# Patient Record
Sex: Male | Born: 1965 | Race: White | Hispanic: No | Marital: Single | Smoking: Former smoker
Health system: Southern US, Community
[De-identification: ages and names within clinical notes are randomized; demographics above are authoritative.]

## PROBLEM LIST (undated history)

## (undated) DIAGNOSIS — R569 Unspecified convulsions: Secondary | ICD-10-CM

## (undated) DIAGNOSIS — K259 Gastric ulcer, unspecified as acute or chronic, without hemorrhage or perforation: Secondary | ICD-10-CM

## (undated) DIAGNOSIS — F319 Bipolar disorder, unspecified: Secondary | ICD-10-CM

## (undated) DIAGNOSIS — F32A Depression, unspecified: Secondary | ICD-10-CM

## (undated) DIAGNOSIS — S0990XA Unspecified injury of head, initial encounter: Secondary | ICD-10-CM

## (undated) DIAGNOSIS — E46 Unspecified protein-calorie malnutrition: Secondary | ICD-10-CM

## (undated) DIAGNOSIS — N400 Enlarged prostate without lower urinary tract symptoms: Secondary | ICD-10-CM

## (undated) DIAGNOSIS — K279 Peptic ulcer, site unspecified, unspecified as acute or chronic, without hemorrhage or perforation: Secondary | ICD-10-CM

## (undated) DIAGNOSIS — F17200 Nicotine dependence, unspecified, uncomplicated: Secondary | ICD-10-CM

## (undated) DIAGNOSIS — F419 Anxiety disorder, unspecified: Secondary | ICD-10-CM

## (undated) DIAGNOSIS — F329 Major depressive disorder, single episode, unspecified: Secondary | ICD-10-CM

## (undated) DIAGNOSIS — F70 Mild intellectual disabilities: Secondary | ICD-10-CM

## (undated) DIAGNOSIS — F0789 Other personality and behavioral disorders due to known physiological condition: Secondary | ICD-10-CM

## (undated) DIAGNOSIS — K2210 Ulcer of esophagus without bleeding: Secondary | ICD-10-CM

## (undated) HISTORY — PX: GASTRECTOMY: SHX58

---
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
## 1997-10-03 ENCOUNTER — Emergency Department (HOSPITAL_COMMUNITY): Admission: EM | Admit: 1997-10-03 | Discharge: 1997-10-03 | Payer: Self-pay | Admitting: Emergency Medicine

## 1997-10-09 ENCOUNTER — Emergency Department (HOSPITAL_COMMUNITY): Admission: EM | Admit: 1997-10-09 | Discharge: 1997-10-09 | Payer: Self-pay | Admitting: *Deleted

## 1997-12-19 ENCOUNTER — Encounter: Admission: RE | Admit: 1997-12-19 | Discharge: 1998-03-19 | Payer: Self-pay | Admitting: Family Medicine

## 1999-12-05 ENCOUNTER — Encounter: Admission: RE | Admit: 1999-12-05 | Discharge: 1999-12-05 | Payer: Self-pay | Admitting: Neurology

## 1999-12-05 ENCOUNTER — Encounter: Payer: Self-pay | Admitting: Neurology

## 2001-03-01 ENCOUNTER — Emergency Department (HOSPITAL_COMMUNITY): Admission: EM | Admit: 2001-03-01 | Discharge: 2001-03-01 | Payer: Self-pay | Admitting: Emergency Medicine

## 2001-03-08 ENCOUNTER — Emergency Department (HOSPITAL_COMMUNITY): Admission: EM | Admit: 2001-03-08 | Discharge: 2001-03-08 | Payer: Self-pay | Admitting: *Deleted

## 2001-12-06 ENCOUNTER — Encounter: Payer: Self-pay | Admitting: Emergency Medicine

## 2001-12-06 ENCOUNTER — Emergency Department (HOSPITAL_COMMUNITY): Admission: EM | Admit: 2001-12-06 | Discharge: 2001-12-06 | Payer: Self-pay | Admitting: Emergency Medicine

## 2001-12-10 ENCOUNTER — Ambulatory Visit (HOSPITAL_COMMUNITY): Admission: RE | Admit: 2001-12-10 | Discharge: 2001-12-10 | Payer: Self-pay | Admitting: Plastic Surgery

## 2002-05-28 ENCOUNTER — Emergency Department (HOSPITAL_COMMUNITY): Admission: EM | Admit: 2002-05-28 | Discharge: 2002-05-28 | Payer: Self-pay | Admitting: Emergency Medicine

## 2002-05-28 ENCOUNTER — Encounter: Payer: Self-pay | Admitting: Emergency Medicine

## 2002-06-02 ENCOUNTER — Emergency Department (HOSPITAL_COMMUNITY): Admission: EM | Admit: 2002-06-02 | Discharge: 2002-06-02 | Payer: Self-pay | Admitting: Emergency Medicine

## 2004-05-13 ENCOUNTER — Ambulatory Visit: Payer: Self-pay | Admitting: Internal Medicine

## 2004-06-10 ENCOUNTER — Ambulatory Visit: Payer: Self-pay | Admitting: Internal Medicine

## 2004-08-13 ENCOUNTER — Ambulatory Visit: Payer: Self-pay | Admitting: Internal Medicine

## 2004-12-31 ENCOUNTER — Ambulatory Visit: Payer: Self-pay | Admitting: Internal Medicine

## 2005-02-14 ENCOUNTER — Ambulatory Visit: Payer: Self-pay | Admitting: Internal Medicine

## 2005-06-19 ENCOUNTER — Ambulatory Visit: Payer: Self-pay | Admitting: Internal Medicine

## 2005-06-19 DIAGNOSIS — Z8639 Personal history of other endocrine, nutritional and metabolic disease: Secondary | ICD-10-CM | POA: Insufficient documentation

## 2005-06-19 DIAGNOSIS — Z862 Personal history of diseases of the blood and blood-forming organs and certain disorders involving the immune mechanism: Secondary | ICD-10-CM

## 2005-07-06 ENCOUNTER — Emergency Department (HOSPITAL_COMMUNITY): Admission: EM | Admit: 2005-07-06 | Discharge: 2005-07-06 | Payer: Self-pay | Admitting: Emergency Medicine

## 2005-07-07 ENCOUNTER — Ambulatory Visit: Payer: Self-pay | Admitting: Internal Medicine

## 2005-07-07 ENCOUNTER — Inpatient Hospital Stay (HOSPITAL_COMMUNITY): Admission: EM | Admit: 2005-07-07 | Discharge: 2005-07-11 | Payer: Self-pay | Admitting: Emergency Medicine

## 2005-07-18 ENCOUNTER — Ambulatory Visit: Payer: Self-pay | Admitting: Internal Medicine

## 2005-07-30 ENCOUNTER — Ambulatory Visit: Payer: Self-pay | Admitting: Internal Medicine

## 2005-08-12 ENCOUNTER — Encounter: Admission: RE | Admit: 2005-08-12 | Discharge: 2005-11-10 | Payer: Self-pay | Admitting: Internal Medicine

## 2005-10-02 ENCOUNTER — Ambulatory Visit: Payer: Self-pay | Admitting: Internal Medicine

## 2005-10-16 ENCOUNTER — Ambulatory Visit (HOSPITAL_COMMUNITY): Admission: RE | Admit: 2005-10-16 | Discharge: 2005-10-16 | Payer: Self-pay | Admitting: Internal Medicine

## 2005-12-22 ENCOUNTER — Encounter: Admission: RE | Admit: 2005-12-22 | Discharge: 2006-01-20 | Payer: Self-pay | Admitting: Internal Medicine

## 2005-12-24 DIAGNOSIS — Z8719 Personal history of other diseases of the digestive system: Secondary | ICD-10-CM

## 2006-01-06 ENCOUNTER — Ambulatory Visit: Payer: Self-pay | Admitting: Internal Medicine

## 2006-01-09 ENCOUNTER — Ambulatory Visit: Payer: Self-pay | Admitting: Internal Medicine

## 2006-02-19 ENCOUNTER — Ambulatory Visit (HOSPITAL_COMMUNITY): Admission: RE | Admit: 2006-02-19 | Discharge: 2006-02-19 | Payer: Self-pay | Admitting: Internal Medicine

## 2006-03-23 ENCOUNTER — Emergency Department (HOSPITAL_COMMUNITY): Admission: EM | Admit: 2006-03-23 | Discharge: 2006-03-24 | Payer: Self-pay | Admitting: Emergency Medicine

## 2006-03-26 ENCOUNTER — Ambulatory Visit: Payer: Self-pay | Admitting: Internal Medicine

## 2006-06-29 ENCOUNTER — Ambulatory Visit (HOSPITAL_COMMUNITY): Admission: RE | Admit: 2006-06-29 | Discharge: 2006-06-29 | Payer: Self-pay | Admitting: Gastroenterology

## 2006-07-30 ENCOUNTER — Ambulatory Visit: Payer: Self-pay | Admitting: Internal Medicine

## 2006-08-05 ENCOUNTER — Ambulatory Visit (HOSPITAL_COMMUNITY): Admission: RE | Admit: 2006-08-05 | Discharge: 2006-08-05 | Payer: Self-pay | Admitting: Gastroenterology

## 2006-09-17 ENCOUNTER — Emergency Department (HOSPITAL_COMMUNITY): Admission: EM | Admit: 2006-09-17 | Discharge: 2006-09-17 | Payer: Self-pay | Admitting: Emergency Medicine

## 2006-09-21 DIAGNOSIS — S022XXA Fracture of nasal bones, initial encounter for closed fracture: Secondary | ICD-10-CM

## 2007-02-18 ENCOUNTER — Encounter (INDEPENDENT_AMBULATORY_CARE_PROVIDER_SITE_OTHER): Payer: Self-pay | Admitting: Family Medicine

## 2007-02-24 DIAGNOSIS — K210 Gastro-esophageal reflux disease with esophagitis: Secondary | ICD-10-CM

## 2007-03-26 ENCOUNTER — Ambulatory Visit: Payer: Self-pay | Admitting: Internal Medicine

## 2007-03-28 DIAGNOSIS — F329 Major depressive disorder, single episode, unspecified: Secondary | ICD-10-CM

## 2007-03-28 DIAGNOSIS — Z8711 Personal history of peptic ulcer disease: Secondary | ICD-10-CM

## 2007-03-28 DIAGNOSIS — H5010 Unspecified exotropia: Secondary | ICD-10-CM | POA: Insufficient documentation

## 2007-03-30 LAB — CONVERTED CEMR LAB
Albumin: 4 g/dL (ref 3.5–5.2)
Alkaline Phosphatase: 81 units/L (ref 39–117)
BUN: 21 mg/dL (ref 6–23)
CO2: 29 meq/L (ref 19–32)
Carbamazepine Lvl: 7.2 ug/mL (ref 4.0–12.0)
Eosinophils Absolute: 0.4 10*3/uL (ref 0.0–0.7)
Eosinophils Relative: 7 % — ABNORMAL HIGH (ref 0–5)
Glucose, Bld: 95 mg/dL (ref 70–99)
HCT: 45.4 % (ref 39.0–52.0)
Hemoglobin: 14.8 g/dL (ref 13.0–17.0)
Lymphocytes Relative: 39 % (ref 12–46)
Lymphs Abs: 2.5 10*3/uL (ref 0.7–3.3)
MCV: 97.8 fL (ref 78.0–100.0)
Monocytes Relative: 13 % — ABNORMAL HIGH (ref 3–11)
Potassium: 4.2 meq/L (ref 3.5–5.3)
RBC: 4.64 M/uL (ref 4.22–5.81)
Total Bilirubin: 0.2 mg/dL — ABNORMAL LOW (ref 0.3–1.2)
Valproic Acid Lvl: 48.4 ug/mL — ABNORMAL LOW (ref 50.0–100.0)
WBC: 6.3 10*3/uL (ref 4.0–10.5)

## 2007-04-02 ENCOUNTER — Telehealth (INDEPENDENT_AMBULATORY_CARE_PROVIDER_SITE_OTHER): Payer: Self-pay | Admitting: Internal Medicine

## 2007-04-02 DIAGNOSIS — R269 Unspecified abnormalities of gait and mobility: Secondary | ICD-10-CM | POA: Insufficient documentation

## 2007-04-08 ENCOUNTER — Encounter (INDEPENDENT_AMBULATORY_CARE_PROVIDER_SITE_OTHER): Payer: Self-pay | Admitting: Internal Medicine

## 2007-04-13 ENCOUNTER — Encounter: Payer: Self-pay | Admitting: Internal Medicine

## 2007-05-06 ENCOUNTER — Encounter (INDEPENDENT_AMBULATORY_CARE_PROVIDER_SITE_OTHER): Payer: Self-pay | Admitting: Internal Medicine

## 2007-06-25 ENCOUNTER — Encounter (INDEPENDENT_AMBULATORY_CARE_PROVIDER_SITE_OTHER): Payer: Self-pay | Admitting: Internal Medicine

## 2007-09-06 ENCOUNTER — Encounter (INDEPENDENT_AMBULATORY_CARE_PROVIDER_SITE_OTHER): Payer: Self-pay | Admitting: Internal Medicine

## 2007-09-07 ENCOUNTER — Emergency Department (HOSPITAL_COMMUNITY): Admission: EM | Admit: 2007-09-07 | Discharge: 2007-09-07 | Payer: Self-pay | Admitting: Family Medicine

## 2007-10-25 ENCOUNTER — Encounter (INDEPENDENT_AMBULATORY_CARE_PROVIDER_SITE_OTHER): Payer: Self-pay | Admitting: Internal Medicine

## 2007-10-26 ENCOUNTER — Emergency Department (HOSPITAL_COMMUNITY): Admission: EM | Admit: 2007-10-26 | Discharge: 2007-10-26 | Payer: Self-pay | Admitting: Emergency Medicine

## 2007-12-27 ENCOUNTER — Encounter (INDEPENDENT_AMBULATORY_CARE_PROVIDER_SITE_OTHER): Payer: Self-pay | Admitting: Internal Medicine

## 2008-01-07 ENCOUNTER — Encounter (INDEPENDENT_AMBULATORY_CARE_PROVIDER_SITE_OTHER): Payer: Self-pay | Admitting: Internal Medicine

## 2008-01-24 ENCOUNTER — Encounter (INDEPENDENT_AMBULATORY_CARE_PROVIDER_SITE_OTHER): Payer: Self-pay | Admitting: Internal Medicine

## 2008-02-25 ENCOUNTER — Encounter (INDEPENDENT_AMBULATORY_CARE_PROVIDER_SITE_OTHER): Payer: Self-pay | Admitting: Internal Medicine

## 2008-03-07 ENCOUNTER — Ambulatory Visit: Payer: Self-pay | Admitting: Internal Medicine

## 2008-03-07 DIAGNOSIS — M4000 Postural kyphosis, site unspecified: Secondary | ICD-10-CM | POA: Insufficient documentation

## 2008-03-07 DIAGNOSIS — B369 Superficial mycosis, unspecified: Secondary | ICD-10-CM | POA: Insufficient documentation

## 2008-03-07 DIAGNOSIS — N39498 Other specified urinary incontinence: Secondary | ICD-10-CM

## 2008-03-07 DIAGNOSIS — N401 Enlarged prostate with lower urinary tract symptoms: Secondary | ICD-10-CM

## 2008-03-07 LAB — CONVERTED CEMR LAB
Albumin: 3.9 g/dL (ref 3.5–5.2)
BUN: 13 mg/dL (ref 6–23)
Blood in Urine, dipstick: NEGATIVE
CO2: 28 meq/L (ref 19–32)
Calcium: 9.2 mg/dL (ref 8.4–10.5)
Chloride: 104 meq/L (ref 96–112)
Eosinophils Absolute: 0.3 10*3/uL (ref 0.0–0.7)
Glucose, Bld: 82 mg/dL (ref 70–99)
Glucose, Urine, Semiquant: NEGATIVE
Lymphocytes Relative: 35 % (ref 12–46)
Lymphs Abs: 2.3 10*3/uL (ref 0.7–4.0)
MCV: 100.9 fL — ABNORMAL HIGH (ref 78.0–100.0)
Monocytes Relative: 12 % (ref 3–12)
Neutro Abs: 3.2 10*3/uL (ref 1.7–7.7)
Neutrophils Relative %: 48 % (ref 43–77)
Nitrite: NEGATIVE
Platelets: 277 10*3/uL (ref 150–400)
Potassium: 4.9 meq/L (ref 3.5–5.3)
RBC: 4.29 M/uL (ref 4.22–5.81)
Sodium: 143 meq/L (ref 135–145)
Total Protein: 6.6 g/dL (ref 6.0–8.3)
WBC: 6.6 10*3/uL (ref 4.0–10.5)
pH: 6

## 2008-03-08 ENCOUNTER — Encounter (INDEPENDENT_AMBULATORY_CARE_PROVIDER_SITE_OTHER): Payer: Self-pay | Admitting: Internal Medicine

## 2008-03-12 ENCOUNTER — Encounter (INDEPENDENT_AMBULATORY_CARE_PROVIDER_SITE_OTHER): Payer: Self-pay | Admitting: Internal Medicine

## 2008-03-15 ENCOUNTER — Encounter (INDEPENDENT_AMBULATORY_CARE_PROVIDER_SITE_OTHER): Payer: Self-pay | Admitting: Internal Medicine

## 2008-03-15 ENCOUNTER — Ambulatory Visit (HOSPITAL_COMMUNITY): Admission: RE | Admit: 2008-03-15 | Discharge: 2008-03-15 | Payer: Self-pay | Admitting: Internal Medicine

## 2008-03-15 DIAGNOSIS — M810 Age-related osteoporosis without current pathological fracture: Secondary | ICD-10-CM | POA: Insufficient documentation

## 2008-04-06 ENCOUNTER — Encounter (INDEPENDENT_AMBULATORY_CARE_PROVIDER_SITE_OTHER): Payer: Self-pay | Admitting: Internal Medicine

## 2008-05-01 ENCOUNTER — Inpatient Hospital Stay (HOSPITAL_COMMUNITY): Admission: EM | Admit: 2008-05-01 | Discharge: 2008-05-08 | Payer: Self-pay | Admitting: Emergency Medicine

## 2008-05-01 ENCOUNTER — Encounter (INDEPENDENT_AMBULATORY_CARE_PROVIDER_SITE_OTHER): Payer: Self-pay | Admitting: Internal Medicine

## 2008-05-09 ENCOUNTER — Telehealth (INDEPENDENT_AMBULATORY_CARE_PROVIDER_SITE_OTHER): Payer: Self-pay | Admitting: Internal Medicine

## 2008-05-30 ENCOUNTER — Emergency Department (HOSPITAL_COMMUNITY): Admission: EM | Admit: 2008-05-30 | Discharge: 2008-05-30 | Payer: Self-pay | Admitting: Family Medicine

## 2008-06-01 ENCOUNTER — Emergency Department (HOSPITAL_COMMUNITY): Admission: EM | Admit: 2008-06-01 | Discharge: 2008-06-01 | Payer: Self-pay | Admitting: Emergency Medicine

## 2008-06-19 ENCOUNTER — Encounter (INDEPENDENT_AMBULATORY_CARE_PROVIDER_SITE_OTHER): Payer: Self-pay | Admitting: Internal Medicine

## 2008-07-10 ENCOUNTER — Encounter (INDEPENDENT_AMBULATORY_CARE_PROVIDER_SITE_OTHER): Payer: Self-pay | Admitting: Internal Medicine

## 2008-08-10 ENCOUNTER — Encounter (INDEPENDENT_AMBULATORY_CARE_PROVIDER_SITE_OTHER): Payer: Self-pay | Admitting: Internal Medicine

## 2008-10-14 ENCOUNTER — Emergency Department (HOSPITAL_COMMUNITY): Admission: EM | Admit: 2008-10-14 | Discharge: 2008-10-14 | Payer: Self-pay | Admitting: Emergency Medicine

## 2008-10-15 ENCOUNTER — Emergency Department (HOSPITAL_COMMUNITY): Admission: EM | Admit: 2008-10-15 | Discharge: 2008-10-15 | Payer: Self-pay | Admitting: Emergency Medicine

## 2008-10-26 ENCOUNTER — Telehealth (INDEPENDENT_AMBULATORY_CARE_PROVIDER_SITE_OTHER): Payer: Self-pay | Admitting: Internal Medicine

## 2008-11-14 ENCOUNTER — Ambulatory Visit: Payer: Self-pay | Admitting: Internal Medicine

## 2008-11-14 LAB — CONVERTED CEMR LAB
BUN: 9 mg/dL (ref 6–23)
Chloride: 107 meq/L (ref 96–112)
Magnesium: 2 mg/dL (ref 1.5–2.5)
Phosphorus: 3.5 mg/dL (ref 2.3–4.6)
Potassium: 4.1 meq/L (ref 3.5–5.3)
Sodium: 142 meq/L (ref 135–145)
Vit D, 25-Hydroxy: 30 ng/mL (ref 30–89)

## 2008-11-16 ENCOUNTER — Telehealth (INDEPENDENT_AMBULATORY_CARE_PROVIDER_SITE_OTHER): Payer: Self-pay | Admitting: Internal Medicine

## 2008-12-11 ENCOUNTER — Encounter (INDEPENDENT_AMBULATORY_CARE_PROVIDER_SITE_OTHER): Payer: Self-pay | Admitting: Internal Medicine

## 2008-12-18 ENCOUNTER — Encounter (INDEPENDENT_AMBULATORY_CARE_PROVIDER_SITE_OTHER): Payer: Self-pay | Admitting: Internal Medicine

## 2009-01-30 ENCOUNTER — Encounter (INDEPENDENT_AMBULATORY_CARE_PROVIDER_SITE_OTHER): Payer: Self-pay | Admitting: Internal Medicine

## 2009-02-09 ENCOUNTER — Encounter (INDEPENDENT_AMBULATORY_CARE_PROVIDER_SITE_OTHER): Payer: Self-pay | Admitting: Internal Medicine

## 2009-02-14 ENCOUNTER — Encounter (INDEPENDENT_AMBULATORY_CARE_PROVIDER_SITE_OTHER): Payer: Self-pay | Admitting: Internal Medicine

## 2009-04-10 ENCOUNTER — Encounter (INDEPENDENT_AMBULATORY_CARE_PROVIDER_SITE_OTHER): Payer: Self-pay | Admitting: Internal Medicine

## 2009-04-12 ENCOUNTER — Encounter (INDEPENDENT_AMBULATORY_CARE_PROVIDER_SITE_OTHER): Payer: Self-pay | Admitting: Internal Medicine

## 2009-04-24 ENCOUNTER — Telehealth (INDEPENDENT_AMBULATORY_CARE_PROVIDER_SITE_OTHER): Payer: Self-pay | Admitting: Internal Medicine

## 2009-04-26 ENCOUNTER — Encounter (INDEPENDENT_AMBULATORY_CARE_PROVIDER_SITE_OTHER): Payer: Self-pay | Admitting: Internal Medicine

## 2009-04-30 ENCOUNTER — Encounter (INDEPENDENT_AMBULATORY_CARE_PROVIDER_SITE_OTHER): Payer: Self-pay | Admitting: Internal Medicine

## 2009-06-07 ENCOUNTER — Ambulatory Visit: Payer: Self-pay | Admitting: Internal Medicine

## 2009-06-07 DIAGNOSIS — D179 Benign lipomatous neoplasm, unspecified: Secondary | ICD-10-CM | POA: Insufficient documentation

## 2009-06-08 ENCOUNTER — Encounter (INDEPENDENT_AMBULATORY_CARE_PROVIDER_SITE_OTHER): Payer: Self-pay | Admitting: Internal Medicine

## 2009-06-11 ENCOUNTER — Telehealth (INDEPENDENT_AMBULATORY_CARE_PROVIDER_SITE_OTHER): Payer: Self-pay | Admitting: Internal Medicine

## 2009-06-13 ENCOUNTER — Encounter (INDEPENDENT_AMBULATORY_CARE_PROVIDER_SITE_OTHER): Payer: Self-pay | Admitting: Internal Medicine

## 2009-06-13 LAB — CONVERTED CEMR LAB
ALT: 17 units/L (ref 0–53)
Albumin: 4.4 g/dL (ref 3.5–5.2)
Alkaline Phosphatase: 165 units/L — ABNORMAL HIGH (ref 39–117)
Basophils Absolute: 0 10*3/uL (ref 0.0–0.1)
Basophils Relative: 1 % (ref 0–1)
Carbamazepine Lvl: 8.8 ug/mL (ref 4.0–12.0)
Hemoglobin: 12.6 g/dL — ABNORMAL LOW (ref 13.0–17.0)
LDL Cholesterol: 106 mg/dL — ABNORMAL HIGH (ref 0–99)
Lymphocytes Relative: 26 % (ref 12–46)
MCHC: 30.1 g/dL (ref 30.0–36.0)
Monocytes Absolute: 0.6 10*3/uL (ref 0.1–1.0)
Monocytes Relative: 11 % (ref 3–12)
Neutro Abs: 3.3 10*3/uL (ref 1.7–7.7)
Neutrophils Relative %: 60 % (ref 43–77)
Potassium: 4.4 meq/L (ref 3.5–5.3)
RBC: 4.84 M/uL (ref 4.22–5.81)
RDW: 16.2 % — ABNORMAL HIGH (ref 11.5–15.5)
Sodium: 143 meq/L (ref 135–145)
TSH: 1.089 microintl units/mL (ref 0.350–4.500)
Total Bilirubin: 0.2 mg/dL — ABNORMAL LOW (ref 0.3–1.2)
Total Protein: 7.2 g/dL (ref 6.0–8.3)
VLDL: 16 mg/dL (ref 0–40)

## 2009-06-14 ENCOUNTER — Ambulatory Visit (HOSPITAL_COMMUNITY): Admission: RE | Admit: 2009-06-14 | Discharge: 2009-06-14 | Payer: Self-pay | Admitting: Internal Medicine

## 2009-06-14 ENCOUNTER — Encounter (INDEPENDENT_AMBULATORY_CARE_PROVIDER_SITE_OTHER): Payer: Self-pay | Admitting: Internal Medicine

## 2009-06-18 ENCOUNTER — Encounter (INDEPENDENT_AMBULATORY_CARE_PROVIDER_SITE_OTHER): Payer: Self-pay | Admitting: Internal Medicine

## 2009-06-20 ENCOUNTER — Encounter (INDEPENDENT_AMBULATORY_CARE_PROVIDER_SITE_OTHER): Payer: Self-pay | Admitting: Internal Medicine

## 2009-06-22 ENCOUNTER — Telehealth (INDEPENDENT_AMBULATORY_CARE_PROVIDER_SITE_OTHER): Payer: Self-pay | Admitting: Internal Medicine

## 2009-06-26 ENCOUNTER — Encounter (INDEPENDENT_AMBULATORY_CARE_PROVIDER_SITE_OTHER): Payer: Self-pay | Admitting: Internal Medicine

## 2009-06-29 ENCOUNTER — Encounter: Admission: RE | Admit: 2009-06-29 | Discharge: 2009-06-29 | Payer: Self-pay | Admitting: Neurology

## 2009-07-03 LAB — CONVERTED CEMR LAB: Valproic Acid Lvl: <1 ug/mL — ABNORMAL LOW (ref 50.0–100.0)

## 2009-07-06 ENCOUNTER — Encounter (INDEPENDENT_AMBULATORY_CARE_PROVIDER_SITE_OTHER): Payer: Self-pay | Admitting: Internal Medicine

## 2009-07-10 ENCOUNTER — Encounter: Payer: Self-pay | Admitting: Physician Assistant

## 2009-07-25 ENCOUNTER — Encounter (INDEPENDENT_AMBULATORY_CARE_PROVIDER_SITE_OTHER): Payer: Self-pay | Admitting: Internal Medicine

## 2009-07-26 ENCOUNTER — Encounter: Admission: RE | Admit: 2009-07-26 | Discharge: 2009-07-27 | Payer: Self-pay | Admitting: Internal Medicine

## 2009-07-31 ENCOUNTER — Ambulatory Visit: Payer: Self-pay | Admitting: Internal Medicine

## 2009-07-31 DIAGNOSIS — K5289 Other specified noninfective gastroenteritis and colitis: Secondary | ICD-10-CM

## 2009-07-31 LAB — CONVERTED CEMR LAB
Glucose, Urine, Semiquant: NEGATIVE
Nitrite: NEGATIVE
Specific Gravity, Urine: 1.015
WBC Urine, dipstick: NEGATIVE
pH: 7

## 2009-08-16 ENCOUNTER — Encounter (INDEPENDENT_AMBULATORY_CARE_PROVIDER_SITE_OTHER): Payer: Self-pay | Admitting: Internal Medicine

## 2009-08-31 ENCOUNTER — Encounter (INDEPENDENT_AMBULATORY_CARE_PROVIDER_SITE_OTHER): Payer: Self-pay | Admitting: Internal Medicine

## 2009-09-12 ENCOUNTER — Telehealth (INDEPENDENT_AMBULATORY_CARE_PROVIDER_SITE_OTHER): Payer: Self-pay | Admitting: Internal Medicine

## 2009-09-21 ENCOUNTER — Telehealth (INDEPENDENT_AMBULATORY_CARE_PROVIDER_SITE_OTHER): Payer: Self-pay | Admitting: Internal Medicine

## 2009-09-23 ENCOUNTER — Encounter (INDEPENDENT_AMBULATORY_CARE_PROVIDER_SITE_OTHER): Payer: Self-pay | Admitting: Internal Medicine

## 2009-09-25 ENCOUNTER — Telehealth (INDEPENDENT_AMBULATORY_CARE_PROVIDER_SITE_OTHER): Payer: Self-pay | Admitting: *Deleted

## 2009-09-26 ENCOUNTER — Encounter (INDEPENDENT_AMBULATORY_CARE_PROVIDER_SITE_OTHER): Payer: Self-pay | Admitting: Internal Medicine

## 2009-10-11 ENCOUNTER — Encounter (INDEPENDENT_AMBULATORY_CARE_PROVIDER_SITE_OTHER): Payer: Self-pay | Admitting: Internal Medicine

## 2009-11-07 ENCOUNTER — Encounter (INDEPENDENT_AMBULATORY_CARE_PROVIDER_SITE_OTHER): Payer: Self-pay | Admitting: Internal Medicine

## 2009-11-14 ENCOUNTER — Encounter (INDEPENDENT_AMBULATORY_CARE_PROVIDER_SITE_OTHER): Payer: Self-pay | Admitting: Internal Medicine

## 2009-11-27 ENCOUNTER — Emergency Department (HOSPITAL_BASED_OUTPATIENT_CLINIC_OR_DEPARTMENT_OTHER): Admission: EM | Admit: 2009-11-27 | Discharge: 2009-11-27 | Payer: Self-pay | Admitting: Emergency Medicine

## 2009-11-27 ENCOUNTER — Ambulatory Visit: Payer: Self-pay | Admitting: Radiology

## 2010-01-16 ENCOUNTER — Emergency Department (HOSPITAL_BASED_OUTPATIENT_CLINIC_OR_DEPARTMENT_OTHER): Admission: EM | Admit: 2010-01-16 | Discharge: 2010-01-16 | Payer: Self-pay | Admitting: Emergency Medicine

## 2010-01-16 ENCOUNTER — Ambulatory Visit: Payer: Self-pay | Admitting: Diagnostic Radiology

## 2010-01-25 ENCOUNTER — Encounter (INDEPENDENT_AMBULATORY_CARE_PROVIDER_SITE_OTHER): Payer: Self-pay | Admitting: Internal Medicine

## 2010-01-29 ENCOUNTER — Encounter (INDEPENDENT_AMBULATORY_CARE_PROVIDER_SITE_OTHER): Payer: Self-pay | Admitting: Internal Medicine

## 2010-03-09 ENCOUNTER — Emergency Department (HOSPITAL_COMMUNITY)
Admission: EM | Admit: 2010-03-09 | Discharge: 2010-03-09 | Payer: Self-pay | Source: Home / Self Care | Admitting: Emergency Medicine

## 2010-03-20 ENCOUNTER — Encounter (INDEPENDENT_AMBULATORY_CARE_PROVIDER_SITE_OTHER): Payer: Self-pay | Admitting: Internal Medicine

## 2010-05-05 ENCOUNTER — Emergency Department (HOSPITAL_COMMUNITY)
Admission: EM | Admit: 2010-05-05 | Discharge: 2010-05-05 | Payer: Self-pay | Source: Home / Self Care | Admitting: Emergency Medicine

## 2010-05-27 ENCOUNTER — Telehealth (INDEPENDENT_AMBULATORY_CARE_PROVIDER_SITE_OTHER): Payer: Self-pay | Admitting: Internal Medicine

## 2010-05-30 ENCOUNTER — Emergency Department (HOSPITAL_BASED_OUTPATIENT_CLINIC_OR_DEPARTMENT_OTHER)
Admission: EM | Admit: 2010-05-30 | Discharge: 2010-05-30 | Payer: Self-pay | Source: Home / Self Care | Admitting: Emergency Medicine

## 2010-05-30 ENCOUNTER — Encounter (INDEPENDENT_AMBULATORY_CARE_PROVIDER_SITE_OTHER): Payer: Self-pay | Admitting: Internal Medicine

## 2010-05-30 LAB — DIFFERENTIAL
Basophils Absolute: 0 10*3/uL (ref 0.0–0.1)
Basophils Relative: 1 % (ref 0–1)
Eosinophils Absolute: 0.1 10*3/uL (ref 0.0–0.7)
Eosinophils Relative: 2 % (ref 0–5)
Lymphocytes Relative: 26 % (ref 12–46)
Lymphs Abs: 1.5 10*3/uL (ref 0.7–4.0)
Monocytes Absolute: 0.9 10*3/uL (ref 0.1–1.0)
Monocytes Relative: 16 % — ABNORMAL HIGH (ref 3–12)
Neutro Abs: 3.1 10*3/uL (ref 1.7–7.7)
Neutrophils Relative %: 56 % (ref 43–77)

## 2010-05-30 LAB — CBC
HCT: 33.8 % — ABNORMAL LOW (ref 39.0–52.0)
Hemoglobin: 10.9 g/dL — ABNORMAL LOW (ref 13.0–17.0)
MCH: 27.6 pg (ref 26.0–34.0)
MCHC: 32.2 g/dL (ref 30.0–36.0)
MCV: 85.6 fL (ref 78.0–100.0)
Platelets: 351 10*3/uL (ref 150–400)
RBC: 3.95 MIL/uL — ABNORMAL LOW (ref 4.22–5.81)
RDW: 14.4 % (ref 11.5–15.5)
WBC: 5.6 10*3/uL (ref 4.0–10.5)

## 2010-05-30 LAB — BASIC METABOLIC PANEL
BUN: 10 mg/dL (ref 6–23)
CO2: 29 mEq/L (ref 19–32)
Calcium: 9 mg/dL (ref 8.4–10.5)
Chloride: 103 mEq/L (ref 96–112)
Creatinine, Ser: 0.7 mg/dL (ref 0.4–1.5)
GFR calc Af Amer: >60 mL/min (ref >60)
GFR calc non Af Amer: >60 mL/min (ref >60)
Glucose, Bld: 97 mg/dL (ref 70–99)
Potassium: 4.7 mEq/L (ref 3.5–5.1)
Sodium: 143 mEq/L (ref 135–145)

## 2010-05-30 LAB — URINALYSIS, ROUTINE W REFLEX MICROSCOPIC
Bilirubin Urine: NEGATIVE
Hemoglobin, Urine: NEGATIVE
Ketones, ur: NEGATIVE mg/dL
Nitrite: NEGATIVE
Protein, ur: NEGATIVE mg/dL
Specific Gravity, Urine: 1.01 (ref 1.005–1.030)
Urine Glucose, Fasting: NEGATIVE mg/dL
Urobilinogen, UA: 0.2 mg/dL (ref 0.0–1.0)
pH: 6.5 (ref 5.0–8.0)

## 2010-05-30 LAB — CARBAMAZEPINE LEVEL, TOTAL: Carbamazepine Lvl: 10.6 ug/mL (ref 4.0–12.0)

## 2010-06-07 ENCOUNTER — Encounter (INDEPENDENT_AMBULATORY_CARE_PROVIDER_SITE_OTHER): Payer: Self-pay | Admitting: Internal Medicine

## 2010-06-16 ENCOUNTER — Encounter: Payer: Self-pay | Admitting: Internal Medicine

## 2010-06-16 ENCOUNTER — Encounter: Payer: Self-pay | Admitting: Gastroenterology

## 2010-06-25 NOTE — Progress Notes (Signed)
Summary: RX NEEDS TO BE REWRITTEN   Phone Note Other Incoming Call back at [phone]   Summary of Call: Trinette Vera PT Caller: Dorathy Daft  Summary of Call: Binyamin Nelis PT. MS LAUDER JUST CALLED AND SAYS THAT [facility] FOR HIS CALCIUM NEEDS TO BE WRITTEN CALCIUM CITRATE +D 630 MG TAKE 1 TABLET TWICE DAILY. FAX NUMBER IS [phone] Initial call taken by: Leodis Rains,  June 22, 2009 3:22 PM  Follow-up for Phone Call        [facility] will hand write rx. Follow-up by: Vesta Mixer CMA,  June 22, 2009 5:04 PM  Additional Follow-up for Phone Call Additional follow up Details #1::        Done as a written Rx--unable to bring up the Rx they have available Additional Follow-up by: [name] MD,  June 22, 2009 5:19 PM

## 2010-06-25 NOTE — Letter (Signed)
Summary: [facility]//WHEEL CHAIR EVAL.  [facility]//WHEEL CHAIR EVAL.   Imported By: Arta Bruce 07/06/2009 10:07:14  _____________________________________________________________________  External Attachment:    Type:   Image     Comment:   External Document

## 2010-06-25 NOTE — Progress Notes (Signed)
Summary: medication clarification  Phone Note Call from Patient Call back at [phone]   Summary of Call: [facility] called stating that she has been to several pharmacies and can not find the calcium 600mg  that was written on physicians order to caregiver. It does not come in the strength so she needs clarification or needs a different order since they have to give patient medication on order. Dorathy Daft...[phone])  Ms Romilda Joy also states that she needs a hard copy of rx for the patients chart per the state.... Initial call taken by: Mikey College CMA,  June 11, 2009 10:50 AM  Follow-up for Phone Call        What strength is she able to find? Follow-up by: [name] MD,  June 13, 2009 12:32 PM  Additional Follow-up for Phone Call Additional follow up Details #1::        left msg on voice mail for Alona Bene to call back. Additional Follow-up by: Vesta Mixer CMA,  June 13, 2009 5:00 PM    Additional Follow-up for Phone Call Additional follow up Details #2::    Left message on answering machine for Alona Bene to return call. Follow-up by: Vesta Mixer CMA,  June 18, 2009 3:18 PM  Additional Follow-up for Phone Call Additional follow up Details #3:: Details for Additional Follow-up Action Taken: Caregiver called and she said the closest she could find was 630mg  They will need new rx faxed for their records also.  Would also like copy of bone density.  Fax number is [phone].......... Tiffany McCoy CMA  June 19, 2009 10:04 AM   Prescriptions: CALCIUM CITRATE + D 315-200 MG-UNIT TABS (CALCIUM CITRATE-VITAMIN D) 2 tabs by mouth two times a day  #120 x 11   Entered and Authorized by:   [name] MD   Signed by:   [name] MD on 06/21/2009   Method used:   Print then Mail to Patient   RxID:   [id number]

## 2010-06-25 NOTE — Letter (Signed)
Summary: [facility]   Imported By: Arta Bruce 11/15/2009 10:49:53  _____________________________________________________________________  External Attachment:    Type:   Image     Comment:   External Document

## 2010-06-25 NOTE — Letter (Signed)
Summary: [facility]//MAILED  [facility]//MAILED   Imported By: Arta Bruce 09/26/2009 10:14:17  _____________________________________________________________________  External Attachment:    Type:   Image     Comment:   External Document

## 2010-06-25 NOTE — Miscellaneous (Signed)
  Clinical Lists Changes  Observations: Added new observation of BONE DENSITY: T score, AP spine:  -4.5, T score total left femur:  -2/6, T score total right femur:  -2.7 (06/14/2009 13:03)

## 2010-06-25 NOTE — Progress Notes (Signed)
Summary: [facility] FORMS TO BE FILLED OUT  Phone Note Other Incoming Call back at [phone]   Caller: SHARONO FROM [facility] Summary of Call: SHARON, WITH [facility] CHECKING ON [facility]. THEY NEED BACK AS SOON AS POSSIBLE. THE FORM IS IN A RED OUTGUIDE ON YOUR REFILL SHELF. Initial call taken by: Leodis Rains,  September 12, 2009 10:27 AM  Follow-up for Phone Call        Called [facility]--need to know how much of the Thick It they use each day or week or month (the latter would be the best)  to fill out form Follow-up by: [name] MD,  September 21, 2009 2:15 PM  Additional Follow-up for Phone Call Additional follow up Details #1::        Was able to find the info I was calling about in a previous form--not sure why filling out redundant forms.

## 2010-06-25 NOTE — Letter (Signed)
Summary: STANDING PHYSICIAN ORDERS  STANDING PHYSICIAN ORDERS   Imported By: Arta Bruce 09/28/2009 15:42:07  _____________________________________________________________________  External Attachment:    Type:   Image     Comment:   External Document

## 2010-06-25 NOTE — Letter (Signed)
Summary: [facility]//04/02/09 TO 05/31/09  [facility]//04/02/09 TO 05/31/09   Imported By: Silvio Pate Stanis[facility]cyk 06/11/2009 11:35:01  _____________________________________________________________________  External Attachment:    Type:   Image     Comment:   External Document

## 2010-06-25 NOTE — Letter (Signed)
Summary: [facility]/WHEELCHAIR ASSESSMENT  [facility]/WHEELCHAIR ASSESSMENT   Imported By: Arta Bruce 06/15/2009 11:36:53  _____________________________________________________________________  External Attachment:    Type:   Image     Comment:   External Document

## 2010-06-25 NOTE — Progress Notes (Signed)
Summary: med refill  Phone Note Call from Patient   Summary of Call: [name] from [facility] called requesting refill for [name] for Flomax be sent to [facility] [phone]  Fax [phone].  Pt will be transferring to [facility] in May, so just needs one months worth. Initial call taken by: Vesta Mixer CMA,  September 21, 2009 11:13 AM    Prescriptions: FLOMAX 0.4 MG XR24H-CAP (TAMSULOSIN HCL) 1 tab by mouth q hs  #0 x 0   Entered and Authorized by:   [name] MD   Signed by:   [name] MD on 09/21/2009   Method used:   Faxed to ...       [facility] (retail)       [address]       [place], [place]  [address]       Ph: [phone]       Fax: [phone]   RxID:   [id number]

## 2010-06-25 NOTE — Letter (Signed)
Summary: Generic Letter  [facility]  [address] [place], [place] [address]   Phone: [phone]  Fax: [phone]    11/14/2009  Re:  [name]      [address]      [place], [place]  [address]  To Whom It May Concern:  [name] is my patient at [facility].  He has chronic problems with silent aspiration, GERD, recurrent vomiting and requires Ensure for nutritional supplementation as well as Thick it to avoid aspiration.  He weighs generally 133-135 and is 68 inches.  Because of his GI problems, he does better nutritionally with adding Ensure to his diet.  I would be happy to send his entire chart for your evaluation if this information is not satisfactory to support his need for Ensure.        Sincerely,   [name] MD

## 2010-06-25 NOTE — Letter (Signed)
Summary: [facility]   Imported By: Arta Bruce 09/24/2009 11:55:20  _____________________________________________________________________  External Attachment:    Type:   Image     Comment:   External Document

## 2010-06-25 NOTE — Letter (Signed)
Summary: [facility]//FAXED  [facility]//FAXED   Imported By: Arta Bruce 01/29/2010 11:44:11  _____________________________________________________________________  External Attachment:    Type:   Image     Comment:   External Document

## 2010-06-25 NOTE — Letter (Signed)
Summary: DURABLE MEDICAL EQUIPMENT  DURABLE MEDICAL EQUIPMENT   Imported By: Arta Bruce 10/11/2009 11:58:33  _____________________________________________________________________  External Attachment:    Type:   Image     Comment:   External Document

## 2010-06-25 NOTE — Assessment & Plan Note (Signed)
Summary: 2 MO F/U & PT HAVING N & V////RJP   Vital Signs:  Patient profile:   45 year old male Temp:     97.3 degrees F Pulse rate:   103 / minute Pulse rhythm:   regular Resp:     20 per minute BP sitting:   110 / 71  (left arm) Cuff size:   regular  Vitals Entered By: Vesta Mixer CMA (July 31, 2009 3:31 PM) CC: Since last Thursday has been throwing up about 2 hours after dinner.  Then Sunday after breafast.  Last night was not as bad and no vomitting today. Is Patient Diabetic? No Pain Assessment Patient in pain? no       Does patient need assistance? Ambulation Impaired:Risk for fall   CC:  Since last Thursday has been throwing up about 2 hours after dinner.  Then Sunday after breafast.  Last night was not as bad and no vomitting today.Marland Kitchen  History of Present Illness: 1.  Vomiting again:  [name] states he generally vomits once weekly.  Last week, on Thursday, vomiting about 2 hours after in bed.  3 days later, vomited after meals.  Vomited last time--last night during sleeping hours.  Complained of epigastric area 3 days ago when also started vomiting after meals.  No vomting today.  No burning with urination.  Continent of urine now.  No constipation or diarrhea.  Last BM was 4 days ago--soft.  No melena or hematochezia.  Was complaining of being cold end of week and over the weekend.  No fevers.  Vomit is of partially digested food.  Drinks watered down Diet Coke.  Allergies (verified): 1)  * Pineapple  Physical Exam  General:  Looks great--playful and happy Lungs:  Normal respiratory effort, chest expands symmetrically. Lungs are clear to auscultation, no crackles or wheezes. Heart:  Normal rate and regular rhythm. S1 and S2 normal without gallop, murmur, click, rub or other extra sounds. Abdomen:  Bowel sounds positive,abdomen soft and non-tender without masses, organomegaly or hernias noted.   Impression & Recommendations:  Problem # 1:  GASTROENTERITIS  (ICD-558.9)  Probable stomach virus--doing well now, but to call if symptoms recur  Orders: UA Dipstick w/o Micro (manual) (52841)  Complete Medication List: 1)  Carbatrol 300 Mg Cp12 (Carbamazepine) .Marland Kitchen.. 1 cap by mouth two times a day 2)  Oxybutynin Chloride 15 Mg Tb24 (Oxybutynin chloride) .Marland Kitchen.. 1 tab by mouth two times a day 3)  Omeprazole 20 Mg Tbec (Omeprazole) .Marland Kitchen.. 1 tab by mouth two times a day 4)  Fluoxetine Hcl 20 Mg Caps (Fluoxetine hcl) .Marland Kitchen.. 1 cap by mouth daily 5)  Clonazepam Odt 0.5 Mg Tbdp (Clonazepam) .Marland Kitchen.. 1 tab by mouth two times a day prn 6)  Thick-it Powd (Starch-maltodextrin) .... Honey consistency with liquids po 7)  Ensure Liqd (Nutritional supplements) .Marland Kitchen.. 1 can by mouth two times a day 8)  Flomax 0.4 Mg Xr24h-cap (Tamsulosin hcl) .Marland Kitchen.. 1 tab by mouth q hs 9)  Mediplast 40 % Misc (Salicylic acid) .... Cut to fit wart on left wrist and apply new plaster daily, pumice away any softening wart with each plaster change. may continue for 8-12 weeks. 10)  Eq Miconazole 7 2 % Crea (Miconazole nitrate) .... Apply two times a day to affected area until resolved 11)  Calcium Citrate + D 315-200 Mg-unit Tabs (Calcium citrate-vitamin d) .... 2 tabs by mouth two times a day 12)  Lamictal Xr 100 Mg Xr24h-tab (Lamotrigine) .Marland Kitchen.. 1 tab  by mouth every morning. 13)  Benztropine Mesylate 1 Mg Tabs (Benztropine mesylate) .Marland Kitchen.. 1 tab by mouth two times a day 14)  Haloperidol 0.5 Mg Tabs (Haloperidol) .... 1/2 tab by mouth two times a day 15)  Seroquel Xr 400 Mg Xr24h-tab (Quetiapine fumarate) .Marland Kitchen.. 1 tab by mouth at bedtime  Patient Instructions: 1)  Repeat UA in 1 month--please just bring in . 2)  up with Dr. Delrae Alfred in 6 months   Laboratory Results   Urine Tests    Routine Urinalysis   Color: yellow Glucose: negative   (Normal Range: Negative) Bilirubin: negative   (Normal Range: Negative) Ketone: negative   (Normal Range: Negative) Spec. Gravity: 1.015   (Normal Range:  1.003-1.035) Blood: negative   (Normal Range: Negative) pH: 7.0   (Normal Range: 5.0-8.0) Protein: trace   (Normal Range: Negative) Urobilinogen: negative   (Normal Range: 0-1) Nitrite: negative   (Normal Range: Negative) Leukocyte Esterace: negative   (Normal Range: Negative)

## 2010-06-25 NOTE — Letter (Signed)
Summary: MEDICAL EQUIPMENT ORDER FORM  MEDICAL EQUIPMENT ORDER FORM   Imported By: Arta Bruce 08/31/2009 11:30:02  _____________________________________________________________________  External Attachment:    Type:   Image     Comment:   External Document

## 2010-06-25 NOTE — Letter (Signed)
Summary: DURABLE MEDICAL EQUIPMENT  DURABLE MEDICAL EQUIPMENT   Imported By: Arta Bruce 01/25/2010 09:40:59  _____________________________________________________________________  External Attachment:    Type:   Image     Comment:   External Document

## 2010-06-25 NOTE — Letter (Signed)
Summary: [facility]//PHYSICIAN'S ORDER FOR MEDICATION  [facility]//PHYSICIAN'S ORDER FOR MEDICATION   Imported By: Arta Bruce 07/13/2009 09:43:52  _____________________________________________________________________  External Attachment:    Type:   Image     Comment:   External Document

## 2010-06-25 NOTE — Letter (Signed)
Summary: [facility]//SUPPLIES   [facility]SUPPLIES   Imported By: Arta Bruce 08/14/2009 15:00:51  _____________________________________________________________________  External Attachment:    Type:   Image     Comment:   External Document

## 2010-06-25 NOTE — Medication Information (Signed)
Summary: RX Folder//CALCIUM CITRATE  RX Folder//CALCIUM CITRATE   Imported By: Arta Bruce 07/10/2009 12:21:35  _____________________________________________________________________  External Attachment:    Type:   Image     Comment:   External Document

## 2010-06-25 NOTE — Letter (Signed)
Summary: MEDICAL UPDATE & PT INFO  MEDICAL UPDATE & PT INFO   Imported By: Arta Bruce 10/15/2009 11:56:18  _____________________________________________________________________  External Attachment:    Type:   Image     Comment:   External Document

## 2010-06-25 NOTE — Progress Notes (Signed)
  Phone Note Other Incoming   Summary of Call: [name] December from [facility] called and they really need the forms signed so [name] can get his briefs and drinks. I found the forms and put it in your refill slot. Initial call taken by: Arta Bruce,  Sep 25, 2009 3:58 PM  Follow-up for Phone Call        Please see open phone note-I have called and left  messages at his residence that I need more information and am not getting a return call.  If they call again, please let them know I need to speak with someone. Follow-up by: [name] MD,  Sep 25, 2009 6:24 PM  Additional Follow-up for Phone Call Additional follow up Details #1::        I HAVE THE CONTACT PERSON @ [facility]//HER NAME IS SHARON AND HER NUMBER IS [phone] Additional Follow-up by: Arta Bruce,  Sep 26, 2009 9:11 AM

## 2010-06-25 NOTE — Letter (Signed)
Summary: PLAN OF CARE 07/31/09 TO 09/28/09  PLAN OF CARE 07/31/09 TO 09/28/09   Imported By: Silvio Pate Stanislawscyk 10/15/2009 12:17:08  _____________________________________________________________________  External Attachment:    Type:   Image     Comment:   External Document

## 2010-06-25 NOTE — Letter (Signed)
Summary: [facility]//PHYSICIANS ORDER TO CAREGIVER  [facility]//PHYSICIANS ORDER TO CAREGIVER   Imported By: Arta Bruce 07/30/2009 12:22:58  _____________________________________________________________________  External Attachment:    Type:   Image     Comment:   External Document

## 2010-06-25 NOTE — Letter (Signed)
Summary: Internal Correspondence//ACCIDENT REPORT  Internal Correspondence//ACCIDENT REPORT   Imported By: Arta Bruce 06/13/2009 10:09:29  _____________________________________________________________________  External Attachment:    Type:   Image     Comment:   External Document

## 2010-06-25 NOTE — Assessment & Plan Note (Signed)
Summary: CPE/////KT   Vital Signs:  Patient profile:   45 year old male Height:      70 inches Weight:      133 pounds BMI:     19.15 Temp:     97.3 degrees F Pulse rate:   81 / minute Pulse rhythm:   regular Resp:     20 per minute BP sitting:   104 / 73  (left arm) Cuff size:   regular  Vitals Entered By: Vesta Mixer CMA (June 07, 2009 10:19 AM) CC: CPE  Does patient need assistance? Ambulation Wheelchair   CC:  CPE.  History of Present Illness: 45 yo male here for CPE  Summer Tanner accompanies pt.--day staff.  Pt. had a slow fall off exam table earlier--Summer helped him gradually to floor.  No injury.  Pt. without concerns.  Concerns:  None  1.  Seizure Disorder--post head injury:  In past 2 weeks had one seizure--given his meds subsequently and no problems since.  2.  Osteoporosis:  need to check and see if pt. actually went in for treatment with IV bisphosphonate.  Allergies (verified): 1)  * Pineapple  Past History:  Past Medical History: Reviewed history from 02/24/2007 and no changes required. 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 1.  Hx of tympanostomy tubes bilaterally 2.  ORIF right femur fracture after fall.  Family History: Reviewed history from 03/26/2007 and no  changes required. Mother, 54s, decreased hearing Father, 71s, Blind since young adulthood,  Brother, 75s, healthy Sister, 61s, healthy  Social History: Reviewed history from 03/07/2008 and no changes required. Lives at [facility].  Parents still involved regularly. He goes to see them.  Father in nursing home Watches TV and listens to radio Doesn't get outside much Can pull to stand with a walker, but does not attempt much.    Review of Systems General:  Energy is okay.  Pt states he feels fine on current meds.  Not clear when Haloperidol, Lamictal, Benztropine started, but through [facility].  His caregiver states she has noted slouching, slurred speech over past 9 months or so.. Eyes:  Has glasses.. ENT:  Denies decreased hearing. Resp:  Denies shortness of breath. GI:  Denies abdominal pain; No definite diarrhea or constipation--goes once daily--normal.   Does have continence of urine currently. GU:  Denies discharge and urinary frequency. MS:  Denies joint pain, joint redness, and joint swelling. Derm:  Denies lesion(s) and rash. Neuro:  See previous--general decline.  Per living facility, Neurology stated they did not need to see back--pt with general progressive decline from head injury as he ages.Marland Kitchen Psych:  Not as happy at the home--up and down.  Does not really seem sleepy.  Often does not talk--soft mumble, won't lift himself up.  Previously, was down all the time--up and down now without obvious reason.Marland Kitchen  Physical Exam  General:  Male with atrophied legs>>arms, thin, pale, NAD.  Interacts normally, though speech may be a bit more  garbled at  times--pt. can improve enunciation if asked to do so Head:  Normocephalic and atraumatic without obvious abnormalities. No apparent alopecia or balding. Eyes:  No corneal or conjunctival inflammation noted. EOMI. Perrla. Funduscopic exam benign, without hemorrhages, exudates or papilledema. Vision grossly  normal. Ears:  External ear exam shows no significant lesions or deformities.  Otoscopic examination reveals clear canals, tympanic membranes are intact bilaterally without bulging, retraction, inflammation or discharge. Hearing is grossly normal bilaterally. Nose:  External nasal examination shows no deformity or inflammation. Nasal mucosa are pink and moist without lesions or exudates. Mouth:  Oral mucosa and oropharynx without lesions or exudates.  fair dentition.   Neck:  No deformities, masses, or tenderness noted. Lungs:  Normal respiratory effort, chest expands symmetrically. Lungs are clear to auscultation, no crackles or wheezes. Heart:  Normal rate and regular rhythm. S1 and S2 normal without gallop, murmur, click, rub or other extra sounds. Abdomen:  Bowel sounds positive,abdomen soft and non-tender without masses, organomegaly or hernias noted. Rectal:  No external abnormalities noted. Normal sphincter tone. No rectal masses or tenderness.  Heme negative light brown stool Genitalia:  Testes bilaterally descended without nodularity, tenderness or masses. No scrotal masses or lesions. No penis lesions or urethral discharge. Prostate:  Prostate gland firm and smooth, no enlargement, nodularity, tenderness, mass, asymmetry or induration. Msk:  Leans to right much of time in wheelchair.  Mild kyphosis Pulses:  R and L carotid,radial,femoral,dorsalis pedis and posterior tibial pulses are full and equal bilaterally Extremities:  atrophied, especially legs Neurologic:  alert & oriented X3.  Speech somewhat dysarthric.  Strength decreased throughout, though is able to resist opposing force.  DTRs 2-3+/4--not particularly hyperreflexic.  Can help with transfers, but minimally Skin:  No breakdown of skin. Soft tissue mass over left shoulder--nontender. Cervical Nodes:  No lymphadenopathy noted Axillary Nodes:  No palpable lymphadenopathy Inguinal Nodes:  No significant adenopathy Psych:  Alert  and cooperative today--smiles and jokes.   Impression & Recommendations:  Problem # 1:  HEALTH MAINTENANCE EXAM (ICD-V70.0) Called [facility] and spoke with staff there as well--to fax over records when meds started and when started having more symptoms. Could just be expected deterioration as per Neurology from previous severe head injury Flu vaccine Orders: T-Lipid Profile ([id number])  Problem # 2:  OSTEOPOROSIS (ICD-733.00)  His updated medication list for this problem includes:    Calcium Citrate + D 315-200 Mg-unit Tabs (Calcium citrate-vitamin d) .Marland Kitchen... 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 1 tab by mouth two times a day    Carbatrol 300 Mg Cp12 (Carbamazepine) .Marland Kitchen... 1 cap by mouth two times a day    Clonazepam Odt 0.5 Mg Tbdp (Clonazepam) .Marland Kitchen... 1 tab by mouth two times a day prn    Lamictal Xr 100 Mg Xr24h-tab (Lamotrigine) .Marland Kitchen... 1 tab by mouth every morning.  Complete Medication List: 1)  Depakote Er 500 Mg Tb24 (Divalproex sodium) .Marland Kitchen.. 1 tab by mouth two times a day 2)  Carbatrol 300 Mg Cp12 (Carbamazepine) .Marland Kitchen.. 1 cap by mouth two times a day 3)  Oxybutynin Chloride 15 Mg Tb24 (Oxybutynin chloride) .Marland Kitchen.. 1 tab by  mouth two times a day 4)  Omeprazole 20 Mg Tbec (Omeprazole) .Marland Kitchen.. 1 tab by mouth two times a day 5)  Fluoxetine Hcl 20 Mg Caps (Fluoxetine hcl) .Marland Kitchen.. 1 cap by mouth daily 6)  Clonazepam Odt 0.5 Mg Tbdp (Clonazepam) .Marland Kitchen.. 1 tab by mouth two times a day prn 7)  Thick-it Powd (Starch-maltodextrin) .... Honey consistency with liquids po 8)  Ensure Liqd (Nutritional supplements) .Marland Kitchen.. 1 can by mouth two times a day 9)  Flomax  0.4 Mg Xr24h-cap (Tamsulosin hcl) .Marland Kitchen.. 1 tab by mouth q hs 10)  Mediplast 40 % Misc (Salicylic acid) .... Cut to fit wart on left wrist and apply new plaster daily, pumice away any softening wart with each plaster change. may continue for 8-12 weeks. 11)  Eq Miconazole 7 2 % Crea (Miconazole nitrate) .... Apply two times a day to affected area until resolved 12)  Calcium Citrate + D 315-200 Mg-unit Tabs (Calcium citrate-vitamin d) .... 2 tabs by mouth two times a day 13)  Lamictal Xr 100 Mg Xr24h-tab (Lamotrigine) .Marland Kitchen.. 1 tab by mouth every morning. 14)  Benztropine Mesylate 1 Mg Tabs (Benztropine mesylate) .Marland Kitchen.. 1 tab by mouth two times a day 15)  Haloperidol 0.5 Mg Tabs (Haloperidol) .... 1/2 tab by mouth two times a day 16)  Seroquel Xr 300 Mg Xr24h-tab (Quetiapine fumarate) .... 2 tabs po at bedtime  Other Orders: T-Comprehensive Metabolic Panel [phone]) T-CBC w/Diff [phone]) T-Tegretol (Carbamazepine) [phone]) T-TSH [phone]) Flu Vaccine 13yrs + [phone]) Admin 1st Vaccine (24401) Admin 1st Vaccine [facility]) [phone])  Patient Instructions: 1)  Follow up with Dr. Delrae Alfred in 2 months --behavior versus medication side effect monitoring   Influenza Vaccine    Vaccine Type: Fluvax 3+    Site: left deltoid    Mfr: Sanofi Pasteur    Dose: 0.5 ml    Route: IM    Given by: Vesta Mixer CMA    Exp. Date: 11/22/2009    Lot #: [id number]    VIS given: 12/17/06 version given June 07, 2009.   Appended Document: CPE/////KT  Laboratory Results    Stool - Occult Blood Hemmoccult #1: negative Date: 07/26/2009  pt only turned in one card... [place] Shannon  July 26, 2009 4:26 PM

## 2010-06-25 NOTE — Letter (Signed)
[facility]   Imported By: Arta Bruce 08/10/2009 11:34:49  _____________________________________________________________________  External Attachment:    Type:   Image     Comment:   External Document

## 2010-06-25 NOTE — Letter (Signed)
[facility]: [facility]   Imported By: Arta Bruce 03/20/2010 10:46:47  _____________________________________________________________________  External Attachment:    Type:   Image     Comment:   External Document

## 2010-06-27 NOTE — Letter (Signed)
[facility]: [facility]   Imported By: Arta Bruce 06/17/2010 14:46:08  _____________________________________________________________________  External Attachment:    Type:   Image     Comment:   External Document

## 2010-06-27 NOTE — Progress Notes (Signed)
  Phone Note Outgoing Call   Summary of Call: Was supposed to follow up with me in September or so and have not seen.  Please get him scheduled next available. Also--please ask if any of his antipsychotic medication (Haldol and Cogentin in particular were stopped after his Spring visit with Dr. Anne Hahn. Initial call taken by: [name] MD,  May 27, 2010 2:06 PM  Follow-up for Phone Call        [phone] no answer. Gaylyn Cheers RN  May 28, 2010 2:00 PM    tried calling pt but no answer .Marland KitchenMarland KitchenArmenia Shannon  May 29, 2010 11:12 AM   Additional Follow-up for Phone Call Additional follow up Details #1::        tried calling pt but no answer ....will mail letter.Marland KitchenMarland KitchenMarland KitchenArmenia Shannon  May 30, 2010 9:32 AM     New/Updated Medications: LAMICTAL XR 100 MG XR24H-TAB (LAMOTRIGINE) 1 tab by mouth every morning.  initiated 06/12/08 by [facility] BENZTROPINE MESYLATE 1 MG TABS (BENZTROPINE MESYLATE) 1 tab by mouth two times a day.  Initiated 10/19/08 by Brock Bad, Np  [facility] HALOPERIDOL 0.5 MG TABS (HALOPERIDOL) 1/2 tab by mouth two times a day  started 09/10/08 by Dr Tiburcio Pea, [facility] SEROQUEL XR 400 MG XR24H-TAB (QUETIAPINE FUMARATE) 1 tab by mouth at bedtime.  Initiated 05/16/08 by Brock Bad [facility]

## 2010-06-27 NOTE — Letter (Signed)
Summary: *HSN Results Follow up  [facility]  [address] [place], [place] [address]   Phone: [phone]  Fax: [phone]      05/30/2010   Simuel Daphine Deutscher [address] [place], [place]  [address]   Dear  Mr. Abrian Brawley,                            ____S.Drinkard,FNP   ____D. Gore,FNP       ____B. McPherson,MD   ____V. Rankins,MD    ____E. Nelson Julson,MD    ____N. Daphine Deutscher, FNP  ____D. Reche Dixon, MD    ____K. Philipp Deputy, MD    ____Other     This letter is to inform you that your recent test(s):  _______Pap Smear    _______Lab Test     _______X-ray    _______ is within acceptable limits  _______ requires a medication change  _______ requires a follow-up lab visit  ___X____ requires a follow-up visit with your provider   Comments:       _________________________________________________________ If you have any questions, please contact our office                     Sincerely,  Arm[place] Shannon [facility]

## 2010-06-27 NOTE — Letter (Signed)
Summary: [facility]   Imported By: Arta Bruce 06/17/2010 14:42:38  _____________________________________________________________________  External Attachment:    Type:   Image     Comment:   External Document

## 2010-06-28 ENCOUNTER — Encounter (INDEPENDENT_AMBULATORY_CARE_PROVIDER_SITE_OTHER): Payer: Self-pay | Admitting: Internal Medicine

## 2010-07-03 NOTE — Letter (Signed)
Summary: [facility]   Imported By: Arta Bruce 06/28/2010 10:55:40  _____________________________________________________________________  External Attachment:    Type:   Image     Comment:   External Document

## 2010-07-03 NOTE — Letter (Signed)
Summary: [facility]//FAXED  [facility]//FAXED   Imported By: Arta Bruce 06/28/2010 10:51:25  _____________________________________________________________________  External Attachment:    Type:   Image     Comment:   External Document

## 2010-07-05 ENCOUNTER — Encounter (INDEPENDENT_AMBULATORY_CARE_PROVIDER_SITE_OTHER): Payer: Self-pay | Admitting: Internal Medicine

## 2010-07-11 NOTE — Letter (Signed)
Summary: [facility]//FAXED  [facility]//FAXED   Imported By: Arta Bruce 07/05/2010 09:13:00  _____________________________________________________________________  External Attachment:    Type:   Image     Comment:   External Document

## 2010-08-05 LAB — BASIC METABOLIC PANEL
BUN: 9 mg/dL (ref 6–23)
Creatinine, Ser: 0.71 mg/dL (ref 0.4–1.5)
GFR calc non Af Amer: >60 mL/min (ref >60)
Glucose, Bld: 105 mg/dL — ABNORMAL HIGH (ref 70–99)
Potassium: 3.9 mEq/L (ref 3.5–5.1)

## 2010-08-05 LAB — CBC
HCT: 37.2 % — ABNORMAL LOW (ref 39.0–52.0)
MCHC: 32 g/dL (ref 30.0–36.0)
RDW: 15.1 % (ref 11.5–15.5)

## 2010-08-05 LAB — URINALYSIS, ROUTINE W REFLEX MICROSCOPIC
Bilirubin Urine: NEGATIVE
Ketones, ur: NEGATIVE mg/dL
Nitrite: NEGATIVE
Protein, ur: NEGATIVE mg/dL
Urobilinogen, UA: 0.2 mg/dL (ref 0.0–1.0)
pH: 7.5 (ref 5.0–8.0)

## 2010-08-05 LAB — DIFFERENTIAL
Basophils Absolute: 0 10*3/uL (ref 0.0–0.1)
Basophils Relative: 0 % (ref 0–1)
Eosinophils Relative: 2 % (ref 0–5)
Monocytes Absolute: 0.9 10*3/uL (ref 0.1–1.0)
Neutro Abs: 4.9 10*3/uL (ref 1.7–7.7)

## 2010-08-07 LAB — CBC
HCT: 36.8 % — ABNORMAL LOW (ref 39.0–52.0)
MCH: 27.1 pg (ref 26.0–34.0)
MCHC: 32.3 g/dL (ref 30.0–36.0)
RDW: 19.8 % — ABNORMAL HIGH (ref 11.5–15.5)

## 2010-08-07 LAB — DIFFERENTIAL
Lymphocytes Relative: 19 % (ref 12–46)
Lymphs Abs: 1.1 10*3/uL (ref 0.7–4.0)
Monocytes Relative: 10 % (ref 3–12)

## 2010-08-07 LAB — CARBAMAZEPINE LEVEL, TOTAL: Carbamazepine Lvl: 5.2 ug/mL (ref 4.0–12.0)

## 2010-08-07 LAB — POCT I-STAT, CHEM 8
BUN: 8 mg/dL (ref 6–23)
Chloride: 102 mEq/L (ref 96–112)
Potassium: 3.8 mEq/L (ref 3.5–5.1)
Sodium: 141 mEq/L (ref 135–145)

## 2010-08-07 LAB — GLUCOSE, CAPILLARY: Glucose-Capillary: 161 mg/dL — ABNORMAL HIGH (ref 70–99)

## 2010-08-09 LAB — CBC
HCT: 34.8 % — ABNORMAL LOW (ref 39.0–52.0)
Hemoglobin: 11.3 g/dL — ABNORMAL LOW (ref 13.0–17.0)
RBC: 4.3 MIL/uL (ref 4.22–5.81)
RDW: 19.4 % — ABNORMAL HIGH (ref 11.5–15.5)
WBC: 5.3 10*3/uL (ref 4.0–10.5)

## 2010-08-09 LAB — URINALYSIS, ROUTINE W REFLEX MICROSCOPIC
Bilirubin Urine: NEGATIVE
Glucose, UA: NEGATIVE mg/dL
Ketones, ur: NEGATIVE mg/dL
Nitrite: NEGATIVE
pH: 7.5 (ref 5.0–8.0)

## 2010-08-09 LAB — DIFFERENTIAL
Basophils Relative: 3 % — ABNORMAL HIGH (ref 0–1)
Eosinophils Absolute: 0.2 10*3/uL (ref 0.0–0.7)
Monocytes Relative: 11 % (ref 3–12)
Neutrophils Relative %: 57 % (ref 43–77)

## 2010-08-09 LAB — COMPREHENSIVE METABOLIC PANEL
ALT: 20 U/L (ref 0–53)
Alkaline Phosphatase: 106 U/L (ref 39–117)
CO2: 28 mEq/L (ref 19–32)
Chloride: 103 mEq/L (ref 96–112)
Glucose, Bld: 115 mg/dL — ABNORMAL HIGH (ref 70–99)
Potassium: 3.7 mEq/L (ref 3.5–5.1)
Sodium: 141 mEq/L (ref 135–145)
Total Bilirubin: 0.4 mg/dL (ref 0.3–1.2)
Total Protein: 7 g/dL (ref 6.0–8.3)

## 2010-08-09 LAB — VALPROIC ACID LEVEL: Valproic Acid Lvl: <10 ug/mL — ABNORMAL LOW (ref 50.0–100.0)

## 2010-08-15 ENCOUNTER — Encounter (INDEPENDENT_AMBULATORY_CARE_PROVIDER_SITE_OTHER): Payer: Self-pay | Admitting: Internal Medicine

## 2010-08-22 NOTE — Letter (Signed)
Summary: MEDICAL EQUIPMENT ORDER FORM  MEDICAL EQUIPMENT ORDER FORM   Imported By: Arta Bruce 08/15/2010 10:57:44  _____________________________________________________________________  External Attachment:    Type:   Image     Comment:   External Document

## 2010-09-03 LAB — BASIC METABOLIC PANEL
BUN: 8 mg/dL (ref 6–23)
Chloride: 103 mEq/L (ref 96–112)
Glucose, Bld: 92 mg/dL (ref 70–99)
Potassium: 4 mEq/L (ref 3.5–5.1)

## 2010-09-09 LAB — URINE CULTURE
Colony Count: NO GROWTH
Culture: NO GROWTH

## 2010-09-09 LAB — ETHANOL: Alcohol, Ethyl (B): <5 mg/dL (ref 0–10)

## 2010-09-09 LAB — POCT I-STAT, CHEM 8
Calcium, Ion: 1.18 mmol/L (ref 1.12–1.32)
Chloride: 107 mEq/L (ref 96–112)
Glucose, Bld: 101 mg/dL — ABNORMAL HIGH (ref 70–99)
HCT: 46 % (ref 39.0–52.0)
Hemoglobin: 15.6 g/dL (ref 13.0–17.0)

## 2010-09-09 LAB — URINALYSIS, ROUTINE W REFLEX MICROSCOPIC
Glucose, UA: NEGATIVE mg/dL
Hgb urine dipstick: NEGATIVE
Specific Gravity, Urine: 1.012 (ref 1.005–1.030)

## 2010-09-19 ENCOUNTER — Ambulatory Visit: Payer: Medicaid Other

## 2010-09-30 ENCOUNTER — Ambulatory Visit: Payer: Medicare Other | Attending: Neurology

## 2010-09-30 DIAGNOSIS — IMO0001 Reserved for inherently not codable concepts without codable children: Secondary | ICD-10-CM | POA: Insufficient documentation

## 2010-09-30 DIAGNOSIS — R471 Dysarthria and anarthria: Secondary | ICD-10-CM | POA: Insufficient documentation

## 2010-10-07 ENCOUNTER — Ambulatory Visit: Payer: Medicare Other

## 2010-10-08 NOTE — Discharge Summary (Signed)
NAMEMIKE, HAMRE NO.:  [id number]   MEDICAL RECORD NO.:  [id number]          PATIENT TYPE:  INP   LOCATION:  5503                         FACILITY:  [facility]   PHYSICIAN:  Eduard Clos, MDDATE OF BIRTH:  Mar 08, 1966   DATE OF ADMISSION:  05/01/2008  DATE OF DISCHARGE:                               DISCHARGE SUMMARY   COURSE IN THE HOSPITAL:  A 45 year old male with history of  encephalopathy and paraparesis status post traumatic brain injury.  History of seizure disorder, depression, personality disorder.  Was  brought into the ER, the patient was found to have hypothermia which was  measured around 89.7.  The patient also initially was found to be mildly  hypotensive.  Was started on empiric antibiotics and placed on heating  blankets, Bair Hugger.  CT of the head done on admission did not show  any acute finding except for CVA, atrophy and changes of  encephalomalacia.  The patient also had mild confusion on admission.  CCM consult was obtained and also neurology consult.  Per neurologist,  the patient had EEG that do not reveal any seizure-like activity.  MRI  of the brain done showed no evidence of any acute ischemia, bifrontal  and bitemporal atrophies, cerebellar vermis atrophy.  Per neurologist,  patient's Risperdal was discontinued and Tegretol levels were increased.  The patient's blood cultures showed no growth.  Urine culture only  showed more than 100,000 colonies of diphtheroids.  No sensitivities for  this will be done.  As the patient's hypothermia has improved the  patient was transferred to medical floor, IV fluids at this time has  been discontinued.  Patient has become more awake, alert and following  commands.  At this time the plan would be to discharge patient to group  home.  I have discussed with Dr. Anne Hahn who is going to follow the  patient as outpatient to recheck his Tegretol level and ammonia level  within 3 days and Dr. Anne Hahn  will be following the patient in 1 week in  [facility].  Will also discharge patient on 1 week of  antibiotics for possible UTI.   PROCEDURES DONE DURING THIS STAY:  1. CT of the head on May 01, 2008 shows no acute abnormality, no      change since September 17, 2006.  CVA atrophy and chronic changes of      encephalomalacia.  2. Chest x-ray on May 01, 2008 showed no active lung disease.  3. Chest x-ray on May 03, 2008 showed elevation of right      hemidiaphragm with minimal right basalar atelectasis.  4. Chest x-ray on May 04, 2008 shows persistent right basilar      atelectasis.  5. MRI of the brain done on May 04, 2008 shows no evidence of      acute ischemia, bifrontal and bitemporal atrophies, cerebellar      vermis atrophy, nonspecific subcortical white matter changes      supratentorially especially in the bifrontal regions and the      anterior temporal region.  These  are nonsustained in terms of      etiology and may represent days of ischemic gliosis related to      small vessel disease.  Possible sequelae of previous trauma, no      significant changes appreciated compared to the previous MRI scan.   FINAL DIAGNOSIS:  1. 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  2. Seizure disorder.  3. Altered mental status secondary to hypothermia and probable urinary      tract infection.  4. Urinary tract infection.  5. History of traumatic brain injury.  6. Chronic urinary retention.   MEDICATIONS AT DISCHARGE:  1. Ditropan 15 mg p.o. b.i.d.  2. Carafate 10 mL p.o. twice daily.  3. Flomax 0.4 mg p.o. at bedtime.  4. Klonopin 0.5 mg p.o. twice a day.  5. Prilosec 20 mg p.o. daily.  6. Prozac 20 mg p.o. nightly.  7. Tegretol 400 mg p.o. b.i.d.  8. Depakote 250 mg p.o. b.i.d. until 1 week and stop.  9. Doxycycline 100 mg p.o. b.i.d. for 1 week and stop.   PLAN:  The patient is to follow with [facility] Neurologist, Dr. Anne Hahn,  in one week's time.  Please call  [facility] at [phone] to  make an appointment in one week's time.  I have already discussed this  with Dr. Anne Hahn who is also going to arrange it to follow Tegretol level  and ammonia level within 3 days of transfer to group home and those have  to be faxed to Dr. Anne Hahn, Neurologist [phone] and this was to be  conveyed to Dr. Anne Hahn, Neurologist.  The patient is to be on a regular  diet, aspiration precaution, seizure precautions.  Activity as  tolerated.  To follow with his PCP within a week's time.      Eduard Clos, MD  Electronically Signed     ANK/MEDQ  D:  05/08/2008  T:  05/08/2008  Job:  [id number]

## 2010-10-08 NOTE — Consult Note (Signed)
[name], [name] NO.:  [id number]   MEDICAL RECORD NO.:  [id number]          PATIENT TYPE:  EMS   LOCATION:  ED                           FACILITY:  [facility]   PHYSICIAN:  Michael L. Reynolds, M.D.DATE OF BIRTH:  01/18/1966   DATE OF CONSULTATION:  06/01/2008  DATE OF DISCHARGE:                                 CONSULTATION   REQUESTING PHYSICIAN:  [facility].   REASON FOR EVALUATION:  Seizure.   HISTORY OF PRESENT ILLNESS:  This is an inpatient consultation  evaluation of this existing [name] patient, a 45-  year-old man with a known history of epilepsy due to a remote head  injury, who is followed chronically in our office by Dr. Lesia Stephens  and Bruce Angel NP.  The patient was admitted to the hospital in  April 26, 2008 with hypothermia of uncertain etiology and was treated  for an urinary tract infection.  He also had elevated CK levels.  At  that time, his Risperdal was discontinued and he was placed on Seroquel.  During this hospitalization, he was tapered off of Depakote apparently  due to hyperammonemia.  He had ammonia levels of 48, 73 and 50.  He has  been on Tegretol and Depakote for some time apparently for seizure  control.  The patient was discharged from the hospital on May 08, 2008 and was seen in our office on May 22, 2008 by Bruce Angel,  NP.  At that time, he had a carbamazepine level of 7.4 and his ammonia  level was 49.  Today, he was brought in because of concerns of altered  mental status and possible seizure activity.  He was noted at his group  home to have some twitching of his extremities which began last night.  He seemed a little bit sleepier than usual.  This persisted into the day  and so he was brought to the emergency department where he was noted to  have frequent twitching movements which were thought to represent the  possibility of myoclonic seizures.  He  then had one generalized tonic-  clonic seizure and neurologic consultation was subsequently requested.  He has had some routine labs which were unremarkable to this point.  He  also had CT of the head which is unchanged.   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  He had the recent  hospitalization as above.  Of note, he had a very similar  hospitalization in early 2007 in which he was seen by Dr. Sharene Stephens.  At  that time, he was also hypothermic.  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   MEDICATIONS:  At the time of his evaluation in the office on May 22, 2008, his medications were:  1. Prozac 20 mg daily.  2. Ditropan 50 mg b.i.d.  3. Carbatrol 300 mg b.i.d.  4. Flomax.  5. Klonopin 0.5 mg t.i.d.  6. Prilosec.  7. Seroquel 200 mg b.i.d.  8. He was off of Depakote.   PHYSICAL EXAMINATION:  VITAL SIGNS:  Temperature 98.2, blood pressure  112/73, pulse 81, respirations 16, O2 sat 96% on room air.  GENERAL:  This is a chronically ill-appearing man supine in the hospital  bed in no distress.  HEENT:  Head is normocephalic, atraumatic.  Oropharynx benign.  NECK:  Supple without carotid or supraclavicular bruits.  HEART:  Regular rate and rhythm without murmurs.  MENTAL STATUS:  He is awake and alert.  His speech is slow, but he is  aware that he is at hospital and is able to state the city, but not the  year.  We is able to follow 1-2 step commands.  CRANIAL NERVES:  Pupils are equal and briskly reactive.  Extraocular  movements are full, but he has a right exotropia.  Face and palate move  normally and symmetrically.  MOTOR:  Normal bulk with increased tone in the lower extremities.  He  moves all extremities well.  Sensation intact to light touch in all  extremities.  Gait is deferred.   LABORATORY DATA:  Urinalysis is negative.  Glucose is 103.   Carbamazepine level is 8.2.  Routine chemistries were unremarkable.   DIAGNOSTICS:  CT of the head is as above and is unchanged.   IMPRESSION:  1. Epilepsy with breakthrough seizure, probably secondary to      discontinuation of Depakote in December 2009.  Apparently the      Depakote was discontinued secondary to  hyperammonemia, but      curiously this was checked in our office on May 17, 2008 at      which time it was 30 and therefore this did not resolve off the      medication.  2. 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   RECOMMENDATIONS:  He has received a gram of intravenous Depacon in the  ED today which resulted in  clinical improvement.  I would recommend  resuming Depakote at 500 mg b.i.d. and continuing the Tegretol.  He can  follow up in our office in 1-2 weeks for some follow up labs including  ammonia level.  However, I do not think that ammonia level should be the  only factor to determine whether or not he stays on Depakote as he  apparently needs the drug.  If the drug is discontinued at some point in  the future, consider replacing with Keppra.  I think he is stable to be  released from the emergency department this evening.   Thank you for the consultation.      Michael L. Thad Ranger, M.D.  Electronically Signed     MLR/MEDQ  D:  06/01/2008  T:  06/01/2008  Job:  [id number]
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
## 2010-10-08 NOTE — Procedures (Signed)
EEG ID NUMBER:  [id number].   REFERRING PHYSICIAN:  Marlan Palau, MD   CLINICAL HISTORY:  A 45 year old patient with hypothermia being  evaluated for possible seizures.   MEDICATION LISTED:  Tegretol, Klonopin, Depakote, Lovenox, Prozac,  Ditropan, and Zosyn.   This is a portable EEG recorded with the patient awake using 17-channel  machine in standard 10/20 electrode placement.   Background awake rhythm consists of 9-10 Hz alpha, which is a moderate  amplitude, synchronous, reactive to eye opening and closure.  No  paroxysmal epileptiform activity, spikes, or sharp waves are noted.  Changes of drowsiness are noted, but definite sleep stages are not seen.  Length of the recording is 23.9 minutes.  Technical component is  suboptimal with excessive muscle movement artifacts.  EKG tracing  reveals regular sinus rhythm.  Hyperventilation is not performed.  Photic stimulation is unremarkable.   IMPRESSION:  This EEG performed during awake state is within normal  limits.  No definite epileptiform features were noted.           ______________________________  Sunny Schlein. Pearlean Brownie, MD     [id number]  D:  05/05/2008 18:27:11  T:  05/06/2008 74:25:95  Job #:  [id number]   cc:   Marlan Palau, M.D.  Fax: [phone]
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
## 2010-10-11 NOTE — Op Note (Signed)
NAMECURRIE, DENNIN NO.:  [id number]   MEDICAL RECORD NO.:  [id number]          PATIENT TYPE:  AMB   LOCATION:  ENDO                         FACILITY:  [facility]   PHYSICIAN:  Shirley Friar, MDDATE OF BIRTH:  Aug 07, 1965   DATE OF PROCEDURE:  08/05/2006  DATE OF DISCHARGE:  08/05/2006                               OPERATIVE REPORT   INDICATION:  Persistent vomiting   MEDICATIONS:  Fentanyl 35 mcg IV, Versed 3 mg IV.   FINDINGS:  Endoscope was inserted through the oropharynx and esophagus  was intubated.  Circumferential linear ulcerations and erythema with  oozing of blood was noted from the mid esophagus down to the GE junction  consistent with severe (grade D) erosive esophagitis.  No mass lesions  were noted.  Endoscope was advanced into the stomach which revealed a  small hiatal hernia but otherwise normal stomach mucosa.  Endoscope was  passed down to the duodenal bulb and second portion of duodenum which  were both normal.  No ulcers or mass lesions were seen and no  obstruction was noted.  Endoscope was withdrawn and the esophagus was  reevaluated, again note the severe erosive esophagitis.   ASSESSMENT:  1. Severe grade D erosive esophagitis.  2. Small hiatal hernia, otherwise normal EGD.   PLAN:  1. Nexium 40 mg p.o. b.i.d.  2. Start Carafate 1 gram p.o. q.a.c. and q.h.s.      Shirley Friar, MD  Electronically Signed     VCS/MEDQ  D:  08/05/2006  T:  08/07/2006  Job:  [id number]
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
## 2010-10-14 ENCOUNTER — Ambulatory Visit: Payer: Medicare Other | Admitting: Speech Pathology

## 2010-10-25 ENCOUNTER — Ambulatory Visit: Payer: Medicaid Other | Attending: Neurology

## 2010-10-25 DIAGNOSIS — R471 Dysarthria and anarthria: Secondary | ICD-10-CM | POA: Insufficient documentation

## 2010-10-25 DIAGNOSIS — IMO0001 Reserved for inherently not codable concepts without codable children: Secondary | ICD-10-CM | POA: Insufficient documentation

## 2010-12-10 ENCOUNTER — Emergency Department (INDEPENDENT_AMBULATORY_CARE_PROVIDER_SITE_OTHER): Payer: Medicare Other

## 2010-12-10 ENCOUNTER — Emergency Department (HOSPITAL_BASED_OUTPATIENT_CLINIC_OR_DEPARTMENT_OTHER)
Admission: EM | Admit: 2010-12-10 | Discharge: 2010-12-10 | Disposition: A | Discharge status: Nursing Home (Includes ICF) | Payer: Medicare Other | Source: Home / Self Care | Attending: Emergency Medicine | Admitting: Emergency Medicine

## 2010-12-10 ENCOUNTER — Encounter (HOSPITAL_BASED_OUTPATIENT_CLINIC_OR_DEPARTMENT_OTHER): Payer: Self-pay | Admitting: Emergency Medicine

## 2010-12-10 ENCOUNTER — Inpatient Hospital Stay (HOSPITAL_COMMUNITY)
Admission: AD | Admit: 2010-12-10 | Discharge: 2010-12-13 | DRG: 811 | Disposition: A | Discharge status: Nursing Home (Includes ICF) | Payer: Medicare Other | Source: Other Acute Inpatient Hospital | Attending: Family Medicine | Admitting: Family Medicine

## 2010-12-10 DIAGNOSIS — I517 Cardiomegaly: Secondary | ICD-10-CM

## 2010-12-10 DIAGNOSIS — S32309A Unspecified fracture of unspecified ilium, initial encounter for closed fracture: Secondary | ICD-10-CM

## 2010-12-10 DIAGNOSIS — E43 Unspecified severe protein-calorie malnutrition: Secondary | ICD-10-CM | POA: Diagnosis present

## 2010-12-10 DIAGNOSIS — Y9210 Unspecified residential institution as the place of occurrence of the external cause: Secondary | ICD-10-CM | POA: Diagnosis present

## 2010-12-10 DIAGNOSIS — Z87820 Personal history of traumatic brain injury: Secondary | ICD-10-CM

## 2010-12-10 DIAGNOSIS — K2210 Ulcer of esophagus without bleeding: Secondary | ICD-10-CM | POA: Diagnosis present

## 2010-12-10 DIAGNOSIS — G40909 Epilepsy, unspecified, not intractable, without status epilepticus: Secondary | ICD-10-CM | POA: Diagnosis present

## 2010-12-10 DIAGNOSIS — K7689 Other specified diseases of liver: Secondary | ICD-10-CM

## 2010-12-10 DIAGNOSIS — W19XXXA Unspecified fall, initial encounter: Secondary | ICD-10-CM | POA: Diagnosis present

## 2010-12-10 DIAGNOSIS — X58XXXA Exposure to other specified factors, initial encounter: Secondary | ICD-10-CM

## 2010-12-10 DIAGNOSIS — F0789 Other personality and behavioral disorders due to known physiological condition: Secondary | ICD-10-CM | POA: Insufficient documentation

## 2010-12-10 DIAGNOSIS — T68XXXA Hypothermia, initial encounter: Secondary | ICD-10-CM

## 2010-12-10 DIAGNOSIS — R4182 Altered mental status, unspecified: Secondary | ICD-10-CM

## 2010-12-10 DIAGNOSIS — F17200 Nicotine dependence, unspecified, uncomplicated: Secondary | ICD-10-CM | POA: Diagnosis present

## 2010-12-10 DIAGNOSIS — D509 Iron deficiency anemia, unspecified: Principal | ICD-10-CM | POA: Diagnosis present

## 2010-12-10 DIAGNOSIS — D649 Anemia, unspecified: Secondary | ICD-10-CM

## 2010-12-10 DIAGNOSIS — J9819 Other pulmonary collapse: Secondary | ICD-10-CM

## 2010-12-10 DIAGNOSIS — R109 Unspecified abdominal pain: Secondary | ICD-10-CM | POA: Diagnosis present

## 2010-12-10 DIAGNOSIS — I498 Other specified cardiac arrhythmias: Secondary | ICD-10-CM | POA: Diagnosis present

## 2010-12-10 DIAGNOSIS — I959 Hypotension, unspecified: Secondary | ICD-10-CM | POA: Diagnosis present

## 2010-12-10 DIAGNOSIS — R680 Hypothermia, not associated with low environmental temperature: Secondary | ICD-10-CM | POA: Diagnosis present

## 2010-12-10 HISTORY — DX: Peptic ulcer, site unspecified, unspecified as acute or chronic, without hemorrhage or perforation: K27.9

## 2010-12-10 HISTORY — DX: Unspecified injury of head, initial encounter: S09.90XA

## 2010-12-10 HISTORY — DX: Unspecified convulsions: R56.9

## 2010-12-10 HISTORY — DX: Depression, unspecified: F32.A

## 2010-12-10 HISTORY — DX: Major depressive disorder, single episode, unspecified: F32.9

## 2010-12-10 HISTORY — DX: Other personality and behavioral disorders due to known physiological condition: F07.89

## 2010-12-10 HISTORY — DX: Mild intellectual disabilities: F70

## 2010-12-10 LAB — URINALYSIS, ROUTINE W REFLEX MICROSCOPIC
Bilirubin Urine: NEGATIVE
Leukocytes, UA: NEGATIVE
Nitrite: NEGATIVE
Specific Gravity, Urine: 1.023 (ref 1.005–1.030)
Urobilinogen, UA: 0.2 mg/dL (ref 0.0–1.0)
pH: 5.5 (ref 5.0–8.0)

## 2010-12-10 LAB — COMPREHENSIVE METABOLIC PANEL
ALT: 32 U/L (ref 0–53)
ALT: 32 U/L (ref 0–53)
Alkaline Phosphatase: 107 U/L (ref 39–117)
BUN: 19 mg/dL (ref 6–23)
BUN: 14 mg/dL (ref 6–23)
CO2: 31 mEq/L (ref 19–32)
CO2: 30 mEq/L (ref 19–32)
Calcium: 9 mg/dL (ref 8.4–10.5)
Chloride: 104 mEq/L (ref 96–112)
Creatinine, Ser: 0.8 mg/dL (ref 0.50–1.35)
GFR calc Af Amer: >60 mL/min (ref >60)
GFR calc Af Amer: >60 mL/min (ref >60)
GFR calc non Af Amer: >60 mL/min (ref >60)
Glucose, Bld: 84 mg/dL (ref 70–99)
Glucose, Bld: 79 mg/dL (ref 70–99)
Potassium: 3.8 mEq/L (ref 3.5–5.1)
Sodium: 139 mEq/L (ref 135–145)
Sodium: 139 mEq/L (ref 135–145)
Total Bilirubin: 0.3 mg/dL (ref 0.3–1.2)
Total Protein: 6.4 g/dL (ref 6.0–8.3)
Total Protein: 6.1 g/dL (ref 6.0–8.3)

## 2010-12-10 LAB — CBC
HCT: 22.1 % — ABNORMAL LOW (ref 39.0–52.0)
HCT: 21.7 % — ABNORMAL LOW (ref 39.0–52.0)
Hemoglobin: 6.7 g/dL — CL (ref 13.0–17.0)
Hemoglobin: 6.6 g/dL — CL (ref 13.0–17.0)
MCHC: 30.3 g/dL (ref 30.0–36.0)
RBC: 3.04 MIL/uL — ABNORMAL LOW (ref 4.22–5.81)
RBC: 2.96 MIL/uL — ABNORMAL LOW (ref 4.22–5.81)
WBC: 6.9 10*3/uL (ref 4.0–10.5)
WBC: 5.8 10*3/uL (ref 4.0–10.5)

## 2010-12-10 LAB — DIFFERENTIAL
Basophils Relative: 1 % (ref 0–1)
Eosinophils Absolute: 0.1 10*3/uL (ref 0.0–0.7)
Eosinophils Relative: 2 % (ref 0–5)
Lymphocytes Relative: 15 % (ref 12–46)
Monocytes Relative: 13 % — ABNORMAL HIGH (ref 3–12)
Neutro Abs: 4.8 10*3/uL (ref 1.7–7.7)
Neutrophils Relative %: 69 % (ref 43–77)

## 2010-12-10 LAB — PROTIME-INR
INR: 1.08 (ref 0.00–1.49)
Prothrombin Time: 14.8 seconds (ref 11.6–15.2)

## 2010-12-10 LAB — LACTATE DEHYDROGENASE: LDH: 251 U/L — ABNORMAL HIGH (ref 94–250)

## 2010-12-10 LAB — ABO/RH: ABO/RH(D): O POS

## 2010-12-10 LAB — LIPASE, BLOOD: Lipase: 12 U/L (ref 11–59)

## 2010-12-10 LAB — OCCULT BLOOD (STOOL CUP TO LAB): Fecal Occult Bld: NEGATIVE

## 2010-12-10 MED ORDER — VANCOMYCIN HCL IN DEXTROSE 1-5 GM/200ML-% IV SOLN
1000.0000 mg | Freq: Once | INTRAVENOUS | Status: AC
  Administered 2010-12-10: 1000 mg via INTRAVENOUS
  Filled 2010-12-10: qty 200

## 2010-12-10 MED ORDER — SODIUM CHLORIDE 0.9 % IV BOLUS (SEPSIS): 500.0000 mL | Freq: Once | INTRAVENOUS | Status: DC

## 2010-12-10 MED ORDER — SODIUM CHLORIDE 0.9 % IV BOLUS (SEPSIS): 1000.0000 mL | Freq: Once | INTRAVENOUS | Status: DC

## 2010-12-10 MED ORDER — IOHEXOL 300 MG/ML  SOLN
100.0000 mL | Freq: Once | INTRAMUSCULAR | Status: AC | PRN
  Administered 2010-12-10: 100 mL via INTRAVENOUS

## 2010-12-10 MED ORDER — PIPERACILLIN-TAZOBACTAM 3.375 G IVPB
3.3750 g | Freq: Once | INTRAVENOUS | Status: DC
  Administered 2010-12-10: 3.375 g via INTRAVENOUS
  Filled 2010-12-10: qty 50
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
## 2010-12-10 NOTE — ED Notes (Signed)
Caregiver for patient states he was giving the patient a bath yesterday and noticed bruising on the RLQ of the abdomen.  Caregiver states today [name] was complaining of increased pain the that area.  Moderate bluish-red-yellow bruise noted on RLQ of abdomen and hip area.
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
## 2010-12-11 ENCOUNTER — Other Ambulatory Visit: Payer: Self-pay | Admitting: Gastroenterology

## 2010-12-11 LAB — COMPREHENSIVE METABOLIC PANEL
ALT: 29 U/L (ref 0–53)
Alkaline Phosphatase: 106 U/L (ref 39–117)
BUN: 11 mg/dL (ref 6–23)
CO2: 28 mEq/L (ref 19–32)
GFR calc Af Amer: >60 mL/min (ref >60)
GFR calc non Af Amer: >60 mL/min (ref >60)
Glucose, Bld: 90 mg/dL (ref 70–99)
Potassium: 3.8 mEq/L (ref 3.5–5.1)
Sodium: 139 mEq/L (ref 135–145)

## 2010-12-11 LAB — CROSSMATCH: ABO/RH(D): O POS

## 2010-12-11 LAB — HEMOGLOBIN AND HEMATOCRIT, BLOOD
Hemoglobin: 9.3 g/dL — ABNORMAL LOW (ref 13.0–17.0)
Hemoglobin: 9.4 g/dL — ABNORMAL LOW (ref 13.0–17.0)

## 2010-12-11 LAB — CBC
Hemoglobin: 8.7 g/dL — ABNORMAL LOW (ref 13.0–17.0)
MCH: 23.5 pg — ABNORMAL LOW (ref 26.0–34.0)
MCHC: 31.3 g/dL (ref 30.0–36.0)
MCV: 75.1 fL — ABNORMAL LOW (ref 78.0–100.0)
Platelets: 330 10*3/uL (ref 150–400)
RBC: 3.7 MIL/uL — ABNORMAL LOW (ref 4.22–5.81)

## 2010-12-11 LAB — VITAMIN B12: Vitamin B-12: 1015 pg/mL — ABNORMAL HIGH (ref 211–911)

## 2010-12-11 LAB — RAPID URINE DRUG SCREEN, HOSP PERFORMED
Amphetamines: NOT DETECTED
Barbiturates: NOT DETECTED
Benzodiazepines: NOT DETECTED
Tetrahydrocannabinol: NOT DETECTED

## 2010-12-11 LAB — URINALYSIS, ROUTINE W REFLEX MICROSCOPIC
Bilirubin Urine: NEGATIVE
Ketones, ur: 15 mg/dL — AB
Nitrite: NEGATIVE
Protein, ur: NEGATIVE mg/dL
Urobilinogen, UA: 0.2 mg/dL (ref 0.0–1.0)

## 2010-12-11 LAB — IRON AND TIBC
Saturation Ratios: 6 % — ABNORMAL LOW (ref 20–55)
TIBC: 314 ug/dL (ref 215–435)
UIBC: 295 ug/dL

## 2010-12-12 LAB — URINE CULTURE
Culture: NO GROWTH
Special Requests: NEGATIVE

## 2010-12-12 LAB — CBC
Hemoglobin: 8.9 g/dL — ABNORMAL LOW (ref 13.0–17.0)
MCH: 22.9 pg — ABNORMAL LOW (ref 26.0–34.0)
MCV: 75.1 fL — ABNORMAL LOW (ref 78.0–100.0)
RBC: 3.89 MIL/uL — ABNORMAL LOW (ref 4.22–5.81)

## 2010-12-12 LAB — BASIC METABOLIC PANEL
Chloride: 103 mEq/L (ref 96–112)
Creatinine, Ser: 0.86 mg/dL (ref 0.50–1.35)
GFR calc Af Amer: >60 mL/min (ref >60)
GFR calc non Af Amer: >60 mL/min (ref >60)
Potassium: 3.9 mEq/L (ref 3.5–5.1)

## 2010-12-13 LAB — BASIC METABOLIC PANEL
BUN: 10 mg/dL (ref 6–23)
Chloride: 104 mEq/L (ref 96–112)
Creatinine, Ser: 0.78 mg/dL (ref 0.50–1.35)
GFR calc Af Amer: >60 mL/min (ref >60)
Glucose, Bld: 97 mg/dL (ref 70–99)
Potassium: 4.1 mEq/L (ref 3.5–5.1)

## 2010-12-13 LAB — CBC
HCT: 32.4 % — ABNORMAL LOW (ref 39.0–52.0)
MCH: 23.1 pg — ABNORMAL LOW (ref 26.0–34.0)
MCHC: 30.2 g/dL (ref 30.0–36.0)
MCV: 76.2 fL — ABNORMAL LOW (ref 78.0–100.0)
Platelets: 359 10*3/uL (ref 150–400)
RDW: 18 % — ABNORMAL HIGH (ref 11.5–15.5)
WBC: 8.3 10*3/uL (ref 4.0–10.5)

## 2010-12-13 LAB — DIFFERENTIAL
Eosinophils Absolute: 0.2 10*3/uL (ref 0.0–0.7)
Eosinophils Relative: 3 % (ref 0–5)
Lymphocytes Relative: 22 % (ref 12–46)
Lymphs Abs: 1.8 10*3/uL (ref 0.7–4.0)
Monocytes Absolute: 1.3 10*3/uL — ABNORMAL HIGH (ref 0.1–1.0)
Monocytes Relative: 16 % — ABNORMAL HIGH (ref 3–12)
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
## 2010-12-17 LAB — CULTURE, BLOOD (ROUTINE X 2)
Culture  Setup Time: 201207180137
Culture  Setup Time: 201207180137
Culture: NO GROWTH
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
## 2011-01-14 NOTE — Op Note (Signed)
  NAMEDRESHAWN, HENDERSHOTT NO.:  [id number]  MEDICAL RECORD NO.:  [id number]  LOCATION:  1226                         FACILITY:  [facility]  PHYSICIAN:  Shirley Friar, MDDATE OF BIRTH:  1965-12-14  DATE OF PROCEDURE: DATE OF DISCHARGE:                              OPERATIVE REPORT   PROCEDURE:  Upper endoscopy.  INDICATIONS:  Anemia.  MEDICATIONS:  Fentanyl 50 mcg IV, Versed 3 mg IV, Cetacaine spray x1.  FINDINGS:  Endoscope was inserted through oropharynx and esophagus was intubated which revealed a clean based linear ulcer at the GE junction which 36 cm from the incisors.  This ulcer had white exudate or overlying it was some edema and erythema and there was a linear ulceration on the opposite wall.  Endoscope was advanced down the stomach which revealed normal-appearing gastric mucosa.  Retroflexion was done which revealed normal proximal stomach.  Endoscope was straightened and advanced into the duodenal bulb and second portion of duodenum which were unremarkable.  Endoscope was withdrawn back into the stomach and back to the esophagus and a cytologic brushing was done at this esophageal ulcer which was noted to be friable and there was some bleeding that did occur from the brushing that spontaneously resolved.  ASSESSMENT: 1. Distal esophageal ulcer status post brushing, check for yeast,     suspect acid reflux related. 2. Doubt this ulcer is the source of his significant anemia but it     definitely is playing a role.  PLAN: 1. Follow up on brushing. 2. IV Protonix q.12 h 3. Clear liquid diet and advance as tolerated.     Shirley Friar, MD     VCS/MEDQ  D:  12/11/2010  T:  12/11/2010  Job:  [id number]  Electronically Signed by Charlott Rakes MD on Feb 04, 202012 04:39:02 PM

## 2011-01-14 NOTE — Consult Note (Signed)
  NAMEAHYAN, KREEGER NO.:  [id number]  MEDICAL RECORD NO.:  [id number]  LOCATION:  1226                         FACILITY:  [facility]  PHYSICIAN:  Shirley Friar, MDDATE OF BIRTH:  12-15-1965  DATE OF CONSULTATION: DATE OF DISCHARGE:                                CONSULTATION   REFERRING PHYSICIAN:  Erick Blinks, MD  INDICATIONS:  Anemia.  HISTORY OF PRESENT ILLNESS:  Mr. 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  He reportedly failed this week and on evaluation for that at [facility] ER, a CT scan of his abdomen and pelvis showed a right iliac wing fracture.  It also showed esophageal dysmotility versus gastroesophageal reflux disease, constipation, hepatic steatosis.  He is unable to give any history and there is no report of any overt bleeding.  His hemoglobin on admission was 6.7 and was 10.9 in January of this year.  He was hypotensive and hypothermic.  PAST MEDICAL HISTORY: 1. Traumatic brain injury 2. Seizure disorder. 3. History of erosive esophagitis. 4. History of gastroesophageal reflux disease.  ALLERGIES:  No known drug allergies.  CURRENT MEDICATIONS:  Cogentin, Tegretol, Colace, Prozac, Haldol, Lamictal, Levaquin, NicoDerm patch, Protonix, MiraLax, Seroquel, Flomax; doses listed in hospital record.  FAMILY HISTORY:  Per admission H and P by Dr. Venetia Constable on December 10, 2010; I reviewed and I agree.  SOCIAL HISTORY:  Per admission H and P by Dr. Venetia Constable on December 10, 2010; I reviewed and I agree.  REVIEW OF SYSTEMS:  Per admission H and P by Dr. Venetia Constable on December 10, 2010; I reviewed and I agree.  PHYSICAL EXAMINATION:  VITAL SIGNS:  Temperature 97.6, pulse 108, blood pressure 113/65. GENERAL:  Sitting in chair, nonverbal, awake. ABDOMEN:  Soft, nontender,  nondistended.  LABORATORY DATA:  White blood count 5.7, hemoglobin 9.3 up from 6.7, platelet count 330, INR 1.12, lipase 12.  Other labs listed in hospital record and were reviewed.  IMPRESSION:  The patient is a 45 year old white male who is admitted with hypotension, hypothermia, severe anemia and evidence of right iliac fracture.  He is heme-negative and there is no overt bleeding.  Less likely this is a GI source of his anemia but due to history of erosive esophagitis, we will do an upper endoscopy today to look for source in his upper tract.  If his upper endoscopy is unremarkable, I will not recommend a colonoscopy in this setting unless he develops further evidence of a GI source of his anemia.     Shirley Friar, MD     VCS/MEDQ  D:  12/11/2010  T:  12/11/2010  Job:  [id number]  Electronically Signed by Charlott Rakes MD on 20-Jan-202012 04:38:15 PM

## 2011-02-20 LAB — URINALYSIS, ROUTINE W REFLEX MICROSCOPIC
Glucose, UA: NEGATIVE
Hgb urine dipstick: NEGATIVE
Protein, ur: NEGATIVE
Specific Gravity, Urine: 1.018
pH: 7

## 2011-02-20 LAB — DIFFERENTIAL
Basophils Absolute: 0
Basophils Relative: 1
Lymphocytes Relative: 32
Monocytes Absolute: 1
Neutro Abs: 3.3
Neutrophils Relative %: 47

## 2011-02-20 LAB — CBC
MCHC: 34.2
Platelets: 221
RDW: 13.2

## 2011-02-20 LAB — POCT CARDIAC MARKERS
CKMB, poc: <1 — ABNORMAL LOW
Myoglobin, poc: 31.5
Troponin i, poc: <0.05

## 2011-02-20 LAB — POCT I-STAT, CHEM 8
BUN: 14
Creatinine, Ser: 0.9
Sodium: 141
TCO2: 28

## 2011-02-27 LAB — GLUCOSE, CAPILLARY: Glucose-Capillary: 77 mg/dL (ref 70–99)

## 2011-02-27 LAB — HEPATIC FUNCTION PANEL
ALT: 68 U/L — ABNORMAL HIGH (ref 0–53)
ALT: 68 U/L — ABNORMAL HIGH (ref 0–53)
AST: 40 U/L — ABNORMAL HIGH (ref 0–37)
AST: 52 U/L — ABNORMAL HIGH (ref 0–37)
Albumin: 2.4 g/dL — ABNORMAL LOW (ref 3.5–5.2)
Alkaline Phosphatase: 72 U/L (ref 39–117)
Bilirubin, Direct: 0.1 mg/dL (ref 0.0–0.3)
Bilirubin, Direct: 0.2 mg/dL (ref 0.0–0.3)
Indirect Bilirubin: 0.2 mg/dL — ABNORMAL LOW (ref 0.3–0.9)
Indirect Bilirubin: 0.3 mg/dL (ref 0.3–0.9)
Total Bilirubin: 0.1 mg/dL — ABNORMAL LOW (ref 0.3–1.2)
Total Bilirubin: 0.4 mg/dL (ref 0.3–1.2)
Total Protein: 5 g/dL — ABNORMAL LOW (ref 6.0–8.3)
Total Protein: 5.2 g/dL — ABNORMAL LOW (ref 6.0–8.3)

## 2011-02-27 LAB — DIFFERENTIAL
Eosinophils Relative: 2 % (ref 0–5)
Lymphocytes Relative: 26 % (ref 12–46)
Lymphs Abs: 1.4 10*3/uL (ref 0.7–4.0)
Monocytes Absolute: 0.6 10*3/uL (ref 0.1–1.0)
Neutro Abs: 3.3 10*3/uL (ref 1.7–7.7)

## 2011-02-27 LAB — CULTURE, BLOOD (ROUTINE X 2)
Culture: NO GROWTH
Culture: NO GROWTH

## 2011-02-27 LAB — URINE MICROSCOPIC-ADD ON

## 2011-02-27 LAB — COMPREHENSIVE METABOLIC PANEL
ALT: 71 U/L — ABNORMAL HIGH (ref 0–53)
AST: 40 U/L — ABNORMAL HIGH (ref 0–37)
AST: 45 U/L — ABNORMAL HIGH (ref 0–37)
Albumin: 2.4 g/dL — ABNORMAL LOW (ref 3.5–5.2)
Albumin: 3.1 g/dL — ABNORMAL LOW (ref 3.5–5.2)
Alkaline Phosphatase: 78 U/L (ref 39–117)
BUN: 11 mg/dL (ref 6–23)
CO2: 29 mEq/L (ref 19–32)
CO2: 29 mEq/L (ref 19–32)
Calcium: 8.8 mg/dL (ref 8.4–10.5)
Chloride: 107 mEq/L (ref 96–112)
Chloride: 108 mEq/L (ref 96–112)
Creatinine, Ser: 0.64 mg/dL (ref 0.4–1.5)
Creatinine, Ser: 0.87 mg/dL (ref 0.4–1.5)
Creatinine, Ser: 0.92 mg/dL (ref 0.4–1.5)
GFR calc Af Amer: >60 mL/min (ref >60)
GFR calc Af Amer: >60 mL/min (ref >60)
GFR calc non Af Amer: >60 mL/min (ref >60)
GFR calc non Af Amer: >60 mL/min (ref >60)
Glucose, Bld: 81 mg/dL (ref 70–99)
Glucose, Bld: 84 mg/dL (ref 70–99)
Potassium: 5 mEq/L (ref 3.5–5.1)
Total Bilirubin: 0.6 mg/dL (ref 0.3–1.2)
Total Bilirubin: 0.7 mg/dL (ref 0.3–1.2)
Total Protein: 6 g/dL (ref 6.0–8.3)

## 2011-02-27 LAB — LEGIONELLA ANTIGEN, URINE: Legionella Antigen, Urine: NEGATIVE

## 2011-02-27 LAB — BASIC METABOLIC PANEL
BUN: 9 mg/dL (ref 6–23)
BUN: 9 mg/dL (ref 6–23)
CO2: 29 mEq/L (ref 19–32)
CO2: 30 mEq/L (ref 19–32)
CO2: 31 mEq/L (ref 19–32)
Calcium: 8.5 mg/dL (ref 8.4–10.5)
Calcium: 8.6 mg/dL (ref 8.4–10.5)
Calcium: 8.9 mg/dL (ref 8.4–10.5)
Chloride: 108 mEq/L (ref 96–112)
Creatinine, Ser: 0.7 mg/dL (ref 0.4–1.5)
Creatinine, Ser: 0.84 mg/dL (ref 0.4–1.5)
Creatinine, Ser: 0.93 mg/dL (ref 0.4–1.5)
GFR calc Af Amer: >60 mL/min (ref >60)
GFR calc Af Amer: >60 mL/min (ref >60)
GFR calc non Af Amer: >60 mL/min (ref >60)
Glucose, Bld: 71 mg/dL (ref 70–99)
Glucose, Bld: 74 mg/dL (ref 70–99)
Glucose, Bld: 79 mg/dL (ref 70–99)
Potassium: 4.6 mEq/L (ref 3.5–5.1)
Potassium: 4.8 mEq/L (ref 3.5–5.1)
Sodium: 146 mEq/L — ABNORMAL HIGH (ref 135–145)

## 2011-02-27 LAB — BLOOD GAS, ARTERIAL
Acid-Base Excess: 6.3 mmol/L — ABNORMAL HIGH (ref 0.0–2.0)
Bicarbonate: 30.8 mEq/L — ABNORMAL HIGH (ref 20.0–24.0)
O2 Saturation: 98.3 %
Patient temperature: 95.9
TCO2: 32.4 mmol/L (ref 0–100)
pO2, Arterial: 104 mmHg — ABNORMAL HIGH (ref 80.0–100.0)

## 2011-02-27 LAB — URINALYSIS, ROUTINE W REFLEX MICROSCOPIC
Bilirubin Urine: NEGATIVE
Glucose, UA: NEGATIVE mg/dL
Ketones, ur: NEGATIVE mg/dL
Nitrite: NEGATIVE
Protein, ur: NEGATIVE mg/dL

## 2011-02-27 LAB — CBC
HCT: 36.1 % — ABNORMAL LOW (ref 39.0–52.0)
MCHC: 33.3 g/dL (ref 30.0–36.0)
MCHC: 33.4 g/dL (ref 30.0–36.0)
MCHC: 33.5 g/dL (ref 30.0–36.0)
MCHC: 33.6 g/dL (ref 30.0–36.0)
MCV: 97.8 fL (ref 78.0–100.0)
MCV: 98.1 fL (ref 78.0–100.0)
Platelets: 156 10*3/uL (ref 150–400)
Platelets: 157 10*3/uL (ref 150–400)
Platelets: 158 10*3/uL (ref 150–400)
Platelets: 167 10*3/uL (ref 150–400)
RBC: 3.63 MIL/uL — ABNORMAL LOW (ref 4.22–5.81)
RDW: 13.1 % (ref 11.5–15.5)
RDW: 13.4 % (ref 11.5–15.5)
RDW: 13.6 % (ref 11.5–15.5)
WBC: 5.4 10*3/uL (ref 4.0–10.5)

## 2011-02-27 LAB — LACTIC ACID, PLASMA: Lactic Acid, Venous: 1.5 mmol/L (ref 0.5–2.2)

## 2011-02-27 LAB — VALPROIC ACID LEVEL: Valproic Acid Lvl: 23.1 ug/mL — ABNORMAL LOW (ref 50.0–100.0)

## 2011-02-27 LAB — POCT CARDIAC MARKERS
CKMB, poc: 1 ng/mL (ref 1.0–8.0)
Troponin i, poc: <0.05 ng/mL (ref 0.00–0.09)

## 2011-02-27 LAB — CK: Total CK: 1233 U/L — ABNORMAL HIGH (ref 7–232)

## 2011-02-27 LAB — PROLACTIN: Prolactin: 37.9 ng/mL — ABNORMAL HIGH (ref 2.1–17.1)

## 2011-02-27 LAB — PROTIME-INR: INR: 1.1 (ref 0.00–1.49)

## 2011-02-27 LAB — CK TOTAL AND CKMB (NOT AT ARMC): Total CK: 541 U/L — ABNORMAL HIGH (ref 7–232)

## 2011-02-27 LAB — URINE CULTURE

## 2011-02-27 LAB — POCT I-STAT 3, ART BLOOD GAS (G3+)
Acid-Base Excess: 3 mmol/L — ABNORMAL HIGH (ref 0.0–2.0)
Bicarbonate: 29.6 mEq/L — ABNORMAL HIGH (ref 20.0–24.0)
O2 Saturation: 98 %

## 2011-02-27 LAB — CARBAMAZEPINE LEVEL, TOTAL: Carbamazepine Lvl: 7.6 ug/mL (ref 4.0–12.0)

## 2011-02-27 LAB — CORTISOL: Cortisol, Plasma: 13.7 ug/dL

## 2011-02-27 LAB — MAGNESIUM: Magnesium: 2 mg/dL (ref 1.5–2.5)

## 2011-02-27 LAB — AMMONIA: Ammonia: 73 umol/L — ABNORMAL HIGH (ref 11–35)

## 2011-02-27 LAB — TSH: TSH: 2.698 u[IU]/mL (ref 0.350–4.500)

## 2011-02-27 LAB — RAPID URINE DRUG SCREEN, HOSP PERFORMED: Tetrahydrocannabinol: NOT DETECTED

## 2011-03-03 ENCOUNTER — Ambulatory Visit: Payer: Medicare Other | Admitting: Physical Therapy

## 2011-04-01 ENCOUNTER — Ambulatory Visit: Payer: Medicare Other | Attending: Family Medicine | Admitting: Physical Therapy

## 2011-04-01 DIAGNOSIS — IMO0001 Reserved for inherently not codable concepts without codable children: Secondary | ICD-10-CM | POA: Insufficient documentation

## 2011-04-01 DIAGNOSIS — M6281 Muscle weakness (generalized): Secondary | ICD-10-CM | POA: Insufficient documentation

## 2011-04-01 DIAGNOSIS — F70 Mild intellectual disabilities: Secondary | ICD-10-CM | POA: Insufficient documentation

## 2011-06-09 ENCOUNTER — Encounter (HOSPITAL_COMMUNITY): Payer: Self-pay | Admitting: *Deleted

## 2011-06-09 ENCOUNTER — Inpatient Hospital Stay (HOSPITAL_COMMUNITY)
Admission: EM | Admit: 2011-06-09 | Discharge: 2011-06-17 | DRG: 812 | Disposition: A | Discharge status: Home / Self Care | Payer: Medicare Other | Attending: Internal Medicine | Admitting: Internal Medicine

## 2011-06-09 DIAGNOSIS — I69991 Dysphagia following unspecified cerebrovascular disease: Secondary | ICD-10-CM

## 2011-06-09 DIAGNOSIS — Z862 Personal history of diseases of the blood and blood-forming organs and certain disorders involving the immune mechanism: Secondary | ICD-10-CM | POA: Diagnosis present

## 2011-06-09 DIAGNOSIS — F079 Unspecified personality and behavioral disorder due to known physiological condition: Secondary | ICD-10-CM | POA: Diagnosis present

## 2011-06-09 DIAGNOSIS — R32 Unspecified urinary incontinence: Secondary | ICD-10-CM | POA: Diagnosis present

## 2011-06-09 DIAGNOSIS — Z66 Do not resuscitate: Secondary | ICD-10-CM | POA: Diagnosis not present

## 2011-06-09 DIAGNOSIS — F329 Major depressive disorder, single episode, unspecified: Secondary | ICD-10-CM | POA: Diagnosis present

## 2011-06-09 DIAGNOSIS — G40909 Epilepsy, unspecified, not intractable, without status epilepticus: Secondary | ICD-10-CM | POA: Diagnosis present

## 2011-06-09 DIAGNOSIS — R404 Transient alteration of awareness: Secondary | ICD-10-CM | POA: Diagnosis not present

## 2011-06-09 DIAGNOSIS — R9401 Abnormal electroencephalogram [EEG]: Secondary | ICD-10-CM

## 2011-06-09 DIAGNOSIS — N401 Enlarged prostate with lower urinary tract symptoms: Secondary | ICD-10-CM | POA: Diagnosis present

## 2011-06-09 DIAGNOSIS — D649 Anemia, unspecified: Secondary | ICD-10-CM

## 2011-06-09 DIAGNOSIS — H5010 Unspecified exotropia: Secondary | ICD-10-CM | POA: Diagnosis present

## 2011-06-09 DIAGNOSIS — N39498 Other specified urinary incontinence: Secondary | ICD-10-CM | POA: Diagnosis present

## 2011-06-09 DIAGNOSIS — R1310 Dysphagia, unspecified: Secondary | ICD-10-CM | POA: Diagnosis present

## 2011-06-09 DIAGNOSIS — D500 Iron deficiency anemia secondary to blood loss (chronic): Principal | ICD-10-CM | POA: Diagnosis present

## 2011-06-09 DIAGNOSIS — E876 Hypokalemia: Secondary | ICD-10-CM | POA: Diagnosis not present

## 2011-06-09 DIAGNOSIS — Z8711 Personal history of peptic ulcer disease: Secondary | ICD-10-CM

## 2011-06-09 DIAGNOSIS — E440 Moderate protein-calorie malnutrition: Secondary | ICD-10-CM | POA: Diagnosis present

## 2011-06-09 DIAGNOSIS — N138 Other obstructive and reflux uropathy: Secondary | ICD-10-CM | POA: Diagnosis present

## 2011-06-09 DIAGNOSIS — Z23 Encounter for immunization: Secondary | ICD-10-CM

## 2011-06-09 DIAGNOSIS — G40309 Generalized idiopathic epilepsy and epileptic syndromes, not intractable, without status epilepticus: Secondary | ICD-10-CM | POA: Diagnosis present

## 2011-06-09 DIAGNOSIS — Z800 Family history of malignant neoplasm of digestive organs: Secondary | ICD-10-CM

## 2011-06-09 DIAGNOSIS — S069XAS Unspecified intracranial injury with loss of consciousness status unknown, sequela: Secondary | ICD-10-CM

## 2011-06-09 DIAGNOSIS — K2100 Gastro-esophageal reflux disease with esophagitis, without bleeding: Secondary | ICD-10-CM | POA: Diagnosis present

## 2011-06-09 DIAGNOSIS — K319 Disease of stomach and duodenum, unspecified: Secondary | ICD-10-CM | POA: Diagnosis present

## 2011-06-09 DIAGNOSIS — X58XXXS Exposure to other specified factors, sequela: Secondary | ICD-10-CM

## 2011-06-09 DIAGNOSIS — K219 Gastro-esophageal reflux disease without esophagitis: Secondary | ICD-10-CM | POA: Diagnosis present

## 2011-06-09 DIAGNOSIS — K208 Other esophagitis without bleeding: Secondary | ICD-10-CM | POA: Diagnosis present

## 2011-06-09 DIAGNOSIS — S069X9S Unspecified intracranial injury with loss of consciousness of unspecified duration, sequela: Secondary | ICD-10-CM

## 2011-06-09 DIAGNOSIS — M4000 Postural kyphosis, site unspecified: Secondary | ICD-10-CM | POA: Diagnosis present

## 2011-06-09 DIAGNOSIS — K449 Diaphragmatic hernia without obstruction or gangrene: Secondary | ICD-10-CM | POA: Diagnosis present

## 2011-06-09 DIAGNOSIS — F70 Mild intellectual disabilities: Secondary | ICD-10-CM | POA: Diagnosis present

## 2011-06-09 DIAGNOSIS — F3289 Other specified depressive episodes: Secondary | ICD-10-CM | POA: Diagnosis present

## 2011-06-09 DIAGNOSIS — R269 Unspecified abnormalities of gait and mobility: Secondary | ICD-10-CM | POA: Diagnosis present

## 2011-06-09 DIAGNOSIS — Z8639 Personal history of other endocrine, nutritional and metabolic disease: Secondary | ICD-10-CM | POA: Diagnosis present

## 2011-06-09 DIAGNOSIS — G939 Disorder of brain, unspecified: Secondary | ICD-10-CM

## 2011-06-09 DIAGNOSIS — F0789 Other personality and behavioral disorders due to known physiological condition: Secondary | ICD-10-CM | POA: Diagnosis present

## 2011-06-09 HISTORY — DX: Ulcer of esophagus without bleeding: K22.10

## 2011-06-09 HISTORY — DX: Unspecified protein-calorie malnutrition: E46

## 2011-06-09 HISTORY — DX: Nicotine dependence, unspecified, uncomplicated: F17.200

## 2011-06-09 HISTORY — DX: Benign prostatic hyperplasia without lower urinary tract symptoms: N40.0

## 2011-06-09 LAB — TYPE AND SCREEN
ABO/RH(D): O POS
Antibody Screen: NEGATIVE
Unit division: 0
Unit division: 0

## 2011-06-09 LAB — BASIC METABOLIC PANEL
BUN: 17 mg/dL (ref 6–23)
CO2: 29 mEq/L (ref 19–32)
Calcium: 8.9 mg/dL (ref 8.4–10.5)
Chloride: 104 mEq/L (ref 96–112)
Creatinine, Ser: 0.89 mg/dL (ref 0.50–1.35)
GFR calc Af Amer: >90 mL/min (ref >90)
GFR calc non Af Amer: >90 mL/min (ref >90)
Glucose, Bld: 86 mg/dL (ref 70–99)
Potassium: 3.8 mEq/L (ref 3.5–5.1)
Sodium: 139 mEq/L (ref 135–145)

## 2011-06-09 LAB — CBC
HCT: 25.2 % — ABNORMAL LOW (ref 39.0–52.0)
Hemoglobin: 7.1 g/dL — ABNORMAL LOW (ref 13.0–17.0)
MCH: 20.4 pg — ABNORMAL LOW (ref 26.0–34.0)
MCHC: 28.2 g/dL — ABNORMAL LOW (ref 30.0–36.0)
MCV: 72.4 fL — ABNORMAL LOW (ref 78.0–100.0)
Platelets: 389 10*3/uL (ref 150–400)
RBC: 3.48 MIL/uL — ABNORMAL LOW (ref 4.22–5.81)
RDW: 17.5 % — ABNORMAL HIGH (ref 11.5–15.5)
WBC: 3.9 10*3/uL — ABNORMAL LOW (ref 4.0–10.5)

## 2011-06-09 LAB — PREPARE RBC (CROSSMATCH)

## 2011-06-09 LAB — OCCULT BLOOD, POC DEVICE: Fecal Occult Bld: NEGATIVE

## 2011-06-09 MED ORDER — PNEUMOCOCCAL VAC POLYVALENT 25 MCG/0.5ML IJ INJ
0.5000 mL | INJECTION | INTRAMUSCULAR | Status: AC
  Administered 2011-06-10: 0.5 mL via INTRAMUSCULAR
  Filled 2011-06-09: qty 0.5

## 2011-06-09 MED ORDER — INFLUENZA VIRUS VACC SPLIT PF IM SUSP
0.5000 mL | INTRAMUSCULAR | Status: AC
  Administered 2011-06-10: 0.5 mL via INTRAMUSCULAR
  Filled 2011-06-09: qty 0.5

## 2011-06-09 NOTE — H&P (Signed)
History and Physical Examination  Date: 06/09/2011  Patient name: [name] Medical record number: [id number] Date of birth: 1966-04-27 Age: 46 y.o. Gender: male PCP: [name], MD  Attending physician: Clint Lipps  Historians: ER physician, ER staff, old records  Chief Complaint  Patient presents with  . Anemia     History of Present Illness: [name] is an 46 y.o. male with a history of traumatic brain injury and subsequent mental retardation and history of gastrectomy was having a routine medical check up evaluation today when he was discovered to have a recurrent anemia with a hemoglobin of 7 in the office and sent to the ED for evaluation.  The patient has a history of a distal esophageal ulcer that was diagnosed in July 2012.  He was found to be anemic at that time and treated.  He was transfused and started on protonix and supplemental iron.  He had been doing well and no problems were reported.  His caretakers report that he has not been having regular melena but may have had an episode in the past.  The patient has been complaining of being cold and has been pale and has had some fatigue.  None of his symptoms have been severe or alarming to cause concern for the caretakers.  He was found to be heme negative in the ER.  He was also hemoccult negative when he was evaluated in July 2012.  Hospital admission was requested for further evaluation and treatment.    Past Medical History Past Medical History  Diagnosis Date  . 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 Depression   . 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 Closed head injury   . Seizures   . Peptic ulcer disease   . Esophageal ulcer   . Malnutrition   . Constipation   . BPH (benign prostatic hyperplasia)   . Smoker   history of severe esophagitis  Past Surgical History Past Surgical History  Procedure Date  . Gastrectomy     Home Meds: Prior to  Admission medications   Medication Sig Start Date End Date Taking? Authorizing Provider  benztropine (COGENTIN) 1 MG tablet Take 1 mg by mouth 2 (two) times daily.     Yes Historical Provider, MD  calcium citrate-vitamin D (CITRACAL+D) 315-200 MG-UNIT per tablet Take 1 tablet by mouth 2 (two) times daily.     Yes Historical Provider, MD  carbamazepine (CARBATROL) 300 MG 12 hr capsule Take 300 mg by mouth 2 (two) times daily.     Yes Historical Provider, MD  clonazePAM (KLONOPIN) 0.5 MG tablet Take 0.5 mg by mouth 2 (two) times daily as needed.     Yes Historical Provider, MD  clonazePAM (KLONOPIN) 0.5 MG tablet Take 0.5 mg by mouth 2 (two) times daily as needed.     Yes Historical Provider, MD  FLUoxetine (PROZAC) 40 MG capsule Take 40 mg by mouth daily.     Yes Historical Provider, MD  haloperidol (HALDOL) 5 MG tablet Take 5 mg by mouth 2 (two) times daily.     Yes Historical Provider, MD  lamoTRIgine (LAMICTAL) 100 MG tablet Take 100 mg by mouth daily.     Yes Historical Provider, MD  Olopatadine HCl (PATADAY) 0.2 % SOLN Apply 1 drop to eye daily as needed. Red eyes   Yes Historical Provider, MD  QUEtiapine (SEROQUEL) 400 MG tablet Take 400 mg by mouth at bedtime.  Yes Historical Provider, MD  Tamsulosin HCl (FLOMAX) 0.4 MG CAPS Take 0.4 mg by mouth daily.    Yes Historical Provider, MD  LORazepam (ATIVAN) 2 MG tablet Take 2 mg by mouth every 6 (six) hours as needed.      Historical Provider, MD    Allergies: Pineapple  Social History:  History   Social History  . Marital Status: Single    Spouse Name: N/A    Number of Children: N/A  . Years of Education: N/A   Occupational History  . Not on file.   Social History Main Topics  . Smoking status: Current Everyday Smoker -- 0.5 packs/day  . Smokeless tobacco: Not on file  . Alcohol Use: No  . Drug Use: No  . Sexually Active:    Other Topics Concern  . Not on file   Social History Narrative  . No narrative on file    Family History:  The patient does have family in [place] area who check in on him and he also has a caretaker that cares for him daily.  He has a different caretaker now than when he was admitted in July 2012.   Review of Systems: Review of systems not obtained due to patient factors. Please see HPI   Physical Exam: Blood pressure 97/57, pulse 70, temperature 97.8 F (36.6 C), temperature source Oral, resp. rate 20, weight 61.236 kg (135 lb), SpO2 99.00%. Gen: Pt curled up in bed in fetal position, No distress, complains of being cold HEENT: NCAT, neck supple, thyroid soft, no nodules palpated Lungs: BBS shallow - poor insp effort CV:  Normal s1, s2 sounds Abd: soft, nondistended, nontender, no masses palpated, normal BS Ext: no pretibial edema Skin: pale diffuse, no lesions seen Neuro: pt mentally retarded from brain injury, pt not able to ambulate at this time   Lab  And Imaging results:  Results for orders placed during the hospital encounter of 06/09/11 (from the past 24 hour(s))  CBC     Status: Abnormal   Collection Time   06/09/11  4:40 PM      Component Value Range   WBC 3.9 (*) 4.0 - 10.5 (K/uL)   RBC 3.48 (*) 4.22 - 5.81 (MIL/uL)   Hemoglobin 7.1 (*) 13.0 - 17.0 (g/dL)   HCT 98.1 (*) 19.1 - 52.0 (%)   MCV 72.4 (*) 78.0 - 100.0 (fL)   MCH 20.4 (*) 26.0 - 34.0 (pg)   MCHC 28.2 (*) 30.0 - 36.0 (g/dL)   RDW 47.8 (*) 29.5 - 15.5 (%)   Platelets 389  150 - 400 (K/uL)  TYPE AND SCREEN     Status: Normal   Collection Time   06/09/11  4:40 PM      Component Value Range   ABO/RH(D) O POS     Antibody Screen NEG     Sample Expiration 06/12/2011    BASIC METABOLIC PANEL     Status: Normal   Collection Time   06/09/11  4:40 PM      Component Value Range   Sodium 139  135 - 145 (mEq/L)   Potassium 3.8  3.5 - 5.1 (mEq/L)   Chloride 104  96 - 112 (mEq/L)   CO2 29  19 - 32 (mEq/L)   Glucose, Bld 86  70 - 99 (mg/dL)   BUN 17  6 - 23 (mg/dL)   Creatinine, Ser 6.21  0.50  - 1.35 (mg/dL)   Calcium 8.9  8.4 - 30.8 (mg/dL)   GFR  calc non Af Amer >90  >90 (mL/min)   GFR calc Af Amer >90  >90 (mL/min)  OCCULT BLOOD, POC DEVICE     Status: Normal   Collection Time   06/09/11  6:27 PM      Component Value Range   Fecal Occult Bld NEGATIVE     EKG Results:  Orders placed during the hospital encounter of 12/10/10  . 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  The patient is going to be admitted into the hospital for a new anemia workup.  Will consult GI Dr. Marge Duncans group [facility]) because he saw him in July and provided the EGD.  He may also need a colonoscopy.  Will continue to hemoccult stools.  Will transfuse 2 units of PRBCs.  Will also have him to continue protonix and take twice daily until his upper endoscopy study has been performed.  Will monitor his hemodynamics because he has had a history of hypotension secondary to anemia in the past.   Please see orders.  Resume normal home medications.     Standley Dakins MD [facility] [place], [place] [phone] 06/09/2011, 7:09 PM

## 2011-06-09 NOTE — ED Provider Notes (Signed)
History    45yM referred by PCP for further evaluation of hemoglobin of 7.0. Most of history obtained via old records. Pt with hx of iron deficiency anemia. Admit 7/12 for anemia and also found to have esophageal ulceration which likely contributing at least somewhat. Transfused during that admission and on prilosec and iron supplementation. Pt with hx of traumatic brain injury and doesn't provide much useful history. Does say havign some abdominal pain. Per caretaker, Gus Manson Passey, no evidence of bleeding that he has noticed. No melena. Always looks pale. Dr appointment today was apparently routine.  CSN: [id number]  Arrival date & time 06/09/11  1546   First MD Initiated Contact with Patient 06/09/11 1607      Chief Complaint  Patient presents with  . Anemia    (Consider location/radiation/quality/duration/timing/severity/associated sxs/prior treatment) HPI  Past Medical History  Diagnosis Date  . 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 Depression   . 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 Closed head injury   . Seizures   . Peptic ulcer disease     Past Surgical History  Procedure Date  . Gastrectomy     No family history on file.  History  Substance Use Topics  . Smoking status: Current Everyday Smoker -- 0.5 packs/day  . Smokeless tobacco: Not on file  . Alcohol Use: No      Review of Systems  Level 5 caveat applies because pt with hx of traumatic brain injury and history limited.  Allergies  Pineapple  Home Medications   Current Outpatient Rx  Name Route Sig Dispense Refill  . CLONAZEPAM 0.5 MG PO TABS Oral Take 0.5 mg by mouth 2 (two) times daily as needed.      Marland Kitchen CLONAZEPAM 0.5 MG PO TABS Oral Take 0.5 mg by mouth 2 (two) times daily as needed.      Marland Kitchen BENZTROPINE MESYLATE 1 MG PO TABS Oral Take 1 mg by mouth 2 (two) times daily.      Marland Kitchen CALCIUM CITRATE-VITAMIN D 315-200 MG-UNIT PO TABS Oral Take 1 tablet by  mouth 2 (two) times daily.      Marland Kitchen CARBAMAZEPINE ER 300 MG PO CP12 Oral Take 300 mg by mouth 2 (two) times daily.      Marland Kitchen FLUOXETINE HCL 40 MG PO CAPS Oral Take 40 mg by mouth daily.      Marland Kitchen HALOPERIDOL 5 MG PO TABS Oral Take 5 mg by mouth 2 (two) times daily.      Marland Kitchen LAMOTRIGINE 100 MG PO TABS Oral Take 100 mg by mouth daily.      Marland Kitchen LAMOTRIGINE 200 MG PO TABS Oral Take 200 mg by mouth daily.      Marland Kitchen LORAZEPAM 2 MG PO TABS Oral Take 2 mg by mouth every 6 (six) hours as needed.      . OLOPATADINE HCL 0.2 % OP SOLN Ophthalmic Apply to eye.      Marland Kitchen OMEPRAZOLE 20 MG PO CPDR Oral Take 20 mg by mouth daily.      . QUETIAPINE FUMARATE 400 MG PO TABS Oral Take 400 mg by mouth at bedtime.      . TAMSULOSIN HCL 0.4 MG PO CAPS Oral Take by mouth.        BP 123/45  Pulse 77  Temp(Src) 97.8 F (36.6 C) (Oral)  Resp 17  Wt 135 lb (61.236 kg)  SpO2 100%  Physical Exam  Nursing note and vitals reviewed. Constitutional: No  distress.       Sitting in wheelchair. NAD. Frail appearing. Pale.  HENT:  Head: Normocephalic and atraumatic.  Eyes: Pupils are equal, round, and reactive to light. Right eye exhibits no discharge. Left eye exhibits no discharge.       Pale conjunctiva  Neck: Neck supple.  Cardiovascular: Normal rate, regular rhythm and normal heart sounds.  Exam reveals no gallop and no friction rub.   No murmur heard. Pulmonary/Chest: Effort normal and breath sounds normal. No respiratory distress.  Abdominal: Soft. He exhibits no distension. There is no tenderness.  Musculoskeletal: He exhibits no edema and no tenderness.  Neurological: He is alert. He exhibits abnormal muscle tone.       Speech slow but understandable. Extremities spastic.   Skin: Skin is warm and dry.  Psychiatric:       Answers most yes/no questions approrpriately.    ED Course  Procedures (including critical care time)  Labs Reviewed  CBC - Abnormal; Notable for the following:    WBC 3.9 (*)    RBC 3.48 (*)     Hemoglobin 7.1 (*)    HCT 25.2 (*)    MCV 72.4 (*)    MCH 20.4 (*)    MCHC 28.2 (*)    RDW 17.5 (*)    All other components within normal limits  CBC - Abnormal; Notable for the following:    WBC 3.4 (*)    Hemoglobin 10.1 (*)    HCT 33.2 (*)    MCV 75.1 (*)    MCH 22.9 (*)    RDW 17.9 (*)    All other components within normal limits  CBC - Abnormal; Notable for the following:    Hemoglobin 10.5 (*)    HCT 34.9 (*)    MCV 74.9 (*)    MCH 22.5 (*)    RDW 18.3 (*)    All other components within normal limits  BASIC METABOLIC PANEL - Abnormal; Notable for the following:    Potassium 3.4 (*)    CO2 15 (*)    Glucose, Bld 115 (*)    All other components within normal limits  GLUCOSE, CAPILLARY - Abnormal; Notable for the following:    Glucose-Capillary 126 (*)    All other components within normal limits  BLOOD GAS, ARTERIAL - Abnormal; Notable for the following:    pO2, Arterial 147.0 (*)    All other components within normal limits  CBC - Abnormal; Notable for the following:    Hemoglobin 9.6 (*)    HCT 31.9 (*)    MCV 75.1 (*)    MCH 22.6 (*)    RDW 18.6 (*)    All other components within normal limits  BASIC METABOLIC PANEL - Abnormal; Notable for the following:    BUN 4 (*)    All other components within normal limits  CBC - Abnormal; Notable for the following:    Hemoglobin 9.8 (*)    HCT 33.2 (*)    MCV 75.5 (*)    MCH 22.3 (*)    MCHC 29.5 (*)    RDW 19.2 (*)    All other components within normal limits  VALPROIC ACID LEVEL - Abnormal; Notable for the following:    Valproic Acid Lvl 38.9 (*)    All other components within normal limits  BASIC METABOLIC PANEL - Abnormal; Notable for the following:    BUN 5 (*)    All other components within normal limits  CBC - Abnormal; Notable  for the following:    Hemoglobin 9.6 (*)    HCT 33.3 (*)    MCV 77.4 (*)    MCH 22.3 (*)    MCHC 28.8 (*)    RDW 21.5 (*)    All other components within normal limits  TYPE  AND SCREEN  BASIC METABOLIC PANEL  OCCULT BLOOD, POC DEVICE  PREPARE RBC (CROSSMATCH)  BASIC METABOLIC PANEL  URINALYSIS, ROUTINE W REFLEX MICROSCOPIC  TSH  MRSA PCR SCREENING  CARBAMAZEPINE LEVEL, TOTAL  BASIC METABOLIC PANEL  PREALBUMIN  MAGNESIUM  OCCULT BLOOD X 1 CARD TO LAB, STOOL  BASIC METABOLIC PANEL  MAGNESIUM  OCCULT BLOOD X 1 CARD TO LAB, STOOL  POCT OCCULT BLOOD STOOL, DEVICE  CREATININE, SERUM   No results found.   1. Anemia   2. SEIZURE, GRAND MAL, POST BRAIN INJURY   3. HX SEVERE CLOSED HEAD INJURY       MDM  46 year old male with anemia. Hemoglobin is 7.0. Patient has a history of iron deficiency anemia and also GI bleed thought secondary to a esophageal ulcer. No clinical evidence of ongoing bleeding at this time. Given such low hemoglobin though we'll admit for further evaluation.       Raeford Razor, MD 06/17/11 [phone]

## 2011-06-09 NOTE — ED Notes (Signed)
Pt sent here from MD's office d/t 7.0 Hgb.

## 2011-06-09 NOTE — ED Notes (Signed)
Family calls, Gus, [phone], request contact when pt is assigned to a room

## 2011-06-09 NOTE — ED Notes (Signed)
Contacted lab r/t blood products, no reason given on delay, products are ready, will begin transfusion when available

## 2011-06-09 NOTE — ED Notes (Signed)
Consent obtained to transfuse blood products, Lab contacted, states they will contact this RN when blood is available

## 2011-06-10 LAB — URINALYSIS, ROUTINE W REFLEX MICROSCOPIC
Glucose, UA: NEGATIVE mg/dL
Ketones, ur: NEGATIVE mg/dL
Leukocytes, UA: NEGATIVE
Nitrite: NEGATIVE
Protein, ur: NEGATIVE mg/dL
Urobilinogen, UA: 0.2 mg/dL (ref 0.0–1.0)

## 2011-06-10 LAB — BASIC METABOLIC PANEL
BUN: 12 mg/dL (ref 6–23)
Calcium: 9.1 mg/dL (ref 8.4–10.5)
GFR calc Af Amer: >90 mL/min (ref >90)
GFR calc non Af Amer: >90 mL/min (ref >90)
Potassium: 3.7 mEq/L (ref 3.5–5.1)
Sodium: 141 mEq/L (ref 135–145)

## 2011-06-10 LAB — CBC
Hemoglobin: 10.1 g/dL — ABNORMAL LOW (ref 13.0–17.0)
MCHC: 30.4 g/dL (ref 30.0–36.0)
Platelets: 345 10*3/uL (ref 150–400)

## 2011-06-10 LAB — GLUCOSE, CAPILLARY: Glucose-Capillary: 126 mg/dL — ABNORMAL HIGH (ref 70–99)

## 2011-06-10 LAB — TSH: TSH: 0.608 u[IU]/mL (ref 0.350–4.500)

## 2011-06-10 MED ORDER — CARBAMAZEPINE ER 300 MG PO CP12
300.0000 mg | ORAL_CAPSULE | Freq: Two times a day (BID) | ORAL | Status: DC
Start: 2011-06-10 — End: 2011-06-10
  Filled 2011-06-10 (×2): qty 1

## 2011-06-10 MED ORDER — BOOST / RESOURCE BREEZE PO LIQD
1.0000 | Freq: Three times a day (TID) | ORAL | Status: DC
  Administered 2011-06-10 – 2011-06-15 (×8): 1 via ORAL

## 2011-06-10 MED ORDER — TAMSULOSIN HCL 0.4 MG PO CAPS
0.4000 mg | ORAL_CAPSULE | Freq: Every day | ORAL | Status: DC
  Administered 2011-06-10 – 2011-06-17 (×8): 0.4 mg via ORAL
  Filled 2011-06-10 (×9): qty 1

## 2011-06-10 MED ORDER — STARCH (THICKENING) PO POWD
ORAL | Status: DC | PRN
  Filled 2011-06-10 (×3): qty 227

## 2011-06-10 MED ORDER — ENOXAPARIN SODIUM 40 MG/0.4ML ~~LOC~~ SOLN
40.0000 mg | SUBCUTANEOUS | Status: DC
  Administered 2011-06-10: 40 mg via SUBCUTANEOUS
  Filled 2011-06-10 (×2): qty 0.4

## 2011-06-10 MED ORDER — PANTOPRAZOLE SODIUM 40 MG PO TBEC
40.0000 mg | DELAYED_RELEASE_TABLET | Freq: Two times a day (BID) | ORAL | Status: DC
  Administered 2011-06-10 (×2): 40 mg via ORAL
  Filled 2011-06-10 (×5): qty 1

## 2011-06-10 MED ORDER — SODIUM CHLORIDE 0.9 % IJ SOLN
3.0000 mL | INTRAMUSCULAR | Status: DC | PRN
  Administered 2011-06-10: 3 mL via INTRAVENOUS

## 2011-06-10 MED ORDER — LAMOTRIGINE 100 MG PO TABS
100.0000 mg | ORAL_TABLET | Freq: Every day | ORAL | Status: DC
  Administered 2011-06-10: 100 mg via ORAL
  Filled 2011-06-10 (×2): qty 1

## 2011-06-10 MED ORDER — SODIUM CHLORIDE 0.9 % IV SOLN
250.0000 mL | INTRAVENOUS | Status: DC | PRN
  Administered 2011-06-15: 250 mL via INTRAVENOUS

## 2011-06-10 MED ORDER — SENNOSIDES-DOCUSATE SODIUM 8.6-50 MG PO TABS
1.0000 | ORAL_TABLET | Freq: Every evening | ORAL | Status: DC | PRN
  Filled 2011-06-10: qty 1

## 2011-06-10 MED ORDER — SODIUM CHLORIDE 0.9 % IJ SOLN
3.0000 mL | Freq: Two times a day (BID) | INTRAMUSCULAR | Status: DC
  Administered 2011-06-10 – 2011-06-16 (×10): 3 mL via INTRAVENOUS

## 2011-06-10 MED ORDER — BENZTROPINE MESYLATE 1 MG PO TABS
1.0000 mg | ORAL_TABLET | Freq: Two times a day (BID) | ORAL | Status: DC
  Administered 2011-06-10 (×2): 1 mg via ORAL
  Filled 2011-06-10 (×4): qty 1

## 2011-06-10 MED ORDER — FLUOXETINE HCL 40 MG PO CAPS
40.0000 mg | ORAL_CAPSULE | Freq: Every day | ORAL | Status: DC
  Administered 2011-06-10 – 2011-06-17 (×8): 40 mg via ORAL
  Filled 2011-06-10 (×8): qty 1

## 2011-06-10 MED ORDER — CARBAMAZEPINE ER 200 MG PO TB12
300.0000 mg | ORAL_TABLET | Freq: Two times a day (BID) | ORAL | Status: DC
  Administered 2011-06-10 (×2): 300 mg via ORAL
  Filled 2011-06-10 (×4): qty 1

## 2011-06-10 MED ORDER — ACETAMINOPHEN 325 MG PO TABS: 650.0000 mg | ORAL_TABLET | Freq: Four times a day (QID) | ORAL | Status: DC | PRN

## 2011-06-10 MED ORDER — HALOPERIDOL 5 MG PO TABS
5.0000 mg | ORAL_TABLET | Freq: Two times a day (BID) | ORAL | Status: DC
  Administered 2011-06-10 – 2011-06-16 (×13): 5 mg via ORAL
  Filled 2011-06-10 (×15): qty 1

## 2011-06-10 MED ORDER — ACETAMINOPHEN 650 MG RE SUPP
650.0000 mg | Freq: Four times a day (QID) | RECTAL | Status: DC | PRN
  Administered 2011-06-11: 650 mg via RECTAL
  Filled 2011-06-10: qty 1

## 2011-06-10 MED ORDER — QUETIAPINE FUMARATE 400 MG PO TABS
400.0000 mg | ORAL_TABLET | Freq: Every day | ORAL | Status: DC
  Administered 2011-06-10 – 2011-06-14 (×5): 400 mg via ORAL
  Filled 2011-06-10 (×6): qty 1

## 2011-06-10 MED ORDER — LORAZEPAM 1 MG PO TABS: 2.0000 mg | ORAL_TABLET | Freq: Four times a day (QID) | ORAL | Status: DC | PRN

## 2011-06-10 NOTE — ED Notes (Signed)
Verified through bed placement that pt still waiting for inpt bed @ [facility], cont to monitor

## 2011-06-10 NOTE — ED Notes (Signed)
Pt receiving blood transfusion. Will get labs when finished.

## 2011-06-10 NOTE — Progress Notes (Signed)
INITIAL ADULT NUTRITION ASSESSMENT Date: 06/10/2011   Time: 3:28 PM Reason for Assessment: consult  ASSESSMENT: Male 46 y.o.  Dx: Iron deficiency anemia due to chronic blood loss  Hx:  Past Medical History  Diagnosis Date  . 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 Depression   . 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 Closed head injury   . Seizures   . Peptic ulcer disease   . Esophageal ulcer   . Malnutrition   . Constipation   . BPH (benign prostatic hyperplasia)   . Smoker    Past Surgical History  Procedure Date  . Gastrectomy     Related Meds:  Scheduled Meds:   . benztropine  1 mg Oral BID  . carbamazepine  300 mg Oral BID  . enoxaparin  40 mg Subcutaneous Q24H  . FLUoxetine  40 mg Oral Daily  . haloperidol  5 mg Oral BID  . influenza  inactive virus vaccine  0.5 mL Intramuscular Tomorrow-1000  . lamoTRIgine  100 mg Oral Daily  . pantoprazole  40 mg Oral BID AC  . pneumococcal 23 valent vaccine  0.5 mL Intramuscular Tomorrow-1000  . QUEtiapine  400 mg Oral QHS  . sodium chloride  3 mL Intravenous Q12H  . Tamsulosin HCl  0.4 mg Oral Daily  . DISCONTD: carbamazepine  300 mg Oral BID   Continuous Infusions:  PRN Meds:.sodium chloride, acetaminophen, acetaminophen, LORazepam, senna-docusate, sodium chloride   Ht: 6\' 1"  (185.4 cm)  Wt: 118 lb 9.7 oz (53.8 kg)  Ideal Wt: 75.4 kg % Ideal Wt: 71%  Usual Wt: Last known wt in July 2012, 57.3 kg % Usual Wt: 93.8 kg  Body mass index is 15.65 kg/(m^2).  Food/Nutrition Related Hx:  Pt admitted with blood loss anemia.  Pt with h/o TBI, gastrectomy (date unknown), PUD.  Pt from group home in July, chart notes different caretaker reported this admission.  RD familiar with pt from July at which time pt caregiver reported pt to be eating Regular diet with thickened liquids.  Pt with decreased muscle tone, thin in appearance likely due to TBI.  Labs:  CMP     Component  Value Date/Time   NA 141 06/10/2011 0640   K 3.7 06/10/2011 0640   CL 104 06/10/2011 0640   CO2 28 06/10/2011 0640   GLUCOSE 79 06/10/2011 0640   BUN 12 06/10/2011 0640   CREATININE 0.88 06/10/2011 0640   CALCIUM 9.1 06/10/2011 0640   PROT 6.2 12/11/2010 0331   ALBUMIN 2.7* 12/11/2010 0331   AST 28 12/11/2010 0331   ALT 29 12/11/2010 0331   ALKPHOS 106 12/11/2010 0331   BILITOT 0.3 12/11/2010 0331   GFRNONAA >90 06/10/2011 0640   GFRAA >90 06/10/2011 0640    Intake:  Output:  Intake/Output Summary (Last 24 hours) at 06/10/11 1533 Last data filed at 06/10/11 0345  Gross per 24 hour  Intake   1700 ml  Output      0 ml  Net   1700 ml      Diet Order: Clear Liquid  Supplements/Tube Feeding: none at this time  IVF:    Estimated Nutritional Needs:   Kcal: 1650-1760 kcal Protein: 66-77g Fluid: >1.7 L/day  NUTRITION DIAGNOSIS: -Inadequate oral intake (NI-2.1).  Status: Ongoing  RELATED TO: omission of energy dense foods  AS EVIDENCE BY: pt on clear liquids, unsupplemented diet  MONITORING/EVALUATION(Goals): 1.  Food/Beverage; improvement in intake once diet advanced  EDUCATION NEEDS: -  No education needs identified at this time  INTERVENTION: 1.  Supplements; pt on clear liquids, not thickened at this time.  Resource Breeze BID while pt performing bowel prep for colonoscopy. 2.  Head/Neck; if signs of ill tolerance, consider consult to SLP.  Dietitian [phone]  DOCUMENTATION CODES Per approved criteria  -Underweight    [name] 06/10/2011, 3:28 PM

## 2011-06-10 NOTE — ED Notes (Signed)
Hospital bed requested for pt comfort, cont to monitor

## 2011-06-10 NOTE — Progress Notes (Signed)
[name]  ZOX:[id number]  DOB: [birth year]  DOA: 06/09/2011  PCP: [name], MD  Subjective: No specific complaints per patient, status post blood transfusion, no active GI bleed.  Objective: Weight change:   Intake/Output Summary (Last 24 hours) at 06/10/11 1513 Last data filed at 06/10/11 0345  Gross per 24 hour  Intake   1700 ml  Output      0 ml  Net   1700 ml   Blood pressure 106/68, pulse 68, temperature 98 F (36.7 C), temperature source Oral, resp. rate 18, height 6\' 1"  (1.854 m), weight 53.8 kg (118 lb 9.7 oz), SpO2 98.00%.  Physical Exam: General: Alert and awake, not in any acute distress. 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  Medications: Scheduled Meds:   . benztropine  1 mg Oral BID  . carbamazepine  300 mg Oral BID  . enoxaparin  40 mg Subcutaneous Q24H  . FLUoxetine  40 mg Oral Daily  . haloperidol  5 mg Oral BID  . influenza  inactive virus vaccine  0.5 mL Intramuscular Tomorrow-1000  .  lamoTRIgine  100 mg Oral Daily  . pantoprazole  40 mg Oral BID AC  . pneumococcal 23 valent vaccine  0.5 mL Intramuscular Tomorrow-1000  . QUEtiapine  400 mg Oral QHS  . sodium chloride  3 mL Intravenous Q12H  . Tamsulosin HCl  0.4 mg Oral Daily  . DISCONTD: carbamazepine  300 mg Oral BID   Continuous Infusions:    Assessment/Plan: Principal Problem:  *Iron deficiency anemia due to chronic blood loss/ history of distal esophageal ulcer/ family history of colon cancer: - Status post blood transfusion, H&H stable, no active GI bleeding, continue PPI - Discussed in detail with Dr. Rochele Raring [facility] gastroenterology), plans for endoscopy (upper and lower) due to history of esophageal ulcer and family history of colon cancer. Change diet to clear liquid diet per GI recommendations.  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 [facility] 06/10/2011, 3:13 PM

## 2011-06-10 NOTE — Consult Note (Signed)
Subjective:   HPI  The patient is a 46 year old male who we're . asked to see in consultation in regards to anemia. The patient lives in a group home and was found to be anemic with a hemoglobin of around 7. He was sent to the emergency room he was transfused packed red cells and now his hemoglobin is up to around 10. There is no report of any active gastrointestinal bleeding and his stool has been Hemoccult negative. The patient was anemic last year and had an EGD done which showed a distal esophageal ulcer. The patient is mentally challenged and is unable to give any significant information. I did speak to his mother on the telephone today. His mother is Depaul Stephens and her phone number is [phone]. She was actually unaware that he was admitted to the hospital. She told me that she was his guardian. She mentioned that the patient's grandmother had colon cancer. The patient has never had a colonoscopy. There is mention on the chart that the patient has had a prior partial gastrectomy however the patient mother is unaware of this. Review of Systems Unable to obtain  Past Medical History  Diagnosis Date  . 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 Depression   . 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 Closed head injury   . Seizures   . Peptic ulcer disease   . Esophageal ulcer   . Malnutrition   . Constipation   . BPH (benign prostatic hyperplasia)   . Smoker    Past Surgical History  Procedure Date  . Gastrectomy    History   Social History  . Marital Status: Single    Spouse Name: N/A    Number of Children: N/A  . Years of Education: N/A   Occupational History  . Not on file.   Social History Main Topics  . Smoking status: Current Everyday Smoker -- 0.5 packs/day  . Smokeless tobacco: Former Neurosurgeon    Quit date: 06/08/2001  . Alcohol Use: No  . Drug Use: No  . Sexually Active:    Other Topics Concern  . Not on file   Social  History Narrative  . No narrative on file   family history is not on file. Current facility-administered medications:0.9 %  sodium chloride infusion, 250 mL, Intravenous, PRN, Clanford Cyndie Mull, MD;  acetaminophen (TYLENOL) suppository 650 mg, 650 mg, Rectal, Q6H PRN, Clanford Cyndie Mull, MD;  acetaminophen (TYLENOL) tablet 650 mg, 650 mg, Oral, Q6H PRN, Clanford Cyndie Mull, MD;  benztropine (COGENTIN) tablet 1 mg, 1 mg, Oral, BID, Clanford Cyndie Mull, MD, 1 mg at 06/10/11 1424 carbamazepine (TEGRETOL XR) 12 hr tablet 300 mg, 300 mg, Oral, BID, Clanford L Johnson, MD, 300 mg at 06/10/11 1013;  enoxaparin (LOVENOX) injection 40 mg, 40 mg, Subcutaneous, Q24H, Clanford L Johnson, MD, 40 mg at 06/10/11 1017;  FLUoxetine (PROZAC) capsule 40 mg, 40 mg, Oral, Daily, Clanford L Johnson, MD, 40 mg at 06/10/11 1010;  haloperidol (HALDOL) tablet 5 mg, 5 mg, Oral, BID, Clanford L Johnson, MD, 5 mg at 06/10/11 1424 influenza  inactive virus vaccine (FLUZONE/FLUARIX) injection 0.5 mL, 0.5 mL, Intramuscular, Tomorrow-1000, Hurman Horn, MD, 0.5 mL at 06/10/11 1035;  lamoTRIgine (LAMICTAL) tablet 100 mg, 100 mg, Oral, Daily, Clanford L Johnson, MD, 100 mg at 06/10/11 1015;  LORazepam (ATIVAN) tablet 2 mg, 2 mg, Oral, Q6H PRN, Clanford L Johnson, MD;  pantoprazole (PROTONIX) EC tablet 40 mg, 40 mg, Oral,  BID AC, Clanford Cyndie Mull, MD, 40 mg at 06/10/11 1424 pneumococcal 23 valent vaccine (PNU-IMMUNE) injection 0.5 mL, 0.5 mL, Intramuscular, Tomorrow-1000, Hurman Horn, MD, 0.5 mL at 06/10/11 1032;  QUEtiapine (SEROQUEL) tablet 400 mg, 400 mg, Oral, QHS, Clanford Cyndie Mull, MD;  senna-docusate (Senokot-S) tablet 1 tablet, 1 tablet, Oral, QHS PRN, Clanford Cyndie Mull, MD;  sodium chloride 0.9 % injection 3 mL, 3 mL, Intravenous, Q12H, Clanford L Johnson, MD, 3 mL at 06/10/11 1045 sodium chloride 0.9 % injection 3 mL, 3 mL, Intravenous, PRN, Clanford Cyndie Mull, MD, 3 mL at 06/10/11 1028;  Tamsulosin HCl (FLOMAX) capsule 0.4 mg,  0.4 mg, Oral, Daily, Clanford Cyndie Mull, MD;  DISCONTD: carbamazepine (CARBATROL) 12 hr capsule 300 mg, 300 mg, Oral, BID, Clanford Cyndie Mull, MD Allergies  Allergen Reactions  . Pineapple Anaphylaxis     Objective:     BP 106/68  Pulse 68  Temp(Src) 98 F (36.7 C) (Oral)  Resp 18  Ht 6\' 1"  (1.854 m)  Wt 53.8 kg (118 lb 9.7 oz)  BMI 15.65 kg/m2  SpO2 98%  General: He is in no acute distress  Heart regular rhythm no murmurs  Lungs clear  Abdomen bowel sounds normal soft nontender no splenomegaly  Laboratory No components found with this basename: d1      Assessment:     Anemia with microcytic indices this is most likely related to iron deficiency  History of esophageal ulcer  Family history of colon cancer in the patient's grandmother      Plan:     We will proceed with EGD and colonoscopy in a couple of days after he has been cleaned out adequately to ensure good examination     Component Value Date/Time   WBC 3.4* 06/10/2011 0640   HGB 10.1* 06/10/2011 0640   HCT 33.2* 06/10/2011 0640   PLT 345 06/10/2011 0640   ALT 29 12/11/2010 0331   AST 28 12/11/2010 0331   NA 141 06/10/2011 0640   K 3.7 06/10/2011 0640   CL 104 06/10/2011 0640   CREATININE 0.88 06/10/2011 0640   BUN 12 06/10/2011 0640   CO2 28 06/10/2011 0640   CALCIUM 9.1 06/10/2011 0640   PHOS 2.9 12/10/2010 2002   ALKPHOS 106 12/11/2010 0331

## 2011-06-10 NOTE — ED Notes (Signed)
Pt admission bed request noted to [facility] placed [facility], Secretary notified, awaiting bed placement

## 2011-06-11 ENCOUNTER — Encounter (HOSPITAL_COMMUNITY): Payer: Self-pay | Admitting: *Deleted

## 2011-06-11 ENCOUNTER — Inpatient Hospital Stay (HOSPITAL_COMMUNITY): Payer: Medicare Other

## 2011-06-11 ENCOUNTER — Inpatient Hospital Stay (HOSPITAL_COMMUNITY)
Admit: 2011-06-11 | Discharge: 2011-06-11 | Disposition: A | Discharge status: Home / Self Care | Payer: Medicare Other | Attending: Family Medicine | Admitting: Family Medicine

## 2011-06-11 LAB — BLOOD GAS, ARTERIAL
Acid-base deficit: 2 mmol/L (ref 0.0–2.0)
Drawn by: 232811
O2 Content: 2 L/min
O2 Saturation: 99.1 %
TCO2: 21 mmol/L (ref 0–100)
pCO2 arterial: 41.8 mmHg (ref 35.0–45.0)

## 2011-06-11 LAB — CBC
MCH: 22.5 pg — ABNORMAL LOW (ref 26.0–34.0)
MCHC: 30.1 g/dL (ref 30.0–36.0)
MCV: 74.9 fL — ABNORMAL LOW (ref 78.0–100.0)
Platelets: 341 10*3/uL (ref 150–400)
RDW: 18.3 % — ABNORMAL HIGH (ref 11.5–15.5)

## 2011-06-11 LAB — CARBAMAZEPINE LEVEL, TOTAL: Carbamazepine Lvl: 5.6 ug/mL (ref 4.0–12.0)

## 2011-06-11 LAB — BASIC METABOLIC PANEL
Calcium: 9.4 mg/dL (ref 8.4–10.5)
Creatinine, Ser: 0.96 mg/dL (ref 0.50–1.35)
GFR calc non Af Amer: >90 mL/min (ref >90)
Sodium: 141 mEq/L (ref 135–145)

## 2011-06-11 MED ORDER — SODIUM CHLORIDE 0.9 % IV SOLN
500.0000 mg | Freq: Two times a day (BID) | INTRAVENOUS | Status: DC
  Administered 2011-06-11 – 2011-06-13 (×5): 500 mg via INTRAVENOUS
  Filled 2011-06-11 (×9): qty 5

## 2011-06-11 MED ORDER — LORAZEPAM 2 MG/ML IJ SOLN: 2.0000 mg | Freq: Four times a day (QID) | INTRAMUSCULAR | Status: DC | PRN

## 2011-06-11 MED ORDER — CHLORHEXIDINE GLUCONATE 0.12 % MT SOLN
15.0000 mL | Freq: Two times a day (BID) | OROMUCOSAL | Status: DC
  Administered 2011-06-11 – 2011-06-16 (×11): 15 mL via OROMUCOSAL
  Filled 2011-06-11 (×13): qty 15

## 2011-06-11 MED ORDER — BENZTROPINE MESYLATE 1 MG/ML IJ SOLN
1.0000 mg | Freq: Two times a day (BID) | INTRAMUSCULAR | Status: DC
  Administered 2011-06-11 – 2011-06-13 (×4): 1 mg via INTRAMUSCULAR
  Administered 2011-06-13: 10:00:00 via INTRAMUSCULAR
  Filled 2011-06-11 (×8): qty 1

## 2011-06-11 MED ORDER — PANTOPRAZOLE SODIUM 40 MG IV SOLR
40.0000 mg | Freq: Two times a day (BID) | INTRAVENOUS | Status: DC
  Administered 2011-06-11 – 2011-06-16 (×12): 40 mg via INTRAVENOUS
  Filled 2011-06-11 (×14): qty 40

## 2011-06-11 MED ORDER — SODIUM CHLORIDE 0.9 % IV SOLN
INTRAVENOUS | Status: DC
  Administered 2011-06-11: 06:00:00 via INTRAVENOUS

## 2011-06-11 MED ORDER — SODIUM CHLORIDE 0.9 % IV SOLN
INTRAVENOUS | Status: DC
  Administered 2011-06-11: 09:00:00 via INTRAVENOUS

## 2011-06-11 MED ORDER — VALPROATE SODIUM 500 MG/5ML IV SOLN
20.0000 mg/kg | Freq: Once | INTRAVENOUS | Status: AC
  Administered 2011-06-11: 1046 mg via INTRAVENOUS
  Filled 2011-06-11: qty 10.46

## 2011-06-11 MED ORDER — LORAZEPAM 2 MG/ML IJ SOLN
INTRAMUSCULAR | Status: AC
  Administered 2011-06-11: 2 mg
  Filled 2011-06-11: qty 1

## 2011-06-11 MED ORDER — PEG 3350-KCL-NA BICARB-NACL 420 G PO SOLR
4000.0000 mL | Freq: Once | ORAL | Status: AC
  Administered 2011-06-11: 4000 mL via ORAL
  Filled 2011-06-11: qty 4000

## 2011-06-11 MED ORDER — VALPROATE SODIUM 500 MG/5ML IV SOLN
15.0000 mg/kg/d | Freq: Two times a day (BID) | INTRAVENOUS | Status: DC
  Administered 2011-06-11 – 2011-06-13 (×4): 392 mg via INTRAVENOUS
  Filled 2011-06-11 (×8): qty 3.92

## 2011-06-11 MED ORDER — BIOTENE DRY MOUTH MT LIQD
15.0000 mL | Freq: Two times a day (BID) | OROMUCOSAL | Status: DC
  Administered 2011-06-11 – 2011-06-16 (×10): 15 mL via OROMUCOSAL

## 2011-06-11 MED ORDER — POTASSIUM CHLORIDE 10 MEQ/100ML IV SOLN
10.0000 meq | INTRAVENOUS | Status: AC
  Administered 2011-06-11 (×2): 10 meq via INTRAVENOUS
  Filled 2011-06-11: qty 200

## 2011-06-11 NOTE — Progress Notes (Signed)
[facility] Progress Note  Subjective: Patient was tranferred to ICU yesterday because of a seizure. Doing fine today  Objective: Vital signs in last 24 hours: Temp:  [97.9 F (36.6 C)-100 F (37.8 C)] 97.9 F (36.6 C) (01/16 0800) Pulse Rate:  [67-151] 110  (01/16 0600) Resp:  [12-20] 12  (01/16 0600) BP: (104-121)/(64-75) 121/75 mmHg (01/16 0600) SpO2:  [92 %-98 %] 98 % (01/16 0600) Weight:  [52.3 kg (115 lb 4.8 oz)-53.8 kg (118 lb 9.7 oz)] 52.3 kg (115 lb 4.8 oz) (01/16 0559) Weight change: -7.436 kg (-16 lb 6.3 oz)   PE:No distress Heart regular Lungs clear Abdomen non tender   Lab Results: Results for orders placed during the hospital encounter of 06/09/11 (from the past 24 hour(s))  MRSA PCR SCREENING     Status: Normal   Collection Time   06/10/11 12:50 PM      Component Value Range   MRSA by PCR NEGATIVE  NEGATIVE   GLUCOSE, CAPILLARY     Status: Abnormal   Collection Time   06/10/11  9:30 PM      Component Value Range   Glucose-Capillary 126 (*) 70 - 99 (mg/dL)  CBC     Status: Abnormal   Collection Time   06/11/11  4:20 AM      Component Value Range   WBC 7.2  4.0 - 10.5 (K/uL)   RBC 4.66  4.22 - 5.81 (MIL/uL)   Hemoglobin 10.5 (*) 13.0 - 17.0 (g/dL)   HCT 78.4 (*) 69.6 - 52.0 (%)   MCV 74.9 (*) 78.0 - 100.0 (fL)   MCH 22.5 (*) 26.0 - 34.0 (pg)   MCHC 30.1  30.0 - 36.0 (g/dL)   RDW 29.5 (*) 28.4 - 15.5 (%)   Platelets 341  150 - 400 (K/uL)  BASIC METABOLIC PANEL     Status: Abnormal   Collection Time   06/11/11  4:20 AM      Component Value Range   Sodium 141  135 - 145 (mEq/L)   Potassium 3.4 (*) 3.5 - 5.1 (mEq/L)   Chloride 101  96 - 112 (mEq/L)   CO2 15 (*) 19 - 32 (mEq/L)   Glucose, Bld 115 (*) 70 - 99 (mg/dL)   BUN 10  6 - 23 (mg/dL)   Creatinine, Ser 1.32  0.50 - 1.35 (mg/dL)   Calcium 9.4  8.4 - 44.0 (mg/dL)   GFR calc non Af Amer >90  >90 (mL/min)   GFR calc Af Amer >90  >90 (mL/min)  BLOOD GAS, ARTERIAL     Status: Abnormal   Collection Time   06/11/11  4:45 AM      Component Value Range   O2 Content 2.0     Delivery systems NASAL CANNULA     pH, Arterial 7.356  7.350 - 7.450    pCO2 arterial 41.8  35.0 - 45.0 (mmHg)   pO2, Arterial 147.0 (*) 80.0 - 100.0 (mmHg)   Bicarbonate 22.5  20.0 - 24.0 (mEq/L)   TCO2 21.0  0 - 100 (mmol/L)   Acid-base deficit 2.0  0.0 - 2.0 (mmol/L)   O2 Saturation 99.1     Patient temperature 100.4     Collection site RIGHT RADIAL     Drawn by [id number]     Sample type ARTERIAL     Allens test (pass/fail) PASS  PASS   CARBAMAZEPINE LEVEL, TOTAL     Status: Normal   Collection Time   06/11/11  6:05 AM  Component Value Range   Carbamazepine Lvl 5.6  4.0 - 12.0 (ug/mL)    Studies/Results: @RISRSLT24 @    Assessment: Anemia  Plan: EGD and colonoscopy tomorrow.    Mary-Ann Pennella F 06/11/2011, 10:53 AM

## 2011-06-11 NOTE — Clinical Documentation Improvement (Signed)
MALNUTRITION DOCUMENTATION CLARIFICATION  THIS DOCUMENT IS NOT A PERMANENT PART OF THE MEDICAL RECORD  TO RESPOND TO THE THIS QUERY, FOLLOW THE INSTRUCTIONS BELOW:  1. If needed, update documentation for the patient's encounter via the notes activity.  2. Access this query again and click edit on the In [employer].  3. After updating, or not, click F2 to complete all highlighted (required) fields concerning your review. Select "additional documentation in the medical record" OR "no additional documentation provided".  4. Click Sign note button.  5. The deficiency will fall out of your In Basket *Please let us know if you are not able to complete this workflow by phone or e-mail (listed below).  Please update your documentation within the medical record to reflect your response to this query.                                                                                        06/11/11   Dear Dr.P Thedore Mins and /or Associates,  In a better effort to capture your patient's severity of illness, reflect appropriate length of stay and utilization of resources, a review of the patient medical record has revealed the following indicators.    Based on your clinical judgment, please clarify and document in a progress note and/or discharge summary the clinical condition associated with the following supporting information:  In responding to this query please exercise your independent judgment.  The fact that a query is asked, does not imply that any particular answer is desired or expected.  06/10/11 Nutrition assesment noted with recommendations, BMI. For accurate Dx specificity & severity please help clarify and /or validate nutritional assessment as related to probable, likely, suspected, or possible condition. Thank you   Possible Clinical Conditions?  _______Mild Malnutrition  _______Moderate Malnutrition _______Severe Malnutrition   _______Protein Calorie Malnutrition _______Severe  Protein Calorie Malnutrition _______Emaciation  _______Cachexia   _______Other Condition________________ _______Cannot clinically determine     Supporting Information: Risk Factors: see Nutrition Assessment 06/10/11  -Signs & Symptoms: Ht: 6\' 1"  (185.4 cm) Wt: 118 lb 9.7 oz (53.8 kg) Ideal Wt: 75.4 kg % Ideal Wt: 71% Usual Wt: Last known wt in July 2012, 57.3 kg % Usual Wt: 93.8 kg  Body mass index is 15.65 kg/(m^2).  -Diagnostics: see Nutrition Assessment 06/10/11  -Treatments: see Nutrition Assessment 06/10/11  -Nutrition Consult: see Nutrition Assessment 06/10/11  You may use possible, probable, or suspect with inpatient documentation. possible, probable, suspected diagnoses MUST be documented at the time of discharge  Reviewed: additional documentation in the medical record  Thank You,  Toribio Harbour, RN, BSN, CCDS Certified Clinical Documentation Specialist Pager: [phone]  [facility] [place]

## 2011-06-11 NOTE — Progress Notes (Signed)
UR completed 

## 2011-06-11 NOTE — Progress Notes (Signed)
Monitor clerk alerted RN that pt's HR up to 170's on heart monitor.  RN went in room to check on pt. Pt laying on his left side, would not open eyes immediately.  Not responding verbally, not actively moving arms or hands as was previously. Vital signs taken 104/64, 100.0 Axillary, 151 HR, 98% 2L. Pt with jerky and twitching movement, although jerky movement was previously seen prior to event and was reported to me upon assuming care of pt.  Rapid response nurse paged and up to see pt.  MD- Dr. Joneen Roach also paged and up to see pt.  Orders given for CT of head without contrast, ABG on 2L, NS @ 135ml/hr.  Tylenol 650mg  suppository given. Pt taken for CT of head then transferred to stepdown unit. [place]

## 2011-06-11 NOTE — Progress Notes (Signed)
[name] CSN:[id number],MRN:[id number] is a 46 y.o. male,  Outpatient Primary MD for the patient is [facility], MD  Chief Complaint  Patient presents with  . Anemia        Subjective:   [name] today has, does not answer questions   Objective:   Filed Vitals:   06/11/11 0410 06/11/11 0559 06/11/11 0600 06/11/11 0800  BP: 104/64 121/75 121/75   Pulse: 151 106 110   Temp: 100 F (37.8 C) 99.2 F (37.3 C)  97.9 F (36.6 C)  TempSrc: Axillary Axillary  Axillary  Resp: 20 12 12    Height:  6\' 1"  (1.854 m)    Weight:  52.3 kg (115 lb 4.8 oz)    SpO2:  97% 98%     Wt Readings from Last 3 Encounters:  06/11/11 52.3 kg (115 lb 4.8 oz)  06/07/09 60.328 kg (133 lb)  03/26/07 61.236 kg (135 lb)     Intake/Output Summary (Last 24 hours) at 06/11/11 0903 Last data filed at 06/11/11 0600  Gross per 24 hour  Intake    100 ml  Output    400 ml  Net   -300 ml    Exam Awake will not follow commands [place].AT,PERRAL Supple Neck,No JVD, No cervical lymphadenopathy appriciated.  Symmetrical Chest wall movement, Good air movement bilaterally, CTAB RRR,No Gallops,Rubs or new Murmurs, No Parasternal Heave +ve B.Sounds, Abd Soft, Non tender, No organomegaly appriciated, No rebound -guarding or rigidity. No Cyanosis, Clubbing or edema, No new Rash or bruise   Data Review  CBC  Lab 06/11/11 0420 06/10/11 0640 06/09/11 1640  WBC 7.2 3.4* 3.9*  HGB 10.5* 10.1* 7.1*  HCT 34.9* 33.2* 25.2*  PLT 341 345 389  MCV 74.9* 75.1* 72.4*  MCH 22.5* 22.9* 20.4*  MCHC 30.1 30.4 28.2*  RDW 18.3* 17.9* 17.5*  LYMPHSABS -- -- --  MONOABS -- -- --  EOSABS -- -- --  BASOSABS -- -- --  BANDABS -- -- --    Chemistries   Lab 06/11/11 0420 06/10/11 0640 06/09/11 1640  NA 141 141 139  K 3.4* 3.7 3.8  CL 101 104 104  CO2 15* 28 29  GLUCOSE 115* 79 86  BUN 10 12 17   CREATININE 0.96 0.88 0.89  CALCIUM 9.4 9.1 8.9  MG -- -- --  AST -- -- --  ALT -- -- --  ALKPHOS -- -- --  BILITOT  -- -- --   ------------------------------------------------------------------------------------------------------------------ estimated creatinine clearance is 73.4 ml/min (by C-G formula based on Cr of 0.96). ------------------------------------------------------------------------------------------------------------------ No results found for this basename: HGBA1C:2 in the last 72 hours ------------------------------------------------------------------------------------------------------------------ No results found for this basename: CHOL:2,HDL:2,LDLCALC:2,TRIG:2,CHOLHDL:2,LDLDIRECT:2 in the last 72 hours ------------------------------------------------------------------------------------------------------------------  [facility] 06/10/11 0935  TSH 0.608  T4TOTAL --  T3FREE --  THYROIDAB --   ------------------------------------------------------------------------------------------------------------------ No results found for this basename: VITAMINB12:2,FOLATE:2,FERRITIN:2,TIBC:2,IRON:2,RETICCTPCT:2 in the last 72 hours  Coagulation profile No results found for this basename: INR:5,PROTIME:5 in the last 168 hours  No results found for this basename: DDIMER:2 in the last 72 hours  Cardiac Enzymes No results found for this basename: CK:3,CKMB:3,TROPONINI:3,MYOGLOBIN:3 in the last 168 hours ------------------------------------------------------------------------------------------------------------------ No components found with this basename: POCBNP:3  Micro Results Recent Results (from the past 240 hour(s))  MRSA PCR SCREENING     Status: Normal   Collection Time   06/10/11 12:50 PM      Component Value Range Status Comment   MRSA by PCR NEGATIVE  NEGATIVE  Final     Radiology Reports Ct Head  Wo Contrast  06/11/2011  *RADIOLOGY REPORT*  Clinical Data: Altered mental status; no longer verbally responsive.  Uncontrolled spasms.  CT HEAD WITHOUT CONTRAST  Technique:  Contiguous  axial images were obtained from the base of the skull through the vertex without contrast.  Comparison: CT of the head performed 12/10/2010  Findings: There is no evidence of acute infarction, mass lesion, or intra- or extra-axial hemorrhage on CT.  Linear foci of increased attenuation along the high left frontal lobe are thought to reflect beam hardening artifact.  Additional beam hardening artifact is noted along the inferior frontal lobes bilaterally.  Marked cerebellar atrophy is noted.  Chronic encephalomalacia is noted at the right temporal lobe, and to a lesser extent at the left temporal lobe, reflecting remote infarct.  Chronic encephalomalacia is also noted bilaterally near the vertex, more prominent on the left.  Scattered subcortical white matter change likely reflects small vessel ischemic microangiopathy.  Prominence of the ventricles suggests mild cortical volume loss. The basal ganglia are unremarkable in appearance.  No mass effect or midline shift is seen.  There is no evidence of fracture; visualized osseous structures are unremarkable in appearance.  The orbits are within normal limits. The paranasal sinuses and mastoid air cells are well-aerated.  No significant soft tissue abnormalities are seen.  IMPRESSION:  1.  No definite acute intracranial pathology seen on CT. 2.  Marked cerebellar atrophy noted. 3.  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  Scattered small vessel ischemic microangiopathy.  Original Report Authenticated By: Tonia Ghent, M.D.    Scheduled Meds:   . benztropine  1 mg Oral BID  . carbamazepine  300 mg Oral BID  . enoxaparin  40 mg Subcutaneous Q24H  . feeding supplement  1 Container Oral TID WC  . FLUoxetine  40 mg Oral Daily  . haloperidol  5 mg Oral BID  . influenza  inactive virus vaccine  0.5 mL Intramuscular Tomorrow-1000  . lamoTRIgine  100 mg Oral Daily  . LORazepam      . pantoprazole  40 mg Oral BID AC    . pneumococcal 23 valent vaccine  0.5 mL Intramuscular Tomorrow-1000  . potassium chloride  10 mEq Intravenous Q1 Hr x 2  . QUEtiapine  400 mg Oral QHS  . sodium chloride  3 mL Intravenous Q12H  . Tamsulosin HCl  0.4 mg Oral Daily  . DISCONTD: carbamazepine  300 mg Oral BID   Continuous Infusions:   . sodium chloride    . DISCONTD: sodium chloride 100 mL/hr at 06/11/11 0555   PRN Meds:.sodium chloride, acetaminophen, acetaminophen, food thickener, LORazepam, LORazepam, senna-docusate, sodium chloride  Assessment & Plan   1. Iron deficiency anemia due to chronic blood loss/ history of distal esophageal ulcer/ family history of colon cancer:  - Status post blood transfusion, H&H stable, no active GI bleeding, continue PPI , Dr. Rochele Raring [facility] gastroenterology) following, plans for endoscopy (upper and lower) due to history of esophageal ulcer and family history of colon cancer. NPO for now till cleared by  Speech.   2. H/O SEIZURE, GRAND MAL, POST BRAIN INJURY, history of severe closed head injury: had seizure last night, drowsy so unable to take PO Cogentin, Tegretol, Lamictal - D/W Dr Lyman Speller, Iv Keppra and Depakote for now, PRN Ativan.   3. DYSPHAGIA DUE TO BRAIN INJURY, EPISODIC SILENT ASPIRATION: speech to see now as post ictial, NPO till then.   4. GERD: Continue PPI switch to IV   5.  BENIGN PROSTATIC HYPERTROPHY, WITH URINARY OBSTRUCTION: Continue Flomax once taking PO.   6. Low K - replaced IV.  D/W Mother bedside, Med Rx OK for [place], DNR otherwise.    DVT Prophylaxis: Bilateral SCDs stop Lovenox.   See all Orders from today for further details     Leroy Sea M.D on 06/11/2011 at 9:03 AM  [facility] Office  [phone]

## 2011-06-11 NOTE — Progress Notes (Signed)
*  PRELIMINARY RESULTS* EEG has been performed.  Markus Jarvis 06/11/2011, 12:29 PM

## 2011-06-11 NOTE — Progress Notes (Signed)
Rapid Response Event Note  Overview:  Called to room 1445 at 0407 per RN [facility]. Per RN pt has hx of brain injury , seizures and mental retardation. At 0340 Rn found pt heart rate 170s SR per tele box and pt has had a change in LOC.   Initial Focused Assessment: Upon my arrival at 0407  pt found resting in bed. Occasional twitches, not following commands, no eye contact. Pupils size 2 equal reactive sluggish bilat. Pt responsive to pain in lower extreme ties only. Per ICU monitor pt heart rate 130s ST. BP stable. Breath sounds clear, but loud airy breathing, resp rate 15-20. Per [facility] pt neuro baseline, pt makes eye contact follows simple commands and speaks some words, with occasional twitching. Pt on some anti seizure meds, received last at 2200.  Interventions: Pt placed on monitor , and continuous pulse oximeter yielding 96-98 on 2 LNC. Neuro assessment completed. Pt axillary temp 101 F rectal temp 100.4 pt feels warm, tylenol 650 suppository given.  NP Westley Hummer paged prior to my arrival at 0400, no call back received. 2nd page sent at 0425, Page sent to MD D. Crosley call back received 0445. MD updated on pt status, orders received to CT, IV fluids, And transfer to SD. Dr Joneen Roach and Westley Hummer NP  at bedside 0500, examined pt.Pt twitching increased during my stay in pt room, this finding shared with MD. Pt taken to CT at 0515, and taken to room 1234. Report called to Lauren RN per [facility] RN 4 W  Event Summary:   at  0415 temp 101 AX, 132 ST bpm, 97% 2l [place] 16 RR, bp 103/67       0430                           133 ST. 98% 2 LNC, 17 RR, bp 114/72       0445                              127     96                18              105/71       0500                               125     95             16   at          [address], [employer]

## 2011-06-11 NOTE — Progress Notes (Signed)
MEDICATION RELATED CONSULT NOTE - INITIAL   Pharmacy Consult for IV Keppra/Depakote Indication: Seizure  Allergies  Allergen Reactions  . Pineapple Anaphylaxis    Patient Measurements: Height: 6\' 1"  (185.4 cm) Weight: 115 lb 4.8 oz (52.3 kg) IBW/kg (Calculated) : 79.9   Labs:  Basename 06/11/11 0420 06/10/11 0640 06/09/11 1640  WBC 7.2 3.4* 3.9*  HGB 10.5* 10.1* 7.1*  HCT 34.9* 33.2* 25.2*  PLT 341 345 389  APTT -- -- --  CREATININE 0.96 0.88 0.89  LABCREA -- -- --  CREATININE 0.96 0.88 0.89  CREAT24HRUR -- -- --  MG -- -- --  PHOS -- -- --  ALBUMIN -- -- --  PROT -- -- --  ALBUMIN -- -- --  AST -- -- --  ALT -- -- --  ALKPHOS -- -- --  BILITOT -- -- --  BILIDIR -- -- --  IBILI -- -- --   Estimated Creatinine Clearance: 73.4 ml/min (by C-G formula based on Cr of 0.96).   Microbiology: Recent Results (from the past 720 hour(s))  MRSA PCR SCREENING     Status: Normal   Collection Time   06/10/11 12:50 PM      Component Value Range Status Comment   MRSA by PCR NEGATIVE  NEGATIVE  Final     Medical History: Past Medical History  Diagnosis Date  . 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 Depression   . 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 Closed head injury   . Seizures   . Peptic ulcer disease   . Esophageal ulcer   . Malnutrition   . Constipation   . BPH (benign prostatic hyperplasia)   . Smoker     Medications:  Scheduled:    . benztropine  1 mg Oral BID  . carbamazepine  300 mg Oral BID  . feeding supplement  1 Container Oral TID WC  . FLUoxetine  40 mg Oral Daily  . haloperidol  5 mg Oral BID  . influenza  inactive virus vaccine  0.5 mL Intramuscular Tomorrow-1000  . lamoTRIgine  100 mg Oral Daily  . LORazepam      . pantoprazole (PROTONIX) IV  40 mg Intravenous Q12H  . pneumococcal 23 valent vaccine  0.5 mL Intramuscular Tomorrow-1000  . potassium chloride  10 mEq Intravenous Q1 Hr x 2  .  QUEtiapine  400 mg Oral QHS  . sodium chloride  3 mL Intravenous Q12H  . Tamsulosin HCl  0.4 mg Oral Daily  . DISCONTD: enoxaparin  40 mg Subcutaneous Q24H  . DISCONTD: pantoprazole  40 mg Oral BID AC   Infusions:    . sodium chloride    . 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  Patient suffered a seizure last night and neuro is recommending IV Keppra and Depakote for now with PRN Ativan.  Metabolism of Valproic acid may be increased with Tegretol still in the patient's system so will dose at 15mg /kg/day.  Goal of Therapy:  Seizure control  Plan:  Valproate 1046mg  (20mg /kg) IV loading dose x1 then 392mg  IV q12h (for 15mg /kg/day). Keppra 500mg  IV q12h.  Clance Boll 06/11/2011,9:21 AM

## 2011-06-11 NOTE — Progress Notes (Signed)
Speech Language/Pathology Clinical/Bedside Swallow Evaluation Patient Details  Name: [name] MRN: [id number] DOB: 13-Feb-1966 Today's Date: 06/11/2011 [id number]  Past Medical History:  Past Medical History  Diagnosis Date  . 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 Depression   . 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 Closed head injury   . Seizures   . Peptic ulcer disease   . Esophageal ulcer   . Malnutrition   . Constipation   . BPH (benign prostatic hyperplasia)   . Smoker    Past Surgical History:  Past Surgical History  Procedure Date  . Gastrectomy     Assessment/Recommendations/Treatment Plan Suspected Esophageal Findings Suspected Esophageal Findings: Other (comment) (known h/o reflux to thoracic region 9/25)  SLP Assessment Clinical Impression Statement: Pt with symptoms of moderate oropharyngeal dysphagia that likely is chronic since closed head injury.  Per SLP discussion with dietician, pt was on a puree/nectar diet at group home last summer.  SLP attempted to call group home number [phone] with mother's permission, no answer.  Aspiration risk will be chronic secondary to pt's chronic dysphagia, however CXR is negative for acute process and suspect pt has tolerated po well  Spoke to caregiver [name] who reports pt consumes regular foods and nectar thick liquids at the group home.  He is supervised when eating as the pt is impulsive and takes large amounts.  SLP to follow up after colonoscopy for tolerance and dysphagia management.  Thanks for the order.    Risk for Aspiration: Moderate  Swallow Recommendations Liquid Consistency: Nectar Liquid Administration via: Cup Medication Administration: Whole meds with puree Supervision: Full supervision/cueing for compensatory strategies;Patient able to self feed Compensations: Slow rate;Small sips/bites;Multiple dry swallows after each bite/sip (reflexive dry  swallows) Postural Changes and/or Swallow Maneuvers: Out of bed for meals;Seated upright 90 degrees Oral Care Recommendations: Oral care BID Other Recommendations: Order thickener from pharmacy Follow up Recommendations: Other (comment) (TBD)  Treatment Plan Speech Therapy Frequency: min 1 x/week Treatment Duration: 1 week Interventions: Aspiration precaution training;Compensatory techniques;Patient/family education  Prognosis Prognosis for Safe Diet Advancement: Fair Barriers to Reach Goals: Cognitive deficits;Severity of dysphagia;Time post onset  Individuals Consulted Consulted and Agree with Results and Recommendations: Patient;Family member/caregiver Family Member Consulted: Mother, [name] caregiver  Swallowing Goals  SLP Swallowing Goals Patient will utilize recommended strategies during swallow to increase swallowing safety with: Total assistance  Swallow Study Prior Functional Status   Pt group home resident, full assistance.   General  Type of Study: Bedside swallow evaluation Diet Prior to this Study: NPO Temperature Spikes Noted: No Respiratory Status: Room air Behavior/Cognition: Alert;Pleasant mood Oral Cavity - Dentition: Adequate natural dentition Vision: Functional for self-feeding Patient Positioning: Postural control adequate for testing Baseline Vocal Quality: Low vocal intensity Volitional Cough: Weak Volitional Swallow: Unable to elicit Ice chips: Not tested  Oral Motor/Sensory Function  Overall Oral Motor/Sensory Function: Impaired at baseline (weakness, mom reports voice weak x2-3 yrs (not worse today))  Consistency Results  Ice Chips Ice chips: Not tested  Thin Liquid Thin Liquid: Impaired Presentation: Spoon Oral Phase Impairments: Impaired anterior to posterior transit;Reduced lingual movement/coordination Pharyngeal  Phase Impairments: Cough - Immediate;Delayed Swallow Other Comments: weak nonprotective cough, overt coughing  Nectar Thick  Liquid Nectar Thick Liquid: Impaired Presentation: Cup;Self Fed Oral Phase Impairments: Impaired anterior to posterior transit;Reduced lingual movement/coordination Pharyngeal Phase Impairments: Multiple swallows;Delayed Swallow  Honey Thick Liquid Honey Thick Liquid: Not tested  Puree Puree: Impaired Presentation: Spoon Oral Phase  Impairments: Impaired anterior to posterior transit;Reduced lingual movement/coordination Pharyngeal Phase Impairments: Delayed Swallow;Other (comments) (multiple swallows) Other Comments: jello given only due to pt having a colonoscopy next date  Solid Solid: Not tested Other Comments: secondary to pt having colonscopy next date  Donavan Burnet, [facility] SLP [phone]

## 2011-06-12 ENCOUNTER — Encounter (HOSPITAL_COMMUNITY): Payer: Self-pay | Admitting: Anesthesiology

## 2011-06-12 LAB — CBC
MCV: 75.1 fL — ABNORMAL LOW (ref 78.0–100.0)
Platelets: 323 10*3/uL (ref 150–400)
RBC: 4.25 MIL/uL (ref 4.22–5.81)
WBC: 8 10*3/uL (ref 4.0–10.5)

## 2011-06-12 LAB — BASIC METABOLIC PANEL
CO2: 25 mEq/L (ref 19–32)
Chloride: 109 mEq/L (ref 96–112)
Creatinine, Ser: 0.64 mg/dL (ref 0.50–1.35)
Potassium: 4.3 mEq/L (ref 3.5–5.1)

## 2011-06-12 MED ORDER — PEG 3350-KCL-NA BICARB-NACL 420 G PO SOLR: 4000.0000 mL | Freq: Once | ORAL | Status: DC

## 2011-06-12 MED ORDER — OLOPATADINE HCL 0.2 % OP SOLN: 1.0000 [drp] | Freq: Two times a day (BID) | OPHTHALMIC | Status: DC

## 2011-06-12 MED ORDER — LAMOTRIGINE 100 MG PO TABS
100.0000 mg | ORAL_TABLET | Freq: Every day | ORAL | Status: DC
  Administered 2011-06-12 – 2011-06-13 (×2): 100 mg via ORAL
  Filled 2011-06-12 (×3): qty 1

## 2011-06-12 MED ORDER — ONDANSETRON HCL 4 MG/2ML IJ SOLN
4.0000 mg | Freq: Four times a day (QID) | INTRAMUSCULAR | Status: DC | PRN
  Administered 2011-06-12: 4 mg via INTRAVENOUS

## 2011-06-12 MED ORDER — ONDANSETRON HCL 4 MG/2ML IJ SOLN
INTRAMUSCULAR | Status: AC
  Administered 2011-06-12: 4 mg via INTRAVENOUS
  Filled 2011-06-12: qty 2

## 2011-06-12 MED ORDER — MAGNESIUM CITRATE PO SOLN
1.0000 | Freq: Once | ORAL | Status: AC
  Administered 2011-06-12: 1 via ORAL
  Filled 2011-06-12: qty 296

## 2011-06-12 MED ORDER — BISACODYL 10 MG RE SUPP
10.0000 mg | Freq: Once | RECTAL | Status: DC
  Filled 2011-06-12: qty 1

## 2011-06-12 MED ORDER — SODIUM CHLORIDE 0.9 % IV SOLN
INTRAVENOUS | Status: AC
  Administered 2011-06-12: 11:00:00 via INTRAVENOUS

## 2011-06-12 MED ORDER — OLOPATADINE HCL 0.2 % OP SOLN
1.0000 [drp] | Freq: Every day | OPHTHALMIC | Status: DC
  Administered 2011-06-15: 1 [drp] via OPHTHALMIC

## 2011-06-12 MED ORDER — CLONAZEPAM 0.5 MG PO TABS
0.5000 mg | ORAL_TABLET | Freq: Two times a day (BID) | ORAL | Status: DC
  Administered 2011-06-12 – 2011-06-15 (×7): 0.5 mg via ORAL
  Filled 2011-06-12 (×7): qty 1

## 2011-06-12 NOTE — Progress Notes (Signed)
The patient was seen today and it was reported that he drank 1 gallon of the prep for colonoscopy but had no bowel movements. PE: Abdomen is soft and non tender Imp: Anemia Plan: Reschedule EGD and Colonoscopy for tomorrow after additional prep

## 2011-06-12 NOTE — Procedures (Signed)
EEG ID:  [id number].  HISTORY:  A 46 year old man with mental retardation referred to rule out seizures.  MEDICATIONS:  Keppra.  CONDITION OF RECORDING:  This 16-lead EEG was recorded with the patient in awake and drowsy states.  Background rhythm: background patterns in wakefulness were well organized with a well sustained posterior dominant rhythm of 7.5 Hz, symmetrical and reactive to eye opening and closing. Drowsiness was associated with mild attenuation of voltage and slowing of frequencies.  Abnormal potentials: no epileptiform activity or focal slowing was noted.  ACTIVATION PROCEDURES:  Hyperventilation was not performed.  Photic stimulation did not activate tracing.  EKG:  Single-channel EKG monitoring was unremarkable.  IMPRESSION:  This is an abnormal awake and drowsy EEG due to presence of mild diffuse background slowing.  This finding may be suggestive of mild diffuse cerebral dysfunction.  It may be due to toxic metabolic encephalopathy, neurodegenerative disorder and/or bi-hemispheric structural abnormality.  Clinical correlation is suggested.          ______________________________ Carmell Austria, MD    [id number] D:  06/11/2011 18:44:01  T:  06/12/2011 08:20:47  Job #:  [id number]

## 2011-06-12 NOTE — Anesthesia Preprocedure Evaluation (Deleted)
Anesthesia Evaluation  Patient identified by MRN, date of birth, ID band Patient awake  General Assessment Comment:Hx from Chart  Reviewed: Allergy & Precautions, H&P , NPO status , Patient's Chart, lab work & pertinent test results, reviewed documented beta blocker date and time   Airway Mallampati: II TM Distance: >3 FB   Mouth opening: Limited Mouth Opening  Dental   Pulmonary Current Smoker,    + decreased breath sounds      Cardiovascular neg cardio ROS Regular Normal    Neuro/Psych Seizures -,  Depression Multiple neurologic issues Seizure hx Closed head injury MR Multiple anti convulsants Significant cerebral atrophy on CT Negative Psych ROS   GI/Hepatic negative GI ROS, Neg liver ROS, PUD, GERD-  ,R/o upper vs lower GI blood loss   Endo/Other  Negative Endocrine ROS  Renal/GU negative Renal ROS   BPH    Musculoskeletal negative musculoskeletal ROS (+)   Abdominal   Peds negative pediatric ROS (+)  Hematology Anemia, Hgb 9.6   Anesthesia Other Findings Teeth appear intact  Reproductive/Obstetrics negative OB ROS                          Anesthesia Physical Anesthesia Plan  ASA: III  Anesthesia Plan: MAC   Post-op Pain Management:    Induction: Intravenous  Airway Management Planned: Mask  Additional Equipment:   Intra-op Plan:   Post-operative Plan:   Informed Consent: I have reviewed the patients History and Physical, chart, labs and discussed the procedure including the risks, benefits and alternatives for the proposed anesthesia with the patient or authorized representative who has indicated his/her understanding and acceptance.     Plan Discussed with: CRNA and Surgeon  Anesthesia Plan Comments:         Anesthesia Quick Evaluation

## 2011-06-12 NOTE — Progress Notes (Signed)
Speech Language/Pathology Speech Language Pathology Swallow Treatment Patient Details Name: [name] MRN: [id number] DOB: 1965/09/28 Today's Date: 06/12/2011  SLP Assessment/Plan/Recommendation Assessment Clinical Impression Statement: Pt continues NPO per MD orders, for colonoscopy which has been deferred until tomorrow. Observed nsg give meds whole in puree and gave pt nectar this juice as nsg stated this would not interfere with plan for colonoscopy at this time. Pt tolerated all consistencies with out overt difficulty or s/s of aspiration. His voice remianed clear after PO and swallow is timely. ST to f/u after colonoscopy for diet tolerance  Swallowing Goals  SLP Swallowing Goals Patient will consume recommended diet without observed clinical signs of aspiration with: Moderate assistance Swallow Study Goal #1 - Progress: Progressing toward goal Patient will utilize recommended strategies during swallow to increase swallowing safety with: Total assistance Swallow Study Goal #2 - Progress: Progressing toward goal  General Patient observed directly with PO's: Yes Type of PO's observed: Dysphagia 1 (puree);Nectar-thick liquids Feeding: Needs assist Liquids provided via: Teaspoon Temperature Spikes Noted: No Respiratory Status: Room air Behavior/Cognition: Alert;Confused;Distractible;Pleasant mood Oral Cavity - Dentition: Adequate natural dentition Patient Positioning: Upright in bed  Oral Cavity - Oral Hygiene     Assessment of Diet Tolerance Clinical Impression Statement: Pt continues NPO per MD orders, for colonoscopy which has been deferred until tomorrow. Observed nsg give meds whole in puree and gave pt nectar this juice as nsg stated this would not interfere with plan for colonoscopy at this time. Pt tolerated all consistencies with out overt difficulty or s/s of aspiration. His voice remianed clear after PO and swallow is timely. ST to f/u after colonoscopy for diet  tolerance Supervision/Assistance required: Yes Type of cueing: Verbal Amount of cueing: Minimal Recommendations: Continue current diet (pending GI clearance for PO after colonoscopy)   Tesla Keeler, Radene Journey 06/12/2011, 11:43 AM

## 2011-06-12 NOTE — Progress Notes (Signed)
Pt vomitted moderate amt of  coffee ground like liquid. RN reported from ICU that pt vomitted after taking Mg Citrate. Dr Evette Cristal notified & aware. See orders. Will cont to monitor and reassess in am.

## 2011-06-12 NOTE — Progress Notes (Signed)
[name] ZOX:[id number] is a 46 y.o. male,  Outpatient Primary MD for the patient is [name], MD, MD  Chief Complaint  Patient presents with  . Anemia        Subjective:   [name] today has, does not answer questions   Objective:   Filed Vitals:   06/12/11 0200 06/12/11 0400 06/12/11 0600 06/12/11 0800  BP: 102/78 103/70 102/71   Pulse: 83 77 72   Temp:  97.6 F (36.4 C)  98 F (36.7 C)  TempSrc:  Oral  Oral  Resp: 16 12 12    Height:      Weight:      SpO2: 95% 95% 97%     Wt Readings from Last 3 Encounters:  06/12/11 49.9 kg (110 lb 0.2 oz)  06/12/11 49.9 kg (110 lb 0.2 oz)  06/07/09 60.328 kg (133 lb)     Intake/Output Summary (Last 24 hours) at 06/12/11 0843 Last data filed at 06/12/11 0500  Gross per 24 hour  Intake   6290 ml  Output   1525 ml  Net   4765 ml    Exam Awake will not follow commands [place].AT,PERRAL Supple Neck,No JVD, No cervical lymphadenopathy appriciated.  Symmetrical Chest wall movement, Good air movement bilaterally, CTAB RRR,No Gallops,Rubs or new Murmurs, No Parasternal Heave +ve B.Sounds, Abd Soft, Non tender, No organomegaly appriciated, No rebound -guarding or rigidity. No Cyanosis, Clubbing or edema, No new Rash or bruise   Data Review  CBC  Lab 06/12/11 0300 06/11/11 0420 06/10/11 0640 06/09/11 1640  WBC 8.0 7.2 3.4* 3.9*  HGB 9.6* 10.5* 10.1* 7.1*  HCT 31.9* 34.9* 33.2* 25.2*  PLT 323 341 345 389  MCV 75.1* 74.9* 75.1* 72.4*  MCH 22.6* 22.5* 22.9* 20.4*  MCHC 30.1 30.1 30.4 28.2*  RDW 18.6* 18.3* 17.9* 17.5*  LYMPHSABS -- -- -- --  MONOABS -- -- -- --  EOSABS -- -- -- --  BASOSABS -- -- -- --  BANDABS -- -- -- --    Chemistries   Lab 06/12/11 0300 06/11/11 0420 06/10/11 0640 06/09/11 1640  NA 142 141 141 139  K 4.3 3.4* 3.7 3.8  CL 109 101 104 104  CO2 25 15* 28 29  GLUCOSE 76 115* 79 86  BUN 4* 10 12 17   CREATININE 0.64 0.96 0.88 0.89  CALCIUM 8.8 9.4 9.1 8.9  MG -- -- -- --   AST -- -- -- --  ALT -- -- -- --  ALKPHOS -- -- -- --  BILITOT -- -- -- --   ------------------------------------------------------------------------------------------------------------------ estimated creatinine clearance is 84 ml/min (by C-G formula based on Cr of 0.64). ------------------------------------------------------------------------------------------------------------------ No results found for this basename: HGBA1C:2 in the last 72 hours ------------------------------------------------------------------------------------------------------------------ No results found for this basename: CHOL:2,HDL:2,LDLCALC:2,TRIG:2,CHOLHDL:2,LDLDIRECT:2 in the last 72 hours ------------------------------------------------------------------------------------------------------------------  [facility] 06/10/11 0935  TSH 0.608  T4TOTAL --  T3FREE --  THYROIDAB --   ------------------------------------------------------------------------------------------------------------------ No results found for this basename: VITAMINB12:2,FOLATE:2,FERRITIN:2,TIBC:2,IRON:2,RETICCTPCT:2 in the last 72 hours  Coagulation profile No results found for this basename: INR:5,PROTIME:5 in the last 168 hours  No results found for this basename: DDIMER:2 in the last 72 hours  Cardiac Enzymes No results found for this basename: CK:3,CKMB:3,TROPONINI:3,MYOGLOBIN:3 in the last 168 hours ------------------------------------------------------------------------------------------------------------------ No components found with this basename: POCBNP:3  Micro Results Recent Results (from the past 240 hour(s))  MRSA PCR SCREENING     Status: Normal   Collection Time   06/10/11 12:50 PM      Component Value  Range Status Comment   MRSA by PCR NEGATIVE  NEGATIVE  Final     Radiology Reports Ct Head Wo Contrast  06/11/2011  *RADIOLOGY REPORT*  Clinical Data: Altered mental status; no longer verbally responsive.   Uncontrolled spasms.  CT HEAD WITHOUT CONTRAST  Technique:  Contiguous axial images were obtained from the base of the skull through the vertex without contrast.  Comparison: CT of the head performed 12/10/2010  Findings: There is no evidence of acute infarction, mass lesion, or intra- or extra-axial hemorrhage on CT.  Linear foci of increased attenuation along the high left frontal lobe are thought to reflect beam hardening artifact.  Additional beam hardening artifact is noted along the inferior frontal lobes bilaterally.  Marked cerebellar atrophy is noted.  Chronic encephalomalacia is noted at the right temporal lobe, and to a lesser extent at the left temporal lobe, reflecting remote infarct.  Chronic encephalomalacia is also noted bilaterally near the vertex, more prominent on the left.  Scattered subcortical white matter change likely reflects small vessel ischemic microangiopathy.  Prominence of the ventricles suggests mild cortical volume loss. The basal ganglia are unremarkable in appearance.  No mass effect or midline shift is seen.  There is no evidence of fracture; visualized osseous structures are unremarkable in appearance.  The orbits are within normal limits. The paranasal sinuses and mastoid air cells are well-aerated.  No significant soft tissue abnormalities are seen.  IMPRESSION:  1.  No definite acute intracranial pathology seen on CT. 2.  Marked cerebellar atrophy noted. 3.  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  Scattered small vessel ischemic microangiopathy.  Original Report Authenticated By: Tonia Ghent, M.D.    EEG IMPRESSION: This is an abnormal awake and drowsy EEG due to presence of mild diffuse background slowing. The finding may be suggestive of mild  diffuse cerebral dysfunction. It may be due to toxic metabolic encephalopathy, neurodegenerative disorder and/or bihemispheric structural abnormality. Clinical correlation is  suggested.    Scheduled Meds:    . antiseptic oral rinse  15 mL Mouth Rinse q12n4p  . benztropine mesylate  1 mg Intramuscular BID  . chlorhexidine  15 mL Mouth Rinse BID  . clonazePAM  0.5 mg Oral BID  . feeding supplement  1 Container Oral TID WC  . FLUoxetine  40 mg Oral Daily  . haloperidol  5 mg Oral BID  . lamoTRIgine  100 mg Oral Daily  . levetiracetam  500 mg Intravenous Q12H  . Olopatadine HCl  1 drop Ophthalmic BID  . pantoprazole (PROTONIX) IV  40 mg Intravenous Q12H  . polyethylene glycol-electrolytes  4,000 mL Oral Once  . potassium chloride  10 mEq Intravenous Q1 Hr x 2  . QUEtiapine  400 mg Oral QHS  . sodium chloride  3 mL Intravenous Q12H  . Tamsulosin HCl  0.4 mg Oral Daily  . valproate sodium  20 mg/kg Intravenous Once  . valproate sodium  15 mg/kg/day Intravenous Q12H  . DISCONTD: benztropine  1 mg Oral BID  . DISCONTD: carbamazepine  300 mg Oral BID  . DISCONTD: enoxaparin  40 mg Subcutaneous Q24H  . DISCONTD: lamoTRIgine  100 mg Oral Daily  . DISCONTD: pantoprazole  40 mg Oral BID AC   Continuous Infusions:    . sodium chloride    . DISCONTD: sodium chloride 100 mL/hr at 06/11/11 0555  . DISCONTD: sodium chloride 75 mL/hr at 06/11/11 0900   PRN Meds:.sodium chloride, acetaminophen, acetaminophen, food thickener, LORazepam, LORazepam, senna-docusate,  sodium chloride  Assessment & Plan   1. Iron deficiency anemia due to chronic blood loss/ history of distal esophageal ulcer/ family history of colon cancer: Status post blood transfusion 06-10-11, H&H stable, no active GI bleeding, continue IV PPI , Dr. Rochele [facility] gastroenterology) following, plans for endoscopy (upper and lower) due to history of esophageal ulcer and family history of colon cancer.    2. H/O SEIZURE, GRAND MAL, POST BRAIN INJURY, history of severe closed head injury: had seizure 06-10-11, was drowsy post event so unable to take PO Tegretol, Lamictal - D/W Dr Lyman Speller, Iv Keppra and  Depakote for now, PRN Ativan. PO resume post GI procedures, will try to taper off IV seizure Meds in 24-48 hrs.   3. DYSPHAGIA DUE TO BRAIN INJURY, EPISODIC SILENT ASPIRATION: diet with Thickned liquids with assistance and Asp precautions, cleared by speech.   4. GERD: Continue PPI switched to IV.   5. BENIGN PROSTATIC HYPERTROPHY, WITH URINARY OBSTRUCTION: Continue Flomax once taking PO.   6. Low K - replaced & stable   D/W Mother bedside, Med Rx OK for [place], DNR otherwise.    DVT Prophylaxis: Bilateral SCDs stop Lovenox.   See all Orders from today for further details     Leroy Sea M.D on 06/12/2011 at 8:43 AM  [facility] Office  [phone]

## 2011-06-12 NOTE — Progress Notes (Signed)
Pt had huge loose BM in bed. Dulcolax supp not needed.

## 2011-06-13 ENCOUNTER — Encounter (HOSPITAL_COMMUNITY)
Admission: EM | Disposition: A | Discharge status: Home / Self Care | Payer: Self-pay | Source: Home / Self Care | Attending: Internal Medicine

## 2011-06-13 LAB — BASIC METABOLIC PANEL
Calcium: 8.7 mg/dL (ref 8.4–10.5)
GFR calc Af Amer: >90 mL/min (ref >90)
GFR calc non Af Amer: >90 mL/min (ref >90)
Potassium: 4.3 mEq/L (ref 3.5–5.1)
Sodium: 138 mEq/L (ref 135–145)

## 2011-06-13 LAB — CBC
MCH: 22.3 pg — ABNORMAL LOW (ref 26.0–34.0)
MCHC: 29.5 g/dL — ABNORMAL LOW (ref 30.0–36.0)
RDW: 19.2 % — ABNORMAL HIGH (ref 11.5–15.5)

## 2011-06-13 SURGERY — ESOPHAGOGASTRODUODENOSCOPY (EGD) WITH PROPOFOL
Anesthesia: Monitor Anesthesia Care

## 2011-06-13 SURGERY — EGD (ESOPHAGOGASTRODUODENOSCOPY)
Anesthesia: Choice

## 2011-06-13 MED ORDER — PROMETHAZINE HCL 25 MG/ML IJ SOLN: 6.2500 mg | INTRAMUSCULAR | Status: DC | PRN

## 2011-06-13 MED ORDER — LACTATED RINGERS IV SOLN: INTRAVENOUS | Status: DC

## 2011-06-13 MED ORDER — LEVETIRACETAM 500 MG PO TABS
500.0000 mg | ORAL_TABLET | Freq: Two times a day (BID) | ORAL | Status: DC
  Administered 2011-06-13 – 2011-06-17 (×8): 500 mg via ORAL
  Filled 2011-06-13 (×9): qty 1

## 2011-06-13 MED ORDER — VALPROIC ACID 250 MG/5ML PO SYRP
400.0000 mg | ORAL_SOLUTION | Freq: Two times a day (BID) | ORAL | Status: DC
  Administered 2011-06-13 – 2011-06-15 (×4): 400 mg via ORAL
  Filled 2011-06-13 (×5): qty 10

## 2011-06-13 MED ORDER — POLYETHYLENE GLYCOL 3350 17 G PO PACK
17.0000 g | PACK | Freq: Every day | ORAL | Status: DC
  Administered 2011-06-13 – 2011-06-16 (×4): 17 g via ORAL
  Filled 2011-06-13 (×6): qty 1

## 2011-06-13 MED ORDER — BENZTROPINE MESYLATE 1 MG PO TABS
1.0000 mg | ORAL_TABLET | Freq: Two times a day (BID) | ORAL | Status: DC
  Administered 2011-06-14 – 2011-06-17 (×8): 1 mg via ORAL
  Filled 2011-06-13 (×10): qty 1

## 2011-06-13 MED ORDER — FENTANYL CITRATE 0.05 MG/ML IJ SOLN: 25.0000 ug | INTRAMUSCULAR | Status: DC | PRN

## 2011-06-13 NOTE — Progress Notes (Addendum)
[name] WUJ:[id number] is a 46 y.o. male,  Outpatient Primary MD for the patient is [name], MD, MD  Chief Complaint  Patient presents with  . Anemia        Subjective:   [name] today has, does not answer questions, but nods heads and denies headache-chest or Abd pain.   Objective:   Filed Vitals:   06/12/11 1200 06/12/11 1453 06/12/11 2151 06/13/11 0543  BP: 119/65 118/77 92/68 112/72  Pulse:  76 72 80  Temp: 97.7 F (36.5 C) 97.4 F (36.3 C) 98.4 F (36.9 C) 98 F (36.7 C)  TempSrc: Oral Oral Oral Oral  Resp:  16 16 16   Height:  6' (1.829 m)    Weight:  53 kg (116 lb 13.5 oz)    SpO2: 100% 97% 94% 99%    Wt Readings from Last 3 Encounters:  06/12/11 53 kg (116 lb 13.5 oz)  06/12/11 53 kg (116 lb 13.5 oz)  06/12/11 53 kg (116 lb 13.5 oz)     Intake/Output Summary (Last 24 hours) at 06/13/11 1254 Last data filed at 06/13/11 0600  Gross per 24 hour  Intake   1205 ml  Output    950 ml  Net    255 ml    Exam Awake will  follow commands [place].AT,PERRAL Supple Neck,No JVD, No cervical lymphadenopathy appriciated.  Symmetrical Chest wall movement, Good air movement bilaterally, CTAB RRR,No Gallops,Rubs or new Murmurs, No Parasternal Heave +ve B.Sounds, Abd Soft, Non tender, No organomegaly appriciated, No rebound -guarding or rigidity. No Cyanosis, Clubbing or edema, No new Rash or bruise   Data Review  CBC  Lab 06/13/11 0445 06/12/11 0300 06/11/11 0420 06/10/11 0640 06/09/11 1640  WBC 5.8 8.0 7.2 3.4* 3.9*  HGB 9.8* 9.6* 10.5* 10.1* 7.1*  HCT 33.2* 31.9* 34.9* 33.2* 25.2*  PLT 344 323 341 345 389  MCV 75.5* 75.1* 74.9* 75.1* 72.4*  MCH 22.3* 22.6* 22.5* 22.9* 20.4*  MCHC 29.5* 30.1 30.1 30.4 28.2*  RDW 19.2* 18.6* 18.3* 17.9* 17.5*  LYMPHSABS -- -- -- -- --  MONOABS -- -- -- -- --  EOSABS -- -- -- -- --  BASOSABS -- -- -- -- --  BANDABS -- -- -- -- --    Chemistries   Lab 06/13/11 0445 06/12/11 0300 06/11/11 0420  06/10/11 0640 06/09/11 1640  NA 138 142 141 141 139  K 4.3 4.3 3.4* 3.7 3.8  CL 105 109 101 104 104  CO2 25 25 15* 28 29  GLUCOSE 79 76 115* 79 86  BUN 10 4* 10 12 17   CREATININE 0.75 0.64 0.96 0.88 0.89  CALCIUM 8.7 8.8 9.4 9.1 8.9  MG -- -- -- -- --  AST -- -- -- -- --  ALT -- -- -- -- --  ALKPHOS -- -- -- -- --  BILITOT -- -- -- -- --   ------------------------------------------------------------------------------------------------------------------ estimated creatinine clearance is 89.3 ml/min (by C-G formula based on Cr of 0.75). ------------------------------------------------------------------------------------------------------------------ No results found for this basename: HGBA1C:2 in the last 72 hours ------------------------------------------------------------------------------------------------------------------ No results found for this basename: CHOL:2,HDL:2,LDLCALC:2,TRIG:2,CHOLHDL:2,LDLDIRECT:2 in the last 72 hours ------------------------------------------------------------------------------------------------------------------ No results found for this basename: TSH,T4TOTAL,FREET3,T3FREE,THYROIDAB in the last 72 hours ------------------------------------------------------------------------------------------------------------------ No results found for this basename: VITAMINB12:2,FOLATE:2,FERRITIN:2,TIBC:2,IRON:2,RETICCTPCT:2 in the last 72 hours  Coagulation profile No results found for this basename: INR:5,PROTIME:5 in the last 168 hours  No results found for this basename: DDIMER:2 in the last 72 hours  Cardiac Enzymes No results found for this basename:  CK:3,CKMB:3,TROPONINI:3,MYOGLOBIN:3 in the last 168 hours ------------------------------------------------------------------------------------------------------------------ No components found with this basename: POCBNP:3  Micro Results Recent Results (from the past 240 hour(s))  MRSA PCR SCREENING      Status: Normal   Collection Time   06/10/11 12:50 PM      Component Value Range Status Comment   MRSA by PCR NEGATIVE  NEGATIVE  Final     Radiology Reports Ct Head Wo Contrast  06/11/2011  *RADIOLOGY REPORT*  Clinical Data: Altered mental status; no longer verbally responsive.  Uncontrolled spasms.  CT HEAD WITHOUT CONTRAST  Technique:  Contiguous axial images were obtained from the base of the skull through the vertex without contrast.  Comparison: CT of the head performed 12/10/2010  Findings: There is no evidence of acute infarction, mass lesion, or intra- or extra-axial hemorrhage on CT.  Linear foci of increased attenuation along the high left frontal lobe are thought to reflect beam hardening artifact.  Additional beam hardening artifact is noted along the inferior frontal lobes bilaterally.  Marked cerebellar atrophy is noted.  Chronic encephalomalacia is noted at the right temporal lobe, and to a lesser extent at the left temporal lobe, reflecting remote infarct.  Chronic encephalomalacia is also noted bilaterally near the vertex, more prominent on the left.  Scattered subcortical white matter change likely reflects small vessel ischemic microangiopathy.  Prominence of the ventricles suggests mild cortical volume loss. The basal ganglia are unremarkable in appearance.  No mass effect or midline shift is seen.  There is no evidence of fracture; visualized osseous structures are unremarkable in appearance.  The orbits are within normal limits. The paranasal sinuses and mastoid air cells are well-aerated.  No significant soft tissue abnormalities are seen.  IMPRESSION:  1.  No definite acute intracranial pathology seen on CT. 2.  Marked cerebellar atrophy noted. 3.  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  Scattered small vessel ischemic microangiopathy.  Original Report Authenticated By: Tonia Ghent, M.D.    EEG IMPRESSION: This is an abnormal  awake and drowsy EEG due to presence of mild diffuse background slowing. The finding may be suggestive of mild  diffuse cerebral dysfunction. It may be due to toxic metabolic encephalopathy, neurodegenerative disorder and/or bihemispheric structural abnormality. Clinical correlation is suggested.    Scheduled Meds:    . antiseptic oral rinse  15 mL Mouth Rinse q12n4p  . benztropine mesylate  1 mg Intramuscular BID  . bisacodyl  10 mg Rectal Once  . chlorhexidine  15 mL Mouth Rinse BID  . clonazePAM  0.5 mg Oral BID  . feeding supplement  1 Container Oral TID WC  . FLUoxetine  40 mg Oral Daily  . haloperidol  5 mg Oral BID  . lamoTRIgine  100 mg Oral Daily  . levetiracetam  500 mg Intravenous Q12H  . Olopatadine HCl  1 drop Ophthalmic Daily  . pantoprazole (PROTONIX) IV  40 mg Intravenous Q12H  . polyethylene glycol  17 g Oral Daily  . QUEtiapine  400 mg Oral QHS  . sodium chloride  3 mL Intravenous Q12H  . Tamsulosin HCl  0.4 mg Oral Daily  . valproate sodium  15 mg/kg/day Intravenous Q12H  . DISCONTD: polyethylene glycol-electrolytes  4,000 mL Oral Once   Continuous Infusions:    . sodium chloride 75 mL/hr at 06/12/11 1123   PRN Meds:.sodium chloride, acetaminophen, acetaminophen, food thickener, LORazepam, LORazepam, ondansetron, senna-docusate, sodium chloride  Assessment & Plan   1. Iron deficiency anemia due to chronic  blood loss/ history of distal esophageal ulcer/ family history of colon cancer: Status post blood transfusion 06-10-11, H&H stable, no active GI bleeding, continue IV PPI , Dr. Rochele Raring [facility] gastroenterology) following, plans for endoscopy (upper and lower) now postponed for Monday due to poor bowel prep due to history of esophageal ulcer and family history of colon cancer.    2. H/O SEIZURE, GRAND MAL, POST BRAIN INJURY, history of severe closed head injury: had seizure 06-10-11, was drowsy post event so unable to take PO Tegretol, Lamictal - D/W Dr Lyman Speller, Iv  Keppra and Depakote for now, PRN Ativan. Will try to taper off IV seizure Meds in 24-48 hrs. Will D/W Neuro on appropriate regimen again as PO intake better.   3. DYSPHAGIA DUE TO BRAIN INJURY, EPISODIC SILENT ASPIRATION: diet with Thickned liquids with assistance and Asp precautions, cleared by speech.   4. GERD: Continue PPI switched to IV.   5. BENIGN PROSTATIC HYPERTROPHY, WITH URINARY OBSTRUCTION: Continue Flomax once taking PO.   6. Low K - replaced & stable   7. MILD PCM - per dietician.   D/W Mother bedside 06-11-2011, Med Rx OK for Scopes, DNR otherwise.    DVT Prophylaxis: Bilateral SCDs stop Lovenox.   See all Orders from today for further details     Leroy Sea M.D on 06/13/2011 at 12:54 PM  [facility] Office  [phone]

## 2011-06-13 NOTE — Progress Notes (Signed)
MEDICATION RELATED CONSULT NOTE - INITIAL   Pharmacy Consult for Depakote and Keppra iv to po Indication: seizure  Plan:  Pt currently on Keppra 500mg  IV Q12h and Depakote 392mg  IV Q12h. Pharmacy was requested to change to po. Will change Keppra to 500mg  po BID and Valproic acid 400mg  po BID.  Dorethea Clan 06/13/2011,5:28 PM

## 2011-06-13 NOTE — Progress Notes (Signed)
CSW spoke with Bree @ [facility] group home who stated they would be able to take patient back when ready, they would just need discharge summary faxed to facility prior to discharge (fax#: [phone]) and call Bree (cell#: [phone] group home#: [phone]) when ready to be picked up, as they will provide the transportation.   Unice Bailey, LCSWA [phone]

## 2011-06-13 NOTE — Progress Notes (Signed)
The patient is doing well today, and working currently with physical therapy.  Unfortunately we again have to postpone the EGD and colonoscopy because he is not cleaned out. We attempted to give him some further laxative yesterday in the form of magnesium citrate however he vomited this. We will plan to clean him out slowly over the weekend, and hopefully proceed with endoscopic evaluation around the first of the week to evaluate his anemia.

## 2011-06-13 NOTE — Progress Notes (Signed)
Physical Therapy Evaluation Patient Details Name: KAZMIR OKI MRN: [id number] DOB: 12/15/1965 Today's Date: 06/13/2011 Time: 1030-1110 Charge: EVII  Problem List:  Patient Active Problem List  Diagnoses  . FUNGAL DERMATITIS  . LIPOMA  . DEPRESSION  . SEIZURE, GRAND MAL, POST BRAIN INJURY  . HX SEVERE CLOSED HEAD INJURY  . EXOTROPIA  . DYSPHAGIA DUE TO BRAIN INJURY, EPISODIC SILENT ASPIRATION  . GERD  . GASTROENTERITIS  . BENIGN PROSTATIC HYPERTROPHY, WITH URINARY OBSTRUCTION  . OSTEOPOROSIS  . KYPHOSIS  . GAIT DISTURBANCE  . Other urinary incontinence  . NASAL FRACTURE  . LIVER FUNCTION TESTS, ABNORMAL, HX OF  . PEPTIC ULCER DISEASE, HX OF  . VOMITING, PERSISTENT, HX OF  . 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 Iron deficiency anemia due to chronic blood loss    Past Medical History:  Past Medical History  Diagnosis Date  . 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 Depression   . 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 Closed head injury   . Seizures   . Peptic ulcer disease   . Esophageal ulcer   . Malnutrition   . Constipation   . BPH (benign prostatic hyperplasia)   . Smoker    Past Surgical History:  Past Surgical History  Procedure Date  . Gastrectomy     PT Assessment/Plan/Recommendation PT Assessment Clinical Impression Statement: Pt would benefit from skilled PT services in order to improve transfers with pt to decrease burden of care to prepare for d/c.  Pt from Group Home and normally requires assist for ADLs and mobility.  Pt likely close to baseline, so no f/u recommended upon d/c. PT Recommendation/Assessment: Patient will need skilled PT in the acute care venue PT Problem List: Decreased strength;Decreased mobility PT Therapy Diagnosis : Generalized weakness (decreased transfer status) PT Plan PT Frequency: Min 3X/week PT Treatment/Interventions: Functional  mobility training;Therapeutic activities;Therapeutic exercise;Balance training;Neuromuscular re-education;Patient/family education PT Recommendation Recommendations for Other Services:  (seen with OT) Follow Up Recommendations: No PT follow up;Supervision/Assistance - 24 hour Equipment Recommended: None recommended by PT PT Goals  Acute Rehab PT Goals PT Goal Formulation: With patient Time For Goal Achievement: 7 days Pt will go Supine/Side to Sit: with supervision;with rail;with cues (comment type and amount) PT Goal: Supine/Side to Sit - Progress: Goal set today Pt will Sit at [facility] of Bed: with min assist;with bilateral upper extremity support;1-2 min PT Goal: Sit at Edge Of Bed - Progress: Goal set today Pt will go Sit to Supine/Side: with min assist PT Goal: Sit to Supine/Side - Progress: Goal set today Pt will go Sit to Stand: with min assist PT Goal: Sit to Stand - Progress: Goal set today Pt will go Stand to Sit: with min assist PT Goal: Stand to Sit - Progress: Goal set today Pt will Transfer Bed to Chair/Chair to Bed: with min assist PT Transfer Goal: Bed to Chair/Chair to Bed - Progress: Goal set today  PT Evaluation Precautions/Restrictions  Precautions Precautions: Fall Prior Functioning  Home Living Lives With: Alone (Has caregivers that help daily.) Type of Home: Apartment Home Layout: One level Bathroom Shower/Tub: [employer] manager Accessibility: Yes Home Adaptive Equipment: Wheelchair - manual;Bedside commode/3-in-1 Prior Function Level of Independence: Independent with transfers;Needs assistance with ADLs Bath: Moderate Comments: Per OT phone call with pt's mother: pt requires assist for all activites and mobility Cognition Cognition Arousal/Alertness: Awake/alert Overall Cognitive Status: History of cognitive impairments History of Cognitive  Impairment: Appears at baseline functioning Orientation Level: Oriented to person Cognition - Other  Comments: Pt with hx of closed brain injury.  Pt very soft spoken and difficult to understand. Sensation/Coordination   Extremity Assessment RLE Strength RLE Overall Strength Comments: grossly at least 3/5 as pt is able to perform full AROM with increased time LLE Strength LLE Overall Strength Comments: grossly at least 3/5 as pt is able to perform full AROM with increased time Mobility (including Balance) Bed Mobility Bed Mobility: Yes Supine to Sit: 4: Min assist;HOB elevated (Comment degrees) Supine to Sit Details (indicate cue type and reason): increased verbal cues for technique, tactile cues for LEs, pt takes increased amount of time to perform movement Sitting - Scoot to Edge of Bed: 5: Supervision Sitting - Scoot to Edge of Bed Details (indicate cue type and reason): increased verbal cues to scoot forward and increased time Transfers Transfers: Yes Sit to Stand: 3: Mod assist;From bed;From elevated surface;With upper extremity assist Sit to Stand Details (indicate cue type and reason): performed twice, verbal and visual cues for hand placement Stand to Sit: 3: Mod assist;With upper extremity assist;With armrests;To chair/3-in-1;To bed Stand to Sit Details: verbal and visual cues for hand placement Stand Pivot Transfers: 3: Mod assist;With armrests Stand Pivot Transfer Details (indicate cue type and reason): performed by OT in front and PT guided hips into chair, pt unable to turn LEs to assist with transfer but able to assist with upper body by reaching for armrests Ambulation/Gait Ambulation/Gait:  (pt unable)    Exercise    End of Session PT - End of Session Activity Tolerance: Patient tolerated treatment well Patient left: in chair;with call bell in reach Nurse Communication: Mobility status for transfers;Need for lift equipment (stedy) General Cognition: Impaired, at baseline  Gael Delude,KATHrine E 06/13/2011, 12:45 PM Pager: [phone]

## 2011-06-13 NOTE — Progress Notes (Signed)
Occupational Therapy Evaluation Patient Details Name: [name] MRN: [id number] DOB: 1965/08/17 Today's Date: 06/13/2011 10:30-11:11  evII Problem List:  Patient Active Problem List  Diagnoses  . FUNGAL DERMATITIS  . LIPOMA  . DEPRESSION  . SEIZURE, GRAND MAL, POST BRAIN INJURY  . HX SEVERE CLOSED HEAD INJURY  . EXOTROPIA  . DYSPHAGIA DUE TO BRAIN INJURY, EPISODIC SILENT ASPIRATION  . GERD  . GASTROENTERITIS  . BENIGN PROSTATIC HYPERTROPHY, WITH URINARY OBSTRUCTION  . OSTEOPOROSIS  . KYPHOSIS  . GAIT DISTURBANCE  . Other urinary incontinence  . NASAL FRACTURE  . LIVER FUNCTION TESTS, ABNORMAL, HX OF  . PEPTIC ULCER DISEASE, HX OF  . VOMITING, PERSISTENT, HX OF  . 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 Iron deficiency anemia due to chronic blood loss    Past Medical History:  Past Medical History  Diagnosis Date  . 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 Depression   . 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 Closed head injury   . Seizures   . Peptic ulcer disease   . Esophageal ulcer   . Malnutrition   . Constipation   . BPH (benign prostatic hyperplasia)   . Smoker    Past Surgical History:  Past Surgical History  Procedure Date  . Gastrectomy     OT Assessment/Plan/Recommendation OT Assessment Clinical Impression Statement: Pleasant 46 yr old male admitted with decreased hemoglobin and previous history of severe brain injury.  Pt presents with impairments in his posture, balance, and functional performance of basic selfcare tasks.  Pt prior to this admission required assist with all selfcare tasks and functiona transfers, but feel he may need slightly more assistance now than previously.  Feel he will benefit from trial OT services to help address selfcare independence.  Feel pt will benefit from home health OT to further assess functional abilities in familiar setting. OT  Recommendation/Assessment: Patient will need skilled OT in the acute care venue OT Problem List: Decreased strength;Decreased coordination;Decreased activity tolerance;Impaired balance (sitting and/or standing);Decreased knowledge of use of DME or AE;Impaired UE functional use OT Therapy Diagnosis : Generalized weakness OT Plan OT Frequency: Min 1X/week OT Treatment/Interventions: Self-care/ADL training;Therapeutic activities;Neuromuscular education;DME and/or AE instruction;Balance training;Patient/family education OT Recommendation Follow Up Recommendations: Home health OT Equipment Recommended: None recommended by PT Individuals Consulted Consulted and Agree with Results and Recommendations: Patient OT Goals Acute Rehab OT Goals OT Goal Formulation: Patient unable to participate in goal setting Time For Goal Achievement: 2 weeks ADL Goals Pt Will Perform Eating: with set-up;Supine, head of bed up ADL Goal: Eating - Progress: Goal set today Pt Will Perform Grooming: with set-up;Supported;Sitting, chair ADL Goal: Grooming - Progress: Goal set today Pt Will Perform Upper Body Bathing: with set-up;Unsupported;Sitting, edge of bed ADL Goal: Upper Body Bathing - Progress: Goal set today Pt Will Perform Lower Body Bathing: with mod assist;Sit to stand from bed ADL Goal: Lower Body Bathing - Progress: Goal set today Pt Will Transfer to Toilet: with min assist;3-in-1;Squat pivot transfer ADL Goal: Toilet Transfer - Progress: Goal set today  OT Evaluation Precautions/Restrictions  Precautions Precautions: Fall Required Braces or Orthoses: No Restrictions Weight Bearing Restrictions: No Prior Functioning Home Living Lives With: Other (Comment) (Per mother pt is at a group home.) Receives Help From: Personal care attendant Type of Home: Other (Comment) (Group Home.) Home Layout: One level Bathroom Shower/Tub: [employer] manager Accessibility: Yes How Accessible: Accessible via  wheelchair Home  Adaptive Equipment: Wheelchair - manual;Bedside commode/3-in-1 Prior Function Level of Independence: Needs assistance with ADLs Bath: Moderate Comments: Per OT phone call with pt's mother: pt requires assist for all activites and mobility ADL ADL Eating/Feeding: Performed;Supervision/safety Eating/Feeding Details (indicate cue type and reason): Pt able to drink coffee from cup. Where Assessed - Eating/Feeding: Bed level Grooming: Simulated;Minimal assistance Where Assessed - Grooming: Supine, head of bed up Upper Body Bathing: Simulated;Minimal assistance Where Assessed - Upper Body Bathing: Supine, head of bed up Lower Body Bathing: Maximal assistance Where Assessed - Lower Body Bathing: Sit to stand from bed Upper Body Dressing: Simulated;Minimal assistance Where Assessed - Upper Body Dressing: Sitting, bed;Unsupported Lower Body Dressing: Simulated;+1 Total assistance Where Assessed - Lower Body Dressing: Sit to stand from bed Toilet Transfer: Simulated;Moderate assistance Toilet Transfer Method: Stand pivot Toileting - Clothing Manipulation: Simulated;Maximal assistance Toileting - Hygiene: Simulated;Maximal assistance Tub/Shower Transfer: Not assessed Tub/Shower Transfer Method: Not assessed ADL Comments: Pt currently needs min to max asssit for all selfcare tasks.  He demonstrates severe forward trunk and cervical flexion in sitting and standing.  All movements are very slow and deliberate secondary to his previous brain injury.  Per his mother he needed assist with all selfcare tasks. Vision/Perception  Vision - History Baseline Vision: Other (comment) (Noted dysconjugate gaze but this was premorbid.) Patient Visual Report: No change from baseline Vision - Assessment Eye Alignment: Impaired (comment) (dysconjugate gaze from previous brain injury.) Vision Assessment: Vision not tested Perception Perception: Not tested Praxis Praxis: Not  tested Cognition Cognition Arousal/Alertness: Awake/alert Overall Cognitive Status: History of cognitive impairments History of Cognitive Impairment: Appears at baseline functioning Cognition - Other Comments: Pt with hx of closed brain injury.  Pt very soft spoken and difficult to understand. Sensation/Coordination Sensation Light Touch: Not tested (Unable to determine secondary to pt's inconsistent responses) Stereognosis: Not tested Hot/Cold: Not tested Proprioception: Not tested Coordination Gross Motor Movements are Fluid and Coordinated: No Fine Motor Movements are Fluid and Coordinated: No Coordination and Movement Description: Pt with lecreased LUE coordination secondary to previous brain injury.  Moderate synergy patterns noted with attempted LUE use.  Pt able to intergrate as an active asssit but much slower when compared to the right side. Extremity Assessment RUE Assessment RUE Assessment: Within Functional Limits LUE Assessment LUE Assessment: Exceptions to WFL LUE AROM (degrees) LUE Overall AROM Comments: Pt with Brunnstrom stage V to VI movement in the LUE and hand secondary to previous brain injury.  Able to utilize as an active assist but with increased delay in functional movement patterns. Mobility  Bed Mobility Bed Mobility: Yes Supine to Sit: 4: Min assist Supine to Sit Details (indicate cue type and reason): increased verbal cues for technique, tactile cues for LEs, pt takes increased amount of time to perform movement Sitting - Scoot to Edge of Bed: 4: Min assist Sitting - Scoot to Edge of Bed Details (indicate cue type and reason): increased time to scoot to EOB. Transfers Transfers: Yes Sit to Stand: 3: Mod assist;From bed;With upper extremity assist Sit to Stand Details (indicate cue type and reason): performed twice, verbal and visual cues for hand placement Stand to Sit: 3: Mod assist;With upper extremity assist;With armrests;To chair/3-in-1;To  bed Stand to Sit Details: verbal and visual cues for hand placement Exercises   End of Session OT - End of Session Activity Tolerance: Patient tolerated treatment well Patient left: in chair;with call bell in reach Nurse Communication: Mobility status for transfers General Behavior During Session: [facility] for tasks  performed Cognition: Impaired, at baseline   Adan Baehr OTR/L 06/13/2011, 2:17 PM  Pager number [phone]

## 2011-06-13 NOTE — Progress Notes (Signed)
Speech Language/Pathology Speech Pathology: Dysphagia Treatment Note [phone] Patient was observed with :Nectar liquids only due to pt on clears *nectar d/t aspiration, 4 ounces of nectar juice given by SLP  Pt pending colonoscopy, possible next week.  RN reports pt tolerating nectar liquids well.  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    Pain:   none Intervention Required:   No   Goals: Goals Partially Met SLP to follow up to aid in solid food consistency eval and recommendations (when approved by GI)   Bruce Burnet, MS [facility] SLP [phone]

## 2011-06-14 LAB — PREALBUMIN: Prealbumin: 19 mg/dL (ref 17.0–34.0)

## 2011-06-14 MED ORDER — LACTATED RINGERS IV SOLN: INTRAVENOUS | Status: DC

## 2011-06-14 MED ORDER — STARCH (THICKENING) PO POWD: ORAL | Status: DC | PRN

## 2011-06-14 MED ORDER — SODIUM CHLORIDE 0.9 % IV SOLN
INTRAVENOUS | Status: AC
  Administered 2011-06-14 – 2011-06-15 (×2): via INTRAVENOUS

## 2011-06-14 MED ORDER — LORAZEPAM 0.5 MG PO TABS: 0.5000 mg | ORAL_TABLET | Freq: Four times a day (QID) | ORAL | Status: DC | PRN

## 2011-06-14 MED ORDER — FOOD THICKENER (THICKENUP CLEAR)
1.0000 | ORAL | Status: DC | PRN
  Filled 2011-06-14: qty 1.4

## 2011-06-14 NOTE — Progress Notes (Signed)
[name] [id number] is a 46 y.o. male,  Outpatient Primary MD for the patient is [name], MD, MD  Chief Complaint  Patient presents with  . Anemia        Subjective:   [name] today has, does not answer questions, but nods heads and denies headache-chest or Abd pain.   Objective:   Filed Vitals:   06/13/11 0543 06/13/11 1309 06/13/11 2103 06/14/11 0443  BP: 112/72 93/63 95/64  92/57  Pulse: 80 67 72 95  Temp: 98 F (36.7 C) 97.4 F (36.3 C) 98 F (36.7 C) 97.7 F (36.5 C)  TempSrc: Oral Oral Oral Axillary  Resp: 16 16 14 16   Height:      Weight:      SpO2: 99% 97% 99% 92%    Wt Readings from Last 3 Encounters:  06/12/11 53 kg (116 lb 13.5 oz)  06/12/11 53 kg (116 lb 13.5 oz)  06/12/11 53 kg (116 lb 13.5 oz)     Intake/Output Summary (Last 24 hours) at 06/14/11 0837 Last data filed at 06/14/11 0443  Gross per 24 hour  Intake   1285 ml  Output    675 ml  Net    610 ml    Exam Awake will  follow commands [place].AT,PERRAL Supple Neck,No JVD, No cervical lymphadenopathy appriciated.  Symmetrical Chest wall movement, Good air movement bilaterally, CTAB RRR,No Gallops,Rubs or new Murmurs, No Parasternal Heave +ve B.Sounds, Abd Soft, Non tender, No organomegaly appriciated, No rebound -guarding or rigidity. No Cyanosis, Clubbing or edema, No new Rash or bruise   Data Review  CBC  Lab 06/13/11 0445 06/12/11 0300 06/11/11 0420 06/10/11 0640 06/09/11 1640  WBC 5.8 8.0 7.2 3.4* 3.9*  HGB 9.8* 9.6* 10.5* 10.1* 7.1*  HCT 33.2* 31.9* 34.9* 33.2* 25.2*  PLT 344 323 341 345 389  MCV 75.5* 75.1* 74.9* 75.1* 72.4*  MCH 22.3* 22.6* 22.5* 22.9* 20.4*  MCHC 29.5* 30.1 30.1 30.4 28.2*  RDW 19.2* 18.6* 18.3* 17.9* 17.5*  LYMPHSABS -- -- -- -- --  MONOABS -- -- -- -- --  EOSABS -- -- -- -- --  BASOSABS -- -- -- -- --  BANDABS -- -- -- -- --    Chemistries   Lab 06/13/11 0445 06/12/11 0300 06/11/11 0420 06/10/11 0640 06/09/11 1640  NA  138 142 141 141 139  K 4.3 4.3 3.4* 3.7 3.8  CL 105 109 101 104 104  CO2 25 25 15* 28 29  GLUCOSE 79 76 115* 79 86  BUN 10 4* 10 12 17   CREATININE 0.75 0.64 0.96 0.88 0.89  CALCIUM 8.7 8.8 9.4 9.1 8.9  MG -- -- -- -- --  AST -- -- -- -- --  ALT -- -- -- -- --  ALKPHOS -- -- -- -- --  BILITOT -- -- -- -- --   ------------------------------------------------------------------------------------------------------------------ estimated creatinine clearance is 89.3 ml/min (by C-G formula based on Cr of 0.75). ------------------------------------------------------------------------------------------------------------------ No results found for this basename: HGBA1C:2 in the last 72 hours ------------------------------------------------------------------------------------------------------------------ No results found for this basename: CHOL:2,HDL:2,LDLCALC:2,TRIG:2,CHOLHDL:2,LDLDIRECT:2 in the last 72 hours ------------------------------------------------------------------------------------------------------------------ No results found for this basename: TSH,T4TOTAL,FREET3,T3FREE,THYROIDAB in the last 72 hours ------------------------------------------------------------------------------------------------------------------ No results found for this basename: VITAMINB12:2,FOLATE:2,FERRITIN:2,TIBC:2,IRON:2,RETICCTPCT:2 in the last 72 hours  Coagulation profile No results found for this basename: INR:5,PROTIME:5 in the last 168 hours  No results found for this basename: DDIMER:2 in the last 72 hours  Cardiac Enzymes No results found for this basename: CK:3,CKMB:3,TROPONINI:3,MYOGLOBIN:3 in the last 168 hours ------------------------------------------------------------------------------------------------------------------  No components found with this basename: POCBNP:3  Micro Results Recent Results (from the past 240 hour(s))  MRSA PCR SCREENING     Status: Normal   Collection Time    06/10/11 12:50 PM      Component Value Range Status Comment   MRSA by PCR NEGATIVE  NEGATIVE  Final     Radiology Reports Ct Head Wo Contrast  06/11/2011  *RADIOLOGY REPORT*  Clinical Data: Altered mental status; no longer verbally responsive.  Uncontrolled spasms.  CT HEAD WITHOUT CONTRAST  Technique:  Contiguous axial images were obtained from the base of the skull through the vertex without contrast.  Comparison: CT of the head performed 12/10/2010  Findings: There is no evidence of acute infarction, mass lesion, or intra- or extra-axial hemorrhage on CT.  Linear foci of increased attenuation along the high left frontal lobe are thought to reflect beam hardening artifact.  Additional beam hardening artifact is noted along the inferior frontal lobes bilaterally.  Marked cerebellar atrophy is noted.  Chronic encephalomalacia is noted at the right temporal lobe, and to a lesser extent at the left temporal lobe, reflecting remote infarct.  Chronic encephalomalacia is also noted bilaterally near the vertex, more prominent on the left.  Scattered subcortical white matter change likely reflects small vessel ischemic microangiopathy.  Prominence of the ventricles suggests mild cortical volume loss. The basal ganglia are unremarkable in appearance.  No mass effect or midline shift is seen.  There is no evidence of fracture; visualized osseous structures are unremarkable in appearance.  The orbits are within normal limits. The paranasal sinuses and mastoid air cells are well-aerated.  No significant soft tissue abnormalities are seen.  IMPRESSION:  1.  No definite acute intracranial pathology seen on CT. 2.  Marked cerebellar atrophy noted. 3.  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  Scattered small vessel ischemic microangiopathy.  Original Report Authenticated By: Tonia Ghent, M.D.    EEG IMPRESSION: This is an abnormal awake and drowsy EEG due to presence of  mild diffuse background slowing. The finding may be suggestive of mild  diffuse cerebral dysfunction. It may be due to toxic metabolic encephalopathy, neurodegenerative disorder and/or bihemispheric structural abnormality. Clinical correlation is suggested.    Scheduled Meds:    . antiseptic oral rinse  15 mL Mouth Rinse q12n4p  . benztropine  1 mg Oral BID  . bisacodyl  10 mg Rectal Once  . chlorhexidine  15 mL Mouth Rinse BID  . clonazePAM  0.5 mg Oral BID  . feeding supplement  1 Container Oral TID WC  . FLUoxetine  40 mg Oral Daily  . haloperidol  5 mg Oral BID  . levETIRAcetam  500 mg Oral BID  . Olopatadine HCl  1 drop Ophthalmic Daily  . pantoprazole (PROTONIX) IV  40 mg Intravenous Q12H  . polyethylene glycol  17 g Oral Daily  . QUEtiapine  400 mg Oral QHS  . sodium chloride  3 mL Intravenous Q12H  . Tamsulosin HCl  0.4 mg Oral Daily  . Valproic Acid  400 mg Oral BID  . DISCONTD: benztropine mesylate  1 mg Intramuscular BID  . DISCONTD: lamoTRIgine  100 mg Oral Daily  . DISCONTD: levetiracetam  500 mg Intravenous Q12H  . DISCONTD: valproate sodium  15 mg/kg/day Intravenous Q12H   Continuous Infusions:    . lactated ringers    . DISCONTD: lactated ringers     PRN Meds:.sodium chloride, acetaminophen, acetaminophen, food thickener, LORazepam, ondansetron, senna-docusate,  sodium chloride, DISCONTD: fentaNYL, DISCONTD: LORazepam, DISCONTD: LORazepam, DISCONTD: promethazine  Assessment & Plan   1. Iron deficiency anemia due to chronic blood loss/ history of distal esophageal ulcer/ family history of colon cancer: Status post blood transfusion 06-10-11, H&H stable, no active GI bleeding, continue IV PPI , Dr. Rochele Raring [facility] gastroenterology) following, plans for endoscopy (upper and lower) now postponed for Monday due to poor bowel prep due to history of esophageal ulcer and family history of colon cancer. Gentle IVF as only on clears.   2. H/O SEIZURE, GRAND MAL, POST BRAIN  INJURY, history of severe closed head injury: had seizure 06-10-11, was drowsy post event so was  unable to take PO Tegretol, Lamictal - D/W Dr Lyman Speller X2 , Iv Keppra and Depakote and then to PO of both Meds PRN Ativan. Stop home antiseizure meds, and outpt Neuro follow up per Dr Lyman Speller.   3. DYSPHAGIA DUE TO BRAIN INJURY, EPISODIC SILENT ASPIRATION: diet with Thickned liquids with assistance and Asp precautions, cleared by speech.   4. GERD: Continue PPI switched to IV.   5. BENIGN PROSTATIC HYPERTROPHY, WITH URINARY OBSTRUCTION: Continue Flomax once taking PO.   6. Low K - replaced & stable   7. MILD PCM - per dietician.   D/W Mother bedside 06-11-2011, Med Rx OK for [place], DNR otherwise.    DVT Prophylaxis: Bilateral SCDs stop Lovenox.   See all Orders from today for further details     Leroy Sea M.D on 06/14/2011 at 8:37 AM  [facility] Office  [phone]

## 2011-06-14 NOTE — Progress Notes (Signed)
MEDICATION RELATED CONSULT NOTE - Follow-Up  Pharmacy Consult for Keppra/Depakote Indication: Seizure  Allergies  Allergen Reactions  . Pineapple Anaphylaxis    Patient Measurements: Height: 6' (182.9 cm) Weight: 116 lb 13.5 oz (53 kg) (BED SCALE) IBW/kg (Calculated) : 77.6   Labs:  Basename 06/13/11 0445 06/12/11 0300  WBC 5.8 8.0  HGB 9.8* 9.6*  HCT 33.2* 31.9*  PLT 344 323  APTT -- --  CREATININE 0.75 0.64  LABCREA -- --  CREATININE 0.75 0.64  CREAT24HRUR -- --  MG -- --  PHOS -- --  ALBUMIN -- --  PROT -- --  ALBUMIN -- --  AST -- --  ALT -- --  ALKPHOS -- --  BILITOT -- --  BILIDIR -- --  IBILI -- --   Estimated Creatinine Clearance: 89.3 ml/min (by C-G formula based on Cr of 0.75).   Microbiology: Recent Results (from the past 720 hour(s))  MRSA PCR SCREENING     Status: Normal   Collection Time   06/10/11 12:50 PM      Component Value Range Status Comment   MRSA by PCR NEGATIVE  NEGATIVE  Final     Medical History: Past Medical History  Diagnosis Date  . 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 Depression   . 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 Closed head injury   . Seizures   . Peptic ulcer disease   . Esophageal ulcer   . Malnutrition   . Constipation   . BPH (benign prostatic hyperplasia)   . Smoker     Medications:  Scheduled:     . antiseptic oral rinse  15 mL Mouth Rinse q12n4p  . benztropine  1 mg Oral BID  . bisacodyl  10 mg Rectal Once  . chlorhexidine  15 mL Mouth Rinse BID  . clonazePAM  0.5 mg Oral BID  . feeding supplement  1 Container Oral TID WC  . FLUoxetine  40 mg Oral Daily  . haloperidol  5 mg Oral BID  . levETIRAcetam  500 mg Oral BID  . Olopatadine HCl  1 drop Ophthalmic Daily  . pantoprazole (PROTONIX) IV  40 mg Intravenous Q12H  . polyethylene glycol  17 g Oral Daily  . QUEtiapine  400 mg Oral QHS  . sodium chloride  3 mL Intravenous Q12H  . Tamsulosin HCl   0.4 mg Oral Daily  . Valproic Acid  400 mg Oral BID  . DISCONTD: benztropine mesylate  1 mg Intramuscular BID  . DISCONTD: lamoTRIgine  100 mg Oral Daily  . DISCONTD: levetiracetam  500 mg Intravenous Q12H  . DISCONTD: valproate sodium  15 mg/kg/day Intravenous Q12H   Infusions:     . lactated ringers      Assessment: Keppra and Valproic Acid changed to PO last night. No reported seizures this AM.  Goal of Therapy:  Seizure control  Plan:  Continue current Valproic acid and Keppra dosing for now. Will check a valproic acid trough level tomorrow AM to ensure appropriate dosing as this medication has now reached steady state  Berkley Harvey 06/14/2011,7:54 AM

## 2011-06-15 LAB — BASIC METABOLIC PANEL
BUN: 5 mg/dL — ABNORMAL LOW (ref 6–23)
CO2: 28 mEq/L (ref 19–32)
Chloride: 107 mEq/L (ref 96–112)
Creatinine, Ser: 0.81 mg/dL (ref 0.50–1.35)
Glucose, Bld: 86 mg/dL (ref 70–99)

## 2011-06-15 LAB — MAGNESIUM: Magnesium: 1.8 mg/dL (ref 1.5–2.5)

## 2011-06-15 LAB — VALPROIC ACID LEVEL: Valproic Acid Lvl: 38.9 ug/mL — ABNORMAL LOW (ref 50.0–100.0)

## 2011-06-15 MED ORDER — OLOPATADINE HCL 0.1 % OP SOLN
1.0000 [drp] | Freq: Two times a day (BID) | OPHTHALMIC | Status: DC
  Administered 2011-06-15 – 2011-06-16 (×4): 1 [drp] via OPHTHALMIC
  Filled 2011-06-15: qty 5

## 2011-06-15 MED ORDER — BOOST / RESOURCE BREEZE PO LIQD: 1.0000 | ORAL | Status: DC | PRN

## 2011-06-15 MED ORDER — DIVALPROEX SODIUM 500 MG PO DR TAB
500.0000 mg | DELAYED_RELEASE_TABLET | Freq: Two times a day (BID) | ORAL | Status: DC
  Administered 2011-06-15 – 2011-06-17 (×4): 500 mg via ORAL
  Filled 2011-06-15 (×5): qty 1

## 2011-06-15 MED ORDER — CLONAZEPAM 0.5 MG PO TABS
0.5000 mg | ORAL_TABLET | Freq: Every day | ORAL | Status: DC
  Administered 2011-06-16: 0.5 mg via ORAL
  Filled 2011-06-15: qty 1

## 2011-06-15 MED ORDER — ENSURE CLINICAL ST REVIGOR PO LIQD
237.0000 mL | Freq: Two times a day (BID) | ORAL | Status: DC
  Administered 2011-06-15 – 2011-06-16 (×4): 237 mL via ORAL

## 2011-06-15 MED ORDER — QUETIAPINE FUMARATE 200 MG PO TABS
200.0000 mg | ORAL_TABLET | Freq: Every day | ORAL | Status: DC
  Administered 2011-06-15: 200 mg via ORAL
  Filled 2011-06-15 (×2): qty 1

## 2011-06-15 NOTE — Progress Notes (Signed)
[name] [id number] is a 46 y.o. male,  Outpatient Primary MD for the patient is [name], MD, MD  Chief Complaint  Patient presents with  . Anemia        Subjective:   [name] today has, does not answer questions, but nods heads and denies headache-chest or Abd pain. He is little more drowsy on 06/15/2011.   Objective:   Filed Vitals:   06/14/11 0443 06/14/11 1500 06/14/11 2113 06/15/11 0550  BP: 92/57 95/61 94/61  93/57  Pulse: 95 78 73 93  Temp: 97.7 F (36.5 C) 97.5 F (36.4 C) 97.7 F (36.5 C) 98.6 F (37 C)  TempSrc: Axillary Oral Oral Oral  Resp: 16 16 16 19   Height:      Weight:      SpO2: 92% 96% 97% 95%    Wt Readings from Last 3 Encounters:  06/12/11 53 kg (116 lb 13.5 oz)  06/12/11 53 kg (116 lb 13.5 oz)  06/12/11 53 kg (116 lb 13.5 oz)     Intake/Output Summary (Last 24 hours) at 06/15/11 1314 Last data filed at 06/15/11 0400  Gross per 24 hour  Intake  762.5 ml  Output      0 ml  Net  762.5 ml    Exam Awake will  follow commands [place].AT,PERRAL Supple Neck,No JVD, No cervical lymphadenopathy appriciated.  Symmetrical Chest wall movement, Good air movement bilaterally, CTAB RRR,No Gallops,Rubs or new Murmurs, No Parasternal Heave +ve B.Sounds, Abd Soft, Non tender, No organomegaly appriciated, No rebound -guarding or rigidity. No Cyanosis, Clubbing or edema, No new Rash or bruise   Data Review  CBC  Lab 06/13/11 0445 06/12/11 0300 06/11/11 0420 06/10/11 0640 06/09/11 1640  WBC 5.8 8.0 7.2 3.4* 3.9*  HGB 9.8* 9.6* 10.5* 10.1* 7.1*  HCT 33.2* 31.9* 34.9* 33.2* 25.2*  PLT 344 323 341 345 389  MCV 75.5* 75.1* 74.9* 75.1* 72.4*  MCH 22.3* 22.6* 22.5* 22.9* 20.4*  MCHC 29.5* 30.1 30.1 30.4 28.2*  RDW 19.2* 18.6* 18.3* 17.9* 17.5*  LYMPHSABS -- -- -- -- --  MONOABS -- -- -- -- --  EOSABS -- -- -- -- --  BASOSABS -- -- -- -- --  BANDABS -- -- -- -- --    Chemistries   Lab 06/15/11 0532 06/13/11 0445  06/12/11 0300 06/11/11 0420 06/10/11 0640  NA 144 138 142 141 141  K 3.7 4.3 4.3 3.4* 3.7  CL 107 105 109 101 104  CO2 28 25 25  15* 28  GLUCOSE 86 79 76 115* 79  BUN 5* 10 4* 10 12  CREATININE 0.81 0.75 0.64 0.96 0.88  CALCIUM 8.9 8.7 8.8 9.4 9.1  MG 1.8 -- -- -- --  AST -- -- -- -- --  ALT -- -- -- -- --  ALKPHOS -- -- -- -- --  BILITOT -- -- -- -- --   ------------------------------------------------------------------------------------------------------------------ estimated creatinine clearance is 88.2 ml/min (by C-G formula based on Cr of 0.81). ------------------------------------------------------------------------------------------------------------------ No results found for this basename: HGBA1C:2 in the last 72 hours ------------------------------------------------------------------------------------------------------------------ No results found for this basename: CHOL:2,HDL:2,LDLCALC:2,TRIG:2,CHOLHDL:2,LDLDIRECT:2 in the last 72 hours ------------------------------------------------------------------------------------------------------------------ No results found for this basename: TSH,T4TOTAL,FREET3,T3FREE,THYROIDAB in the last 72 hours ------------------------------------------------------------------------------------------------------------------ No results found for this basename: VITAMINB12:2,FOLATE:2,FERRITIN:2,TIBC:2,IRON:2,RETICCTPCT:2 in the last 72 hours  Coagulation profile No results found for this basename: INR:5,PROTIME:5 in the last 168 hours  No results found for this basename: DDIMER:2 in the last 72 hours  Cardiac Enzymes No results found for this basename: CK:3,CKMB:3,TROPONINI:3,MYOGLOBIN:3  in the last 168 hours ------------------------------------------------------------------------------------------------------------------ No components found with this basename: POCBNP:3  Micro Results Recent Results (from the past 240 hour(s))  MRSA PCR  SCREENING     Status: Normal   Collection Time   06/10/11 12:50 PM      Component Value Range Status Comment   MRSA by PCR NEGATIVE  NEGATIVE  Final     Radiology Reports Ct Head Wo Contrast  06/11/2011  *RADIOLOGY REPORT*  Clinical Data: Altered mental status; no longer verbally responsive.  Uncontrolled spasms.  CT HEAD WITHOUT CONTRAST  Technique:  Contiguous axial images were obtained from the base of the skull through the vertex without contrast.  Comparison: CT of the head performed 12/10/2010  Findings: There is no evidence of acute infarction, mass lesion, or intra- or extra-axial hemorrhage on CT.  Linear foci of increased attenuation along the high left frontal lobe are thought to reflect beam hardening artifact.  Additional beam hardening artifact is noted along the inferior frontal lobes bilaterally.  Marked cerebellar atrophy is noted.  Chronic encephalomalacia is noted at the right temporal lobe, and to a lesser extent at the left temporal lobe, reflecting remote infarct.  Chronic encephalomalacia is also noted bilaterally near the vertex, more prominent on the left.  Scattered subcortical white matter change likely reflects small vessel ischemic microangiopathy.  Prominence of the ventricles suggests mild cortical volume loss. The basal ganglia are unremarkable in appearance.  No mass effect or midline shift is seen.  There is no evidence of fracture; visualized osseous structures are unremarkable in appearance.  The orbits are within normal limits. The paranasal sinuses and mastoid air cells are well-aerated.  No significant soft tissue abnormalities are seen.  IMPRESSION:  1.  No definite acute intracranial pathology seen on CT. 2.  Marked cerebellar atrophy noted. 3.  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  Scattered small vessel ischemic microangiopathy.  Original Report Authenticated By: Tonia Ghent, M.D.    EEG IMPRESSION: This is  an abnormal awake and drowsy EEG due to presence of mild diffuse background slowing. The finding may be suggestive of mild  diffuse cerebral dysfunction. It may be due to toxic metabolic encephalopathy, neurodegenerative disorder and/or bihemispheric structural abnormality. Clinical correlation is suggested.    Scheduled Meds:    . antiseptic oral rinse  15 mL Mouth Rinse q12n4p  . benztropine  1 mg Oral BID  . bisacodyl  10 mg Rectal Once  . chlorhexidine  15 mL Mouth Rinse BID  . clonazePAM  0.5 mg Oral QHS  . divalproex  500 mg Oral BID  . feeding supplement  237 mL Oral BID  . FLUoxetine  40 mg Oral Daily  . haloperidol  5 mg Oral BID  . levETIRAcetam  500 mg Oral BID  . olopatadine  1 drop Both Eyes BID  . pantoprazole (PROTONIX) IV  40 mg Intravenous Q12H  . polyethylene glycol  17 g Oral Daily  . QUEtiapine  200 mg Oral QHS  . sodium chloride  3 mL Intravenous Q12H  . Tamsulosin HCl  0.4 mg Oral Daily  . DISCONTD: clonazePAM  0.5 mg Oral BID  . DISCONTD: feeding supplement  1 Container Oral TID WC  . DISCONTD: Olopatadine HCl  1 drop Ophthalmic Daily  . DISCONTD: QUEtiapine  400 mg Oral QHS  . DISCONTD: Valproic Acid  400 mg Oral BID   Continuous Infusions:    . sodium chloride 50 mL/hr at 06/15/11 0000  PRN Meds:.sodium chloride, acetaminophen, acetaminophen, feeding supplement, food thickener, ondansetron, senna-docusate, sodium chloride, DISCONTD: LORazepam  Assessment & Plan   1. Iron deficiency anemia due to chronic blood loss/ history of distal esophageal ulcer/ family history of colon cancer: Status post blood transfusion 06-10-11, H&H stable, no active GI bleeding, continue IV PPI , Dr. Rochele Raring [facility] gastroenterology) following, plans for endoscopy (upper and lower) now postponed for Monday due to poor bowel prep due to history of esophageal ulcer and family history of colon cancer. Gentle IVF as only on clears.   2. H/O SEIZURE, GRAND MAL, POST BRAIN INJURY,  history of severe closed head injury: had seizure 06-10-11, was drowsy post event so was  unable to take PO Tegretol, Lamictal - D/W Dr Lyman Speller X2 , Iv Keppra and Depakote and then to PO of both Meds PRN Ativan. Stop home antiseizure meds, and outpt Neuro follow up per Dr Lyman Speller.   3. DYSPHAGIA DUE TO BRAIN INJURY, EPISODIC SILENT ASPIRATION: diet with Thickned liquids with assistance and Asp precautions, cleared by speech.   4. GERD: Continue PPI switched to IV.   5. BENIGN PROSTATIC HYPERTROPHY, WITH URINARY OBSTRUCTION: Continue Flomax once taking PO.   6. Low K - replaced & stable   7. MILD PCM clinically - per dietician.   9. Patient appears to be somewhat somnolent am not sure if this is his baseline, however I would reduce his benzodiazepine does and his Seroquel dose to see the response.   D/W Mother bedside 06-11-2011, Med Rx OK for [place], DNR otherwise.    DVT Prophylaxis: Bilateral SCDs stop Lovenox.   See all Orders from today for further details     Leroy Sea M.D on 06/15/2011 at 1:14 PM  [facility] Office  [phone]

## 2011-06-15 NOTE — Progress Notes (Addendum)
Nutrition Follow-up  Diet Order:  Full liquids  Diet advanced to full liquids this afternoon.  RN just ordered full liquid tray for pt.  Pt requires thickened liquids.  Discussed with RN.  Pt has been very hungry, often states he is hungry. Pt has been taking [employer] as ordered (was NPO 1/17-1/18).  Meds: Scheduled Meds:   . antiseptic oral rinse  15 mL Mouth Rinse q12n4p  . benztropine  1 mg Oral BID  . bisacodyl  10 mg Rectal Once  . chlorhexidine  15 mL Mouth Rinse BID  . clonazePAM  0.5 mg Oral BID  . divalproex  500 mg Oral BID  . feeding supplement  1 Container Oral TID WC  . FLUoxetine  40 mg Oral Daily  . haloperidol  5 mg Oral BID  . levETIRAcetam  500 mg Oral BID  . olopatadine  1 drop Both Eyes BID  . pantoprazole (PROTONIX) IV  40 mg Intravenous Q12H  . polyethylene glycol  17 g Oral Daily  . QUEtiapine  400 mg Oral QHS  . sodium chloride  3 mL Intravenous Q12H  . Tamsulosin HCl  0.4 mg Oral Daily  . DISCONTD: Olopatadine HCl  1 drop Ophthalmic Daily  . DISCONTD: Valproic Acid  400 mg Oral BID   Continuous Infusions:   . sodium chloride 50 mL/hr at 06/15/11 0000   PRN Meds:.sodium chloride, acetaminophen, acetaminophen, food thickener, LORazepam, ondansetron, senna-docusate, sodium chloride  Labs:  CMP     Component Value Date/Time   NA 144 06/15/2011 0532   K 3.7 06/15/2011 0532   CL 107 06/15/2011 0532   CO2 28 06/15/2011 0532   GLUCOSE 86 06/15/2011 0532   BUN 5* 06/15/2011 0532   CREATININE 0.81 06/15/2011 0532   CALCIUM 8.9 06/15/2011 0532   PROT 6.2 12/11/2010 0331   ALBUMIN 2.7* 12/11/2010 0331   AST 28 12/11/2010 0331   ALT 29 12/11/2010 0331   ALKPHOS 106 12/11/2010 0331   BILITOT 0.3 12/11/2010 0331   GFRNONAA >90 06/15/2011 0532   GFRAA >90 06/15/2011 0532     Intake/Output Summary (Last 24 hours) at 06/15/11 1246 Last data filed at 06/15/11 0400  Gross per 24 hour  Intake  762.5 ml  Output      0 ml  Net  762.5 ml    Weight Status:   116 lbs (1/17)  Nutrition Dx:  Inadequate oral intake, ongoing  Intervention:   1.  Meals/snacks; trays to RN for thickening prior to consumption.   2.  Supplements; Magic cups with meals.  Ensure prn  Monitor:   1.  Food/Beverage; pt to continue to consume liquids prior to bowel prep.  Snacks as desired.   Hoyt Koch Pager #:  [phone]

## 2011-06-15 NOTE — Progress Notes (Signed)
Dr. Madilyn Fireman here to assess pt, states pt can have full liquid diet.

## 2011-06-15 NOTE — Progress Notes (Signed)
[facility] Progress Note  Subjective: Patient suite could not arouse Objective: Vital signs in last 24 hours: Temp:  [97.5 F (36.4 C)-98.6 F (37 C)] 98.6 F (37 C) (01/20 0550) Pulse Rate:  [73-93] 93  (01/20 0550) Resp:  [16-19] 19  (01/20 0550) BP: (93-95)/(57-61) 93/57 mmHg (01/20 0550) SpO2:  [95 %-97 %] 95 % (01/20 0550) Weight change:    PE: Not examined  Lab Results: Results for orders placed during the hospital encounter of 06/09/11 (from the past 24 hour(s))  PREALBUMIN     Status: Normal   Collection Time   06/14/11 10:40 AM      Component Value Range   Prealbumin 19.0  17.0 - 34.0 (mg/dL)  BASIC METABOLIC PANEL     Status: Abnormal   Collection Time   06/15/11  5:32 AM      Component Value Range   Sodium 144  135 - 145 (mEq/L)   Potassium 3.7  3.5 - 5.1 (mEq/L)   Chloride 107  96 - 112 (mEq/L)   CO2 28  19 - 32 (mEq/L)   Glucose, Bld 86  70 - 99 (mg/dL)   BUN 5 (*) 6 - 23 (mg/dL)   Creatinine, Ser 7.82  0.50 - 1.35 (mg/dL)   Calcium 8.9  8.4 - 95.6 (mg/dL)   GFR calc non Af Amer >90  >90 (mL/min)   GFR calc Af Amer >90  >90 (mL/min)  MAGNESIUM     Status: Normal   Collection Time   06/15/11  5:32 AM      Component Value Range   Magnesium 1.8  1.5 - 2.5 (mg/dL)    Studies/Results: No results found.    Assessment: Anemia  Plan: Continue laxatives. We'll begin full prep tomorrow for EGD and colonoscopy the day after tomorrow.    Kehinde Totzke C 06/15/2011, 9:23 AM

## 2011-06-15 NOTE — Progress Notes (Signed)
MEDICATION RELATED CONSULT NOTE - Follow-Up  Pharmacy Consult for Keppra/Depakote Indication: Seizure  Allergies  Allergen Reactions  . Pineapple Anaphylaxis    Patient Measurements: Height: 6' (182.9 cm) Weight: 116 lb 13.5 oz (53 kg) (BED SCALE) IBW/kg (Calculated) : 77.6   Labs:  Basename 06/15/11 0532 06/13/11 0445  WBC -- 5.8  HGB -- 9.8*  HCT -- 33.2*  PLT -- 344  APTT -- --  CREATININE 0.81 0.75  LABCREA -- --  CREATININE 0.81 0.75  CREAT24HRUR -- --  MG 1.8 --  PHOS -- --  ALBUMIN -- --  PROT -- --  ALBUMIN -- --  AST -- --  ALT -- --  ALKPHOS -- --  BILITOT -- --  BILIDIR -- --  IBILI -- --   Estimated Creatinine Clearance: 88.2 ml/min (by C-G formula based on Cr of 0.81).  Valproic Acid Level 38.9  Microbiology: Recent Results (from the past 720 hour(s))  MRSA PCR SCREENING     Status: Normal   Collection Time   06/10/11 12:50 PM      Component Value Range Status Comment   MRSA by PCR NEGATIVE  NEGATIVE  Final     Medical History: Past Medical History  Diagnosis Date  . 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 Depression   . 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 Closed head injury   . Seizures   . Peptic ulcer disease   . Esophageal ulcer   . Malnutrition   . Constipation   . BPH (benign prostatic hyperplasia)   . Smoker     Medications:  Scheduled:     . antiseptic oral rinse  15 mL Mouth Rinse q12n4p  . benztropine  1 mg Oral BID  . bisacodyl  10 mg Rectal Once  . chlorhexidine  15 mL Mouth Rinse BID  . clonazePAM  0.5 mg Oral BID  . feeding supplement  1 Container Oral TID WC  . FLUoxetine  40 mg Oral Daily  . haloperidol  5 mg Oral BID  . levETIRAcetam  500 mg Oral BID  . olopatadine  1 drop Both Eyes BID  . pantoprazole (PROTONIX) IV  40 mg Intravenous Q12H  . polyethylene glycol  17 g Oral Daily  . QUEtiapine  400 mg Oral QHS  . sodium chloride  3 mL Intravenous Q12H  .  Tamsulosin HCl  0.4 mg Oral Daily  . Valproic Acid  400 mg Oral BID  . DISCONTD: Olopatadine HCl  1 drop Ophthalmic Daily   Infusions:     . sodium chloride 50 mL/hr at 06/15/11 0000    Assessment: Currently on Valproic Acid 400mg  BID and Keppra 500mg  BID. No reported seizures this AM. Valproic Acid level of 38.9 slightly below goal  Goal of Therapy:  Seizure control, valproic acid level 50-100  Plan:  1. Change valproic acid to Depakote 500mg  PO BID. Recheck another level at steady state 2. Continue Keppra 500mg  PO BID   Hessie Knows, PharmD, BCPS 06/15/2011 11:42 AM [phone]

## 2011-06-16 MED ORDER — HALOPERIDOL 2 MG PO TABS
2.0000 mg | ORAL_TABLET | Freq: Two times a day (BID) | ORAL | Status: DC
  Administered 2011-06-16 – 2011-06-17 (×2): 2 mg via ORAL
  Filled 2011-06-16 (×3): qty 1

## 2011-06-16 MED ORDER — SODIUM CHLORIDE 0.9 % IV SOLN
INTRAVENOUS | Status: DC
  Administered 2011-06-16: 19:00:00 via INTRAVENOUS

## 2011-06-16 MED ORDER — PEG 3350-KCL-NA BICARB-NACL 420 G PO SOLR
4000.0000 mL | Freq: Once | ORAL | Status: AC
  Administered 2011-06-16: 4000 mL via ORAL

## 2011-06-16 MED ORDER — QUETIAPINE FUMARATE 100 MG PO TABS
100.0000 mg | ORAL_TABLET | Freq: Every day | ORAL | Status: DC
  Administered 2011-06-16: 100 mg via ORAL
  Filled 2011-06-16 (×2): qty 1

## 2011-06-16 MED ORDER — SODIUM CHLORIDE 0.9 % IV SOLN: 250.0000 mL | INTRAVENOUS | Status: DC | PRN

## 2011-06-16 NOTE — Progress Notes (Signed)
[name] ZOX:[id number] is a 46 y.o. male,  Outpatient Primary MD for the patient is [name], MD, MD  Chief Complaint  Patient presents with  . Anemia        Subjective:   [name] today has, does not answer questions, but nods heads and denies headache-chest or Abd pain.     Objective:   Filed Vitals:   06/15/11 1430 06/15/11 2056 06/16/11 0524 06/16/11 1319  BP: 90/54 94/64 93/67  103/70  Pulse: 80 64 53 83  Temp: 97.3 F (36.3 C) 97.4 F (36.3 C) 97.8 F (36.6 C) 97.4 F (36.3 C)  TempSrc: Oral Axillary Axillary Oral  Resp: 20 16 16 16   Height:      Weight:      SpO2: 96% 98% 99% 100%    Wt Readings from Last 3 Encounters:  06/12/11 53 kg (116 lb 13.5 oz)  06/12/11 53 kg (116 lb 13.5 oz)  06/12/11 53 kg (116 lb 13.5 oz)     Intake/Output Summary (Last 24 hours) at 06/16/11 1346 Last data filed at 06/16/11 1300  Gross per 24 hour  Intake   2250 ml  Output    400 ml  Net   1850 ml    Exam Awake will  follow commands [place].AT,PERRAL Supple Neck,No JVD, No cervical lymphadenopathy appriciated.  Symmetrical Chest wall movement, Good air movement bilaterally, CTAB RRR,No Gallops,Rubs or new Murmurs, No Parasternal Heave +ve B.Sounds, Abd Soft, Non tender, No organomegaly appriciated, No rebound -guarding or rigidity. No Cyanosis, Clubbing or edema, No new Rash or bruise   Data Review  CBC  Lab 06/13/11 0445 06/12/11 0300 06/11/11 0420 06/10/11 0640 06/09/11 1640  WBC 5.8 8.0 7.2 3.4* 3.9*  HGB 9.8* 9.6* 10.5* 10.1* 7.1*  HCT 33.2* 31.9* 34.9* 33.2* 25.2*  PLT 344 323 341 345 389  MCV 75.5* 75.1* 74.9* 75.1* 72.4*  MCH 22.3* 22.6* 22.5* 22.9* 20.4*  MCHC 29.5* 30.1 30.1 30.4 28.2*  RDW 19.2* 18.6* 18.3* 17.9* 17.5*  LYMPHSABS -- -- -- -- --  MONOABS -- -- -- -- --  EOSABS -- -- -- -- --  BASOSABS -- -- -- -- --  BANDABS -- -- -- -- --    Chemistries   Lab 06/15/11 0532 06/13/11 0445 06/12/11 0300 06/11/11 0420 06/10/11  0640  NA 144 138 142 141 141  K 3.7 4.3 4.3 3.4* 3.7  CL 107 105 109 101 104  CO2 28 25 25  15* 28  GLUCOSE 86 79 76 115* 79  BUN 5* 10 4* 10 12  CREATININE 0.81 0.75 0.64 0.96 0.88  CALCIUM 8.9 8.7 8.8 9.4 9.1  MG 1.8 -- -- -- --  AST -- -- -- -- --  ALT -- -- -- -- --  ALKPHOS -- -- -- -- --  BILITOT -- -- -- -- --   ------------------------------------------------------------------------------------------------------------------ estimated creatinine clearance is 88.2 ml/min (by C-G formula based on Cr of 0.81). ------------------------------------------------------------------------------------------------------------------ No results found for this basename: HGBA1C:2 in the last 72 hours ------------------------------------------------------------------------------------------------------------------ No results found for this basename: CHOL:2,HDL:2,LDLCALC:2,TRIG:2,CHOLHDL:2,LDLDIRECT:2 in the last 72 hours ------------------------------------------------------------------------------------------------------------------ No results found for this basename: TSH,T4TOTAL,FREET3,T3FREE,THYROIDAB in the last 72 hours ------------------------------------------------------------------------------------------------------------------ No results found for this basename: VITAMINB12:2,FOLATE:2,FERRITIN:2,TIBC:2,IRON:2,RETICCTPCT:2 in the last 72 hours  Coagulation profile No results found for this basename: INR:5,PROTIME:5 in the last 168 hours  No results found for this basename: DDIMER:2 in the last 72 hours  Cardiac Enzymes No results found for this basename: CK:3,CKMB:3,TROPONINI:3,MYOGLOBIN:3 in the last 168 hours ------------------------------------------------------------------------------------------------------------------  No components found with this basename: POCBNP:3  Micro Results Recent Results (from the past 240 hour(s))  MRSA PCR SCREENING     Status: Normal   Collection  Time   06/10/11 12:50 PM      Component Value Range Status Comment   MRSA by PCR NEGATIVE  NEGATIVE  Final     Radiology Reports Ct Head Wo Contrast  06/11/2011  *RADIOLOGY REPORT*  Clinical Data: Altered mental status; no longer verbally responsive.  Uncontrolled spasms.  CT HEAD WITHOUT CONTRAST  Technique:  Contiguous axial images were obtained from the base of the skull through the vertex without contrast.  Comparison: CT of the head performed 12/10/2010  Findings: There is no evidence of acute infarction, mass lesion, or intra- or extra-axial hemorrhage on CT.  Linear foci of increased attenuation along the high left frontal lobe are thought to reflect beam hardening artifact.  Additional beam hardening artifact is noted along the inferior frontal lobes bilaterally.  Marked cerebellar atrophy is noted.  Chronic encephalomalacia is noted at the right temporal lobe, and to a lesser extent at the left temporal lobe, reflecting remote infarct.  Chronic encephalomalacia is also noted bilaterally near the vertex, more prominent on the left.  Scattered subcortical white matter change likely reflects small vessel ischemic microangiopathy.  Prominence of the ventricles suggests mild cortical volume loss. The basal ganglia are unremarkable in appearance.  No mass effect or midline shift is seen.  There is no evidence of fracture; visualized osseous structures are unremarkable in appearance.  The orbits are within normal limits. The paranasal sinuses and mastoid air cells are well-aerated.  No significant soft tissue abnormalities are seen.  IMPRESSION:  1.  No definite acute intracranial pathology seen on CT. 2.  Marked cerebellar atrophy noted. 3.  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  Scattered small vessel ischemic microangiopathy.  Original Report Authenticated By: Tonia Ghent, M.D.    EEG IMPRESSION: This is an abnormal awake and drowsy EEG due to  presence of mild diffuse background slowing. The finding may be suggestive of mild  diffuse cerebral dysfunction. It may be due to toxic metabolic encephalopathy, neurodegenerative disorder and/or bihemispheric structural abnormality. Clinical correlation is suggested.    Scheduled Meds:    . antiseptic oral rinse  15 mL Mouth Rinse q12n4p  . benztropine  1 mg Oral BID  . bisacodyl  10 mg Rectal Once  . chlorhexidine  15 mL Mouth Rinse BID  . clonazePAM  0.5 mg Oral QHS  . divalproex  500 mg Oral BID  . feeding supplement  237 mL Oral BID  . FLUoxetine  40 mg Oral Daily  . haloperidol  5 mg Oral BID  . levETIRAcetam  500 mg Oral BID  . olopatadine  1 drop Both Eyes BID  . pantoprazole (PROTONIX) IV  40 mg Intravenous Q12H  . polyethylene glycol  17 g Oral Daily  . polyethylene glycol-electrolytes  4,000 mL Oral Once  . QUEtiapine  200 mg Oral QHS  . sodium chloride  3 mL Intravenous Q12H  . Tamsulosin HCl  0.4 mg Oral Daily   Continuous Infusions:    . sodium chloride 50 mL/hr at 06/15/11 0000   PRN Meds:.sodium chloride, acetaminophen, acetaminophen, feeding supplement, food thickener, ondansetron, senna-docusate, sodium chloride  Assessment & Plan   1. Iron deficiency anemia due to chronic blood loss/ history of distal esophageal ulcer/ family history of colon cancer: Status post blood transfusion 06-10-11, H&H stable,  no active GI bleeding, continue IV PPI , Dr. Rochele Raring [facility] gastroenterology) following, plans for endoscopy (upper and lower) now postponed for Monday due to poor bowel prep due to history of esophageal ulcer and family history of colon cancer. Gentle IVF as only on clears.   2. H/O SEIZURE, GRAND MAL, POST BRAIN INJURY, history of severe closed head injury: had seizure 06-10-11, was drowsy post event so was  unable to take PO Tegretol, Lamictal - D/W Dr Lyman Speller X2 , Iv Keppra and Depakote and then to PO of both Meds PRN Ativan. Stop home antiseizure meds, and outpt  Neuro follow up per Dr Lyman Speller.   3. DYSPHAGIA DUE TO BRAIN INJURY, EPISODIC SILENT ASPIRATION: diet with Thickned liquids with assistance and Asp precautions, cleared by speech.   4. GERD: Continue PPI switched to IV.   5. BENIGN PROSTATIC HYPERTROPHY, WITH URINARY OBSTRUCTION: Continue Flomax once taking PO.   6. Low K - replaced & stable   7. MILD PCM clinically - per dietician.   9. Somnolence - improved after reducing his benzodiazepine does and his Seroquel dose.    D/W Mother bedside 06-11-2011, Med Rx OK for [place], DNR otherwise.    DVT Prophylaxis: Bilateral SCDs stop Lovenox.   See all Orders from today for further details     Leroy Sea M.D on 06/16/2011 at 1:46 PM  [facility] Office  [phone]

## 2011-06-16 NOTE — Progress Notes (Signed)
Physical Therapy Treatment Patient Details Name: KOBY PICKUP MRN: [id number] DOB: July 22, 1965 Today's Date: 06/16/2011  Anemia 14:10 - 14:45 1 ta  1 gt  PT Assessment/Plan  PT - Assessment/Plan Comments on Treatment Session: Assisted pt OOB to BSC (void & BM). More alert and conversive. Words are mumbled but appears at prior level of cognition. Following directions. PT Plan: Discharge plan remains appropriate PT Frequency: Min 3X/week Follow Up Recommendations: No PT follow up Equipment Recommended: None recommended by PT PT Goals  Acute Rehab PT Goals PT Goal Formulation: With patient Pt will go Supine/Side to Sit: with supervision PT Goal: Supine/Side to Sit - Progress: Progressing toward goal Pt will Sit at [facility] of Bed: with min assist PT Goal: Sit at Edge Of Bed - Progress: Progressing toward goal Pt will go Sit to Supine/Side: with min assist PT Goal: Sit to Supine/Side - Progress: Progressing toward goal Pt will go Sit to Stand: with min assist PT Goal: Sit to Stand - Progress: Progressing toward goal Pt will go Stand to Sit: with min assist PT Goal: Stand to Sit - Progress: Progressing toward goal Pt will Transfer Bed to Chair/Chair to Bed: with min assist PT Transfer Goal: Bed to Chair/Chair to Bed - Progress: Progressing toward goal  PT Treatment Precautions/Restrictions  Precautions Precautions: Fall Required Braces or Orthoses: No Restrictions Weight Bearing Restrictions: No Mobility (including Balance) Bed Mobility Bed Mobility: Yes Supine to Sit: 4: Min assist Supine to Sit Details (indicate cue type and reason): increased time and repeat VC's to stay on task Transfers Transfers: Yes Sit to Stand: 1: +2 Total assist;From bed Sit to Stand Details (indicate cue type and reason): Total assist + 2 pt 75%   Pt prefers to pull self up vs push up from behind Stand to Sit: 3: Mod assist;To chair/3-in-1 Stand to Sit Details: VC's to reach back prior to sit Stand  Pivot Transfers: 1: +2 Total assist Stand Pivot Transfer Details (indicate cue type and reason):  (great difficulty pivoting feet safely) Ambulation/Gait Ambulation/Gait: Yes Ambulation/Gait Assistance: 1: +2 Total assist Ambulation/Gait Assistance Details (indicate cue type and reason): Total assist + 2 pt 35% using EVA Walker with 75% tactile cueing to increase posture and 75% assist maneuvering eva walker correctly and safely Ambulation Distance (Feet): 15 Feet Assistive device: Eva walker Gait Pattern: Lateral trunk lean to right;Ataxic;Antalgic Gait velocity: Poor forwarf flex posture  Stairs: No Wheelchair Mobility Wheelchair Mobility: No    Exercise    End of Session PT - End of Session Equipment Utilized During Treatment: Gait belt Activity Tolerance: Patient tolerated treatment well Patient left: in chair;with call bell in reach Nurse Communication: Mobility status for transfers;Mobility status for ambulation;Other (comment) (NT instructed on trans and amb status, pt requires + 2) General Behavior During Session: [facility] for tasks performed (more conversive, laughing, following instructions, speaks mu) Cognition: WFL for tasks performed (appears at prior level)  Felecia Shelling  PTA [facility]  Acute  Rehab Pager     [phone]

## 2011-06-16 NOTE — Progress Notes (Signed)
The patient is doing well today. We are going to set him up for EGD colonoscopy tomorrow.

## 2011-06-17 ENCOUNTER — Encounter (HOSPITAL_COMMUNITY): Payer: Self-pay | Admitting: *Deleted

## 2011-06-17 ENCOUNTER — Inpatient Hospital Stay (HOSPITAL_COMMUNITY): Payer: Medicare Other | Admitting: Anesthesiology

## 2011-06-17 ENCOUNTER — Encounter (HOSPITAL_COMMUNITY)
Admission: EM | Disposition: A | Discharge status: Home / Self Care | Payer: Self-pay | Source: Home / Self Care | Attending: Internal Medicine

## 2011-06-17 ENCOUNTER — Encounter (HOSPITAL_COMMUNITY): Payer: Self-pay | Admitting: Anesthesiology

## 2011-06-17 HISTORY — PX: ESOPHAGOGASTRODUODENOSCOPY: SHX5428

## 2011-06-17 LAB — CBC
HCT: 33.3 % — ABNORMAL LOW (ref 39.0–52.0)
Hemoglobin: 9.6 g/dL — ABNORMAL LOW (ref 13.0–17.0)
MCHC: 28.8 g/dL — ABNORMAL LOW (ref 30.0–36.0)
MCV: 77.4 fL — ABNORMAL LOW (ref 78.0–100.0)

## 2011-06-17 LAB — BASIC METABOLIC PANEL
CO2: 29 mEq/L (ref 19–32)
Calcium: 9 mg/dL (ref 8.4–10.5)
Chloride: 104 mEq/L (ref 96–112)
Creatinine, Ser: 0.68 mg/dL (ref 0.50–1.35)
Glucose, Bld: 85 mg/dL (ref 70–99)

## 2011-06-17 LAB — MAGNESIUM: Magnesium: 2.1 mg/dL (ref 1.5–2.5)

## 2011-06-17 SURGERY — EGD (ESOPHAGOGASTRODUODENOSCOPY)
Anesthesia: Monitor Anesthesia Care

## 2011-06-17 MED ORDER — PANTOPRAZOLE SODIUM 40 MG PO TBEC: 40.0000 mg | DELAYED_RELEASE_TABLET | Freq: Two times a day (BID) | ORAL | Status: DC

## 2011-06-17 MED ORDER — DIVALPROEX SODIUM 500 MG PO DR TAB: 500.0000 mg | DELAYED_RELEASE_TABLET | Freq: Two times a day (BID) | ORAL | Status: DC

## 2011-06-17 MED ORDER — LACTATED RINGERS IV SOLN: INTRAVENOUS | Status: DC

## 2011-06-17 MED ORDER — PROMETHAZINE HCL 25 MG/ML IJ SOLN: 6.2500 mg | INTRAMUSCULAR | Status: DC | PRN

## 2011-06-17 MED ORDER — HALOPERIDOL 5 MG PO TABS: 5.0000 mg | ORAL_TABLET | Freq: Two times a day (BID) | ORAL | Status: DC | PRN

## 2011-06-17 MED ORDER — QUETIAPINE FUMARATE 100 MG PO TABS: 100.0000 mg | ORAL_TABLET | Freq: Every day | ORAL | Status: DC

## 2011-06-17 MED ORDER — FENTANYL CITRATE 0.05 MG/ML IJ SOLN: 25.0000 ug | INTRAMUSCULAR | Status: DC | PRN

## 2011-06-17 MED ORDER — FENTANYL CITRATE 0.05 MG/ML IJ SOLN
INTRAMUSCULAR | Status: DC | PRN
  Administered 2011-06-17: 50 ug via INTRAVENOUS

## 2011-06-17 MED ORDER — KETAMINE HCL 10 MG/ML IJ SOLN
INTRAMUSCULAR | Status: DC | PRN
  Administered 2011-06-17: 10 mg via INTRAVENOUS

## 2011-06-17 MED ORDER — LACTATED RINGERS IV SOLN
INTRAVENOUS | Status: DC | PRN
  Administered 2011-06-17: 11:00:00 via INTRAVENOUS

## 2011-06-17 MED ORDER — CLONAZEPAM 0.5 MG PO TABS: 0.5000 mg | ORAL_TABLET | Freq: Every evening | ORAL | Status: DC | PRN

## 2011-06-17 MED ORDER — MIDAZOLAM HCL 5 MG/5ML IJ SOLN
INTRAMUSCULAR | Status: DC | PRN
  Administered 2011-06-17: 1 mg via INTRAVENOUS

## 2011-06-17 MED ORDER — PROPOFOL 10 MG/ML IV EMUL
INTRAVENOUS | Status: DC | PRN
  Administered 2011-06-17: 100 ug/kg/min via INTRAVENOUS

## 2011-06-17 MED ORDER — LEVETIRACETAM 500 MG PO TABS: 500.0000 mg | ORAL_TABLET | Freq: Two times a day (BID) | ORAL | Status: DC

## 2011-06-17 NOTE — Progress Notes (Signed)
   CARE MANAGEMENT NOTE 06/17/2011  Patient:  [name], [name]   Account Number:  [id number]  Date Initiated:  06/17/2011  Documentation initiated by:  Raiford Noble  Subjective/Objective Assessment:   PT ADM WITH ANEMIA- HAS MR, SEIZURES, TBI     Action/Plan:   FROM GROUP HOME WITH CARETAKERS   Anticipated DC Date:  06/17/2011   Anticipated DC Plan:  GROUP HOME  In-house referral  Clinical Social Worker      DC Planning Services  NA      [facility] Choice  NA   Choice offered to / List presented to:  NA   DME arranged  NA      DME agency  NA     HH arranged  NA      HH agency  NA   Status of service:  In process, will continue to follow Medicare Important Message given?  NA - LOS <3 / Initial given by admissions (If response is "NO", the following Medicare IM given date fields will be blank) Date Medicare IM given:   Date Additional Medicare IM given:    Discharge Disposition:  GROUP HOME  Per UR Regulation:  Reviewed for med. necessity/level of care/duration of stay  Comments:  06-17-11 [name] [phone] Pt to return to group home upon discharge. Csw aware.

## 2011-06-17 NOTE — Anesthesia Postprocedure Evaluation (Signed)
Anesthesia Post Note  Patient: [name]  Procedure(s) Performed:  ESOPHAGOGASTRODUODENOSCOPY (EGD)  Anesthesia type: MAC  Patient location: PACU  Post pain: Pain level controlled  Post assessment: Post-op Vital signs reviewed  Last Vitals:  Filed Vitals:   06/17/11 1155  BP: 100/67  Pulse:   Temp:   Resp: 10    Post vital signs: Reviewed  Level of consciousness: sedated  Complications: No apparent anesthesia complications

## 2011-06-17 NOTE — Progress Notes (Signed)
OT Cancellation Note  ___Treatment cancelled today due to medical issues with patient which prohibited therapy  _x__ Treatment cancelled today due to patient receiving procedure or test. Per nursing, pt leaving for colonoscopy soon. Requested pt remain in bed. Will check back as schedule permits.  ___ Treatment cancelled today due to patient's refusal to participate   ___ Treatment cancelled today due to   Signature: Garrel Ridgel, OTR/L  Pager [phone] 06/17/2011

## 2011-06-17 NOTE — Anesthesia Postprocedure Evaluation (Signed)
Anesthesia Post Note  Patient: [name]  Procedure(s) Performed:  ESOPHAGOGASTRODUODENOSCOPY (EGD)  Anesthesia type: MAC  Patient location: PACU  Post pain: Pain level controlled  Post assessment: Post-op Vital signs reviewed  Last Vitals:  Filed Vitals:   06/17/11 1250  BP: 117/72  Pulse:   Temp:   Resp: 19    Post vital signs: Reviewed  Level of consciousness: sedated  Complications: No apparent anesthesia complications

## 2011-06-17 NOTE — Anesthesia Preprocedure Evaluation (Signed)
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    GI/Hepatic Neg liver ROS, PUD, GERD-  ,  Endo/Other  Negative Endocrine ROS  Renal/GU negative Renal ROS Bladder dysfunction      Musculoskeletal negative musculoskeletal ROS (+)   Abdominal   Peds  Hematology negative hematology ROS (+)   Anesthesia Other Findings   Reproductive/Obstetrics negative OB ROS                           Anesthesia Physical Anesthesia Plan  ASA: III  Anesthesia Plan: MAC   Post-op Pain Management:    Induction: Intravenous  Airway Management Planned: Simple Face Mask and Nasal Cannula  Additional Equipment:   Intra-op Plan:   Post-operative Plan:   Informed Consent: I have reviewed the patients History and Physical, chart, labs and discussed the procedure including the risks, benefits and alternatives for the proposed anesthesia with the patient or authorized representative who has indicated his/her understanding and acceptance.   Dental advisory given  Plan Discussed with: CRNA  Anesthesia Plan Comments:         Anesthesia Quick Evaluation

## 2011-06-17 NOTE — Progress Notes (Signed)
UR completed 

## 2011-06-17 NOTE — Transfer of Care (Signed)
Immediate Anesthesia Transfer of Care Note  Patient: [name]  Procedure(s) Performed:  ESOPHAGOGASTRODUODENOSCOPY (EGD)  Patient Location: Endoscopy Unit  Anesthesia Type: MAC  Level of Consciousness: sedated  Airway & Oxygen Therapy: Patient Spontanous Breathing and Patient connected to nasal cannula oxygen  Post-op Assessment: Report given to PACU RN and Post -op Vital signs reviewed and stable  Post vital signs: Reviewed and stable  Complications: No apparent anesthesia complications

## 2011-06-17 NOTE — Progress Notes (Signed)
Subjective: The patient is down in the endoscopy unit at this time for his EGD and colonoscopy.  Objective: He is seen and reexamined by me today.                    Vital signs are stable, he is in no distress, heart regular rhythm, lungs clear, abdomen is soft nontender  Assessment: Anemia,  Plan: He is stable to proceed with endoscopic evaluation of both the upper and lower gastrointestinal tracts

## 2011-06-17 NOTE — Discharge Summary (Addendum)
[name], 46 y.o., DOB [birth year], MRN [id number]. 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 Depression   . 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 Closed head injury   . Seizures   . Peptic ulcer disease   . Esophageal ulcer   . Malnutrition   . Constipation   . BPH (benign prostatic hyperplasia)   . Smoker     Past Surgical History  Procedure Date  . Gastrectomy      Hospital Course See H&P, Labs, Consult and Test reports for all details in brief, patient was admitted for  anemia which was found on his blood work at his group home.  1. Iron deficiency anemia due to chronic blood loss/ history of distal esophageal ulcer/ family history of colon cancer: Status post blood transfusion 06-10-11, H&H stable, no active GI bleeding, continue PO PPI , needs to follow with Dr. Rochele Raring of [facility] gastroenterology patient eventually may need a colonoscopy, anoscopy this admission could not be done as patient failed to have a good bowel prep despite several days of laxative therapy, his EGD today showed medium-sized heart hernia with distal erosive ulcerative esophagitis. He will need to see him to [facility]  gastroenterology physician Dr. Rochele Raring in 1-2 weeks.    2. H/O SEIZURE, GRAND MAL, POST BRAIN INJURY, history of severe closed head injury: had seizure 06-10-11 despite being on his home medications, case was discussed with D/W Dr Lyman Speller neurologist X 2 , who recommended to continue oral doses of Keppra and Depakote which have controlled his seizures, patient should follow with his neurologist in one to 2 weeks.   3. DYSPHAGIA DUE TO BRAIN INJURY, EPISODIC SILENT ASPIRATION: diet dysphagia 2 & Thickned liquids with assistance and Asp precautions, please have your speech therapy see the patient and advance diet further as tolerated , full aspiration precautions, diet with full assistance, keeping the head of the bed at 90, insuring that patient is tolerating the diet well.    4. GERD: Continue PPI switched to oral treatment.   5. BENIGN PROSTATIC HYPERTROPHY, WITH URINARY OBSTRUCTION: Continue Flomax.   6. Low K - replaced & stable    7. Somnolence - much improved after reducing his benzodiazepine does and his Seroquel dose.    Patient's mother was updated bedside she was agreeable with the plan.    Consults  GI-Dr Evette Cristal  EGD FINDINGS  Esophagus distal erosive ulcerative esophagitis  Stomach: Small to medium-sized hiatal hernia  EEG   This is an abnormal awake and drowsy EEG due to presence of mild diffuse background slowing. The finding may be suggestive of mild diffuse cerebral dysfunction. It may be due to toxic metabolic encephalopathy, neurodegenerative disorder and/or bihemispheric structural abnormality. Clinical correlation is suggested.  Ct Head  Wo Contrast  06/11/2011  *RADIOLOGY REPORT*  Clinical Data: Altered mental status; no longer verbally responsive.  Uncontrolled spasms.  CT HEAD WITHOUT CONTRAST  Technique:  Contiguous axial images were obtained from the base of the skull through the vertex without contrast.  Comparison: CT of the head performed 12/10/2010   Findings: There is no evidence of acute infarction, mass lesion, or intra- or extra-axial hemorrhage on CT.  Linear foci of increased attenuation along the high left frontal lobe are thought to reflect beam hardening artifact.  Additional beam hardening artifact is noted along the inferior frontal lobes bilaterally.  Marked cerebellar atrophy is noted.  Chronic encephalomalacia is noted at the right temporal lobe, and to a lesser extent at the left temporal lobe, reflecting remote infarct.  Chronic encephalomalacia is also noted bilaterally near the vertex, more prominent on the left.  Scattered subcortical white matter change likely reflects small vessel ischemic microangiopathy.  Prominence of the ventricles suggests mild cortical volume loss. The basal ganglia are unremarkable in appearance.  No mass effect or midline shift is seen.  There is no evidence of fracture; visualized osseous structures are unremarkable in appearance.  The orbits are within normal limits. The paranasal sinuses and mastoid air cells are well-aerated.  No significant soft tissue abnormalities are seen.  IMPRESSION:  1.  No definite acute intracranial pathology seen on CT. 2.  Marked cerebellar atrophy noted. 3.  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  Scattered small vessel ischemic microangiopathy.  Original Report Authenticated By: Tonia Ghent, M.D.     Today   Subjective:   [name] today has no headache,no chest abdominal pain,no new weakness tingling or numbness.   Objective:   Blood pressure 117/72, pulse 80, temperature 97.4 F (36.3 C), temperature source Oral, resp. rate 19, height 6' (1.829 m), weight 53 kg (116 lb 13.5 oz), SpO2 97.00%.  Intake/Output Summary (Last 24 hours) at 06/17/11 1357 Last data filed at 06/17/11 1153  Gross per 24 hour  Intake 2558.33 ml  Output   1575 ml  Net 983.33 ml    Exam Awake Alert,  No new F.N deficits, Normal  affect [place].AT,PERRAL Supple Neck,No JVD, No cervical lymphadenopathy appriciated.  Symmetrical Chest wall movement, Good air movement bilaterally, CTAB RRR,No Gallops,Rubs or new Murmurs, No Parasternal Heave +ve B.Sounds, Abd Soft, Non tender, No organomegaly appriciated, No rebound -guarding or rigidity. No Cyanosis, Clubbing or edema, No new Rash or bruise  Data Review      CBC w Diff: Lab Results  Component Value Date   WBC 4.1 06/17/2011   HGB 9.6* 06/17/2011   HCT 33.3* 06/17/2011   PLT 305 06/17/2011   LYMPHOPCT 22 12/13/2010   MONOPCT 16* 12/13/2010   EOSPCT 3 12/13/2010   BASOPCT 0 12/13/2010   CMP: Lab Results  Component Value Date   NA 139 06/17/2011   K 4.2 06/17/2011   CL 104 06/17/2011   CO2 29 06/17/2011   BUN 6 06/17/2011   CREATININE 0.68 06/17/2011   PROT 6.2 12/11/2010   ALBUMIN 2.7* 12/11/2010   BILITOT 0.3 12/11/2010   ALKPHOS 106 12/11/2010   AST 28 12/11/2010   ALT 29 12/11/2010  .  Micro Results Recent Results (from the past 240 hour(s))  MRSA PCR SCREENING     Status: Normal   Collection Time   06/10/11 12:50 PM      Component Value Range Status Comment   MRSA by PCR NEGATIVE  NEGATIVE  Final      Discharge Instructions     Follow with Primary MD [name], MD, MD in 7 days   Get CBC, CMP, checked 7 days by Primary MD and again as instructed by your Primary MD. Get a 2 view Chest X ray done.  Get Medicines reviewed and adjusted.  Please request your Prim.MD to go over all Hospital Tests and Procedure/Radiological results at the follow up, please get all Hospital records sent to your Prim MD by signing hospital release before you go home.  Activity: Fall precautions use walker/cane & assistance as needed  Diet: Dysphagia 2,  Nectar thick with Aspiration precautions, please have speech evaluate and recommend diet.  For Heart failure patients - Check your Weight same time everyday, if you gain over 2 pounds, or you develop in leg swelling,  experience more shortness of breath or chest pain, call your Primary MD immediately. Follow Cardiac Low Salt Diet and 1.8 lit/day fluid restriction.  Disposition Home  If you experience worsening of your admission symptoms, develop shortness of breath, life threatening emergency, suicidal or homicidal thoughts you must seek medical attention immediately by calling 911 or calling your MD immediately  if symptoms less severe.  You Must read complete instructions/literature along with all the possible adverse reactions/side effects for all the Medicines you take and that have been prescribed to you. Take any new Medicines after you have completely understood and accpet all the possible adverse reactions/side effects.       Follow-up Information    Follow up with [facility], MD. Call in 2 days.      Follow up with Graylin Shiver, MD. Call in 1 week.   Contact information:   [address], [address] [place] [address] [phone]          Discharge Medications    [place], [place]  Home Medication Instructions [id number]   Printed on:06/17/11 1619  Medication Information                    benztropine (COGENTIN) 1 MG tablet Take 1 mg by mouth 2 (two) times daily.             calcium citrate-vitamin D (CITRACAL+D) 315-200 MG-UNIT per tablet Take 1 tablet by mouth 2 (two) times daily.             FLUoxetine (PROZAC) 40 MG capsule Take 40 mg by mouth daily.             Tamsulosin HCl (FLOMAX) 0.4 MG CAPS Take 0.4 mg by mouth daily.            Olopatadine HCl (PATADAY) 0.2 % SOLN Apply 1 drop to eye daily as needed. Red eyes           clonazePAM (KLONOPIN) 0.5 MG tablet Take 1 tablet (0.5 mg total) by mouth at bedtime as needed for anxiety.           haloperidol (HALDOL) 5 MG tablet Take 1 tablet (5 mg total) by mouth 2 (two) times daily as needed.           divalproex (DEPAKOTE) 500 MG DR tablet Take 1 tablet (500 mg total) by mouth 2 (two) times  daily.           levETIRAcetam (KEPPRA) 500 MG tablet Take 1 tablet (500 mg total) by mouth 2 (two) times daily.           pantoprazole (PROTONIX) 40 MG tablet Take  1 tablet (40 mg total) by mouth 2 (two) times daily.           QUEtiapine (SEROQUEL) 100 MG tablet Take 1 tablet (100 mg total) by mouth at bedtime.             Total Time in preparing paper work, data evaluation and todays exam - 35 minutes  Leroy Sea M.D on 06/17/2011 at 1:57 PM  [facility] Office  [phone]

## 2011-06-17 NOTE — Op Note (Signed)
[facility] [address] [place], [place]  [address]  ENDOSCOPY PROCEDURE REPORT  PATIENT:  [name], [name]  MR#:  [id number] BIRTHDATE:  1965-09-24, 45 yrs. old  GENDER:  male  ENDOSCOPIST:  Wandalee Ferdinand, MD Referred by:  PROCEDURE DATE:  06/17/2011 PROCEDURE: Esophagogastroduodenoscopy ASA CLASS: 2 INDICATIONS: Anemia  MEDICATIONS: Propofol per anesthesia  TOPICAL ANESTHETIC:  DESCRIPTION OF PROCEDURE:   After the risks benefits and alternatives of the procedure were thoroughly explained, informed consent was obtained.  The Pentax Gastroscope [id number] endoscope was introduced through the mouth and advanced to the , without limitations second portion of the duodenum.  The instrument was slowly withdrawn as the mucosa was fully examined. <<PROCEDUREIMAGES>>  FINDINGS: Esophagus distal erosive ulcerative esophagitis  Stomach: Small to medium-sized hiatal hernia  Duodenum: Normal  COMPLICATIONS: None  ENDOSCOPIC IMPRESSION: See above  RECOMMENDATIONS: Continue supportive care, a colonoscopy was going to be done today however it was canceled because it was noted that the patient was still passing thick muddy-appearing brown stool  REPEAT EXAM:  When necessary  ______________________________ Wandalee Ferdinand, MD  CC:  n. eSIGNEDWandalee Ferdinand at 06/17/2011 11:51 AM  [name], [phone]

## 2011-06-18 ENCOUNTER — Encounter (HOSPITAL_COMMUNITY): Payer: Self-pay | Admitting: Gastroenterology

## 2011-08-27 ENCOUNTER — Encounter (HOSPITAL_COMMUNITY): Payer: Self-pay | Admitting: *Deleted

## 2011-08-27 ENCOUNTER — Ambulatory Visit (HOSPITAL_COMMUNITY)
Admission: RE | Admit: 2011-08-27 | Discharge: 2011-08-27 | Disposition: A | Discharge status: Home / Self Care | Payer: Medicare Other | Source: Ambulatory Visit | Attending: Gastroenterology | Admitting: Gastroenterology

## 2011-08-27 ENCOUNTER — Ambulatory Visit (HOSPITAL_COMMUNITY): Payer: Medicare Other | Admitting: *Deleted

## 2011-08-27 ENCOUNTER — Encounter (HOSPITAL_COMMUNITY)
Admission: RE | Disposition: A | Discharge status: Home / Self Care | Payer: Self-pay | Source: Ambulatory Visit | Attending: Gastroenterology

## 2011-08-27 ENCOUNTER — Ambulatory Visit (HOSPITAL_COMMUNITY): Admit: 2011-08-27 | Payer: Self-pay | Admitting: Gastroenterology

## 2011-08-27 ENCOUNTER — Encounter (HOSPITAL_COMMUNITY): Payer: Self-pay | Admitting: Gastroenterology

## 2011-08-27 DIAGNOSIS — K449 Diaphragmatic hernia without obstruction or gangrene: Secondary | ICD-10-CM | POA: Insufficient documentation

## 2011-08-27 DIAGNOSIS — K208 Other esophagitis without bleeding: Secondary | ICD-10-CM | POA: Insufficient documentation

## 2011-08-27 DIAGNOSIS — D509 Iron deficiency anemia, unspecified: Secondary | ICD-10-CM | POA: Insufficient documentation

## 2011-08-27 SURGERY — ESOPHAGOGASTRODUODENOSCOPY (EGD) WITH PROPOFOL
Anesthesia: Monitor Anesthesia Care

## 2011-08-27 MED ORDER — MEPERIDINE HCL 100 MG/ML IJ SOLN: 6.2500 mg | INTRAMUSCULAR | Status: DC | PRN

## 2011-08-27 MED ORDER — LACTATED RINGERS IV SOLN: INTRAVENOUS | Status: DC

## 2011-08-27 MED ORDER — PANTOPRAZOLE SODIUM 40 MG PO TBEC: 40.0000 mg | DELAYED_RELEASE_TABLET | Freq: Every day | ORAL | Status: DC

## 2011-08-27 MED ORDER — MIDAZOLAM HCL 5 MG/5ML IJ SOLN
INTRAMUSCULAR | Status: DC | PRN
  Administered 2011-08-27 (×2): 1 mg via INTRAVENOUS

## 2011-08-27 MED ORDER — FENTANYL CITRATE 0.05 MG/ML IJ SOLN
INTRAMUSCULAR | Status: DC | PRN
  Administered 2011-08-27 (×2): 50 ug via INTRAVENOUS

## 2011-08-27 MED ORDER — PROMETHAZINE HCL 25 MG/ML IJ SOLN: 6.2500 mg | INTRAMUSCULAR | Status: DC | PRN

## 2011-08-27 MED ORDER — LACTATED RINGERS IV SOLN
INTRAVENOUS | Status: DC | PRN
  Administered 2011-08-27: 09:00:00 via INTRAVENOUS

## 2011-08-27 MED ORDER — BUTAMBEN-TETRACAINE-BENZOCAINE 2-2-14 % EX AERO
INHALATION_SPRAY | CUTANEOUS | Status: DC | PRN
  Administered 2011-08-27: 1 via TOPICAL

## 2011-08-27 MED ORDER — PROPOFOL 10 MG/ML IV EMUL
INTRAVENOUS | Status: DC | PRN
  Administered 2011-08-27: 100 ug/kg/min via INTRAVENOUS

## 2011-08-27 SURGICAL SUPPLY — 24 items

## 2011-08-27 NOTE — Progress Notes (Signed)
Unable to determine what medication he took, and NPO status , how much prep he took  D/t cognition. 

## 2011-08-27 NOTE — Anesthesia Preprocedure Evaluation (Signed)
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    GI/Hepatic Neg liver ROS, PUD, GERD-  ,  Endo/Other  negative endocrine ROS  Renal/GU negative Renal ROS Bladder dysfunction      Musculoskeletal negative musculoskeletal ROS (+)   Abdominal   Peds  Hematology negative hematology ROS (+)   Anesthesia Other Findings   Reproductive/Obstetrics negative OB ROS                           Anesthesia Physical  Anesthesia Plan  ASA: III  Anesthesia Plan: MAC   Post-op Pain Management:    Induction: Intravenous  Airway Management Planned: Simple Face Mask and Nasal Cannula  Additional Equipment:   Intra-op Plan:   Post-operative Plan:   Informed Consent: I have reviewed the patients History and Physical, chart, labs and discussed the procedure including the risks, benefits and alternatives for the proposed anesthesia with the patient or authorized representative who has indicated his/her understanding and acceptance.   Dental advisory given  Plan Discussed with: CRNA  Anesthesia Plan Comments:         Anesthesia Quick Evaluation

## 2011-08-27 NOTE — H&P (View-Only) (Signed)
Unable to determine what medication he took, and NPO status , how much prep he took  D/t cognition.

## 2011-08-27 NOTE — H&P (Signed)
  Date of Initial H&P: 08/05/11  History reviewed, patient examined, no change in status, stable for surgery. 

## 2011-08-27 NOTE — Transfer of Care (Signed)
Immediate Anesthesia Transfer of Care Note  Patient: [name]  Procedure(s) Performed: Procedure(s) (LRB): ESOPHAGOGASTRODUODENOSCOPY (EGD) WITH PROPOFOL (N/A) COLONOSCOPY WITH PROPOFOL (N/A)  Patient Location: PACU  Anesthesia Type: MAC  Level of Consciousness: awake and alert   Airway & Oxygen Therapy: Patient Spontanous Breathing and Patient connected to nasal cannula oxygen  Post-op Assessment: Report given to PACU RN and Post -op Vital signs reviewed and stable  Post vital signs: Reviewed and stable  Complications: No apparent anesthesia complications

## 2011-08-27 NOTE — Anesthesia Postprocedure Evaluation (Signed)
  Anesthesia Post-op Note  Patient: [name]  Procedure(s) Performed: Procedure(s) (LRB): ESOPHAGOGASTRODUODENOSCOPY (EGD) WITH PROPOFOL (N/A) COLONOSCOPY WITH PROPOFOL (N/A)  Patient Location: PACU  Anesthesia Type: MAC  Level of Consciousness: awake and alert   Airway and Oxygen Therapy: Patient Spontanous Breathing  Post-op Pain: mild  Post-op Assessment: Post-op Vital signs reviewed, Patient's Cardiovascular Status Stable, Respiratory Function Stable, Patent Airway and No signs of Nausea or vomiting  Post-op Vital Signs: stable  Complications: No apparent anesthesia complications

## 2011-08-27 NOTE — Op Note (Signed)
[facility] [address] [place], [place]  [address]  COLONOSCOPY PROCEDURE REPORT  PATIENT:  Bruce, Stephens  MR#:  [id number] BIRTHDATE:  08/24/1965, 46 yrs. old  GENDER:  male ENDOSCOPIST:  Charlott Rakes, MD REF. BY:  Maryelizabeth Rowan, M.D. PROCEDURE DATE:  08/27/2011 PROCEDURE:  Colonoscopy 19147 ASA CLASS:  Class III INDICATIONS:  Iron deficiency anemia MEDICATIONS:   See Anesthesia Report.  DESCRIPTION OF PROCEDURE:   After the risks benefits and alternatives of the procedure were thoroughly explained, informed consent was obtained.  The Pentax Ped Colon [id number] endoscope was introduced through the anus and advanced to the cecum, which was identified by both the appendix and ileocecal valve, without limitations.  The quality of the prep was good..  The instrument was then slowly withdrawn as the colon was fully examined. <<PROCEDUREIMAGES>>  FINDINGS:  Rectal exam unremarkable.  Pediatric colonoscope inserted into the colon and advanced to the cecum, where the appendiceal orifice and ileocecal valve were identified.    On careful withdrawal of the colonoscope no mucosal abnormalities were noted.  Retroflexion revealed small internal hemorrhoids.  COMPLICATIONS:  None  IMPRESSION:    1. Small internal hemorrhoids otherwise normal colonoscopy  RECOMMENDATIONS:   1. Proceed with EGD 2. Repeat colonoscopy in 10 years  ______________________________ Charlott Rakes, MD  CC:  Maryelizabeth Rowan, MD  n. Rosalie DoctorCharlott Rakes at 08/27/2011 09:59 AM  [name], [phone]

## 2011-08-27 NOTE — Brief Op Note (Signed)
See endopro notes

## 2011-08-27 NOTE — Op Note (Signed)
[facility] [address] [place], [place]  [address]  ENDOSCOPY PROCEDURE REPORT  PATIENT:  [name], [name]  MR#:  [id number] BIRTHDATE:  11/07/1965, 46 yrs. old  GENDER:  male  ENDOSCOPIST:  Charlott Rakes, MD Referred by:  Maryelizabeth Rowan, M.D.  PROCEDURE DATE:  08/27/2011 PROCEDURE:  EGD with biopsy, 43239 ASA CLASS:  Class III INDICATIONS:  iron deficiency anemia  MEDICATIONS:   See Anesthesia Report.  TOPICAL ANESTHETIC:  DESCRIPTION OF PROCEDURE:   After the risks benefits and alternatives of the procedure were thoroughly explained, informed consent was obtained.  The [id number] ([id number]) endoscope was introduced through the mouth and advanced to the second portion of the duodenum, without limitations.  The instrument was slowly withdrawn as the mucosa was fully examined. <<PROCEDUREIMAGES>>  FINDINGS:  The endoscope was inserted into the oropharynx and esophagus was intubated.  The gastroesophageal junction was noted to be 36 cm from the incisors.  Endoscope was advanced into the stomach, which revealed normal appearing gastric mucosa.  The endoscope was advanced to the duodenal bulb and second portion of duodenum which were unremarkable.  Biopsies were taken of the second portion of the duodenum and duodenal bulb to check for celiac sprue. The endoscope was withdrawn back into the stomach and retroflexion revealed a small to medium-sized hiatal hernia. On withdrawal of the endoscope into the distal esophagus there was a focal area of edema and superficial ulceration at the GE junction consistent with mild ulcerative esophagitis. The esophagus was otherwise unremarkable.  COMPLICATIONS:  None  ENDOSCOPIC IMPRESSION:  1. Mild ulcerative esophagitis 2. Small to medium hiatal hernia  RECOMMENDATIONS:    1. Continue PPI QD 2. F/U on path 3. Resume previous diet  REPEAT EXAM:  N/A  ______________________________ Charlott Rakes, MD  CC:  Maryelizabeth Rowan, MD  n. Rosalie DoctorCharlott Rakes at 08/27/2011 10:07 AM  [name], [phone]

## 2011-08-27 NOTE — Discharge Instructions (Signed)
Resume previous diet. Will contact you (guardian) when results are back.

## 2011-08-27 NOTE — Interval H&P Note (Signed)
History and Physical Interval Note:  08/27/2011 9:18 AM  [name]  has presented today for surgery, with the diagnosis of anemia  The various methods of treatment have been discussed with the patient and family. After consideration of risks, benefits and other options for treatment, the patient has consented to  Procedure(s) (LRB): ESOPHAGOGASTRODUODENOSCOPY (EGD) WITH PROPOFOL (N/A) COLONOSCOPY WITH PROPOFOL (N/A) as a surgical intervention .  The patients' history has been reviewed, patient examined, no change in status, stable for surgery.  I have reviewed the patients' chart and labs.  Questions were answered to the patient's satisfaction.     Lynleigh Kovack C.

## 2012-08-23 ENCOUNTER — Ambulatory Visit: Payer: Medicare Other | Attending: Nurse Practitioner | Admitting: Rehabilitative and Restorative Service Providers"

## 2012-08-23 DIAGNOSIS — M6281 Muscle weakness (generalized): Secondary | ICD-10-CM | POA: Insufficient documentation

## 2012-08-23 DIAGNOSIS — IMO0001 Reserved for inherently not codable concepts without codable children: Secondary | ICD-10-CM | POA: Insufficient documentation

## 2012-10-01 ENCOUNTER — Other Ambulatory Visit: Payer: Self-pay

## 2012-10-01 MED ORDER — LAMOTRIGINE ER 200 MG PO TB24: 200.0000 mg | ORAL_TABLET | Freq: Every evening | ORAL | Status: DC

## 2012-10-01 MED ORDER — LAMOTRIGINE ER 100 MG PO TB24: 100.0000 mg | ORAL_TABLET | Freq: Every morning | ORAL | Status: DC

## 2012-10-01 MED ORDER — CARBAMAZEPINE ER 300 MG PO CP12: 300.0000 mg | ORAL_CAPSULE | Freq: Two times a day (BID) | ORAL | Status: DC

## 2012-10-01 MED ORDER — CLONAZEPAM 0.5 MG PO TABS: 0.5000 mg | ORAL_TABLET | Freq: Two times a day (BID) | ORAL | Status: DC | PRN

## 2013-02-15 ENCOUNTER — Encounter: Payer: Self-pay | Admitting: Nurse Practitioner

## 2013-02-15 ENCOUNTER — Ambulatory Visit (INDEPENDENT_AMBULATORY_CARE_PROVIDER_SITE_OTHER): Payer: Medicare Other | Admitting: Nurse Practitioner

## 2013-02-15 VITALS — BP 101/65 | HR 93

## 2013-02-15 DIAGNOSIS — Z79899 Other long term (current) drug therapy: Secondary | ICD-10-CM

## 2013-02-15 DIAGNOSIS — G8110 Spastic hemiplegia affecting unspecified side: Secondary | ICD-10-CM

## 2013-02-15 DIAGNOSIS — G40109 Localization-related (focal) (partial) symptomatic epilepsy and epileptic syndromes with simple partial seizures, not intractable, without status epilepticus: Secondary | ICD-10-CM

## 2013-02-15 MED ORDER — LAMOTRIGINE ER 200 MG PO TB24: 200.0000 mg | ORAL_TABLET | Freq: Every evening | ORAL | Status: DC

## 2013-02-15 MED ORDER — LAMOTRIGINE ER 100 MG PO TB24: 100.0000 mg | ORAL_TABLET | Freq: Every morning | ORAL | Status: DC

## 2013-02-15 MED ORDER — CARBAMAZEPINE ER 300 MG PO CP12: 300.0000 mg | ORAL_CAPSULE | Freq: Two times a day (BID) | ORAL | Status: DC

## 2013-02-15 NOTE — Patient Instructions (Addendum)
Will come back to this office for CBC, CMP and CBZ level.  Drug list updated after speaking with [facility]  F/U yearly for appointment

## 2013-02-15 NOTE — Progress Notes (Signed)
I have read the note, and I agree with the clinical assessment and plan.  WILLIS,CHARLES KEITH   

## 2013-02-15 NOTE — Progress Notes (Signed)
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 He was last seen by Dr. Anne Hahn 02/09/2012 The patient has a history of seizures. The patient has done well on Lamictal taking 200 mg at night. The patient is on  a 100 mg tablet in the morning, and he is on Carbatrol. The patient will have occasional episodes of myoclonic jerks occurring once or twice a week. If the myoclonus is prominent, clonazepam is given with good improvement. The patient has not had an overt seizure in "a long time". The patient is more alert and interactive on lower doses of medication. His Keppra and Depakote have been discontinued since last seen, not by this office .  REVIEW OF SYSTEMS: Full 14 system review of systems performed and notable only for:  Constitutional: N/A  Cardiovascular: N/A  Ear/Nose/Throat: N/A  Skin: N/A  Eyes: N/A  Respiratory: N/A  Gastroitestinal: N/A  Hematology/Lymphatic: N/A  Endocrine: N/A Musculoskeletal:N/A  Allergy/Immunology: N/A  Neurological: N/A Psychiatric: N/A   ALLERGIES: Allergies  Allergen Reactions  . Pineapple Anaphylaxis    HOME MEDICATIONS: Outpatient Prescriptions Prior to Visit  Medication Sig Dispense Refill  . calcium citrate-vitamin D (CITRACAL+D) 315-200 MG-UNIT per tablet Take 1 tablet by mouth 2 (two) times daily.        . carbamazepine (CARBATROL) 300 MG 12 hr capsule Take 1 capsule (300 mg total) by mouth 2 (two) times daily.  180 capsule  1  . LamoTRIgine (LAMICTAL XR) 100 MG TB24 Take 1 tablet (100 mg total) by mouth every morning.  90 tablet  1  . LamoTRIgine (LAMICTAL XR) 200 MG TB24 Take 1 tablet (200 mg total) by mouth every evening.  90 tablet  1  . Olopatadine HCl (PATADAY) 0.2 % SOLN Apply 1 drop to eye daily as needed. Red  eyes      . Tamsulosin HCl (FLOMAX) 0.4 MG CAPS Take 0.4 mg by mouth daily.       . divalproex (DEPAKOTE) 500 MG DR tablet Take 1 tablet (500 mg total) by mouth 2 (two) times daily.  1 tablet  1  . levETIRAcetam (KEPPRA) 500 MG tablet Take 1 tablet (500 mg total) by mouth 2 (two) times daily.  1 tablet  1  . pantoprazole (PROTONIX) 40 MG tablet Take 1 tablet (40 mg total) by mouth daily.  1 tablet  1  . benztropine (COGENTIN) 1 MG tablet Take 1 mg by mouth 2 (two) times daily.        . clonazePAM (KLONOPIN) 0.5 MG tablet Take 1 tablet (0.5 mg total) by mouth 2 (two) times daily as needed.  60 tablet  5  . FLUoxetine (PROZAC) 40 MG capsule Take 40 mg by mouth daily.        . haloperidol (HALDOL) 5 MG tablet Take 1 tablet (5 mg total) by mouth 2 (two) times daily as needed.  1 tablet  1   No facility-administered medications prior to visit.    PAST MEDICAL HISTORY: Past Medical History  Diagnosis Date  . 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 Depression   . 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 Closed head injury   . Seizures   . Peptic ulcer disease   . Esophageal ulcer   .  Malnutrition   . Constipation   . BPH (benign prostatic hyperplasia)   . Smoker     PAST SURGICAL HISTORY: Past Surgical History  Procedure Laterality Date  . Gastrectomy    . Esophagogastroduodenoscopy  06/17/2011    Procedure: ESOPHAGOGASTRODUODENOSCOPY (EGD);  Surgeon: Graylin Shiver, MD;  Location: [facility];  Service: Endoscopy;  Laterality: N/A;    FAMILY HISTORY: Family History  Problem Relation Age of Onset  . Family history unknown: Yes    SOCIAL HISTORY: History   Social History  . Marital Status: Single    Spouse Name: N/A    Number of Children: N/A  . Years of Education: N/A   Occupational History  . Not on file.   Social History Main Topics  . Smoking status: Current Every Day Smoker -- 0.50 packs/day  . Smokeless tobacco: Former  Neurosurgeon    Quit date: 06/08/2001  . Alcohol Use: No  . Drug Use: No  . Sexual Activity: Not on file   Other Topics Concern  . Not on file   Social History Narrative  .   Not employed, lives in 24 hr care residential apartment, High school education, single with no children   PHYSICAL EXAM  Filed Vitals:   02/15/13 1117  BP: 101/65  Pulse: 93   Cannot calculate BMI with a height equal to zero.  Generalized: Alert and cooperative at the time of examination  Skin  no significant peripheral edema noted  Neurological examination   Mentation: Alert  Follows all commands,  speech is slightly dysarthric   Cranial nerve II-XII: Pupils were equal round reactive to light extraocular movements were full, visual field were full on confrontational test. Eyes are divergent with  superior gaze Facial sensation and strength were normal. hearing was intact to finger rubbing bilaterally. Uvula tongue midline. Tongue protrusion into cheek strength was normal. Motor: normal bulk and tone, full strength in the BUE, BLE, fine finger movements normal, no pronator drift. No focal weakness Coordination: finger-nose-finger, heel-to-shin bilaterally, mild  dysmetria Reflexes: Symmetric upper and lower Gait and Station: Not ambulated, wheelchair-bound .   DIAGNOSTIC DATA (LABS, IMAGING, TESTING) -None to review    ASSESSMENT AND PLAN  47 y.o. year old male  has a past medical history of Mental retardation, mild (I.Q. 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  Will come back to this office for CBC, CMP and CBZ level.  Drug list updated after speaking with [facility] regarding seizure medications which have been discontinued since last seen she is not sure who discontinued the meds. Patient had 2 seizures since last seen according to this representative. F/U yearly for  appointment Nilda Riggs, [facility], [facility], APRN  [facility] [address] [place], [place] [address] [phone]

## 2013-02-21 ENCOUNTER — Telehealth: Payer: Self-pay | Admitting: Neurology

## 2013-02-23 NOTE — Telephone Encounter (Signed)
Spoke with Wallace Cullens, will update order for Clonazepam, pt to get CBC, CMP and CBZ in am.

## 2013-02-24 ENCOUNTER — Other Ambulatory Visit (INDEPENDENT_AMBULATORY_CARE_PROVIDER_SITE_OTHER): Payer: Self-pay

## 2013-02-24 DIAGNOSIS — Z0289 Encounter for other administrative examinations: Secondary | ICD-10-CM

## 2013-02-24 LAB — CBC WITH DIFFERENTIAL/PLATELET
Basophils Absolute: 0 10*3/uL (ref 0.0–0.2)
Eosinophils Absolute: 0.3 10*3/uL (ref 0.0–0.4)
Hemoglobin: 12.2 g/dL — ABNORMAL LOW (ref 12.6–17.7)
Lymphs: 30 %
MCH: 31 pg (ref 26.6–33.0)
MCHC: 34 g/dL (ref 31.5–35.7)
MCV: 91 fL (ref 79–97)
Monocytes Absolute: 0.6 10*3/uL (ref 0.1–0.9)
Neutrophils Absolute: 2.3 10*3/uL (ref 1.4–7.0)

## 2013-02-24 LAB — COMPREHENSIVE METABOLIC PANEL
ALT: 21 IU/L (ref 0–44)
AST: 17 IU/L (ref 0–40)
Alkaline Phosphatase: 117 IU/L (ref 39–117)
CO2: 27 mmol/L (ref 18–29)
Calcium: 8.8 mg/dL (ref 8.7–10.2)
Chloride: 102 mmol/L (ref 96–108)
Creatinine, Ser: 0.9 mg/dL (ref 0.76–1.27)
Globulin, Total: 2.6 g/dL (ref 1.5–4.5)
Potassium: 4.3 mmol/L (ref 3.5–5.2)
Sodium: 138 mmol/L (ref 134–144)

## 2013-02-24 LAB — CARBAMAZEPINE LEVEL, TOTAL: Carbamazepine Lvl: 7.4 ug/mL (ref 4.0–12.0)

## 2013-02-28 NOTE — Progress Notes (Signed)
Quick Note:  I called and spoke to [facility] and let them know that pts labs are stable. He verbalized understanding. ______

## 2013-04-29 ENCOUNTER — Other Ambulatory Visit: Payer: Self-pay

## 2013-04-29 MED ORDER — CLONAZEPAM 0.5 MG PO TABS: 0.5000 mg | ORAL_TABLET | Freq: Two times a day (BID) | ORAL | Status: DC | PRN

## 2013-05-02 NOTE — Telephone Encounter (Signed)
Rx faxed over to [facility] at [phone].

## 2013-10-08 ENCOUNTER — Other Ambulatory Visit: Payer: Self-pay | Admitting: Nurse Practitioner

## 2013-10-08 ENCOUNTER — Other Ambulatory Visit: Payer: Self-pay | Admitting: Neurology

## 2013-10-10 ENCOUNTER — Other Ambulatory Visit: Payer: Self-pay | Admitting: Neurology

## 2013-10-11 NOTE — Telephone Encounter (Signed)
Rx has been faxed.

## 2014-01-27 ENCOUNTER — Encounter: Payer: Self-pay | Admitting: *Deleted

## 2014-02-02 ENCOUNTER — Telehealth: Payer: Self-pay | Admitting: *Deleted

## 2014-02-02 NOTE — Telephone Encounter (Signed)
Left patient a message that is appt scheduled for 02-15-14 needs to be rescheduled.

## 2014-02-08 ENCOUNTER — Encounter: Payer: Self-pay | Admitting: *Deleted

## 2014-02-08 NOTE — Telephone Encounter (Signed)
Calling patient to r/s appointment, no answer left message to return the call, letter will be mailed out.

## 2014-02-15 ENCOUNTER — Ambulatory Visit: Payer: Medicare Other | Admitting: Nurse Practitioner

## 2014-02-16 ENCOUNTER — Encounter: Payer: Self-pay | Admitting: Nurse Practitioner

## 2014-02-16 ENCOUNTER — Ambulatory Visit (INDEPENDENT_AMBULATORY_CARE_PROVIDER_SITE_OTHER): Payer: Medicare Other | Admitting: Nurse Practitioner

## 2014-02-16 ENCOUNTER — Encounter (INDEPENDENT_AMBULATORY_CARE_PROVIDER_SITE_OTHER): Payer: Self-pay

## 2014-02-16 VITALS — BP 92/63 | HR 78

## 2014-02-16 DIAGNOSIS — G40309 Generalized idiopathic epilepsy and epileptic syndromes, not intractable, without status epilepticus: Secondary | ICD-10-CM

## 2014-02-16 DIAGNOSIS — R269 Unspecified abnormalities of gait and mobility: Secondary | ICD-10-CM

## 2014-02-16 DIAGNOSIS — G8110 Spastic hemiplegia affecting unspecified side: Secondary | ICD-10-CM

## 2014-02-16 DIAGNOSIS — G40109 Localization-related (focal) (partial) symptomatic epilepsy and epileptic syndromes with simple partial seizures, not intractable, without status epilepticus: Secondary | ICD-10-CM

## 2014-02-16 DIAGNOSIS — G939 Disorder of brain, unspecified: Secondary | ICD-10-CM

## 2014-02-16 DIAGNOSIS — Z5181 Encounter for therapeutic drug level monitoring: Secondary | ICD-10-CM

## 2014-02-16 LAB — CBC WITH DIFFERENTIAL/PLATELET
BASOS: 2 %
Basophils Absolute: 0 10*3/uL (ref 0.0–0.2)
EOS ABS: 0.2 10*3/uL (ref 0.0–0.4)
Eos: 6 %
HCT: 38.4 % (ref 37.5–51.0)
HEMOGLOBIN: 13.1 g/dL (ref 12.6–17.7)
Lymphocytes Absolute: 1 10*3/uL (ref 0.7–3.1)
Lymphs: 37 %
MCH: 29.1 pg (ref 26.6–33.0)
MCHC: 34.1 g/dL (ref 31.5–35.7)
MCV: 85 fL (ref 79–97)
Monocytes Absolute: 0.5 10*3/uL (ref 0.1–0.9)
Monocytes: 17 %
NEUTROS ABS: 1 10*3/uL — AB (ref 1.4–7.0)
NEUTROS PCT: 38 %
RBC: 4.5 x10E6/uL (ref 4.14–5.80)
RDW: 14.1 % (ref 12.3–15.4)
WBC: 2.7 10*3/uL — ABNORMAL LOW (ref 3.4–10.8)

## 2014-02-16 LAB — COMPREHENSIVE METABOLIC PANEL
ALK PHOS: 117 IU/L (ref 39–117)
ALT: 25 IU/L (ref 0–44)
AST: 21 IU/L (ref 0–40)
Albumin/Globulin Ratio: 1.5 (ref 1.1–2.5)
Albumin: 4.3 g/dL (ref 3.5–5.5)
BUN / CREAT RATIO: 8 — AB (ref 9–20)
BUN: 7 mg/dL (ref 6–24)
CHLORIDE: 101 mmol/L (ref 96–108)
CO2: 31 mmol/L — ABNORMAL HIGH (ref 18–29)
Calcium: 9.3 mg/dL (ref 8.7–10.2)
Creatinine, Ser: 0.93 mg/dL (ref 0.76–1.27)
GFR calc Af Amer: 113 mL/min/{1.73_m2} (ref >59)
GFR calc non Af Amer: 98 mL/min/{1.73_m2} (ref >59)
Globulin, Total: 2.8 g/dL (ref 1.5–4.5)
Glucose: 84 mg/dL (ref 65–99)
Potassium: 4.1 mmol/L (ref 3.5–5.2)
SODIUM: 140 mmol/L (ref 134–144)
Total Bilirubin: <0.2 mg/dL (ref 0.0–1.2)
Total Protein: 7.1 g/dL (ref 6.0–8.5)

## 2014-02-16 LAB — CARBAMAZEPINE LEVEL, TOTAL: CARBAMAZEPINE LVL: 7.7 ug/mL (ref 4.0–12.0)

## 2014-02-16 MED ORDER — CARBAMAZEPINE ER 300 MG PO CP12: 300.0000 mg | ORAL_CAPSULE | Freq: Two times a day (BID) | ORAL | Status: DC

## 2014-02-16 MED ORDER — LAMOTRIGINE ER 100 MG PO TB24: 100.0000 mg | ORAL_TABLET | Freq: Every day | ORAL | Status: DC

## 2014-02-16 MED ORDER — LAMOTRIGINE ER 200 MG PO TB24: 200.0000 mg | ORAL_TABLET | Freq: Every day | ORAL | Status: DC

## 2014-02-16 NOTE — Patient Instructions (Signed)
Continue Lamictal at current dose Continue Carbatrol at current dose Labs today Followup yearly and when necessary

## 2014-02-16 NOTE — Progress Notes (Signed)
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 He was last seen 02/15/13.  The patient has a history of seizures. The patient has done well on Lamictal taking 200 mg at night. The patient is on a 100 mg tablet in the morning, and he is on Carbatrol. The patient will have occasional episodes of myoclonic jerks occurring once or twice a week. If the myoclonus is prominent, clonazepam is given with good improvement. The patient has not had an overt seizure in "a long time". The patient is more alert and interactive on lower doses of medication. His Keppra and Depakote have been discontinued last year  not by this office . He returns for reevaluation. According to the person from [facility] he has not had any seizures since last seen. He attends a day program. He returns for reevaluation   Is REVIEW OF SYSTEMS: Full 14 system review of systems performed and notable only for those listed, all others are neg:  Constitutional: N/A  Cardiovascular: N/A  Ear/Nose/Throat: N/A  Skin: N/A  Eyes: N/A  Respiratory: N/A  Gastroitestinal: N/A  Hematology/Lymphatic: N/A  Endocrine: N/A Musculoskeletal:N/A  Allergy/Immunology: N/A  Neurological: N/A Psychiatric: N/A Sleep : NA   ALLERGIES: Allergies  Allergen Reactions  . Pineapple Anaphylaxis    HOME MEDICATIONS: Outpatient Prescriptions Prior to Visit  Medication Sig Dispense Refill  . calcium citrate-vitamin D (CITRACAL+D) 315-200 MG-UNIT per tablet Take 1 tablet by mouth 2 (two) times daily.        . carbamazepine (CARBATROL) 300 MG 12 hr capsule TAKE ONE CAPSULE BY MOUTH TWICE DAILY  180 capsule  1  . clonazePAM (KLONOPIN) 0.5 MG tablet TAKE ONE TABLET BY MOUTH TWICE DAILY AS NEEDED  60 tablet  5  . escitalopram  (LEXAPRO) 10 MG tablet Take 10 mg by mouth daily.      . LamoTRIgine 100 MG TB24 TAKE ONE TABLET BY MOUTH EVERY MORNING  90 tablet  1  . LamoTRIgine XR 200 MG TB24 TAKE ONE TABLET BY MOUTH EVERY DAY  90 tablet  1  . Tamsulosin HCl (FLOMAX) 0.4 MG CAPS Take 0.4 mg by mouth daily.       . Olopatadine HCl (PATADAY) 0.2 % SOLN Apply 1 drop to eye daily as needed. Red eyes      . pantoprazole (PROTONIX) 40 MG tablet Take 1 tablet (40 mg total) by mouth daily.  1 tablet  1   No facility-administered medications prior to visit.    PAST MEDICAL HISTORY: Past Medical History  Diagnosis Date  . 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 Depression   . 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 Closed head injury   . Seizures   . Peptic ulcer disease   . Esophageal ulcer   . Malnutrition   . Constipation   . BPH (benign prostatic hyperplasia)   . Smoker     PAST SURGICAL HISTORY: Past Surgical History  Procedure Laterality Date  . Gastrectomy    . Esophagogastroduodenoscopy  06/17/2011    Procedure: ESOPHAGOGASTRODUODENOSCOPY (EGD);  Surgeon: Wonda Horner, MD;  Location: [facility];  Service: Endoscopy;  Laterality: N/A;    FAMILY HISTORY: History reviewed. No pertinent family history.  SOCIAL HISTORY: History   Social History  .  Marital Status: Single    Spouse Name: N/A    Number of Children: N/A  . Years of Education: N/A   Occupational History  . Not on file.   Social History Main Topics  . Smoking status: Current Every Day Smoker -- 0.50 packs/day  . Smokeless tobacco: Former Systems developer    Quit date: 06/08/2001  . Alcohol Use: No  . Drug Use: No  . Sexual Activity: Not on file   Other Topics Concern  . Not on file   Social History Narrative  . No narrative on file     PHYSICAL EXAM  Filed Vitals:   02/16/14 0944  BP: 92/63  Pulse: 78   Cannot calculate BMI with a height equal to zero. Generalized: Alert and  cooperative at the time of examination  Skin no significant peripheral edema noted  Neurological examination  Mentation: Alert Follows all commands, speech is slightly dysarthric  Cranial nerve II-XII: Pupils were equal round reactive to light extraocular movements were full, visual field were full on confrontational test. Eyes are divergent with superior gaze Facial sensation and strength were normal. hearing was intact to finger rubbing bilaterally. Uvula tongue midline. Tongue protrusion into cheek strength was normal.  Motor: normal bulk and tone, full strength in the BUE, BLE, fine finger movements normal, no pronator drift. No focal weakness  Coordination: finger-nose-finger, heel-to-shin bilaterally, mild dysmetria  Reflexes: Symmetric upper and lower  Gait and Station: Not ambulated, wheelchair-bound .     DIAGNOSTIC DATA (LABS, IMAGING, TESTING) - I reviewed patient records, labs, notes, testing and imaging myself where available.  Lab Results  Component Value Date   WBC 4.5 02/24/2013   HGB 12.2* 02/24/2013   HCT 35.9* 02/24/2013   MCV 91 02/24/2013   PLT 305 06/17/2011      Component Value Date/Time   NA 138 02/24/2013 1002   NA 139 06/17/2011 0500   K 4.3 02/24/2013 1002   CL 102 02/24/2013 1002   CO2 27 02/24/2013 1002   GLUCOSE 85 02/24/2013 1002   GLUCOSE 85 06/17/2011 0500   BUN 14 02/24/2013 1002   BUN 6 06/17/2011 0500   CREATININE 0.90 02/24/2013 1002   CALCIUM 8.8 02/24/2013 1002   PROT 6.4 02/24/2013 1002   PROT 6.2 12/11/2010 0331   ALBUMIN 2.7* 12/11/2010 0331   AST 17 02/24/2013 1002   ALT 21 02/24/2013 1002   ALKPHOS 117 02/24/2013 1002   BILITOT 0.2 02/24/2013 1002   GFRNONAA 103 02/24/2013 1002   GFRAA 119 02/24/2013 1002    ASSESSMENT AND PLAN  48 y.o. year old male  has a past medical history of Mental retardation, mild (I.Q. 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  Continue Lamictal at current dose, will refill Continue Carbatrol at current dose, will refill Labs today, CBC, CMP and CBZ Followup yearly and when necessary Dennie Bible, [facility], [facility], [facility] [address] [place], [place] [address] [phone]

## 2014-02-17 ENCOUNTER — Telehealth: Payer: Self-pay | Admitting: Nurse Practitioner

## 2014-02-17 DIAGNOSIS — G40309 Generalized idiopathic epilepsy and epileptic syndromes, not intractable, without status epilepticus: Secondary | ICD-10-CM

## 2014-02-17 NOTE — Telephone Encounter (Signed)
Called both numbers for [facility]. No ability to leave message

## 2014-02-17 NOTE — Progress Notes (Signed)
I have read the note, and I agree with the clinical assessment and plan.  WILLIS,CHARLES KEITH   

## 2014-02-17 NOTE — Telephone Encounter (Signed)
Called [facility] to let them know labs are good except abnormal CBC. This needs to be repeated in 1 month. Please call our office back so I can fax the order or he can have it done here.

## 2014-02-22 NOTE — Telephone Encounter (Signed)
Called no one answered left voice mail to return my call.

## 2014-02-22 NOTE — Telephone Encounter (Signed)
Please call [facility] and get fax number so I can send order for repeat labs. Thanks

## 2014-02-23 NOTE — Telephone Encounter (Signed)
Called and spoke to the medical cordinator [name], fax # [phone] best number# [phone]

## 2014-02-23 NOTE — Telephone Encounter (Signed)
Order placed and faxed

## 2014-04-03 ENCOUNTER — Emergency Department (HOSPITAL_COMMUNITY)
Admission: EM | Admit: 2014-04-03 | Discharge: 2014-04-03 | Disposition: A | Discharge status: Home / Self Care | Payer: Medicare Other | Attending: Emergency Medicine | Admitting: Emergency Medicine

## 2014-04-03 ENCOUNTER — Encounter (HOSPITAL_COMMUNITY): Payer: Self-pay | Admitting: *Deleted

## 2014-04-03 DIAGNOSIS — Z87828 Personal history of other (healed) physical injury and trauma: Secondary | ICD-10-CM | POA: Insufficient documentation

## 2014-04-03 DIAGNOSIS — G40909 Epilepsy, unspecified, not intractable, without status epilepticus: Secondary | ICD-10-CM | POA: Insufficient documentation

## 2014-04-03 DIAGNOSIS — Z79899 Other long term (current) drug therapy: Secondary | ICD-10-CM | POA: Diagnosis not present

## 2014-04-03 DIAGNOSIS — Z8711 Personal history of peptic ulcer disease: Secondary | ICD-10-CM | POA: Insufficient documentation

## 2014-04-03 DIAGNOSIS — W01198A Fall on same level from slipping, tripping and stumbling with subsequent striking against other object, initial encounter: Secondary | ICD-10-CM | POA: Diagnosis not present

## 2014-04-03 DIAGNOSIS — Z23 Encounter for immunization: Secondary | ICD-10-CM | POA: Insufficient documentation

## 2014-04-03 DIAGNOSIS — N400 Enlarged prostate without lower urinary tract symptoms: Secondary | ICD-10-CM | POA: Insufficient documentation

## 2014-04-03 DIAGNOSIS — F329 Major depressive disorder, single episode, unspecified: Secondary | ICD-10-CM | POA: Diagnosis not present

## 2014-04-03 DIAGNOSIS — Y9389 Activity, other specified: Secondary | ICD-10-CM | POA: Insufficient documentation

## 2014-04-03 DIAGNOSIS — Y998 Other external cause status: Secondary | ICD-10-CM | POA: Diagnosis not present

## 2014-04-03 DIAGNOSIS — S0181XA Laceration without foreign body of other part of head, initial encounter: Secondary | ICD-10-CM

## 2014-04-03 DIAGNOSIS — S01112A Laceration without foreign body of left eyelid and periocular area, initial encounter: Secondary | ICD-10-CM | POA: Insufficient documentation

## 2014-04-03 DIAGNOSIS — Z8719 Personal history of other diseases of the digestive system: Secondary | ICD-10-CM | POA: Diagnosis not present

## 2014-04-03 DIAGNOSIS — Y92192 Bathroom in other specified residential institution as the place of occurrence of the external cause: Secondary | ICD-10-CM | POA: Diagnosis not present

## 2014-04-03 MED ORDER — TETANUS-DIPHTH-ACELL PERTUSSIS 5-2.5-18.5 LF-MCG/0.5 IM SUSP
0.5000 mL | Freq: Once | INTRAMUSCULAR | Status: AC
  Administered 2014-04-03: 0.5 mL via INTRAMUSCULAR
  Filled 2014-04-03: qty 0.5

## 2014-04-03 MED ORDER — LIDOCAINE-EPINEPHRINE-TETRACAINE (LET) SOLUTION
3.0000 mL | Freq: Once | NASAL | Status: AC
  Administered 2014-04-03: 3 mL via TOPICAL
  Filled 2014-04-03: qty 3

## 2014-04-03 NOTE — Discharge Instructions (Signed)
Facial Laceration ° A facial laceration is a cut on the face. These injuries can be painful and cause bleeding. Lacerations usually heal quickly, but they need special care to reduce scarring. °DIAGNOSIS  °Your health care provider will take a medical history, ask for details about how the injury occurred, and examine the wound to determine how deep the cut is. °TREATMENT  °Some facial lacerations may not require closure. Others may not be able to be closed because of an increased risk of infection. The risk of infection and the chance for successful closure will depend on various factors, including the amount of time since the injury occurred. °The wound may be cleaned to help prevent infection. If closure is appropriate, pain medicines may be given if needed. Your health care provider will use stitches (sutures), wound glue (adhesive), or skin adhesive strips to repair the laceration. These tools bring the skin edges together to allow for faster healing and a better cosmetic outcome. If needed, you may also be given a tetanus shot. °HOME CARE INSTRUCTIONS °· Only take over-the-counter or prescription medicines as directed by your health care provider. °· Follow your health care provider's instructions for wound care. These instructions will vary depending on the technique used for closing the wound. °For Sutures: °· Keep the wound clean and dry.   °· If you were given a bandage (dressing), you should change it at least once a day. Also change the dressing if it becomes wet or dirty, or as directed by your health care provider.   °· Wash the wound with soap and water 2 times a day. Rinse the wound off with water to remove all soap. Pat the wound dry with a clean towel.   °· After cleaning, apply a thin layer of the antibiotic ointment recommended by your health care provider. This will help prevent infection and keep the dressing from sticking.   °· You may shower as usual after the first 24 hours. Do not soak the  wound in water until the sutures are removed.   °· Get your sutures removed as directed by your health care provider. With facial lacerations, sutures should usually be taken out after 4-5 days to avoid stitch marks.   °· Wait a few days after your sutures are removed before applying any makeup. °For Skin Adhesive Strips: °· Keep the wound clean and dry.   °· Do not get the skin adhesive strips wet. You may bathe carefully, using caution to keep the wound dry.   °· If the wound gets wet, pat it dry with a clean towel.   °· Skin adhesive strips will fall off on their own. You may trim the strips as the wound heals. Do not remove skin adhesive strips that are still stuck to the wound. They will fall off in time.   °For Wound Adhesive: °· You may briefly wet your wound in the shower or bath. Do not soak or scrub the wound. Do not swim. Avoid periods of heavy sweating until the skin adhesive has fallen off on its own. After showering or bathing, gently pat the wound dry with a clean towel.   °· Do not apply liquid medicine, cream medicine, ointment medicine, or makeup to your wound while the skin adhesive is in place. This may loosen the film before your wound is healed.   °· If a dressing is placed over the wound, be careful not to apply tape directly over the skin adhesive. This may cause the adhesive to be pulled off before the wound is healed.   °· Avoid   prolonged exposure to sunlight or tanning lamps while the skin adhesive is in place.  The skin adhesive will usually remain in place for 5-10 days, then naturally fall off the skin. Do not pick at the adhesive film.  After Healing: Once the wound has healed, cover the wound with sunscreen during the day for 1 full year. This can help minimize scarring. Exposure to ultraviolet light in the first year will darken the scar. It can take 1-2 years for the scar to lose its redness and to heal completely.  SEEK IMMEDIATE MEDICAL CARE IF:  You have redness, pain, or  swelling around the wound.   You see ayellowish-white fluid (pus) coming from the wound.   You have chills or a fever.  MAKE SURE YOU:  Understand these instructions.  Will watch your condition.  Will get help right away if you are not doing well or get worse. Document Released: 06/19/2004 Document Revised: 03/02/2013 Document Reviewed: 12/23/2012 [facility] Patient Information 2015 [place], [place]. This information is not intended to replace advice given to you by your health care provider. Make sure you discuss any questions you have with your health care provider. Td Vaccine (Tetanus and Diphtheria): What You Need to Know 1. Why get vaccinated? Tetanus  and diphtheria are very serious diseases. They are rare in the [place] today, but people who do become infected often have severe complications. Td vaccine is used to protect adolescents and adults from both of these diseases. Both tetanus and diphtheria are infections caused by bacteria. Diphtheria spreads from person to person through coughing or sneezing. Tetanus-causing bacteria enter the body through cuts, scratches, or wounds. TETANUS (Lockjaw) causes painful muscle tightening and stiffness, usually all over the body.  It can lead to tightening of muscles in the head and neck so you can't open your mouth, swallow, or sometimes even breathe. Tetanus kills about 1 out of every 5 people who are infected. DIPHTHERIA can cause a thick coating to form in the back of the throat.  It can lead to breathing problems, paralysis, heart failure, and death. Before vaccines, the [place] saw as many as 200,000 cases a year of diphtheria and hundreds of cases of tetanus. Since vaccination began, cases of both diseases have dropped by about 99%. 2. Td vaccine Td vaccine can protect adolescents and adults from tetanus and diphtheria. Td is usually given as a booster dose every 10 years but it can also be given earlier after a severe and  dirty wound or burn. Your doctor can give you more information. Td may safely be given at the same time as other vaccines. 3. Some people should not get this vaccine  If you ever had a life-threatening allergic reaction after a dose of any tetanus or diphtheria containing vaccine, OR if you have a severe allergy to any part of this vaccine, you should not get Td. Tell your doctor if you have any severe allergies.  Talk to your doctor if you:  have epilepsy or another nervous system problem,  had severe pain or swelling after any vaccine containing diphtheria or tetanus,  ever had Guillain Barr Syndrome (GBS),  aren't feeling well on the day the shot is scheduled. 4. Risks of a vaccine reaction With a vaccine, like any medicine, there is a chance of side effects. These are usually mild and go away on their own. Serious side effects are also possible, but are very rare. Most people who get Td vaccine do not have any  problems with it. Mild Problems  following Td (Did not interfere with activities)  Pain where the shot was given (about 8 people in 10)  Redness or swelling where the shot was given (about 1 person in 3)  Mild fever (about 1 person in 15)  Headache or Tiredness (uncommon) Moderate Problems following Td (Interfered with activities, but did not require medical attention)  Fever over 102F (rare) Severe Problems  following Td (Unable to perform usual activities; required medical attention)  Swelling, severe pain, bleeding and/or redness in the arm where the shot was given (rare). Problems that could happen after any vaccine:  Brief fainting spells can happen after any medical procedure, including vaccination. Sitting or lying down for about 15 minutes can help prevent fainting, and injuries caused by a fall. Tell your doctor if you feel dizzy, or have vision changes or ringing in the ears.  Severe shoulder pain and reduced range of motion in the arm where a shot was  given can happen, very rarely, after a vaccination.  Severe allergic reactions from a vaccine are very rare, estimated at less than 1 in a million doses. If one were to occur, it would usually be within a few minutes to a few hours after the vaccination. 5. What if there is a serious reaction? What should I look for?  Look for anything that concerns you, such as signs of a severe allergic reaction, very high fever, or behavior changes. Signs of a severe allergic reaction can include hives, swelling of the face and throat, difficulty breathing, a fast heartbeat, dizziness, and weakness. These would usually start a few minutes to a few hours after the vaccination. What should I do?  If you think it is a severe allergic reaction or other emergency that can't wait, call 9-1-1 or get the person to the nearest hospital. Otherwise, call your doctor.  Afterward, the reaction should be reported to the Vaccine Adverse Event Reporting System (VAERS). Your doctor might file this report, or you can do it yourself through the VAERS web site at www.vaers.SamedayNews.es, or by calling 437-837-2414. VAERS is only for reporting reactions. They do not give medical advice. 6. The National Vaccine Injury Compensation Program The [employer] Vaccine Injury Compensation Program (VICP) is a federal program that was created to compensate people who may have been injured by certain vaccines. Persons who believe they may have been injured by a vaccine can learn about the program and about filing a claim by calling 662-857-9458 or visiting the [place] website at GoldCloset.com.ee. 7. How can I learn more?  Ask your doctor.  Contact your local or state health department.  Contact the Centers for Disease Control and Prevention (CDC):  Call 289-307-0715 (1-800-CDC-INFO)  Visit CDC's website at http://hunter.com/ CDC Td Vaccine Interim VIS (06/29/12) Document Released: 03/09/2006 Document Revised: 09/26/2013  Document Reviewed: 08/24/2013 [facility] Patient Information 2015 [place], [place]. This information is not intended to replace advice given to you by your health care provider. Make sure you discuss any questions you have with your health care provider. Sutured Wound Care Sutures are stitches that can be used to close wounds. Wound care helps prevent pain and infection.  HOME CARE INSTRUCTIONS   Rest and elevate the injured area until all the pain and swelling are gone.  Only take over-the-counter or prescription medicines for pain, discomfort, or fever as directed by your caregiver.  After 48 hours, gently wash the area with mild soap and water once a day, or as directed. Rinse  off the soap. Pat the area dry with a clean towel. Do not rub the wound. This may cause bleeding.  Follow your caregiver's instructions for how often to change the bandage (dressing). Stop using a dressing after 2 days or after the wound stops draining.  If the dressing sticks, moisten it with soapy water and gently remove it.  Apply ointment on the wound as directed.  Avoid stretching a sutured wound.  Drink enough fluids to keep your urine clear or pale yellow.  Follow up with your caregiver for suture removal as directed.  Use sunscreen on your wound for the next 3 to 6 months so the scar will not darken. SEEK IMMEDIATE MEDICAL CARE IF:   Your wound becomes red, swollen, hot, or tender.  You have increasing pain in the wound.  You have a red streak that extends from the wound.  There is pus coming from the wound.  You have a fever.  You have shaking chills.  There is a bad smell coming from the wound.  You have persistent bleeding from the wound. MAKE SURE YOU:   Understand these instructions.  Will watch your condition.  Will get help right away if you are not doing well or get worse. Document Released: 06/19/2004 Document Revised: 08/04/2011 Document Reviewed: 09/15/2010 [facility] Patient  Information 2015 [place], [place]. This information is not intended to replace advice given to you by your health care provider. Make sure you discuss any questions you have with your health care provider.

## 2014-04-03 NOTE — ED Provider Notes (Signed)
CSN: [id number]     Arrival date & time 04/03/14  1849 History   None    Chief Complaint  Patient presents with  . Facial Laceration   The history is provided by the patient. No language interpreter was used.   This chart was scribed for non-physician practitioner, Clayton Bibles PA-C working with No att. providers found, by Thea Alken, ED Scribe. This patient was seen in room WTR9/WTR9 and the patient's care was started at 7:47 PM.  [name] is a 48 y.o. male brought in by caregiver who presents to the Emergency Department complaining of a laceration to left head. Caregiver reports pt slipped in the bathroom and hit his head on the floor. Caregiver is unsure of TDAP is UTD. Caregiver denies LOC. Pt denies CP and abdominal pain.   Past Medical History  Diagnosis Date  . 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 Depression   . 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 Closed head injury   . Seizures   . Peptic ulcer disease   . Esophageal ulcer   . Malnutrition   . Constipation   . BPH (benign prostatic hyperplasia)   . Smoker    Past Surgical History  Procedure Laterality Date  . Gastrectomy    . Esophagogastroduodenoscopy  06/17/2011    Procedure: ESOPHAGOGASTRODUODENOSCOPY (EGD);  Surgeon: Wonda Horner, MD;  Location: [facility];  Service: Endoscopy;  Laterality: N/A;   No family history on file.  Review of Systems  Cardiovascular: Negative for chest pain.  Gastrointestinal: Negative for abdominal pain.  Skin: Positive for wound.   Allergies  Pineapple  Home Medications   Prior to Admission medications   Medication Sig Start Date End Date Taking? Authorizing Provider  calcium citrate-vitamin D (CITRACAL+D) 315-200 MG-UNIT per tablet Take 1 tablet by mouth 2 (two) times daily.      Historical Provider, MD  carbamazepine (CARBATROL) 300 MG 12 hr capsule Take 1 capsule (300 mg total) by mouth 2 (two) times daily. 02/16/14   Dennie Bible, NP  clonazePAM (KLONOPIN) 0.5 MG tablet TAKE ONE TABLET BY MOUTH TWICE DAILY AS NEEDED 10/10/13   Kathrynn Ducking, MD  escitalopram (LEXAPRO) 10 MG tablet Take 10 mg by mouth daily.    Historical Provider, MD  LamoTRIgine 100 MG TB24 Take 1 tablet (100 mg total) by mouth daily. 02/16/14   Dennie Bible, NP  LamoTRIgine XR 200 MG TB24 Take 1 tablet (200 mg total) by mouth daily. 02/16/14   Dennie Bible, NP  pantoprazole (PROTONIX) 40 MG tablet Take 1 tablet (40 mg total) by mouth daily. 08/27/11 08/26/12  Lear Ng, MD  QUEtiapine (SEROQUEL) 400 MG tablet 400 mg at bedtime.    Historical Provider, MD  Tamsulosin HCl (FLOMAX) 0.4 MG CAPS Take 0.4 mg by mouth daily.     Historical Provider, MD   BP 115/90 mmHg  Pulse 72  Temp(Src) 97.4 F (36.3 C) (Oral)  Resp 16  SpO2 100% Physical Exam  Constitutional: He is oriented to person, place, and time. He appears well-developed and well-nourished. No distress.  HENT:  Head: Normocephalic and atraumatic.  Eyes full, pain free ROM. No hemotympanum  Eyes: Conjunctivae and EOM are normal.  Neck: Neck supple.  Cardiovascular: Normal rate.   Pulmonary/Chest: Effort normal.  Abdominal: Soft. He exhibits no distension. There is no tenderness.  Musculoskeletal: Normal range of motion.  No midline cervical spine tenderness  Neurological:  He is alert and oriented to person, place, and time.  Skin: Skin is warm and dry.  2cm laceration left lateral eyebrow. No hematoma. no facial bone tenderness  Psychiatric: He has a normal mood and affect. His behavior is normal.  Nursing note and vitals reviewed.   ED Course  Procedures  LACERATION REPAIR Performed by: Charlann Lange Consent: Verbal consent obtained. Risks and benefits: risks, benefits and alternatives were discussed Patient identity confirmed: provided demographic data Time out performed prior to procedure Prepped and Draped in normal sterile fashion Wound  explored Laceration Location: left lateral eyebrow Laceration Length: 3cm No Foreign Bodies seen or palpated Anesthesia: local topical Local anesthetic: L.E.T Anesthetic total: n/a  Irrigation method: syringe Amount of cleaning: standard Skin closure: 6 o prolene   Number of sutures or staples: 6 Technique: simple  Patient tolerance: Patient tolerated the procedure well with no immediate complications.  DIAGNOSTIC STUDIES: Oxygen Saturation is 100% on RA, normal by my interpretation.    COORDINATION OF CARE: 8:11 PM- Pt advised of plan for treatment and pt agrees.  Labs Review Labs Reviewed - No data to display  Imaging Review No results found.   EKG Interpretation None      MDM   Final diagnoses:  None   1. Facial laceration  Uncomplicated laceration with repair per note, after witnessed mechanical fall. No concern for facial fracture neck injury, IC head injury.  I personally performed the services described in this documentation, which was scribed in my presence. The recorded information has been reviewed and is accurate.      Dewaine Oats, PA-C 04/03/14 [place], MD 04/03/14 2340

## 2014-04-03 NOTE — ED Notes (Signed)
Pt states he fell in the bathroom, and hit side of head on floor, pts fall was witnessed, pts caretaker states he likes to do things himself, denies LOC, denies dizziness, states headache comes and goes, currently does not have one, pt per norm, a/o x 3, laceration noted to L side of head near eye, bleeding controlled at this time, bandaide over.

## 2014-09-11 ENCOUNTER — Emergency Department (HOSPITAL_COMMUNITY)
Admission: EM | Admit: 2014-09-11 | Discharge: 2014-09-11 | Disposition: A | Discharge status: Home / Self Care | Payer: Medicare Other | Attending: Emergency Medicine | Admitting: Emergency Medicine

## 2014-09-11 ENCOUNTER — Emergency Department (HOSPITAL_COMMUNITY): Payer: Medicare Other

## 2014-09-11 ENCOUNTER — Encounter (HOSPITAL_COMMUNITY): Payer: Self-pay | Admitting: Emergency Medicine

## 2014-09-11 DIAGNOSIS — F329 Major depressive disorder, single episode, unspecified: Secondary | ICD-10-CM | POA: Insufficient documentation

## 2014-09-11 DIAGNOSIS — G40909 Epilepsy, unspecified, not intractable, without status epilepticus: Secondary | ICD-10-CM | POA: Insufficient documentation

## 2014-09-11 DIAGNOSIS — Z79899 Other long term (current) drug therapy: Secondary | ICD-10-CM | POA: Diagnosis not present

## 2014-09-11 DIAGNOSIS — Z8639 Personal history of other endocrine, nutritional and metabolic disease: Secondary | ICD-10-CM | POA: Insufficient documentation

## 2014-09-11 DIAGNOSIS — Z87828 Personal history of other (healed) physical injury and trauma: Secondary | ICD-10-CM | POA: Diagnosis not present

## 2014-09-11 DIAGNOSIS — Z720 Tobacco use: Secondary | ICD-10-CM | POA: Diagnosis not present

## 2014-09-11 DIAGNOSIS — R0789 Other chest pain: Secondary | ICD-10-CM | POA: Insufficient documentation

## 2014-09-11 DIAGNOSIS — Z8719 Personal history of other diseases of the digestive system: Secondary | ICD-10-CM | POA: Diagnosis not present

## 2014-09-11 DIAGNOSIS — N400 Enlarged prostate without lower urinary tract symptoms: Secondary | ICD-10-CM | POA: Diagnosis not present

## 2014-09-11 DIAGNOSIS — F70 Mild intellectual disabilities: Secondary | ICD-10-CM | POA: Diagnosis not present

## 2014-09-11 DIAGNOSIS — Z8711 Personal history of peptic ulcer disease: Secondary | ICD-10-CM | POA: Diagnosis not present

## 2014-09-11 DIAGNOSIS — R109 Unspecified abdominal pain: Secondary | ICD-10-CM | POA: Diagnosis present

## 2014-09-11 DIAGNOSIS — R079 Chest pain, unspecified: Secondary | ICD-10-CM

## 2014-09-11 LAB — COMPREHENSIVE METABOLIC PANEL
ALT: 30 U/L (ref 0–53)
AST: 25 U/L (ref 0–37)
Albumin: 4.1 g/dL (ref 3.5–5.2)
Alkaline Phosphatase: 104 U/L (ref 39–117)
Anion gap: 5 (ref 5–15)
BUN: 12 mg/dL (ref 6–23)
CO2: 28 mmol/L (ref 19–32)
Calcium: 8.4 mg/dL (ref 8.4–10.5)
Chloride: 100 mmol/L (ref 96–112)
Creatinine, Ser: 0.95 mg/dL (ref 0.50–1.35)
GFR calc Af Amer: >90 mL/min (ref >90)
GFR calc non Af Amer: >90 mL/min (ref >90)
Glucose, Bld: 104 mg/dL — ABNORMAL HIGH (ref 70–99)
Potassium: 3.9 mmol/L (ref 3.5–5.1)
Sodium: 133 mmol/L — ABNORMAL LOW (ref 135–145)
Total Bilirubin: 0.2 mg/dL — ABNORMAL LOW (ref 0.3–1.2)
Total Protein: 7.5 g/dL (ref 6.0–8.3)

## 2014-09-11 LAB — URINALYSIS, ROUTINE W REFLEX MICROSCOPIC
Bilirubin Urine: NEGATIVE
Glucose, UA: NEGATIVE mg/dL
Hgb urine dipstick: NEGATIVE
Ketones, ur: NEGATIVE mg/dL
Leukocytes, UA: NEGATIVE
Nitrite: NEGATIVE
Protein, ur: NEGATIVE mg/dL
Specific Gravity, Urine: 1.01 (ref 1.005–1.030)
Urobilinogen, UA: 0.2 mg/dL (ref 0.0–1.0)
pH: 7 (ref 5.0–8.0)

## 2014-09-11 LAB — CBC WITH DIFFERENTIAL/PLATELET
Basophils Absolute: 0 10*3/uL (ref 0.0–0.1)
Basophils Relative: 1 % (ref 0–1)
Eosinophils Absolute: 0.2 10*3/uL (ref 0.0–0.7)
Eosinophils Relative: 3 % (ref 0–5)
HCT: 40 % (ref 39.0–52.0)
Hemoglobin: 12.6 g/dL — ABNORMAL LOW (ref 13.0–17.0)
Lymphocytes Relative: 24 % (ref 12–46)
Lymphs Abs: 1.4 10*3/uL (ref 0.7–4.0)
MCH: 29.2 pg (ref 26.0–34.0)
MCHC: 31.5 g/dL (ref 30.0–36.0)
MCV: 92.6 fL (ref 78.0–100.0)
Monocytes Absolute: 0.6 10*3/uL (ref 0.1–1.0)
Monocytes Relative: 10 % (ref 3–12)
Neutro Abs: 3.7 10*3/uL (ref 1.7–7.7)
Neutrophils Relative %: 62 % (ref 43–77)
Platelets: 282 10*3/uL (ref 150–400)
RBC: 4.32 MIL/uL (ref 4.22–5.81)
RDW: 13.2 % (ref 11.5–15.5)
WBC: 5.9 10*3/uL (ref 4.0–10.5)

## 2014-09-11 LAB — LIPASE, BLOOD: Lipase: 29 U/L (ref 11–59)

## 2014-09-11 LAB — I-STAT TROPONIN, ED: Troponin i, poc: 0 ng/mL (ref 0.00–0.08)

## 2014-09-11 MED ORDER — NAPROXEN 250 MG PO TABS: 250.0000 mg | ORAL_TABLET | Freq: Two times a day (BID) | ORAL | Status: DC

## 2014-09-11 MED ORDER — ACETAMINOPHEN 500 MG PO TABS
500.0000 mg | ORAL_TABLET | Freq: Four times a day (QID) | ORAL | Status: DC | PRN
Start: 2014-09-11 — End: 2015-02-20

## 2014-09-11 NOTE — ED Notes (Signed)
Pt to the restroom with caretaker in attempt to collect urine sample.

## 2014-09-11 NOTE — ED Provider Notes (Signed)
CSN: [id number]     Arrival date & time 09/11/14  1657 History   First MD Initiated Contact with Patient 09/11/14 1958     Chief Complaint  Patient presents with  . Abdominal Pain   Level V caveat: Mental retardation, TBI  [name] is a 49 y.o. 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 Patient is complaining of stabbing pain that is worse when he moves or takes a deep breath. The caretaker reports he started complaining of this pain earlier today. Contrary to the nursing note he was not complaining of any abdominal pain and his pain was always in his right ribs. The patient denies coughing. He reports pain with deep inspiration, but not shortness of breath. He denies falls or injury to his chest. The caretaker also denies injury or falls. He denies any fevers or coughing. The patient denies abdominal pain or vomiting.   (Consider location/radiation/quality/duration/timing/severity/associated sxs/prior Treatment) HPI  Past Medical History  Diagnosis Date  . 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 Depression   . 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 Closed head injury   . Seizures   . Peptic ulcer disease   . Esophageal ulcer   . Malnutrition   . Constipation   . BPH (benign prostatic hyperplasia)   . Smoker    Past Surgical History  Procedure Laterality Date  . Gastrectomy    . Esophagogastroduodenoscopy  06/17/2011    Procedure: ESOPHAGOGASTRODUODENOSCOPY (EGD);  Surgeon: Wonda Horner, MD;  Location: [facility];  Service: Endoscopy;  Laterality: N/A;   History reviewed. No pertinent family history. History  Substance Use Topics  . Smoking status: Current Every Day Smoker -- 0.50 packs/day  . Smokeless tobacco: Former Systems developer    Quit date: 06/08/2001  . Alcohol Use: No    Review of Systems  Unable to  perform ROS: Other  Constitutional: Negative for fever.  Cardiovascular: Positive for chest pain.  Gastrointestinal: Negative for nausea, vomiting and abdominal pain.  Genitourinary: Negative for dysuria.  Musculoskeletal: Negative for back pain.  Skin: Negative for rash.      Allergies  Pineapple  Home Medications   Prior to Admission medications   Medication Sig Start Date End Date Taking? Authorizing Provider  carbamazepine (CARBATROL) 300 MG 12 hr capsule Take 300 mg by mouth 2 (two) times daily.   Yes Historical Provider, MD  clonazePAM (KLONOPIN) 0.5 MG tablet TAKE ONE TABLET BY MOUTH TWICE DAILY AS NEEDED Patient taking differently: TAKE ONE TABLET BY MOUTH TWICE DAILY AS NEEDED FOR ANXIETY 10/10/13  Yes Kathrynn Ducking, MD  escitalopram (LEXAPRO) 10 MG tablet Take 10 mg by mouth daily.   Yes Historical Provider, MD  LamoTRIgine 100 MG TB24 Take 1 tablet by mouth every morning.   Yes Historical Provider, MD  LamoTRIgine XR 200 MG TB24 Take 1 tablet by mouth at bedtime.   Yes Historical Provider, MD  pantoprazole (PROTONIX) 40 MG tablet Take 40 mg by mouth daily.   Yes Historical Provider, MD  QUEtiapine (SEROQUEL XR) 200 MG 24 hr tablet Take 200 mg by mouth at bedtime.   Yes Historical Provider, MD  Tamsulosin HCl (FLOMAX) 0.4 MG CAPS Take 0.4 mg by mouth at bedtime.    Yes Historical Provider, MD  traZODone (DESYREL) 50 MG tablet Take 50 mg by mouth at bedtime.  Yes Historical Provider, MD  zolpidem (AMBIEN) 10 MG tablet Take 10 mg by mouth at bedtime.   Yes Historical Provider, MD  carbamazepine (CARBATROL) 300 MG 12 hr capsule Take 1 capsule (300 mg total) by mouth 2 (two) times daily. Patient not taking: Reported on 09/11/2014 02/16/14   Otilio Jefferson, NP  LamoTRIgine 100 MG TB24 Take 1 tablet (100 mg total) by mouth daily. Patient not taking: Reported on 09/11/2014 02/16/14   Otilio Jefferson, NP  LamoTRIgine XR 200 MG TB24 Take 1 tablet (200 mg total) by mouth  daily. Patient not taking: Reported on 09/11/2014 02/16/14   Otilio Jefferson, NP  naproxen (NAPROSYN) 250 MG tablet Take 1 tablet (250 mg total) by mouth 2 (two) times daily with a meal. 09/11/14   Waynetta Pean, PA-C  pantoprazole (PROTONIX) 40 MG tablet Take 1 tablet (40 mg total) by mouth daily. 08/27/11 08/26/12  Wilford Corner, MD   BP 113/72 mmHg  Pulse 74  Temp(Src) 98.3 F (36.8 C) (Oral)  Resp 18  SpO2 94% Physical Exam  Constitutional: He appears well-developed and well-nourished. No distress.  HENT:  Head: Normocephalic and atraumatic.  Mouth/Throat: Oropharynx is clear and moist. No oropharyngeal exudate.  Eyes: Conjunctivae are normal. Pupils are equal, round, and reactive to light. Right eye exhibits no discharge. Left eye exhibits no discharge.  Neck: Normal range of motion. Neck supple. No JVD present. No tracheal deviation present.  Cardiovascular: Normal rate, regular rhythm, normal heart sounds and intact distal pulses.  Exam reveals no gallop and no friction rub.   No murmur heard. Pulmonary/Chest: Effort normal and breath sounds normal. No respiratory distress. He has no wheezes. He has no rales. He exhibits tenderness.  Right lateral chest wall is tender to palpation and reproduces his pain. No overlying deformity, erythema or edema.  Abdominal: Soft. Bowel sounds are normal. He exhibits no distension and no mass. There is no tenderness. There is no rebound and no guarding.  Abdomen is soft and nontender to palpation. Bowel sounds are present.  Musculoskeletal: He exhibits no edema or tenderness.  No lower extremity edema or tenderness noted.  Lymphadenopathy:    He has no cervical adenopathy.  Neurological: He is alert. Coordination normal.  Skin: Skin is warm and dry. No rash noted. He is not diaphoretic. No erythema. No pallor.  Psychiatric: He has a normal mood and affect. His behavior is normal.  Nursing note and vitals reviewed.   ED Course  Procedures  (including critical care time) Labs Review Labs Reviewed  CBC WITH DIFFERENTIAL/PLATELET - Abnormal; Notable for the following:    Hemoglobin 12.6 (*)    All other components within normal limits  COMPREHENSIVE METABOLIC PANEL - Abnormal; Notable for the following:    Sodium 133 (*)    Glucose, Bld 104 (*)    Total Bilirubin 0.2 (*)    All other components within normal limits  LIPASE, BLOOD  URINALYSIS, ROUTINE W REFLEX MICROSCOPIC  I-STAT TROPOININ, ED    Imaging Review Dg Chest 2 View  09/11/2014   CLINICAL DATA:  Right-sided chest pain. Shortness breath. Symptoms began today.  EXAM: CHEST  2 VIEW  COMPARISON:  12/10/2010  FINDINGS: Heart size is normal. Mediastinal shadows are normal. The lungs are clear. Old healed rib fractures on the left. Old healed right humeral fracture.  IMPRESSION: No active cardiopulmonary disease.   Electronically Signed   By: Nelson Chimes M.D.   On: 09/11/2014 21:35     EKG  Interpretation   Date/Time:  Monday September 11 2014 20:53:11 EDT Ventricular Rate:  69 PR Interval:  185 QRS Duration: 103 QT Interval:  373 QTC Calculation: 399 R Axis:   78 Text Interpretation:  Sinus rhythm Consider left atrial enlargement ST  elev, probable normal early repol pattern Baseline wander in lead(s) III  aVL V3 V4 Confirmed by Wilson Singer  MD, STEPHEN ([id number]) on 09/11/2014 10:41:07 PM      Filed Vitals:   09/11/14 1723 09/11/14 2055  BP: 101/60 113/72  Pulse: 78 74  Temp: 98.3 F (36.8 C)   TempSrc: Oral   Resp: 18 18  SpO2: 98% 94%     MDM   Meds given in ED:  Medications - No data to display  Discharge Medication List as of 09/11/2014 10:46 PM    START taking these medications   Details  acetaminophen (TYLENOL) 500 MG tablet Take 1 tablet (500 mg total) by mouth every 6 (six) hours as needed., Starting 09/11/2014, Until Discontinued, Print        Final diagnoses:  Right-sided chest wall pain  Chest pain, unspecified chest pain type    This is  a 49 y.o. 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 Patient is complaining of stabbing pain that is worse when he moves or takes a deep breath.  This is contrary to the nursing home which reports right-sided abdominal pain. The caretaker reports this is some misunderstanding as he has not been complaining of any abdominal pain. On exam the patient is afebrile and nontoxic appearing. Heart and lungs are clear. His right lateral chest is tender to palpation and reproduces his chest pain. He has a negative troponin. Urinalysis is negative for infection. CBC and CMP are unremarkable. Lipase is within normal limits. Chest x-ray is negative for active cardiopulmonary disease.  Patient is to be discharged with recommendation to follow up with PCP in regards to today's hospital visit. Chest pain is not likely of cardiac or pulmonary etiology d/t presentation, perc negative, VSS, no tracheal deviation, no JVD or new murmur, RRR, breath sounds equal bilaterally, negative troponin, and negative CXR. His oxygen saturation is 97% on room air prior to discharge. The patient is not tachypneic, tachycardic or hypoxic. Patient given prescription for Tylenol and advised to follow-up with his primary care provider. I advised the patient to follow-up with their primary care provider this week. I advised the patient and caretaker to return to the emergency department with new or worsening symptoms or new concerns. The patient's caretaker verbalized understanding and agreement with plan.    This patient was discussed with Dr. Wilson Singer who agrees with assessment and plan.    Waynetta Pean, PA-C 09/12/14 [id number]  Virgel Manifold, MD 09/12/14 [phone]

## 2014-09-11 NOTE — ED Notes (Signed)
Explained discharge and prescription instructions to care giver. They stated no other questions at this time, pt getting dressed

## 2014-09-11 NOTE — ED Notes (Signed)
Pt with Hx of MR c/o sharp right sided abdominal pain, SOB, onset this morning. Pt brought in by caretaker.

## 2014-09-11 NOTE — Discharge Instructions (Signed)
Chest Pain (Nonspecific) °It is often hard to give a specific diagnosis for the cause of chest pain. There is always a chance that your pain could be related to something serious, such as a heart attack or a blood clot in the lungs. You need to follow up with your health care provider for further evaluation. °CAUSES  °· Heartburn. °· Pneumonia or bronchitis. °· Anxiety or stress. °· Inflammation around your heart (pericarditis) or lung (pleuritis or pleurisy). °· A blood clot in the lung. °· A collapsed lung (pneumothorax). It can develop suddenly on its own (spontaneous pneumothorax) or from trauma to the chest. °· Shingles infection (herpes zoster virus). °The chest wall is composed of bones, muscles, and cartilage. Any of these can be the source of the pain. °· The bones can be bruised by injury. °· The muscles or cartilage can be strained by coughing or overwork. °· The cartilage can be affected by inflammation and become sore (costochondritis). °DIAGNOSIS  °Lab tests or other studies may be needed to find the cause of your pain. Your health care provider may have you take a test called an ambulatory electrocardiogram (ECG). An ECG records your heartbeat patterns over a 24-hour period. You may also have other tests, such as: °· Transthoracic echocardiogram (TTE). During echocardiography, sound waves are used to evaluate how blood flows through your heart. °· Transesophageal echocardiogram (TEE). °· Cardiac monitoring. This allows your health care provider to monitor your heart rate and rhythm in real time. °· Holter monitor. This is a portable device that records your heartbeat and can help diagnose heart arrhythmias. It allows your health care provider to track your heart activity for several days, if needed. °· Stress tests by exercise or by giving medicine that makes the heart beat faster. °TREATMENT  °· Treatment depends on what may be causing your chest pain. Treatment may include: °· Acid blockers for  heartburn. °· Anti-inflammatory medicine. °· Pain medicine for inflammatory conditions. °· Antibiotics if an infection is present. °· You may be advised to change lifestyle habits. This includes stopping smoking and avoiding alcohol, caffeine, and chocolate. °· You may be advised to keep your head raised (elevated) when sleeping. This reduces the chance of acid going backward from your stomach into your esophagus. °Most of the time, nonspecific chest pain will improve within 2-3 days with rest and mild pain medicine.  °HOME CARE INSTRUCTIONS  °· If antibiotics were prescribed, take them as directed. Finish them even if you start to feel better. °· For the next few days, avoid physical activities that bring on chest pain. Continue physical activities as directed. °· Do not use any tobacco products, including cigarettes, chewing tobacco, or electronic cigarettes. °· Avoid drinking alcohol. °· Only take medicine as directed by your health care provider. °· Follow your health care provider's suggestions for further testing if your chest pain does not go away. °· Keep any follow-up appointments you made. If you do not go to an appointment, you could develop lasting (chronic) problems with pain. If there is any problem keeping an appointment, call to reschedule. °SEEK MEDICAL CARE IF:  °· Your chest pain does not go away, even after treatment. °· You have a rash with blisters on your chest. °· You have a fever. °SEEK IMMEDIATE MEDICAL CARE IF:  °· You have increased chest pain or pain that spreads to your arm, neck, jaw, back, or abdomen. °· You have shortness of breath. °· You have an increasing cough, or you cough   up blood. °· You have severe back or abdominal pain. °· You feel nauseous or vomit. °· You have severe weakness. °· You faint. °· You have chills. °This is an emergency. Do not wait to see if the pain will go away. Get medical help at once. Call your local emergency services (911 in [place] Do not drive  yourself to the hospital. °MAKE SURE YOU:  °· Understand these instructions. °· Will watch your condition. °· Will get help right away if you are not doing well or get worse. °Document Released: 02/19/2005 Document Revised: 05/17/2013 Document Reviewed: 12/16/2007 °[employer]® Patient Information ©2015 [employer]. This information is not intended to replace advice given to you by your health care provider. Make sure you discuss any questions you have with your health care provider. ° °Chest Wall Pain °Chest wall pain is pain in or around the bones and muscles of your chest. It may take up to 6 weeks to get better. It may take longer if you must stay physically active in your work and activities.  °CAUSES  °Chest wall pain may happen on its own. However, it may be caused by: °· A viral illness like the flu. °· Injury. °· Coughing. °· Exercise. °· Arthritis. °· Fibromyalgia. °· Shingles. °HOME CARE INSTRUCTIONS  °· Avoid overtiring physical activity. Try not to strain or perform activities that cause pain. This includes any activities using your chest or your abdominal and side muscles, especially if heavy weights are used. °· Put ice on the sore area. °¨ Put ice in a plastic bag. °¨ Place a towel between your skin and the bag. °¨ Leave the ice on for 15-20 minutes per hour while awake for the first 2 days. °· Only take over-the-counter or prescription medicines for pain, discomfort, or fever as directed by your caregiver. °SEEK IMMEDIATE MEDICAL CARE IF:  °· Your pain increases, or you are very uncomfortable. °· You have a fever. °· Your chest pain becomes worse. °· You have new, unexplained symptoms. °· You have nausea or vomiting. °· You feel sweaty or lightheaded. °· You have a cough with phlegm (sputum), or you cough up blood. °MAKE SURE YOU:  °· Understand these instructions. °· Will watch your condition. °· Will get help right away if you are not doing well or get worse. °Document Released: 05/12/2005 Document  Revised: 08/04/2011 Document Reviewed: 01/06/2011 °[employer]® Patient Information ©2015 [employer]. This information is not intended to replace advice given to you by your health care provider. Make sure you discuss any questions you have with your health care provider. ° °

## 2014-10-30 ENCOUNTER — Ambulatory Visit: Payer: Medicare Other | Attending: Family Medicine | Admitting: Physical Therapy

## 2014-10-30 DIAGNOSIS — R269 Unspecified abnormalities of gait and mobility: Secondary | ICD-10-CM | POA: Insufficient documentation

## 2014-10-30 DIAGNOSIS — S0990XS Unspecified injury of head, sequela: Secondary | ICD-10-CM | POA: Insufficient documentation

## 2014-10-30 DIAGNOSIS — R2689 Other abnormalities of gait and mobility: Secondary | ICD-10-CM

## 2014-10-30 DIAGNOSIS — R260 Ataxic gait: Secondary | ICD-10-CM | POA: Insufficient documentation

## 2014-10-30 DIAGNOSIS — R2681 Unsteadiness on feet: Secondary | ICD-10-CM | POA: Diagnosis not present
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
## 2014-11-06 ENCOUNTER — Ambulatory Visit: Payer: Medicare Other | Admitting: Physical Therapy

## 2014-11-06 DIAGNOSIS — S0990XS Unspecified injury of head, sequela: Secondary | ICD-10-CM

## 2014-11-06 DIAGNOSIS — R2689 Other abnormalities of gait and mobility: Secondary | ICD-10-CM

## 2014-11-06 DIAGNOSIS — R269 Unspecified abnormalities of gait and mobility: Secondary | ICD-10-CM

## 2014-11-06 DIAGNOSIS — R260 Ataxic gait: Secondary | ICD-10-CM

## 2014-11-06 NOTE — Therapy (Signed)
[facility] [address] [place], [place], [address] Phone: [phone]   Fax:  [phone]  Physical Therapy Treatment  Patient Details  Name: [name] MRN: [id number] Date of Birth: Jul 09, 1965 Referring Provider:  Saralyn Pilar, MD  Encounter Date: 11/06/2014      PT End of Session - 11/06/14 1159    Visit Number 2   Number of Visits 9   Date for PT Re-Evaluation 12/29/14   Authorization Type Medicare/Medicaid-G-code required every 10th visit   PT Start Time 1101   PT Stop Time 1150   PT Time Calculation (min) 49 min   Equipment Utilized During Treatment Gait belt   Activity Tolerance Patient tolerated treatment well   Behavior During Therapy [facility] for tasks assessed/performed      Past Medical History  Diagnosis Date  . 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 Depression   . 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 Closed head injury   . Seizures   . Peptic ulcer disease   . Esophageal ulcer   . Malnutrition   . Constipation   . BPH (benign prostatic hyperplasia)   . Smoker     Past Surgical History  Procedure Laterality Date  . Gastrectomy    . Esophagogastroduodenoscopy  06/17/2011    Procedure: ESOPHAGOGASTRODUODENOSCOPY (EGD);  Surgeon: Wonda Horner, MD;  Location: [facility];  Service: Endoscopy;  Laterality: N/A;    There were no vitals filed for this visit.  Visit Diagnosis:  Abnormality of gait  Ataxic gait  Balance disturbance due to old head injury      Subjective Assessment - 11/06/14 1105    Subjective Accompanied by caregiver, [name] (sounds like "show"), who reports that he is able to assist pt with exercises, walking twice per week during the [employer] day program. No falls, no new issues to report. Caregiver unsure as to whether it would be safe for male caregivers (at ALF)  to assist with standing/gait.   Patient is accompained by:  --  Caregiver, [name]   Currently in Pain? No/denies           Treatment   Therapeutic Exercises: - Initiated HEP; see Patient Instructed section for details. Explained, demonstrated, and provided cueing for bridging, Min to Mod A for all standing exercises with effective return demonstration from caregiver.   Therapeutic Activities: - W/c mobility x25' total over level surfaces with LE's only for functional LE strengthening; manual repositioning of RLE required to correct excessive R hip ER. - Squat pivot transfers from w/c <> mat table with min to mod A, max cueing for locking w/c brakes, management of safety belt. - Supine <> sit with supervision, increased time.  Gait Training: - Gait x20' then x41' over level surfaces with bariatric RW (for increased stability) requiring min to mod A for stability/balance, tactile cueing for lateral weight shifting, manual stabilization/correction of RW advancement during nonlinear gait; frequent cueing required for upright posture with ineffective carrover, for safe proximity of RW with verbal understanding but inconsistent within-session carryover.         PT Education - 11/06/14 1217    Education provided Yes   Education Details Initiated HEP. Recommended graded increase in walking distance at [facility]. Strongly recommend that pt only perform HEP, walking with hands-on assist of [name].    Person(s) Educated Patient;Caregiver(s)   Methods Handout;Demonstration;Explanation;Tactile cues;Verbal cues   Comprehension Verbalized understanding;Returned demonstration  PT Long Term Goals - 11/06/14 1219    PT LONG TERM GOAL #1   Title Pt/caregiver will perform HEP for improved trunk/lower extremity strength, balance, standing tolerance, and gait.  Target 11/29/14   Time 4   Period Weeks   Status On-going   PT LONG TERM GOAL #2   Title Pt will perform sit<>stand transfers with min guard assistance for improved safety with transfers.   Time  4   Period Weeks   Status On-going   PT LONG TERM GOAL #3   Title Pt will perform standing activities at least 5 minutes at counter with min guard assistance and UE support for improved standing tolerance.   Time 4   Period Weeks   Status On-going   PT LONG TERM GOAL #4   Title Pt will ambulate at least 50 ft in 3 minute walk for improved gait efficiency and safety.   Baseline ambulates 33 ft. in 3 minutes, 20 seconds   Time 4   Period Weeks   Status On-going   PT LONG TERM GOAL #5   Title Pt/caregiver will verbalize understanding of community fitness options upon D/C from PT.   Time 4   Period Weeks   Status On-going               Plan - 11/06/14 1212    Clinical Impression Statement Although pt did require frequent redirection during this session, he was very pleasant and participatory. HEP initiated with caregiver; paper handout provided. Home exercises focused on standing tolerance/balance, LE strengthening. Pt will continue to benefit from skilled PT for LE strengthening/nbalance to increase gait stability, walking tolerance.   PT Next Visit Plan Ask pt/caregiver if HEP length/difficulty seems appropriate for [employer] day program. Modify HEP as needed. Consider providing bed-level HEP in future, as [name] unsure as to whether it would be safe for male caregivers (at ALF)  to assist with standing/gait.   Consulted and Agree with Plan of Care Patient;Family member/caregiver   Family Member Consulted [name]        Problem List Patient Active Problem List   Diagnosis Date Noted  . 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 Spastic hemiplegia affecting dominant side 02/15/2013  . Iron deficiency anemia due to chronic blood loss 06/09/2011  . 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 GASTROENTERITIS 07/31/2009  . LIPOMA  06/07/2009  . OSTEOPOROSIS 03/15/2008  . FUNGAL DERMATITIS 03/07/2008  . BENIGN PROSTATIC HYPERTROPHY, WITH URINARY OBSTRUCTION 03/07/2008  . 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 Other urinary incontinence 03/07/2008  . GAIT DISTURBANCE 04/02/2007  . DEPRESSION 03/28/2007  . EXOTROPIA 03/28/2007  . PEPTIC ULCER DISEASE, HX OF 03/28/2007  . GERD 02/24/2007  . NASAL FRACTURE 09/21/2006  . VOMITING, PERSISTENT, HX OF 12/24/2005  . LIVER FUNCTION TESTS, ABNORMAL, HX OF 06/19/2005  . 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 HX SEVERE CLOSED HEAD INJURY 05/26/1984  . DYSPHAGIA DUE TO BRAIN INJURY, EPISODIC SILENT ASPIRATION 05/26/1984    Billie Ruddy, PT, DPT [facility] [address] [place], [place], [address] Phone: [phone]   Fax:  [phone] 11/06/2014, 12:19 PM

## 2014-11-06 NOTE — Patient Instructions (Addendum)
Bridge   Lying on back, legs bent 90, feet flat on floor. Press up hips and torso. Hold for 2 seconds, then slowly lower.  Do 15 repetitions. Repeat two more sets for a total of 3 sets of 15 repetitions per day.  "I love a Database administrator   Stand with one hand on support, other hand holding onto [name] with your other hand. Make sure you have a safety belt on.  March in place with high knees 20 times. Make sure you're standing up tall.  Do 3 sets total.  http://gt2.exer.us/344   ANKLE: Dorsiflexion - Standing   Stand with upright posture. Raise toes of both feet up at same time. 15 reps per set, 3 times. Hold onto a support.  ANKLE: Plantarflexion, Bilateral - Standing   Stand with upright posture. Raise heels up as high as possible. 15 reps per set, 3 sets per day.  Copyright  VHI. All rights reserved.

## 2014-11-08 ENCOUNTER — Ambulatory Visit: Payer: Medicare Other | Admitting: Physical Therapy

## 2014-11-08 DIAGNOSIS — R269 Unspecified abnormalities of gait and mobility: Secondary | ICD-10-CM | POA: Diagnosis not present

## 2014-11-08 NOTE — Therapy (Signed)
[facility] [address] [place], [place], [address] Phone: [phone]   Fax:  [phone]  Physical Therapy Treatment  Patient Details  Name: [name] MRN: [id number] Date of Birth: [birth year] Referring Provider:  Saralyn Pilar, MD  Encounter Date: 11/08/2014      PT End of Session - 11/08/14 1344    Visit Number 3   Number of Visits 9   Date for PT Re-Evaluation 12/29/14   Authorization Type Medicare/Medicaid-G-code required every 10th visit   PT Start Time 0934   PT Stop Time 1015   PT Time Calculation (min) 41 min   Equipment Utilized During Treatment Gait belt   Activity Tolerance Patient tolerated treatment well   Behavior During Therapy [name] for tasks assessed/performed      Past Medical History  Diagnosis Date  . 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 Depression   . 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 Closed head injury   . Seizures   . Peptic ulcer disease   . Esophageal ulcer   . Malnutrition   . Constipation   . BPH (benign prostatic hyperplasia)   . Smoker     Past Surgical History  Procedure Laterality Date  . Gastrectomy    . Esophagogastroduodenoscopy  06/17/2011    Procedure: ESOPHAGOGASTRODUODENOSCOPY (EGD);  Surgeon: Wonda Horner, MD;  Location: [facility];  Service: Endoscopy;  Laterality: N/A;    There were no vitals filed for this visit.  Visit Diagnosis:  Abnormality of gait      Subjective Assessment - 11/08/14 1343    Subjective "This feel great" [id number].  "I could do this everyday."  Pt denies falls or changes since last visit.   Patient is accompained by: --  caregiver-[name]   Patient Stated Goals Pt's goal is to walk again   Currently in Pain? No/denies      Gait in parallel bars with bil UE support using mirrors for visual cues to promote upright posture.  Significant lean on UE's and trunk flexion.   Requires constant verbal and tactile cues as well as min assist. Gait with RW x 50' x 1 and 20' x 1.  Pt needing moderate assist with significant lean with UE's, poor bil foot clearance and placement, posterior lean/push at times.  Needs constant verbal and tactile cues as well.  UE's fatigued quickly. Supine on edge of mat with LLE off mat for hip flexor stretch x 2 minutes. Prone in PWR! Up position (with moderate assist to get in this position) x 1 minute "rest" in this position then 10 reps of PWR! Up. Min-moderate assist for stand/squat pivot transfer to/from mat.        PT Long Term Goals - 11/06/14 1219    PT LONG TERM GOAL #1   Title Pt/caregiver will perform HEP for improved trunk/lower extremity strength, balance, standing tolerance, and gait.  Target 11/29/14   Time 4   Period Weeks   Status On-going   PT LONG TERM GOAL #2   Title Pt will perform sit<>stand transfers with min guard assistance for improved safety with transfers.   Time 4   Period Weeks   Status On-going   PT LONG TERM GOAL #3   Title Pt will perform standing activities at least 5 minutes at counter with min guard assistance and UE support for improved standing tolerance.   Time 4   Period Weeks   Status On-going  PT LONG TERM GOAL #4   Title Pt will ambulate at least 50 ft in 3 minute walk for improved gait efficiency and safety.   Baseline ambulates 33 ft. in 3 minutes, 20 seconds   Time 4   Period Weeks   Status On-going   PT LONG TERM GOAL #5   Title Pt/caregiver will verbalize understanding of community fitness options upon D/C from PT.   Time 4   Period Weeks   Status On-going               Plan - 11/08/14 1345    Clinical Impression Statement Pt continues to require significant assist for ambulation with increased trunk flexion and leaning on UE's to support self during gait.  Continue PT per POC.   Pt will benefit from skilled therapeutic intervention in order to improve on the  following deficits Abnormal gait;Decreased balance;Decreased mobility;Decreased endurance;Decreased safety awareness;Decreased strength;Difficulty walking;Impaired tone;Postural dysfunction   Rehab Potential Fair   Clinical Impairments Affecting Rehab Potential history of head injury   PT Frequency 2x / week   PT Duration 4 weeks  plus eval   PT Treatment/Interventions ADLs/Self Care Home Management;Gait training;Neuromuscular re-education;Balance training;Therapeutic exercise;Therapeutic activities;Patient/family education   PT Next Visit Plan Continue stretching-try prone again at beginning of treatment, PWR! up for posture in sitting and standing or modified quadruped, gait.   Consulted and Agree with Plan of Care Patient;Family member/caregiver   Family Member Consulted [name]        Problem List Patient Active Problem List   Diagnosis Date Noted  . 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 Spastic hemiplegia affecting dominant side 02/15/2013  . Iron deficiency anemia due to chronic blood loss 06/09/2011  . 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 GASTROENTERITIS 07/31/2009  . LIPOMA 06/07/2009  . OSTEOPOROSIS 03/15/2008  . FUNGAL DERMATITIS 03/07/2008  . BENIGN PROSTATIC HYPERTROPHY, WITH URINARY OBSTRUCTION 03/07/2008  . 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 Other urinary incontinence 03/07/2008  . GAIT DISTURBANCE 04/02/2007  . DEPRESSION 03/28/2007  . EXOTROPIA 03/28/2007  . PEPTIC ULCER DISEASE, HX OF 03/28/2007  . GERD 02/24/2007  . NASAL FRACTURE 09/21/2006  . VOMITING, PERSISTENT, HX OF 12/24/2005  . LIVER FUNCTION TESTS, ABNORMAL, HX OF 06/19/2005  . 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 HX SEVERE CLOSED HEAD INJURY 05/26/1984  . DYSPHAGIA DUE TO BRAIN INJURY, EPISODIC SILENT ASPIRATION 05/26/1984    Narda Bonds 11/08/2014, 1:47 PM  [facility] [address] [place], [place], [address] Phone: [phone]   Fax:  [place], [place] 11/08/2014 1:48 PM Phone: [phone] Fax: [phone]

## 2014-11-14 ENCOUNTER — Ambulatory Visit: Payer: Medicare Other | Admitting: Physical Therapy

## 2014-11-14 DIAGNOSIS — R293 Abnormal posture: Secondary | ICD-10-CM

## 2014-11-14 DIAGNOSIS — R269 Unspecified abnormalities of gait and mobility: Secondary | ICD-10-CM

## 2014-11-14 DIAGNOSIS — R260 Ataxic gait: Secondary | ICD-10-CM

## 2014-11-14 NOTE — Therapy (Signed)
[facility] [address] [place], [place], [address] Phone: [phone]   Fax:  [phone]  Physical Therapy Treatment  Patient Details  Name: [name] MRN: [id number] Date of Birth: [birth year] Referring Provider:  Saralyn Pilar, MD  Encounter Date: 11/14/2014      PT End of Session - 11/14/14 2156    Visit Number 4   Number of Visits 9   Date for PT Re-Evaluation 12/29/14   Authorization Type Medicare/Medicaid-G-code required every 10th visit   PT Start Time 1150   PT Stop Time 1230   PT Time Calculation (min) 40 min   Equipment Utilized During Treatment Gait belt   Activity Tolerance Patient tolerated treatment well      Past Medical History  Diagnosis Date  . 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 Depression   . 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 Closed head injury   . Seizures   . Peptic ulcer disease   . Esophageal ulcer   . Malnutrition   . Constipation   . BPH (benign prostatic hyperplasia)   . Smoker     Past Surgical History  Procedure Laterality Date  . Gastrectomy    . Esophagogastroduodenoscopy  06/17/2011    Procedure: ESOPHAGOGASTRODUODENOSCOPY (EGD);  Surgeon: Wonda Horner, MD;  Location: [facility];  Service: Endoscopy;  Laterality: N/A;    There were no vitals filed for this visit.  Visit Diagnosis:  Abnormality of gait  Ataxic gait  Posture abnormality      Subjective Assessment - 11/14/14 1153    Subjective No pain today.  No changes since last visit.   Currently in Pain? No/denies      Therapeutic Exercise: -Supine hip flexor stretch off edge of mat:  60 seconds each side.  Prone exercise:  Prone to prone on forearms (modified PWR! Up prone position), x 10 reps, with verbal and tactile cues, for improved postural strength. -Quadruped over red therapy ball with UE lifts 5 reps each side, therapist providing min  assist for stabilization on ball. -Seated attempts at forward lean<>upright posture (modified PWR! Up in sitting), x 10 reps with therapist min guard assistance and cues for grading motion to avoid excess forward lean. -Seated scapular squeezes x 5 reps with tactile cues. -Seated rhythmic stabilization in anterior/posterior direction, x 10 reps for improved abdominal activation and core strength, for improved upright posture.  -Seated wheelchair propulsion with exaggerated LAQs and heel digs for improved lower extremity strength.  Gait:  Gait x 55 ft using rolling walker, with mod assist of therapist, and caregiver present in case of additional assistance needed.  Pt requires multiple standing rest breaks and verbal/visual (mirror)/tactile cues for upright posture.  Pt tends to have forward lean towards walker during gait, with wide base of support.  PT provides weightshifting cues at hips to try to get more consistent step through pattern.                               PT Long Term Goals - 11/06/14 1219    PT LONG TERM GOAL #1   Title Pt/caregiver will perform HEP for improved trunk/lower extremity strength, balance, standing tolerance, and gait.  Target 11/29/14   Time 4   Period Weeks   Status On-going   PT LONG TERM GOAL #2   Title Pt will perform sit<>stand transfers with min  guard assistance for improved safety with transfers.   Time 4   Period Weeks   Status On-going   PT LONG TERM GOAL #3   Title Pt will perform standing activities at least 5 minutes at counter with min guard assistance and UE support for improved standing tolerance.   Time 4   Period Weeks   Status On-going   PT LONG TERM GOAL #4   Title Pt will ambulate at least 50 ft in 3 minute walk for improved gait efficiency and safety.   Baseline ambulates 33 ft. in 3 minutes, 20 seconds   Time 4   Period Weeks   Status On-going   PT LONG TERM GOAL #5   Title Pt/caregiver will verbalize  understanding of community fitness options upon D/C from PT.   Time 4   Period Weeks   Status On-going               Plan - 11/14/14 2156    Clinical Impression Statement Pt is willing to attempt prone and quadruped positions in therapy session, but he needs verbal and tactile cues for proper positioning and technique.  Pt continues to have increased forward lean and trunk flexion onto walker with gait.  Pt will continue to benefit from further skilled PT to further update HEP for postural strengthening and stretching activities.   Pt will benefit from skilled therapeutic intervention in order to improve on the following deficits Abnormal gait;Decreased balance;Decreased mobility;Decreased endurance;Decreased safety awareness;Decreased strength;Difficulty walking;Impaired tone;Postural dysfunction   Rehab Potential Fair   Clinical Impairments Affecting Rehab Potential history of head injury   PT Frequency 2x / week   PT Duration 4 weeks  plus eval   PT Treatment/Interventions ADLs/Self Care Home Management;Gait training;Neuromuscular re-education;Balance training;Therapeutic exercise;Therapeutic activities;Patient/family education   PT Next Visit Plan Continue stretching-try prone again at beginning of treatment (provide pictures for HEP); gait and standing activities.   Consulted and Agree with Plan of Care Patient;Family member/caregiver   Family Member Consulted [name]        Problem List Patient Active Problem List   Diagnosis Date Noted  . 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 Spastic hemiplegia affecting dominant side 02/15/2013  . Iron deficiency anemia due to chronic blood loss 06/09/2011  . 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 GASTROENTERITIS 07/31/2009  . LIPOMA 06/07/2009  . OSTEOPOROSIS 03/15/2008  . FUNGAL  DERMATITIS 03/07/2008  . BENIGN PROSTATIC HYPERTROPHY, WITH URINARY OBSTRUCTION 03/07/2008  . 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 Other urinary incontinence 03/07/2008  . GAIT DISTURBANCE 04/02/2007  . DEPRESSION 03/28/2007  . EXOTROPIA 03/28/2007  . PEPTIC ULCER DISEASE, HX OF 03/28/2007  . GERD 02/24/2007  . NASAL FRACTURE 09/21/2006  . VOMITING, PERSISTENT, HX OF 12/24/2005  . LIVER FUNCTION TESTS, ABNORMAL, HX OF 06/19/2005  . 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 HX SEVERE CLOSED HEAD INJURY 05/26/1984  . DYSPHAGIA DUE TO BRAIN INJURY, EPISODIC SILENT ASPIRATION 05/26/1984    Ronne Stefanski W. 11/14/2014, 10:00 PM Frazier Butt., PT [facility] [address] [place], [place], [address] Phone: [phone]   Fax:  [phone]

## 2014-11-15 ENCOUNTER — Ambulatory Visit: Payer: Medicare Other | Admitting: Physical Therapy

## 2014-11-15 DIAGNOSIS — R293 Abnormal posture: Secondary | ICD-10-CM

## 2014-11-15 DIAGNOSIS — R269 Unspecified abnormalities of gait and mobility: Secondary | ICD-10-CM | POA: Diagnosis not present

## 2014-11-15 NOTE — Therapy (Signed)
[facility] [address] [place], [place], [address] Phone: [phone]   Fax:  [phone]  Physical Therapy Treatment  Patient Details  Name: [name] MRN: [id number] Date of Birth: Mar 29, 1966 Referring Provider:  Saralyn Pilar, MD  Encounter Date: 11/15/2014      PT End of Session - 11/15/14 2231    Visit Number 5   Number of Visits 9   Date for PT Re-Evaluation 12/29/14   Authorization Type Medicare/Medicaid-G-code required every 10th visit   PT Start Time 1022   PT Stop Time 1102   PT Time Calculation (min) 40 min   Equipment Utilized During Treatment Gait belt   Activity Tolerance Patient tolerated treatment well   Behavior During Therapy [facility] for tasks assessed/performed      Past Medical History  Diagnosis Date  . 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 Depression   . 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 Closed head injury   . Seizures   . Peptic ulcer disease   . Esophageal ulcer   . Malnutrition   . Constipation   . BPH (benign prostatic hyperplasia)   . Smoker     Past Surgical History  Procedure Laterality Date  . Gastrectomy    . Esophagogastroduodenoscopy  06/17/2011    Procedure: ESOPHAGOGASTRODUODENOSCOPY (EGD);  Surgeon: Wonda Horner, MD;  Location: [facility];  Service: Endoscopy;  Laterality: N/A;    There were no vitals filed for this visit.  Visit Diagnosis:  Posture abnormality      Subjective Assessment - 11/15/14 1025    Subjective Some pain in L knee today   Currently in Pain? Yes   Pain Score 4    Pain Location Knee   Pain Orientation Left   Pain Descriptors / Indicators Aching;Throbbing   Pain Type Acute pain   Pain Onset Today   Aggravating Factors  bending it too much aggravates   Pain Relieving Factors will try ice          Pt requires min assist for transitioning positions:  Sit<>prone>long  sit>sit edge of mat.  Sit<>stand with supervision.  Squat pivot transfer wheelchair>mat with min assist.  Prone position to prone on forearms (modified PWR! Up prone position), x 10 reps, with verbal and tactile cues, for improved postural strength. -Quadruped over red therapy ball with UE lifts 5 reps each side, therapist providing min assist for stabilization on ball.  Forward/backward rocking in quadruped over red therapy ball, with min assist for positioning.  -Seated scapular squeezes combined with best upright posture, x 10 reps.   Above exercises added to HEP   Short distance gait x 25 ft with RW, with mod assist with tactile and verbal cues for upright posture and for weigthshifting for step through pattern.            Self Care:  Instructed patient and caregiver in HEP progression and importance of performance.        PT Education - 11/15/14 2231    Education provided Yes   Education Details HEP   Person(s) Educated Patient;Caregiver(s)   Methods Explanation;Demonstration;Handout   Comprehension Verbalized understanding;Returned demonstration             PT Long Term Goals - 11/06/14 1219    PT LONG TERM GOAL #1   Title Pt/caregiver will perform HEP for improved trunk/lower extremity strength, balance, standing tolerance, and gait.  Target 11/29/14   Time  4   Period Weeks   Status On-going   PT LONG TERM GOAL #2   Title Pt will perform sit<>stand transfers with min guard assistance for improved safety with transfers.   Time 4   Period Weeks   Status On-going   PT LONG TERM GOAL #3   Title Pt will perform standing activities at least 5 minutes at counter with min guard assistance and UE support for improved standing tolerance.   Time 4   Period Weeks   Status On-going   PT LONG TERM GOAL #4   Title Pt will ambulate at least 50 ft in 3 minute walk for improved gait efficiency and safety.   Baseline ambulates 33 ft. in 3 minutes, 20 seconds   Time 4    Period Weeks   Status On-going   PT LONG TERM GOAL #5   Title Pt/caregiver will verbalize understanding of community fitness options upon D/C from PT.   Time 4   Period Weeks   Status On-going               Plan - 11/15/14 2232    Clinical Impression Statement Added to HEP for prone, quadruped exercises as well as supine hip flexor stretch and seated upright posture.   Pt will benefit from skilled therapeutic intervention in order to improve on the following deficits Abnormal gait;Decreased balance;Decreased mobility;Decreased endurance;Decreased safety awareness;Decreased strength;Difficulty walking;Impaired tone;Postural dysfunction   Rehab Potential Fair   Clinical Impairments Affecting Rehab Potential history of head injury   PT Frequency 2x / week   PT Duration 4 weeks  plus eval   PT Treatment/Interventions ADLs/Self Care Home Management;Gait training;Neuromuscular re-education;Balance training;Therapeutic exercise;Therapeutic activities;Patient/family education   PT Next Visit Plan Review HEP; begin checking goals.   Consulted and Agree with Plan of Care Patient;Family member/caregiver   Family Member Consulted [name]        Problem List Patient Active Problem List   Diagnosis Date Noted  . 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 Spastic hemiplegia affecting dominant side 02/15/2013  . Iron deficiency anemia due to chronic blood loss 06/09/2011  . 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 GASTROENTERITIS 07/31/2009  . LIPOMA 06/07/2009  . OSTEOPOROSIS 03/15/2008  . FUNGAL DERMATITIS 03/07/2008  . BENIGN PROSTATIC HYPERTROPHY, WITH URINARY OBSTRUCTION 03/07/2008  . 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 Other urinary incontinence 03/07/2008  . GAIT DISTURBANCE 04/02/2007  . DEPRESSION 03/28/2007  . EXOTROPIA 03/28/2007  .  PEPTIC ULCER DISEASE, HX OF 03/28/2007  . GERD 02/24/2007  . NASAL FRACTURE 09/21/2006  . VOMITING, PERSISTENT, HX OF 12/24/2005  . LIVER FUNCTION TESTS, ABNORMAL, HX OF 06/19/2005  . 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 HX SEVERE CLOSED HEAD INJURY 05/26/1984  . DYSPHAGIA DUE TO BRAIN INJURY, EPISODIC SILENT ASPIRATION 05/26/1984    Nastashia Gallo W. 11/15/2014, 10:35 PM  Frazier Butt., PT  [facility] [address] [place], [place], [address] Phone: [phone]   Fax:  [phone]

## 2014-11-15 NOTE — Patient Instructions (Addendum)
   HIP: Flexors - Supine   Lie on edge of surface. Place leg off the surface, allow knee to bend. Bring other knee toward chest. Hold _60__ seconds. _3__ reps per set, _1-2__ sets per day, ___ days per week Rest lowered foot on stool.  Copyright  VHI. All rights reserved.  Provided pictures for quadruped UE lifts over ball x 5 reps each Provided picture for prone>prone on elbows x 10 reps Provided picture for supine hip flexor stretch, 3 x 60 seconds each side

## 2014-11-22 ENCOUNTER — Ambulatory Visit: Payer: Medicare Other | Admitting: Physical Therapy

## 2014-11-22 DIAGNOSIS — R293 Abnormal posture: Secondary | ICD-10-CM

## 2014-11-22 DIAGNOSIS — R269 Unspecified abnormalities of gait and mobility: Secondary | ICD-10-CM | POA: Diagnosis not present

## 2014-11-23 NOTE — Therapy (Signed)
[facility] [address] [place], [place], [address] Phone: [phone]   Fax:  [phone]  Physical Therapy Treatment  Patient Details  Name: [name] MRN: [id number] Date of Birth: Dec 31, 1965 Referring Provider:  Saralyn Pilar, MD  Encounter Date: 11/22/2014      PT End of Session - 11/23/14 1333    Visit Number 6   Number of Visits 9   Date for PT Re-Evaluation 12/29/14   Authorization Type Medicare/Medicaid-G-code required every 10th visit   PT Start Time 1018   PT Stop Time 1103   PT Time Calculation (min) 45 min   Equipment Utilized During Treatment Gait belt   Activity Tolerance Patient tolerated treatment well   Behavior During Therapy [facility] for tasks assessed/performed      Past Medical History  Diagnosis Date  . 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 Depression   . 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 Closed head injury   . Seizures   . Peptic ulcer disease   . Esophageal ulcer   . Malnutrition   . Constipation   . BPH (benign prostatic hyperplasia)   . Smoker     Past Surgical History  Procedure Laterality Date  . Gastrectomy    . Esophagogastroduodenoscopy  06/17/2011    Procedure: ESOPHAGOGASTRODUODENOSCOPY (EGD);  Surgeon: Wonda Horner, MD;  Location: [facility];  Service: Endoscopy;  Laterality: N/A;    There were no vitals filed for this visit.  Visit Diagnosis:  Posture abnormality  Abnormality of gait      Subjective Assessment - 11/22/14 1134    Subjective Denies changes   Patient Stated Goals Pt's goal is to walk again   Currently in Pain? No/denies      Reviewed HEP provided from prior session.  Pt performed hip flexor stretch off edge of mat x 30 second hold x 2.    Quadruped over therapy ball with alternating UE lifts x 10.  Quadruped with alternating UE lifts x 5.  Prone to prone on elbows and PWR! Up in  prone x 10 reps.  Gait with RW x 40' with min progressing to mod assist secondary to pt leaning on walker with forearms and poor upright posture.  Sit<>stand for strengthening and balance control x 10 reps x 2  Standing with min-supervision x 9 minutes while performing UE reaching task      PT Long Term Goals - 11/06/14 1219    PT LONG TERM GOAL #1   Title Pt/caregiver will perform HEP for improved trunk/lower extremity strength, balance, standing tolerance, and gait.  Target 11/29/14   Time 4   Period Weeks   Status On-going   PT LONG TERM GOAL #2   Title Pt will perform sit<>stand transfers with min guard assistance for improved safety with transfers.   Time 4   Period Weeks   Status On-going   PT LONG TERM GOAL #3   Title Pt will perform standing activities at least 5 minutes at counter with min guard assistance and UE support for improved standing tolerance.   Time 4   Period Weeks   Status On-going   PT LONG TERM GOAL #4   Title Pt will ambulate at least 50 ft in 3 minute walk for improved gait efficiency and safety.   Baseline ambulates 33 ft. in 3 minutes, 20 seconds   Time 4   Period Weeks   Status On-going  PT LONG TERM GOAL #5   Title Pt/caregiver will verbalize understanding of community fitness options upon D/C from PT.   Time 4   Period Weeks   Status On-going               Plan - 11/23/14 1334    Clinical Impression Statement Pt participated well with treatment today.  Continues to need assist with gait especially to encourage upright trunk.  Continue PT per POC.   Pt will benefit from skilled therapeutic intervention in order to improve on the following deficits Abnormal gait;Decreased balance;Decreased mobility;Decreased endurance;Decreased safety awareness;Decreased strength;Difficulty walking;Impaired tone;Postural dysfunction   Rehab Potential Fair   Clinical Impairments Affecting Rehab Potential history of head injury   PT Frequency 2x / week    PT Duration 4 weeks   PT Treatment/Interventions ADLs/Self Care Home Management;Gait training;Neuromuscular re-education;Balance training;Therapeutic exercise;Therapeutic activities;Patient/family education   PT Next Visit Plan Begin checking goals.   Consulted and Agree with Plan of Care Patient;Family member/caregiver        Problem List Patient Active Problem List   Diagnosis Date Noted  . 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 Spastic hemiplegia affecting dominant side 02/15/2013  . Iron deficiency anemia due to chronic blood loss 06/09/2011  . 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 GASTROENTERITIS 07/31/2009  . LIPOMA 06/07/2009  . OSTEOPOROSIS 03/15/2008  . FUNGAL DERMATITIS 03/07/2008  . BENIGN PROSTATIC HYPERTROPHY, WITH URINARY OBSTRUCTION 03/07/2008  . 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 Other urinary incontinence 03/07/2008  . GAIT DISTURBANCE 04/02/2007  . DEPRESSION 03/28/2007  . EXOTROPIA 03/28/2007  . PEPTIC ULCER DISEASE, HX OF 03/28/2007  . GERD 02/24/2007  . NASAL FRACTURE 09/21/2006  . VOMITING, PERSISTENT, HX OF 12/24/2005  . LIVER FUNCTION TESTS, ABNORMAL, HX OF 06/19/2005  . 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 HX SEVERE CLOSED HEAD INJURY 05/26/1984  . DYSPHAGIA DUE TO BRAIN INJURY, EPISODIC SILENT ASPIRATION 05/26/1984    Narda Bonds 11/23/2014, 1:38 PM  [facility] [address] [place], [place], [address] Phone: [phone]   Fax:  [place] 11/23/2014 1:38 PM Phone: [phone] Fax: [phone]

## 2014-11-24 ENCOUNTER — Ambulatory Visit: Payer: Medicare Other | Attending: Family Medicine | Admitting: Rehabilitative and Restorative Service Providers"

## 2014-11-24 DIAGNOSIS — X58XXXS Exposure to other specified factors, sequela: Secondary | ICD-10-CM | POA: Insufficient documentation

## 2014-11-24 DIAGNOSIS — R269 Unspecified abnormalities of gait and mobility: Secondary | ICD-10-CM | POA: Insufficient documentation

## 2014-11-24 DIAGNOSIS — R2681 Unsteadiness on feet: Secondary | ICD-10-CM | POA: Insufficient documentation

## 2014-11-24 DIAGNOSIS — R293 Abnormal posture: Secondary | ICD-10-CM | POA: Insufficient documentation

## 2014-11-24 DIAGNOSIS — S0990XS Unspecified injury of head, sequela: Secondary | ICD-10-CM | POA: Insufficient documentation

## 2014-11-28 ENCOUNTER — Ambulatory Visit: Payer: Medicare Other | Admitting: Physical Therapy

## 2014-11-30 ENCOUNTER — Ambulatory Visit: Payer: Medicare Other | Admitting: Physical Therapy

## 2014-11-30 DIAGNOSIS — R293 Abnormal posture: Secondary | ICD-10-CM

## 2014-11-30 NOTE — Therapy (Signed)
[facility] [address] [place], [place], [address] Phone: [phone]   Fax:  [phone]  Patient Details  Name: [name] MRN: [id number] Date of Birth: 11-11-1965 Referring Provider:  Saralyn Pilar, MD  Encounter Date: 11/30/2014  Pt arrived today after 10:50 for 10:15 appointment.  Unable to accommodate with a PT appointment today.  Discussed with pt/caregiver that 1 week (2 visits) remain in [place].  Pt would like to schedule those visits.  PT made it clear to patient that these 2 visits will likely be used to review HEP with caregiver and to check goals, planning for discharge.  Pt verbalizes understanding.    Tressa Maldonado W. 11/30/2014, 2:46 PM Frazier Butt., PT [facility] [address] [place], [place], [address] Phone: [phone]   Fax:  [phone]

## 2014-12-12 ENCOUNTER — Ambulatory Visit: Payer: Medicare Other | Admitting: Physical Therapy

## 2014-12-12 DIAGNOSIS — R269 Unspecified abnormalities of gait and mobility: Secondary | ICD-10-CM | POA: Diagnosis not present

## 2014-12-12 DIAGNOSIS — S0990XS Unspecified injury of head, sequela: Secondary | ICD-10-CM | POA: Diagnosis present

## 2014-12-12 DIAGNOSIS — X58XXXS Exposure to other specified factors, sequela: Secondary | ICD-10-CM | POA: Diagnosis not present

## 2014-12-12 DIAGNOSIS — R2681 Unsteadiness on feet: Secondary | ICD-10-CM | POA: Diagnosis present

## 2014-12-12 DIAGNOSIS — R293 Abnormal posture: Secondary | ICD-10-CM | POA: Diagnosis present

## 2014-12-12 DIAGNOSIS — R2689 Other abnormalities of gait and mobility: Secondary | ICD-10-CM

## 2014-12-12 NOTE — Patient Instructions (Signed)
Gastroc / Heel Cord Stretch - Seated With Towel   Sit in chair with towel or belt around ball of foot. Gently pull foot in toward body, stretching heel cord and calf. Hold for 30 seconds. Make sure that foot is staying straight and maintain the pull for the whole 30 seconds.  Repeat on other leg. Repeat 3 times on each leg.  Do 3 times per day.  Copyright  VHI. All rights reserved.       Sit on edge of a solid chair with arms, feet flat on floor. Lean forward over feet and stand up with hands pushing on chair arms. Sit down slowly with hands on chair arms. Have walker in front of you for safety only, NOT to pull on.  Repeat 10 times per session. Do 3 sessions per day.

## 2014-12-12 NOTE — Therapy (Signed)
[facility] [address] [place], [place], [address] Phone: [phone]   Fax:  [phone]  Physical Therapy Treatment  Patient Details  Name: [name] MRN: [id number] Date of Birth: [birth year] Referring Provider:  Saralyn Pilar, MD  Encounter Date: 12/12/2014      PT End of Session - 12/12/14 1318    Visit Number 7   Number of Visits 9   Date for PT Re-Evaluation 12/29/14   Authorization Type Medicare/Medicaid-G-code required every 10th visit   PT Start Time 1103   PT Stop Time 1145   PT Time Calculation (min) 42 min   Equipment Utilized During Treatment Gait belt   Activity Tolerance Patient tolerated treatment well   Behavior During Therapy [facility] for tasks assessed/performed      Past Medical History  Diagnosis Date  . 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 Depression   . 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 Closed head injury   . Seizures   . Peptic ulcer disease   . Esophageal ulcer   . Malnutrition   . Constipation   . BPH (benign prostatic hyperplasia)   . Smoker     Past Surgical History  Procedure Laterality Date  . Gastrectomy    . Esophagogastroduodenoscopy  06/17/2011    Procedure: ESOPHAGOGASTRODUODENOSCOPY (EGD);  Surgeon: Wonda Horner, MD;  Location: [facility];  Service: Endoscopy;  Laterality: N/A;    There were no vitals filed for this visit.  Visit Diagnosis:  Abnormality of gait  Balance disturbance due to old head injury      Subjective Assessment - 12/12/14 1316    Subjective Denies falls or changes since last visit.  Caregiver reports mobility varies from day to day including amount of assistance needed.   Patient is accompained by: --  caregiver-[name]   Patient Stated Goals Pt's goal is to walk again   Currently in Pain? No/denies     3 minute walk test x 25 ft with RW and min assist plus mod verbal cues.  Tends  to let walker advance too far forward, increased weight bearing thru UE's, increased trunk flexion and decreased coordination of LE's.  Performed standing bil hip flexion at RW x 10 with mod assist.  Heel raises and toe raises x 10 at RW with min assist.  Worked on sit<>stand transfers focusing on proper technique and decreasing posterior push/lean as well a hand placement.  Pt required min assist for sit<>stand plus mod verbal cues with decreased carryover.  Added to HEP.  Bil heelcord stretch seated with sheet x 45 seconds x 2 and provided as HEP.          PT Education - 12/12/14 1317    Education provided Yes   Education Details Addition to HEP   Person(s) Educated Patient;Caregiver(s)   Methods Explanation;Demonstration;Handout   Comprehension Verbalized understanding;Returned demonstration             PT Long Term Goals - 12/12/14 1321    PT LONG TERM GOAL #1   Title Pt/caregiver will perform HEP for improved trunk/lower extremity strength, balance, standing tolerance, and gait.  Target 11/29/14   Time 4   Period Weeks   Status On-going   PT LONG TERM GOAL #2   Title Pt will perform sit<>stand transfers with min guard assistance for improved safety with transfers.   Time 4   Period Weeks   Status Not Met  PT LONG TERM GOAL #3   Title Pt will perform standing activities at least 5 minutes at counter with min guard assistance and UE support for improved standing tolerance.   Time 4   Period Weeks   Status On-going   PT LONG TERM GOAL #4   Title Pt will ambulate at least 50 ft in 3 minute walk for improved gait efficiency and safety.   Baseline ambulates 33 ft. in 3 minutes, 20 seconds;12/12/14 ambulates 25 ft in 3 minutes   Time 4   Period Weeks   Status Not Met   PT LONG TERM GOAL #5   Title Pt/caregiver will verbalize understanding of community fitness options upon D/C from PT.   Time 4   Period Weeks   Status On-going               Plan - 12/12/14  1319    Clinical Impression Statement Pt continues to vary on amount of assistance required for mobility as well as continues to need cues for technique and safety.  LTG 2 & 4 unmet secondary to inconsistent performance.   Pt will benefit from skilled therapeutic intervention in order to improve on the following deficits Abnormal gait;Decreased balance;Decreased mobility;Decreased endurance;Decreased safety awareness;Decreased strength;Difficulty walking;Impaired tone;Postural dysfunction   Rehab Potential Fair   Clinical Impairments Affecting Rehab Potential history of head injury   PT Frequency 2x / week   PT Duration 4 weeks   PT Treatment/Interventions ADLs/Self Care Home Management;Gait training;Neuromuscular re-education;Balance training;Therapeutic exercise;Therapeutic activities;Patient/family education   PT Next Visit Plan Finish checking goals and d/c.   Consulted and Agree with Plan of Care Patient;Family member/caregiver   Family Member Consulted [name]        Problem List Patient Active Problem List   Diagnosis Date Noted  . 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 Spastic hemiplegia affecting dominant side 02/15/2013  . Iron deficiency anemia due to chronic blood loss 06/09/2011  . 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 GASTROENTERITIS 07/31/2009  . LIPOMA 06/07/2009  . OSTEOPOROSIS 03/15/2008  . FUNGAL DERMATITIS 03/07/2008  . BENIGN PROSTATIC HYPERTROPHY, WITH URINARY OBSTRUCTION 03/07/2008  . 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 Other urinary incontinence 03/07/2008  . GAIT DISTURBANCE 04/02/2007  . DEPRESSION 03/28/2007  . EXOTROPIA 03/28/2007  . PEPTIC ULCER DISEASE, HX OF 03/28/2007  . GERD 02/24/2007  . NASAL FRACTURE 09/21/2006  . VOMITING, PERSISTENT, HX OF 12/24/2005  . LIVER FUNCTION TESTS, ABNORMAL, HX OF 06/19/2005   . 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 HX SEVERE CLOSED HEAD INJURY 05/26/1984  . DYSPHAGIA DUE TO BRAIN INJURY, EPISODIC SILENT ASPIRATION 05/26/1984    Narda Bonds 12/12/2014, 1:24 PM  [facility] [address] [place], [place], [address] Phone: [phone]   Fax:  [place], [place] 12/12/2014 1:24 PM Phone: [phone] Fax: [phone]

## 2014-12-13 ENCOUNTER — Ambulatory Visit: Payer: Medicare Other | Admitting: Physical Therapy

## 2014-12-13 DIAGNOSIS — R269 Unspecified abnormalities of gait and mobility: Secondary | ICD-10-CM

## 2014-12-13 NOTE — Patient Instructions (Addendum)
Bridging   Slowly raise buttocks from floor, keeping stomach tight. Repeat 10 times per set. Do 1 sessions per day.  http://orth.exer.us/1096   Copyright  VHI. All rights reserved.   Butterfly, Supine With Partner   Lie on back, feet together. Have partner gently push knees toward floor. Hold 30 seconds. Repeat 3 times per session. Do 1 sessions per day.  Copyright  VHI. All rights reserved.   Hip Abduction / Adduction: with Knee Flexion (Supine)   Place yellow band around both knees.  With both knees bent, gently lower both knees to side and return. Repeat 10 times per set. Do 1 sessions per day.  http://orth.exer.us/682   Copyright  VHI. All rights reserved.   Abdominal Lying   Lie on abdomen, pillows as needed under hips, ankles, forehead and chest. Rest forehead on hands or on folded towel as needed and/or instructed by your therapist. Lie 5 minutes.  Copyright  VHI. All rights reserved.  CAREGIVER ASSISTED: Hamstrings - Supine   Caregiver holds leg at ankle and over knee; raises leg straight. Hold 30 seconds.   Repeat 3 times on each leg.  Do 1 time a day.   Copyright  VHI. All rights reserved.  Double Knee to Chest (Flexion)   Gently pull both knees toward chest. Feel stretch in lower back or buttock area. Breathing deeply, Hold 30 seconds. Repeat 3 times. Do 1 sessions per day.  http://gt2.exer.us/227   Copyright  VHI. All rights reserved.   Hip Flexor Stretch   Lying on back near edge of bed, bend one leg, foot flat. Hang other leg over edge, relaxed, thigh resting entirely on bed for 2 minutes. Do 1 sessions per day. Advanced Exercise: Bend knee back keeping thigh in contact with bed.  http://gt2.exer.us/346   Copyright  VHI. All rights reserved.  Bracing With Arm Raise (Quadruped)   On hands and knees find neutral spine. Tighten pelvic floor and abdominals and hold. Alternately lift arm to shoulder level. Repeat 10 times. Do 1 times a  day.   Copyright  VHI. All rights reserved.      Gastroc / Heel Cord Stretch - Seated With Towel   Sit in chair with towel or belt around ball of foot. Gently pull foot in toward body, stretching heel cord and calf. Hold for 30 seconds. Make sure that foot is staying straight and maintain the pull for the whole 30 seconds. Repeat on other leg. Repeat 3 times on each leg. Do 3 times per day.  Copyright  VHI. All rights reserved.       Sit on edge of a solid chair with arms, feet flat on floor. Lean forward over feet and stand up with hands pushing on chair arms. Sit down slowly with hands on chair arms. Have walker in front of you for safety only, NOT to pull on. Repeat 10 times per session. Do 3 sessions per day.   High Stepping in Place (Sitting)   Sitting, alternately lift knees as high as possible. Keep torso erect. Repeat 10 times, each leg.  Do 1 time a day.  Copyright  VHI. All rights reserved.

## 2014-12-14 NOTE — Therapy (Signed)
[facility] [address] [place], [place], [address] Phone: [phone]   Fax:  [phone]  Physical Therapy Treatment  Patient Details  Name: [name] MRN: [id number] Date of Birth: 1966/04/29 Referring Provider:  Saralyn Pilar, MD  Encounter Date: 12/13/2014      PT End of Session - 12/14/14 1454    Visit Number 8   Number of Visits 9   Date for PT Re-Evaluation 12/29/14   Authorization Type Medicare/Medicaid-G-code required every 10th visit   PT Start Time 1017   PT Stop Time 1105   PT Time Calculation (min) 48 min   Equipment Utilized During Treatment Gait belt   Activity Tolerance Patient tolerated treatment well   Behavior During Therapy [facility] for tasks assessed/performed      Past Medical History  Diagnosis Date  . 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 Depression   . 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 Closed head injury   . Seizures   . Peptic ulcer disease   . Esophageal ulcer   . Malnutrition   . Constipation   . BPH (benign prostatic hyperplasia)   . Smoker     Past Surgical History  Procedure Laterality Date  . Gastrectomy    . Esophagogastroduodenoscopy  06/17/2011    Procedure: ESOPHAGOGASTRODUODENOSCOPY (EGD);  Surgeon: Wonda Horner, MD;  Location: [facility];  Service: Endoscopy;  Laterality: N/A;    There were no vitals filed for this visit.  Visit Diagnosis:  Abnormality of gait      Subjective Assessment - 12/14/14 1448    Subjective Denies falls or changes.   Patient is accompained by: --  caregiver-[name]   Patient Stated Goals Pt's goal is to walk again   Currently in Pain? No/denies     Treatment consisted of performing HEP as provided today and educating caregiver as pt performed exercises.  See HEP.          PT Education - 12/14/14 1450    Education provided Yes   Education Details HEP including  frequency, importance of stretching, continuing gait with caregiver,[name].   Person(s) Educated Patient;Caregiver(s)   Methods Explanation;Demonstration;Handout;Verbal cues   Comprehension Verbalized understanding;Returned demonstration             PT Long Term Goals - 12/14/14 1452    PT LONG TERM GOAL #1   Title Pt/caregiver will perform HEP for improved trunk/lower extremity strength, balance, standing tolerance, and gait.  Target 11/29/14   Time 4   Period Weeks   Status Achieved   PT LONG TERM GOAL #2   Title Pt will perform sit<>stand transfers with min guard assistance for improved safety with transfers.   Time 4   Period Weeks   Status Not Met  Pt not consistent with performance and generally needs min assist   PT LONG TERM GOAL #3   Title Pt will perform standing activities at least 5 minutes at counter with min guard assistance and UE support for improved standing tolerance.   Time 4   Period Weeks   Status Achieved   PT LONG TERM GOAL #4   Title Pt will ambulate at least 50 ft in 3 minute walk for improved gait efficiency and safety.   Baseline ambulates 33 ft. in 3 minutes, 20 seconds;12/12/14 ambulates 25 ft in 3 minutes   Time 4   Period Weeks   Status Not Met   PT LONG TERM  GOAL #5   Title Pt/caregiver will verbalize understanding of community fitness options upon D/C from PT.   Time 4   Period Weeks   Status Achieved               Plan - Jan 10, 2015 1503    Clinical Impression Statement Pt met goals 1,3 and 5.  goals 2 & 4 unmet secondary to pt inconsistent with performance.  Caregiver, [name], to continue ambulation with pt at day center and home caregivers to perform HEP.  Discharge from PT per Amy Marriott,PT.   PT Next Visit Plan discharge per Mady Haagensen, PT.   Consulted and Agree with Plan of Care Patient;Family member/caregiver   Family Member Consulted [name]          G-Codes - 01-10-2015 1505    Functional Assessment Tool Used ambulates 25 ft in  3 minutes, HEP established      Problem List Patient Active Problem List   Diagnosis Date Noted  . 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 Spastic hemiplegia affecting dominant side 02/15/2013  . Iron deficiency anemia due to chronic blood loss 06/09/2011  . 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 GASTROENTERITIS 07/31/2009  . LIPOMA 06/07/2009  . OSTEOPOROSIS 03/15/2008  . FUNGAL DERMATITIS 03/07/2008  . BENIGN PROSTATIC HYPERTROPHY, WITH URINARY OBSTRUCTION 03/07/2008  . 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 Other urinary incontinence 03/07/2008  . GAIT DISTURBANCE 04/02/2007  . DEPRESSION 03/28/2007  . EXOTROPIA 03/28/2007  . PEPTIC ULCER DISEASE, HX OF 03/28/2007  . GERD 02/24/2007  . NASAL FRACTURE 09/21/2006  . VOMITING, PERSISTENT, HX OF 12/24/2005  . LIVER FUNCTION TESTS, ABNORMAL, HX OF 06/19/2005  . 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 HX SEVERE CLOSED HEAD INJURY 05/26/1984  . DYSPHAGIA DUE TO BRAIN INJURY, EPISODIC SILENT ASPIRATION 05/26/1984    Narda Bonds 01/10/15, 3:06 PM  [facility] [address] [place], [place], [address] Phone: [phone]   Fax:  [place], [place] January 10, 2015 3:06 PM Phone: [phone] Fax: [phone]

## 2015-02-02 ENCOUNTER — Other Ambulatory Visit: Payer: Self-pay

## 2015-02-02 MED ORDER — CLONAZEPAM 0.5 MG PO TABS: 0.5000 mg | ORAL_TABLET | Freq: Two times a day (BID) | ORAL | Status: DC | PRN

## 2015-02-19 ENCOUNTER — Ambulatory Visit: Payer: Medicare Other | Admitting: Neurology

## 2015-02-20 ENCOUNTER — Ambulatory Visit (INDEPENDENT_AMBULATORY_CARE_PROVIDER_SITE_OTHER): Payer: Medicare Other | Admitting: Neurology

## 2015-02-20 ENCOUNTER — Encounter: Payer: Self-pay | Admitting: Neurology

## 2015-02-20 VITALS — BP 88/59 | HR 80 | Ht 73.0 in

## 2015-02-20 DIAGNOSIS — G939 Disorder of brain, unspecified: Secondary | ICD-10-CM | POA: Diagnosis not present

## 2015-02-20 DIAGNOSIS — G8110 Spastic hemiplegia affecting unspecified side: Secondary | ICD-10-CM

## 2015-02-20 DIAGNOSIS — G40309 Generalized idiopathic epilepsy and epileptic syndromes, not intractable, without status epilepticus: Secondary | ICD-10-CM

## 2015-02-20 DIAGNOSIS — Z87820 Personal history of traumatic brain injury: Secondary | ICD-10-CM | POA: Insufficient documentation

## 2015-02-20 DIAGNOSIS — Z5181 Encounter for therapeutic drug level monitoring: Secondary | ICD-10-CM | POA: Diagnosis not present

## 2015-02-20 NOTE — Progress Notes (Signed)
Reason for visit: Seizures  [name] is an 49 y.o. male  History of present illness:  Mr. Knope is a 49 year old right-handed white male with a history of a closed head injury and a subsequent spastic quadriparesis, gait disorder, and a history of seizures. He is on Lamictal and carbamazepine, he has not had any recurring seizures over the last one year. He has not had any falls. He does have a significant gait disorder, he requires a PT developed, and a walker for ambulation. He requires assistance with bathing and dressing, he can feed himself. There is some question of a change in his ambulatory ability recently, but his caretaker has been out of work for 6 weeks, it is not clear how often the other caretakers were walking him, he may not have been ambulating daily over the last 6 weeks. The patient returns to the office today for an evaluation. No other new medical issues have come up since last seen.  Past Medical History  Diagnosis Date  . 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 Depression   . 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 Closed head injury   . Seizures   . Peptic ulcer disease   . Esophageal ulcer   . Malnutrition   . Constipation   . BPH (benign prostatic hyperplasia)   . Smoker     Past Surgical History  Procedure Laterality Date  . Gastrectomy    . Esophagogastroduodenoscopy  06/17/2011    Procedure: ESOPHAGOGASTRODUODENOSCOPY (EGD);  Surgeon: Wonda Horner, MD;  Location: [facility];  Service: Endoscopy;  Laterality: N/A;    History reviewed. No pertinent family history.  Social history:  reports that he quit smoking about 4 years ago. He quit smokeless tobacco use about 13 years ago. He reports that he does not drink alcohol or use illicit drugs.    Allergies  Allergen Reactions  . Pineapple Anaphylaxis    Medications:  Prior to Admission medications   Medication Sig Start Date End Date Taking?  Authorizing Provider  carbamazepine (CARBATROL) 300 MG 12 hr capsule Take 300 mg by mouth 2 (two) times daily.   Yes Historical Provider, MD  clonazePAM (KLONOPIN) 0.5 MG tablet Take 1 tablet (0.5 mg total) by mouth 2 (two) times daily as needed. 02/02/15  Yes Kathrynn Ducking, MD  escitalopram (LEXAPRO) 10 MG tablet Take 10 mg by mouth daily.   Yes Historical Provider, MD  LamoTRIgine 100 MG TB24 Take 1 tablet by mouth every morning.   Yes Historical Provider, MD  LamoTRIgine XR 200 MG TB24 Take 1 tablet by mouth at bedtime.   Yes Historical Provider, MD  pantoprazole (PROTONIX) 40 MG tablet Take 40 mg by mouth daily.   Yes Historical Provider, MD  QUEtiapine (SEROQUEL) 100 MG tablet Take 150 mg by mouth at bedtime. 02/02/15  Yes Historical Provider, MD  risperiDONE (RISPERDAL) 2 MG tablet Take 2 mg by mouth at bedtime.   Yes Historical Provider, MD  Tamsulosin HCl (FLOMAX) 0.4 MG CAPS Take 0.4 mg by mouth at bedtime.    Yes Historical Provider, MD  traZODone (DESYREL) 50 MG tablet Take 50 mg by mouth at bedtime.   Yes Historical Provider, MD  zolpidem (AMBIEN) 10 MG tablet Take 10 mg by mouth at bedtime.   Yes Historical Provider, MD    ROS:  Out of a complete 14 system review of symptoms, the patient complains only of the following symptoms, and  all other reviewed systems are negative.  Gait disorder History of seizures  Blood pressure 88/59, pulse 80, height 6\' 1"  (1.854 m).  Physical Exam  General: The patient is alert and cooperative at the time of the examination.  Skin: No significant peripheral edema is noted.   Neurologic Exam  Mental status: The patient is alert and cooperative, not oriented to date.   Cranial nerves: Facial symmetry is present. Speech is dysarthric. Gaze is divergent, exotropia of the left eye is noted. Visual fields are full.  Motor: The patient has 4/5 strength of all 4 extremities.  Sensory examination: Soft touch sensation is symmetric on the face,  arms, and legs.  Coordination: The patient has some dysmetria with finger-nose-finger on the left arm, more so than on the right. The patient has difficulty with heel-to-shin bilaterally.  Gait and station: The patient could be assisted to a standing position, he cannot effectively walk, he tends to fall backwards or to the side.  Reflexes: Deep tendon reflexes are symmetric.   Assessment/Plan:  1. History of closed head injury, spastic quadriparesis  2. History of seizures, well controlled  3. Gait disorder  The patient has had some questionable new changes in his ability to ambulate. It is possible that this may be related to the fact that he has not been ambulated regularly over the last 6 weeks, we will need to follow this over time. The caretaker will contact our office if his ambulatory ability declines. Blood work will be done today. He will follow-up in one year, sooner if needed. The patient is on risperidone, which could cause secondary Parkinsonism.  Jill Alexanders MD 02/20/2015 8:14 PM  [facility] [address] [place], [place] [address]  Phone [phone] Fax [phone]

## 2015-02-20 NOTE — Patient Instructions (Signed)

## 2015-02-21 ENCOUNTER — Telehealth: Payer: Self-pay | Admitting: Neurology

## 2015-02-21 LAB — COMPREHENSIVE METABOLIC PANEL
ALT: 39 IU/L (ref 0–44)
AST: 27 IU/L (ref 0–40)
Albumin/Globulin Ratio: 1.4 (ref 1.1–2.5)
Albumin: 3.5 g/dL (ref 3.5–5.5)
Alkaline Phosphatase: 117 IU/L (ref 39–117)
BUN/Creatinine Ratio: 12 (ref 9–20)
BUN: 10 mg/dL (ref 6–24)
Bilirubin Total: <0.2 mg/dL (ref 0.0–1.2)
CALCIUM: 8.6 mg/dL — AB (ref 8.7–10.2)
CO2: 28 mmol/L (ref 18–29)
Chloride: 103 mmol/L (ref 97–108)
Creatinine, Ser: 0.81 mg/dL (ref 0.76–1.27)
GFR calc Af Amer: 122 mL/min/{1.73_m2} (ref >59)
GFR, EST NON AFRICAN AMERICAN: 106 mL/min/{1.73_m2} (ref >59)
GLUCOSE: 86 mg/dL (ref 65–99)
Globulin, Total: 2.5 g/dL (ref 1.5–4.5)
Potassium: 4.1 mmol/L (ref 3.5–5.2)
Sodium: 143 mmol/L (ref 134–144)
TOTAL PROTEIN: 6 g/dL (ref 6.0–8.5)

## 2015-02-21 LAB — CBC WITH DIFFERENTIAL/PLATELET
Basophils Absolute: 0.1 10*3/uL (ref 0.0–0.2)
Basos: 1 %
EOS (ABSOLUTE): 0.1 10*3/uL (ref 0.0–0.4)
EOS: 2 %
HEMATOCRIT: 20.9 % — AB (ref 37.5–51.0)
Hemoglobin: 6 g/dL — CL (ref 12.6–17.7)
IMMATURE GRANS (ABS): 0 10*3/uL (ref 0.0–0.1)
IMMATURE GRANULOCYTES: 0 %
LYMPHS: 37 %
Lymphocytes Absolute: 1.1 10*3/uL (ref 0.7–3.1)
MCH: 22.1 pg — ABNORMAL LOW (ref 26.6–33.0)
MCHC: 28.7 g/dL — ABNORMAL LOW (ref 31.5–35.7)
MCV: 77 fL — ABNORMAL LOW (ref 79–97)
Monocytes Absolute: 0.5 10*3/uL (ref 0.1–0.9)
Monocytes: 16 %
NEUTROS PCT: 44 %
Neutrophils Absolute: 1.3 10*3/uL — ABNORMAL LOW (ref 1.4–7.0)
PLATELETS: 369 10*3/uL (ref 150–379)
RBC: 2.72 x10E6/uL — CL (ref 4.14–5.80)
RDW: 16.7 % — ABNORMAL HIGH (ref 12.3–15.4)
WBC: 3 10*3/uL — ABNORMAL LOW (ref 3.4–10.8)

## 2015-02-21 LAB — LAMOTRIGINE LEVEL: Lamotrigine Lvl: 6.1 ug/mL (ref 2.0–20.0)

## 2015-02-21 LAB — CARBAMAZEPINE LEVEL, TOTAL: Carbamazepine Lvl: 8.9 ug/mL (ref 4.0–12.0)

## 2015-02-21 NOTE — Telephone Encounter (Signed)
I called the caretaker. The hemoglobin is quite low, around 6.0. I recommended taking him to the emergency room, he will require a blood transfusion, he will also need a workup for the iron deficiency anemia, may be a GI source of blood loss. I faxed the blood work results to the primary care doctor.

## 2015-02-21 NOTE — Telephone Encounter (Signed)
The caretaker was called regarding the blood work results, recommended that the patient go to the emergency room for evaluation, blood transfusion.

## 2015-02-22 ENCOUNTER — Encounter (HOSPITAL_COMMUNITY): Payer: Self-pay | Admitting: *Deleted

## 2015-02-22 ENCOUNTER — Observation Stay (HOSPITAL_COMMUNITY)
Admission: EM | Admit: 2015-02-22 | Discharge: 2015-02-23 | Disposition: A | Discharge status: Home / Self Care | Payer: Medicare Other | Attending: Internal Medicine | Admitting: Internal Medicine

## 2015-02-22 DIAGNOSIS — F419 Anxiety disorder, unspecified: Secondary | ICD-10-CM | POA: Diagnosis not present

## 2015-02-22 DIAGNOSIS — G4700 Insomnia, unspecified: Secondary | ICD-10-CM | POA: Diagnosis not present

## 2015-02-22 DIAGNOSIS — F32A Depression, unspecified: Secondary | ICD-10-CM | POA: Diagnosis present

## 2015-02-22 DIAGNOSIS — F329 Major depressive disorder, single episode, unspecified: Secondary | ICD-10-CM | POA: Diagnosis not present

## 2015-02-22 DIAGNOSIS — G8250 Quadriplegia, unspecified: Secondary | ICD-10-CM | POA: Diagnosis not present

## 2015-02-22 DIAGNOSIS — F411 Generalized anxiety disorder: Secondary | ICD-10-CM | POA: Diagnosis present

## 2015-02-22 DIAGNOSIS — G40409 Other generalized epilepsy and epileptic syndromes, not intractable, without status epilepticus: Secondary | ICD-10-CM | POA: Insufficient documentation

## 2015-02-22 DIAGNOSIS — N400 Enlarged prostate without lower urinary tract symptoms: Secondary | ICD-10-CM | POA: Diagnosis not present

## 2015-02-22 DIAGNOSIS — Z87891 Personal history of nicotine dependence: Secondary | ICD-10-CM | POA: Insufficient documentation

## 2015-02-22 DIAGNOSIS — F70 Mild intellectual disabilities: Secondary | ICD-10-CM | POA: Diagnosis not present

## 2015-02-22 DIAGNOSIS — S0990XA Unspecified injury of head, initial encounter: Secondary | ICD-10-CM | POA: Diagnosis not present

## 2015-02-22 DIAGNOSIS — G40309 Generalized idiopathic epilepsy and epileptic syndromes, not intractable, without status epilepticus: Secondary | ICD-10-CM | POA: Diagnosis present

## 2015-02-22 DIAGNOSIS — G8389 Other specified paralytic syndromes: Secondary | ICD-10-CM

## 2015-02-22 DIAGNOSIS — Z87828 Personal history of other (healed) physical injury and trauma: Secondary | ICD-10-CM | POA: Insufficient documentation

## 2015-02-22 DIAGNOSIS — D649 Anemia, unspecified: Secondary | ICD-10-CM | POA: Diagnosis not present

## 2015-02-22 DIAGNOSIS — Z91018 Allergy to other foods: Secondary | ICD-10-CM | POA: Insufficient documentation

## 2015-02-22 HISTORY — DX: Bipolar disorder, unspecified: F31.9

## 2015-02-22 HISTORY — DX: Anxiety disorder, unspecified: F41.9

## 2015-02-22 LAB — COMPREHENSIVE METABOLIC PANEL
ALT: 40 U/L (ref 17–63)
ANION GAP: 5 (ref 5–15)
AST: 44 U/L — ABNORMAL HIGH (ref 15–41)
Albumin: 3.3 g/dL — ABNORMAL LOW (ref 3.5–5.0)
Alkaline Phosphatase: 103 U/L (ref 38–126)
BILIRUBIN TOTAL: 0.2 mg/dL — AB (ref 0.3–1.2)
BUN: 11 mg/dL (ref 6–20)
CHLORIDE: 100 mmol/L — AB (ref 101–111)
CO2: 29 mmol/L (ref 22–32)
Calcium: 8.4 mg/dL — ABNORMAL LOW (ref 8.9–10.3)
Creatinine, Ser: 0.85 mg/dL (ref 0.61–1.24)
Glucose, Bld: 97 mg/dL (ref 65–99)
POTASSIUM: 4.1 mmol/L (ref 3.5–5.1)
Sodium: 134 mmol/L — ABNORMAL LOW (ref 135–145)
TOTAL PROTEIN: 6.7 g/dL (ref 6.5–8.1)

## 2015-02-22 LAB — CBC
HEMATOCRIT: 21.8 % — AB (ref 39.0–52.0)
HEMOGLOBIN: 6.3 g/dL — AB (ref 13.0–17.0)
MCH: 22 pg — ABNORMAL LOW (ref 26.0–34.0)
MCHC: 28.9 g/dL — ABNORMAL LOW (ref 30.0–36.0)
MCV: 76.2 fL — AB (ref 78.0–100.0)
Platelets: 325 10*3/uL (ref 150–400)
RBC: 2.86 MIL/uL — AB (ref 4.22–5.81)
RDW: 16.8 % — ABNORMAL HIGH (ref 11.5–15.5)
WBC: 5.7 10*3/uL (ref 4.0–10.5)

## 2015-02-22 LAB — POC OCCULT BLOOD, ED: Fecal Occult Bld: NEGATIVE

## 2015-02-22 LAB — PREPARE RBC (CROSSMATCH)

## 2015-02-22 MED ORDER — PANTOPRAZOLE SODIUM 40 MG PO TBEC
40.0000 mg | DELAYED_RELEASE_TABLET | Freq: Every day | ORAL | Status: DC
  Administered 2015-02-23: 40 mg via ORAL
  Filled 2015-02-22: qty 1

## 2015-02-22 MED ORDER — ESCITALOPRAM OXALATE 10 MG PO TABS
10.0000 mg | ORAL_TABLET | Freq: Every day | ORAL | Status: DC
  Administered 2015-02-23: 10 mg via ORAL
  Filled 2015-02-22: qty 1

## 2015-02-22 MED ORDER — TRAZODONE HCL 50 MG PO TABS
50.0000 mg | ORAL_TABLET | Freq: Every day | ORAL | Status: DC
  Administered 2015-02-22: 50 mg via ORAL
  Filled 2015-02-22 (×2): qty 1

## 2015-02-22 MED ORDER — QUETIAPINE FUMARATE 100 MG PO TABS
150.0000 mg | ORAL_TABLET | Freq: Every day | ORAL | Status: DC
  Administered 2015-02-22: 150 mg via ORAL
  Filled 2015-02-22 (×2): qty 1

## 2015-02-22 MED ORDER — RISPERIDONE 2 MG PO TABS
2.0000 mg | ORAL_TABLET | Freq: Two times a day (BID) | ORAL | Status: DC
  Administered 2015-02-22 – 2015-02-23 (×2): 2 mg via ORAL
  Filled 2015-02-22 (×3): qty 1

## 2015-02-22 MED ORDER — SODIUM CHLORIDE 0.9 % IV SOLN: Freq: Once | INTRAVENOUS | Status: DC

## 2015-02-22 MED ORDER — SODIUM CHLORIDE 0.9 % IV SOLN
INTRAVENOUS | Status: DC
  Administered 2015-02-23: 01:00:00 via INTRAVENOUS

## 2015-02-22 MED ORDER — SODIUM CHLORIDE 0.9 % IV SOLN
10.0000 mL/h | Freq: Once | INTRAVENOUS | Status: AC
  Administered 2015-02-22: 10 mL/h via INTRAVENOUS

## 2015-02-22 MED ORDER — ONDANSETRON HCL 4 MG/2ML IJ SOLN: 4.0000 mg | Freq: Four times a day (QID) | INTRAMUSCULAR | Status: DC | PRN

## 2015-02-22 MED ORDER — CARBAMAZEPINE ER 100 MG PO TB12
300.0000 mg | ORAL_TABLET | Freq: Two times a day (BID) | ORAL | Status: DC
  Administered 2015-02-22 – 2015-02-23 (×2): 300 mg via ORAL
  Filled 2015-02-22 (×3): qty 3

## 2015-02-22 MED ORDER — TAMSULOSIN HCL 0.4 MG PO CAPS
0.4000 mg | ORAL_CAPSULE | Freq: Every day | ORAL | Status: DC
  Administered 2015-02-22: 0.4 mg via ORAL
  Filled 2015-02-22 (×2): qty 1

## 2015-02-22 MED ORDER — ACETAMINOPHEN 325 MG PO TABS: 650.0000 mg | ORAL_TABLET | Freq: Four times a day (QID) | ORAL | Status: DC | PRN

## 2015-02-22 MED ORDER — LAMOTRIGINE ER 200 MG PO TB24
1.0000 | ORAL_TABLET | Freq: Every day | ORAL | Status: DC
  Administered 2015-02-22: 200 mg via ORAL
  Filled 2015-02-22 (×2): qty 1

## 2015-02-22 MED ORDER — LAMOTRIGINE ER 100 MG PO TB24
1.0000 | ORAL_TABLET | Freq: Every morning | ORAL | Status: DC
Start: 2015-02-23 — End: 2015-02-23
  Administered 2015-02-23: 100 mg via ORAL
  Filled 2015-02-22 (×2): qty 1

## 2015-02-22 MED ORDER — CLONAZEPAM 0.5 MG PO TABS
0.5000 mg | ORAL_TABLET | Freq: Every day | ORAL | Status: DC
  Administered 2015-02-22: 0.5 mg via ORAL
  Filled 2015-02-22: qty 1

## 2015-02-22 MED ORDER — ONDANSETRON HCL 4 MG PO TABS: 4.0000 mg | ORAL_TABLET | Freq: Four times a day (QID) | ORAL | Status: DC | PRN

## 2015-02-22 MED ORDER — ACETAMINOPHEN 650 MG RE SUPP: 650.0000 mg | Freq: Four times a day (QID) | RECTAL | Status: DC | PRN

## 2015-02-22 MED ORDER — ZOLPIDEM TARTRATE 10 MG PO TABS
10.0000 mg | ORAL_TABLET | Freq: Every day | ORAL | Status: DC
  Administered 2015-02-22: 10 mg via ORAL
  Filled 2015-02-22: qty 1

## 2015-02-22 NOTE — ED Provider Notes (Signed)
CSN: [id number]     Arrival date & time 02/22/15  1403 History   First MD Initiated Contact with Patient 02/22/15 1457     Chief Complaint  Patient presents with  . Abnormal Lab     (Consider location/radiation/quality/duration/timing/severity/associated sxs/prior Treatment) HPI Comments: 49 y.o. Male with history of MR, depression, seizures on multiple psychiatric/mental health medications presents for abnormal laboratory studies.  The patient and his caregiver report that the blood work was taken routinely by his neurologist and that they were instructed to come to the ER because his hemoglobin was found to be low at 6.  The patient denies shortness of breath, chest pain, palpitations.  He has had less energy and been more fatigued than usual.  No abdominal pain.  No dark/black colored stools.  No blood in his stool no history of bleeding.     Past Medical History  Diagnosis Date  . 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 Depression   . 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 Closed head injury   . Seizures   . Peptic ulcer disease   . Esophageal ulcer   . Malnutrition   . Constipation   . BPH (benign prostatic hyperplasia)   . Smoker    Past Surgical History  Procedure Laterality Date  . Gastrectomy    . Esophagogastroduodenoscopy  06/17/2011    Procedure: ESOPHAGOGASTRODUODENOSCOPY (EGD);  Surgeon: Wonda Horner, MD;  Location: [facility];  Service: Endoscopy;  Laterality: N/A;   History reviewed. No pertinent family history. Social History  Substance Use Topics  . Smoking status: Former Smoker -- 0.50 packs/day    Quit date: 05/26/2010  . Smokeless tobacco: Former Systems developer    Quit date: 06/08/2001  . Alcohol Use: No    Review of Systems  Constitutional: Positive for fatigue. Negative for fever, chills and diaphoresis.  HENT: Negative for congestion, postnasal drip and rhinorrhea.   Eyes: Negative for pain and redness.   Respiratory: Negative for cough, chest tightness and shortness of breath.   Cardiovascular: Negative for chest pain and palpitations.  Gastrointestinal: Negative for nausea, vomiting, diarrhea and anal bleeding.  Genitourinary: Negative for dysuria and hematuria.  Musculoskeletal: Negative for myalgias and back pain.  Skin: Positive for pallor. Negative for rash.  Neurological: Negative for dizziness, weakness and light-headedness.  Hematological: Negative for adenopathy. Does not bruise/bleed easily.      Allergies  Pineapple  Home Medications   Prior to Admission medications   Medication Sig Start Date End Date Taking? Authorizing Provider  carbamazepine (CARBATROL) 300 MG 12 hr capsule Take 300 mg by mouth 2 (two) times daily.   Yes Historical Provider, MD  clonazePAM (KLONOPIN) 0.5 MG tablet Take 1 tablet (0.5 mg total) by mouth 2 (two) times daily as needed. Patient taking differently: Take 0.5 mg by mouth at bedtime.  02/02/15  Yes Kathrynn Ducking, MD  escitalopram (LEXAPRO) 10 MG tablet Take 10 mg by mouth daily.   Yes Historical Provider, MD  LamoTRIgine 100 MG TB24 Take 1 tablet by mouth every morning.   Yes Historical Provider, MD  LamoTRIgine XR 200 MG TB24 Take 1 tablet by mouth at bedtime.   Yes Historical Provider, MD  pantoprazole (PROTONIX) 40 MG tablet Take 40 mg by mouth daily.   Yes Historical Provider, MD  QUEtiapine (SEROQUEL) 100 MG tablet Take 150 mg by mouth at bedtime. 02/02/15  Yes Historical Provider, MD  risperiDONE (RISPERDAL) 2 MG tablet Take 2  mg by mouth 2 (two) times daily.    Yes Historical Provider, MD  Tamsulosin HCl (FLOMAX) 0.4 MG CAPS Take 0.4 mg by mouth at bedtime.    Yes Historical Provider, MD  traZODone (DESYREL) 50 MG tablet Take 50 mg by mouth at bedtime.   Yes Historical Provider, MD  zolpidem (AMBIEN) 10 MG tablet Take 10 mg by mouth at bedtime.   Yes Historical Provider, MD   BP 107/73 mmHg  Pulse 94  Temp(Src) 98.8 F (37.1 C) (Oral)   Resp 11  SpO2 98% Physical Exam  Constitutional: He is oriented to person, place, and time. He appears well-developed and well-nourished. No distress.  HENT:  Head: Normocephalic and atraumatic.  Right Ear: External ear normal.  Left Ear: External ear normal.  Mouth/Throat: Oropharynx is clear and moist. No oropharyngeal exudate.  Eyes: EOM are normal. Pupils are equal, round, and reactive to light.  Pale conjunctiva  Neck: Normal range of motion. Neck supple.  Cardiovascular: Normal rate, regular rhythm, normal heart sounds and intact distal pulses.   No murmur heard. Pulmonary/Chest: Effort normal. No respiratory distress. He has no wheezes. He has no rales.  Abdominal: Soft. He exhibits no distension. There is no tenderness. There is no rebound.  Genitourinary: Guaiac negative stool.  Musculoskeletal: Normal range of motion. He exhibits no edema or tenderness.  Neurological: He is alert and oriented to person, place, and time.  Skin: Skin is warm and dry. He is not diaphoretic.  Vitals reviewed.   ED Course  Procedures (including critical care time) Labs Review Labs Reviewed  COMPREHENSIVE METABOLIC PANEL - Abnormal; Notable for the following:    Sodium 134 (*)    Chloride 100 (*)    Calcium 8.4 (*)    Albumin 3.3 (*)    AST 44 (*)    Total Bilirubin 0.2 (*)    All other components within normal limits  CBC - Abnormal; Notable for the following:    RBC 2.86 (*)    Hemoglobin 6.3 (*)    HCT 21.8 (*)    MCV 76.2 (*)    MCH 22.0 (*)    MCHC 28.9 (*)    RDW 16.8 (*)    All other components within normal limits  POC OCCULT BLOOD, ED  TYPE AND SCREEN  PREPARE RBC (CROSSMATCH)    Imaging Review No results found. I have personally reviewed and evaluated these images and lab results as part of my medical decision-making.   EKG Interpretation None      MDM  Patient seen and evaluated in stable condition.  Repeat Hb 6.3.  Patient hemodynamically stable.  Hemoccult  negative.  Patient mildly symptomatic. On multiple psych meds.  Patient to be transfused 2 units pRBCs.  Discussed with hospitalist who agreed with admission and patient was admitted under the care of Dr. Charlies Silvers.  Patient and caregiver update on resutls and plan of care.  Final diagnoses:  Anemia, unspecified anemia type    1. Anemia, symptomatic    Harvel Quale, MD 02/22/15 [phone]

## 2015-02-22 NOTE — ED Notes (Signed)
Hospitalist at bedside 

## 2015-02-22 NOTE — ED Notes (Signed)
Physician notified of critical hemoglobin.

## 2015-02-22 NOTE — ED Notes (Signed)
Care giver report pt hx of menta retardation, reports pt vomited a few times last week and vomit was dark brown, this week vomited x1 on Monday, vomit was dark brown. Denies blood in stool or urine.

## 2015-02-22 NOTE — ED Notes (Signed)
Report given to [place], [place]

## 2015-02-22 NOTE — H&P (Addendum)
[facility] History and Physical  [name] BZJ:[id number] DOB: 1965/09/02 DOA: 02/22/2015  Referring physician: ER PA: Lonia Skinner PCP: Saralyn Pilar, MD  Chief Complaint: low hemoglobin  HPI:  49 year old male with past medical history of mental retardation, closed head injury and spastic quadriparesis, depression, anxiety who presented to [facility] ED for low hemoglobin which was checked routinely by his neurologist. Pt apparently have felt fatigues but no other reports of weakness. No reports of pain or shortness of breath. No reports of bleeding. No dark stool or blood in stool. No abdominal pain. No chest pain. No lightheadedness or loss of coconsciousness.   In ED, pt was hemodynamically stable. Blood work was notable of hemoglobin of 6.3. PRBC transfusion started in ED. He was admitted for observation to medical floor. FOBT was negative.    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 50-70) - Stable    Closed head injury / Spastic quadriparesis - Stable     Insomnia - Continue Ambien and trazodone    DVT prophylaxis:  - SCD;s bilaterally   Radiological Exams on Admission: No results found.   Code Status: Full Family Communication: Plan of care discussed with the patient's caregiver at the bedside  Disposition Plan: Admit for further evaluation, observation   Leisa Lenz, MD  [facility] Pager [phone]  Time spent in minutes: 50 minutes  Review of Systems:  Constitutional: Negative for fever, chills and malaise/fatigue. Negative for diaphoresis.  HENT: Negative for hearing loss, ear pain, nosebleeds, congestion, sore  throat, neck pain, tinnitus and ear discharge.   Eyes: Negative for blurred vision, double vision, photophobia, pain, discharge and redness.  Respiratory: Negative for cough, hemoptysis, sputum production, shortness of breath, wheezing and stridor.   Cardiovascular: Negative for chest pain, palpitations, orthopnea, claudication and leg swelling.  Gastrointestinal: Negative for nausea, vomiting and abdominal pain. Negative for heartburn, constipation, blood in stool and melena.  Genitourinary: Negative for dysuria, urgency, frequency, hematuria and flank pain.  Musculoskeletal: Negative for myalgias, back pain, joint pain and falls.  Skin: Negative for itching and rash.  Neurological: Negative for dizziness and weakness. Negative for tingling, tremors, sensory change, speech change, focal weakness, loss of consciousness and headaches.  Endo/Heme/Allergies: Negative for environmental allergies and polydipsia. Does not bruise/bleed easily.  Psychiatric/Behavioral: Negative for suicidal ideas. The patient is not nervous/anxious.      Past Medical History  Diagnosis Date  . 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 Depression   . 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 Closed head injury   . Seizures   . Peptic ulcer disease   . Esophageal ulcer   . Malnutrition   . Constipation   . BPH (benign prostatic hyperplasia)   . Smoker    Past Surgical History  Procedure Laterality Date  . Gastrectomy    . Esophagogastroduodenoscopy  06/17/2011    Procedure: ESOPHAGOGASTRODUODENOSCOPY (EGD);  Surgeon: Wonda Horner, MD;  Location: [facility];  Service: Endoscopy;  Laterality: N/A;   Social History:  reports that he quit smoking about 4 years ago. He quit smokeless tobacco use about  13 years ago. He reports that he does not drink alcohol or use illicit drugs.  Allergies  Allergen Reactions  . Pineapple Anaphylaxis    Family History: Hypertension  parents    Prior to Admission medications   Medication Sig Start Date End Date Taking? Authorizing Provider  carbamazepine (CARBATROL) 300 MG 12 hr capsule Take 300 mg by mouth 2 (two) times daily.   Yes Historical Provider, MD  clonazePAM (KLONOPIN) 0.5 MG tablet Take 1 tablet (0.5 mg total) by mouth 2 (two) times daily as needed. Patient taking differently: Take 0.5 mg by mouth at bedtime.  02/02/15  Yes Kathrynn Ducking, MD  escitalopram (LEXAPRO) 10 MG tablet Take 10 mg by mouth daily.   Yes Historical Provider, MD  LamoTRIgine 100 MG TB24 Take 1 tablet by mouth every morning.   Yes Historical Provider, MD  LamoTRIgine XR 200 MG TB24 Take 1 tablet by mouth at bedtime.   Yes Historical Provider, MD  pantoprazole (PROTONIX) 40 MG tablet Take 40 mg by mouth daily.   Yes Historical Provider, MD  QUEtiapine (SEROQUEL) 100 MG tablet Take 150 mg by mouth at bedtime. 02/02/15  Yes Historical Provider, MD  risperiDONE (RISPERDAL) 2 MG tablet Take 2 mg by mouth 2 (two) times daily.    Yes Historical Provider, MD  Tamsulosin HCl (FLOMAX) 0.4 MG CAPS Take 0.4 mg by mouth at bedtime.    Yes Historical Provider, MD  traZODone (DESYREL) 50 MG tablet Take 50 mg by mouth at bedtime.   Yes Historical Provider, MD  zolpidem (AMBIEN) 10 MG tablet Take 10 mg by mouth at bedtime.   Yes Historical Provider, MD   Physical Exam: Filed Vitals:   02/22/15 1626 02/22/15 1630 02/22/15 1730 02/22/15 1751  BP: 110/76 105/69 102/66 92/69  Pulse: 94 95 88 86  Temp:   98.1 F (36.7 C) 97.9 F (36.6 C)  TempSrc:   Oral Oral  Resp: 17 16 16 16   SpO2: 99%  100% 100%    Physical Exam  Constitutional: Appears well-developed and well-nourished. No distress.  HENT: Normocephalic. No tonsillar erythema or exudates Eyes: Conjunctivae  are normal. No scleral icterus.  Neck: Normal ROM. Neck supple. No JVD. No tracheal deviation. No thyromegaly.  CVS: RRR, S1/S2 +, no murmurs, no gallops, no carotid bruit.  Pulmonary:  Effort and breath sounds normal, no stridor, rhonchi, wheezes, rales.  Abdominal: Soft. BS +,  no distension, tenderness, rebound or guarding.  Musculoskeletal: Normal range of motion. No edema and no tenderness.  Lymphadenopathy: No lymphadenopathy noted, cervical, inguinal. Neuro: Alert. No focal deficits, spastic quadriparesis  Skin: Skin is warm and dry. No rash noted.  No erythema. No pallor.  Psychiatric: Normal mood and affect.   Labs on Admission:  Basic Metabolic Panel:  Recent Labs Lab 02/20/15 1144 02/22/15 1456  NA 143 134*  K 4.1 4.1  CL 103 100*  CO2 28 29  GLUCOSE 86 97  BUN 10 11  CREATININE 0.81 0.85  CALCIUM 8.6* 8.4*   Liver Function Tests:  Recent Labs Lab 02/20/15 1144 02/22/15 1456  AST 27 44*  ALT 39 40  ALKPHOS 117 103  BILITOT <0.2 0.2*  PROT 6.0 6.7  ALBUMIN  --  3.3*   No results for input(s): LIPASE, AMYLASE in the last 168 hours. No results for input(s): AMMONIA in the last 168 hours. CBC:  Recent Labs Lab 02/20/15 1144 02/22/15 1456  WBC 3.0* 5.7  NEUTROABS 1.3*  --   HGB  --  6.3*  HCT 20.9* 21.8*  MCV  --  76.2*  PLT  --  325   Cardiac Enzymes: No results for input(s): CKTOTAL, CKMB, CKMBINDEX, TROPONINI in the last 168 hours. BNP: Invalid input(s): POCBNP CBG: No results for input(s): GLUCAP in the last 168 hours.  If 7PM-7AM, please contact night-coverage [email or web address] Password [facility] 02/22/2015, 6:12 PM

## 2015-02-22 NOTE — ED Notes (Signed)
I have called pt's listed emergency contact Marius Ditch) to obtain a verbal consent for blood transfusion

## 2015-02-22 NOTE — ED Notes (Signed)
Per caregiver patient had blood drawn on yesterday and was called today by doctor to say that hgb was 6. Patient here for further evaluation.

## 2015-02-23 DIAGNOSIS — F411 Generalized anxiety disorder: Secondary | ICD-10-CM | POA: Diagnosis not present

## 2015-02-23 DIAGNOSIS — S0990XS Unspecified injury of head, sequela: Secondary | ICD-10-CM | POA: Diagnosis not present

## 2015-02-23 DIAGNOSIS — D649 Anemia, unspecified: Secondary | ICD-10-CM | POA: Diagnosis not present

## 2015-02-23 DIAGNOSIS — F329 Major depressive disorder, single episode, unspecified: Secondary | ICD-10-CM | POA: Diagnosis not present

## 2015-02-23 LAB — BASIC METABOLIC PANEL
Anion gap: 4 — ABNORMAL LOW (ref 5–15)
BUN: 10 mg/dL (ref 6–20)
CALCIUM: 8.6 mg/dL — AB (ref 8.9–10.3)
CO2: 31 mmol/L (ref 22–32)
Chloride: 109 mmol/L (ref 101–111)
Creatinine, Ser: 0.76 mg/dL (ref 0.61–1.24)
GFR calc Af Amer: >60 mL/min (ref >60)
Glucose, Bld: 79 mg/dL (ref 65–99)
POTASSIUM: 4.1 mmol/L (ref 3.5–5.1)
Sodium: 144 mmol/L (ref 135–145)

## 2015-02-23 LAB — CBC
HEMATOCRIT: 27 % — AB (ref 39.0–52.0)
HEMOGLOBIN: 8.3 g/dL — AB (ref 13.0–17.0)
MCH: 24.8 pg — AB (ref 26.0–34.0)
MCHC: 30.7 g/dL (ref 30.0–36.0)
MCV: 80.6 fL (ref 78.0–100.0)
Platelets: 317 10*3/uL (ref 150–400)
RBC: 3.35 MIL/uL — AB (ref 4.22–5.81)
RDW: 16.9 % — ABNORMAL HIGH (ref 11.5–15.5)
WBC: 4.5 10*3/uL (ref 4.0–10.5)

## 2015-02-23 LAB — TYPE AND SCREEN
ABO/RH(D): O POS
Antibody Screen: NEGATIVE
UNIT DIVISION: 0
Unit division: 0

## 2015-02-23 MED ORDER — FERROUS SULFATE 325 (65 FE) MG PO TABS: 325.0000 mg | ORAL_TABLET | Freq: Every day | ORAL | Status: DC

## 2015-02-23 NOTE — Progress Notes (Signed)
Post H&H not ordered specifically since it was to be included in AM CBC. Will continue to monitor.

## 2015-02-23 NOTE — Discharge Summary (Signed)
Physician Discharge Summary  [name] TDD:[id number] DOB: 09-01-1965 DOA: 02/22/2015  PCP: Saralyn Pilar, MD  Admit date: 02/22/2015 Discharge date: 02/23/2015  Recommendations for Outpatient Follow-up:  1.  No changes in medications since the admission. Patient will follow-up with primary care physician to recheck hemoglobin in about one week after discharge.  Discharge Diagnoses:  Principal Problem:   Symptomatic anemia Active Problems:   Depression   Generalized convulsive epilepsy   Mental retardation, mild (I.Q. 50-70)   Closed head injury   Spastic quadriparesis   Anxiety state    Discharge Condition: stable   Diet recommendation: as tolerated   History of present illness:  49 year old male with past medical history of mental retardation, closed head injury and spastic quadriparesis, depression, anxiety who presented to [facility] ED for low hemoglobin which was checked routinely by his neurologist.  hemoglobin was 6.3 on the admission. He was given total of 2 units of PRBC during this hospital stay with appropriate rise in hemoglobin. FOBT was negative.  Hospital Course:   Principal Problem:  Symptomatic anemia - Unclear etiology - FOBT negative on admission - Hemoglobin was 6.3 on the admission. Patient has received 2 units of PRBC during this hospital stay with appropriate rise in hemoglobin to 8.3. - Use ferrous sulfate supplementation once daily on discharge.   Active Problems:  Depression, anxiety - Continue Klonopin,  Seroquel, Lexapro and risperidone on discharge.   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 50-70) - Stable   Closed head injury / Spastic quadriparesis - Stable    Insomnia - Continue Ambien and trazodone  On discharge   DVT prophylaxis:  -  SCDs bilaterally throughout the hospital stay.  SignedLeisa Lenz, MD  [facility] 02/23/2015, 9:42 AM  Pager #: [phone]  Time spent in minutes: less than 30 minutes   Discharge Exam: Filed Vitals:   02/23/15 0446  BP: 98/77  Pulse: 75  Temp: 97.5 F (36.4 C)  Resp: 16   Filed Vitals:   02/22/15 2318 02/23/15 0109 02/23/15 0446 02/23/15 0459  BP: 105/59 106/71 98/77   Pulse: 98 78 75   Temp: 98.4 F (36.9 C) 98.2 F (36.8 C) 97.5 F (36.4 C)   TempSrc: Oral Oral Oral   Resp: 18 16 16    Height:    6\' 1"  (1.854 m)  Weight:    74.8 kg (164 lb 14.5 oz)  SpO2: 97% 99% 98%     General: Pt is alert, follows commands appropriately, not in acute distress Cardiovascular: Regular rate and rhythm, S1/S2 appreciated  Respiratory: Clear to auscultation bilaterally, no wheezing, no crackles, no rhonchi Abdominal: Soft, non tender, non distended, bowel sounds +, no guarding Extremities: no edema, no cyanosis, pulses palpable bilaterally DP and PT Neuro: Grossly nonfocal  Discharge Instructions  Discharge Instructions    Call MD for:  difficulty breathing, headache or visual disturbances    Complete by:  As directed      Call MD for:  persistant nausea and vomiting    Complete by:  As directed      Call MD for:  redness, tenderness, or signs of infection (pain, swelling, redness, odor or green/yellow discharge around incision site)    Complete by:  As directed      Call MD for:  severe uncontrolled pain    Complete by:  As directed  Diet - low sodium heart healthy    Complete by:  As directed      Increase activity slowly    Complete by:  As directed             Medication List    STOP taking these medications        clonazePAM 0.5 MG tablet  Commonly known as:  KLONOPIN      TAKE these medications        carbamazepine 300 MG 12 hr capsule  Commonly known as:  CARBATROL  Take 300 mg by mouth 2 (two) times daily.     escitalopram 10 MG tablet  Commonly known as:  LEXAPRO  Take 10 mg by mouth daily.     ferrous sulfate  325 (65 FE) MG tablet  Take 1 tablet (325 mg total) by mouth daily with breakfast.     LamoTRIgine 100 MG Tb24  Take 1 tablet by mouth every morning.     LamoTRIgine XR 200 MG Tb24  Take 1 tablet by mouth at bedtime.     pantoprazole 40 MG tablet  Commonly known as:  PROTONIX  Take 40 mg by mouth daily.     QUEtiapine 100 MG tablet  Commonly known as:  SEROQUEL  Take 150 mg by mouth at bedtime.     risperiDONE 2 MG tablet  Commonly known as:  RISPERDAL  Take 2 mg by mouth 2 (two) times daily.     tamsulosin 0.4 MG Caps capsule  Commonly known as:  FLOMAX  Take 0.4 mg by mouth at bedtime.     traZODone 50 MG tablet  Commonly known as:  DESYREL  Take 50 mg by mouth at bedtime.     zolpidem 10 MG tablet  Commonly known as:  AMBIEN  Take 10 mg by mouth at bedtime.            Follow-up Information    Follow up with Saralyn Pilar, MD. Schedule an appointment as soon as possible for a visit in 1 week.   Specialty:  Family Medicine   Why:  Follow up appt after recent hospitalization   Contact information:   [place] [address] [phone]        The results of significant diagnostics from this hospitalization (including imaging, microbiology, ancillary and laboratory) are listed below for reference.    Significant Diagnostic Studies: No results found.  Microbiology: No results found for this or any previous visit (from the past 240 hour(s)).   Labs: Basic Metabolic Panel:  Recent Labs Lab 02/20/15 1144 02/22/15 1456 02/23/15 0550  NA 143 134* 144  K 4.1 4.1 4.1  CL 103 100* 109  CO2 28 29 31   GLUCOSE 86 97 79  BUN 10 11 10   CREATININE 0.81 0.85 0.76  CALCIUM 8.6* 8.4* 8.6*   Liver Function Tests:  Recent Labs Lab 02/20/15 1144 02/22/15 1456  AST 27 44*  ALT 39 40  ALKPHOS 117 103  BILITOT <0.2 0.2*  PROT 6.0 6.7  ALBUMIN  --  3.3*   No results for input(s): LIPASE, AMYLASE in the last 168 hours. No results  for input(s): AMMONIA in the last 168 hours. CBC:  Recent Labs Lab 02/20/15 1144 02/22/15 1456 02/23/15 0550  WBC 3.0* 5.7 4.5  NEUTROABS 1.3*  --   --   HGB  --  6.3* 8.3*  HCT 20.9* 21.8* 27.0*  MCV  --  76.2* 80.6  PLT  --  325 317   Cardiac Enzymes: No results for input(s): CKTOTAL, CKMB, CKMBINDEX, TROPONINI in the last 168 hours. BNP: BNP (last 3 results) No results for input(s): BNP in the last 8760 hours.  ProBNP (last 3 results) No results for input(s): PROBNP in the last 8760 hours.  CBG: No results for input(s): GLUCAP in the last 168 hours.

## 2015-02-23 NOTE — Care Management Note (Signed)
Case Management Note  Patient Details  Name: [name] MRN: [id number] Date of Birth: Oct 08, 1965  Subjective/Objective:  49 y/o m admitted w/symptomatic anemia. From home.                  Action/Plan:d/c home no needs or orders.   Expected Discharge Date:                  Expected Discharge Plan:  Home/Self Care  In-House Referral:     Discharge planning Services  CM Consult  Post Acute Care Choice:    Choice offered to:     DME Arranged:    DME Agency:     HH Arranged:    [place] Agency:     Status of Service:  Completed, signed off  Medicare Important Message Given:    Date Medicare IM Given:    Medicare IM give by:    Date Additional Medicare IM Given:    Additional Medicare Important Message give by:     If discussed at [place] of Stay Meetings, dates discussed:    Additional Comments:  Dessa Phi, RN 02/23/2015, 1:19 PM

## 2015-02-23 NOTE — Progress Notes (Signed)
[name] to be D/C'd [facility] to [facility] per MD order.  Discussed with the patient and all questions fully answered.  VSS, Skin clean, dry and intact without evidence of skin break down, no evidence of skin tears noted. IV catheter discontinued intact. Site without signs and symptoms of complications. Dressing and pressure applied.  An After Visit Summary was printed and given to the patient. Patient received prescription.  D/c education completed with patient/family including follow up instructions, medication list, d/c activities limitations if indicated, with other d/c instructions as indicated by MD - patient able to verbalize understanding, all questions fully answered.   Patient instructed to return to ED, call 911, or call MD for any changes in condition.   Patient escorted via [place], and D/C home via private auto.  [name] D 02/23/2015 11:16 AM

## 2015-02-23 NOTE — Discharge Instructions (Signed)
Anemia, Nonspecific Anemia is a condition in which the concentration of red blood cells or hemoglobin in the blood is below normal. Hemoglobin is a substance in red blood cells that carries oxygen to the tissues of the body. Anemia results in not enough oxygen reaching these tissues.  CAUSES  Common causes of anemia include:   Excessive bleeding. Bleeding may be internal or external. This includes excessive bleeding from periods (in women) or from the intestine.   Poor nutrition.   Chronic kidney, thyroid, and liver disease.  Bone marrow disorders that decrease red blood cell production.  Cancer and treatments for cancer.  HIV, AIDS, and their treatments.  Spleen problems that increase red blood cell destruction.  Blood disorders.  Excess destruction of red blood cells due to infection, medicines, and autoimmune disorders. SIGNS AND SYMPTOMS   Minor weakness.   Dizziness.   Headache.  Palpitations.   Shortness of breath, especially with exercise.   Paleness.  Cold sensitivity.  Indigestion.  Nausea.  Difficulty sleeping.  Difficulty concentrating. Symptoms may occur suddenly or they may develop slowly.  DIAGNOSIS  Additional blood tests are often needed. These help your health care provider determine the best treatment. Your health care provider will check your stool for blood and look for other causes of blood loss.  TREATMENT  Treatment varies depending on the cause of the anemia. Treatment can include:   Supplements of iron, vitamin B12, or folic acid.   Hormone medicines.   A blood transfusion. This may be needed if blood loss is severe.   Hospitalization. This may be needed if there is significant continual blood loss.   Dietary changes.  Spleen removal. HOME CARE INSTRUCTIONS Keep all follow-up appointments. It often takes many weeks to correct anemia, and having your health care provider check on your condition and your response to  treatment is very important. SEEK IMMEDIATE MEDICAL CARE IF:   You develop extreme weakness, shortness of breath, or chest pain.   You become dizzy or have trouble concentrating.  You develop heavy vaginal bleeding.   You develop a rash.   You have bloody or black, tarry stools.   You faint.   You vomit up blood.   You vomit repeatedly.   You have abdominal pain.  You have a fever or persistent symptoms for more than 2-3 days.   You have a fever and your symptoms suddenly get worse.   You are dehydrated.  MAKE SURE YOU:  Understand these instructions.  Will watch your condition.  Will get help right away if you are not doing well or get worse. Document Released: 06/19/2004 Document Revised: 01/12/2013 Document Reviewed: 11/05/2012 ExitCare Patient Information 2015 [employer]. This information is not intended to replace advice given to you by your health care provider. Make sure you discuss any questions you have with your health care provider.  

## 2015-02-26 ENCOUNTER — Other Ambulatory Visit: Payer: Self-pay | Admitting: Nurse Practitioner

## 2015-04-10 ENCOUNTER — Telehealth: Payer: Self-pay | Admitting: Neurology

## 2015-04-10 MED ORDER — CARBAMAZEPINE ER 200 MG PO TB12
ORAL_TABLET | ORAL | Status: DC
Start: 2015-04-10 — End: 2015-06-22

## 2015-04-10 NOTE — Telephone Encounter (Signed)
I called back and spoke with the pharmacist.  She said Carbatrol 300mg  is on long term back order.  Stated Carbatrol 200mg  and Carbamazepine ER 200mg  are both available.  They would like to know if the Rx can be changed.  Please advise.  Thank you.

## 2015-04-10 NOTE — Telephone Encounter (Signed)
I called the patient back to relay drug change.  Cell line is not accepting incoming calls.  Got no answer. On home line.  Temporary line is for a facility.  I called the pharmacy back and spoke with Latoya.  She verified they do have the new order.  Says the patient is currently in a home care facility, and the point of contact for this patient is [facility] at [phone].  I called and spoke with [name] at the home care facility.  (on contact list) Explained drug change.  She expressed understanding.  Asked that we fax this info to her at [phone].  A copy of new order has been faxed.  Says they will let him finish the meds he has on hand there, then will transition to new order.  They will callus back if anything further is needed.

## 2015-04-10 NOTE — Telephone Encounter (Signed)
[facility] has called and states that carbamazepine (CARBATROL) 300 MG 12 hr capsule is on back order. They would like new instructions on how to proceed. [phone]

## 2015-04-11 NOTE — Telephone Encounter (Signed)
[facility] called and states medication carbamazepine (TEGRETOL-XR) 200 MG 12 hr tablet is on back order and they need to have permission to change manufacturers. Please call and advise [phone]

## 2015-04-11 NOTE — Telephone Encounter (Signed)
I called the pharmacy back.  Spoke with Larene Beach, pharmacist.  Says they just found out they actually can get the Carbatrol 300mg  capsules that patient has been taking, however, the manufacturer has changed.  They will proceed with original order, and cancel 200mg  Rx.  I called [name] back at the facility to advise.  Got no answer.  Left message.

## 2015-06-14 ENCOUNTER — Encounter: Payer: Self-pay | Admitting: Physical Therapy

## 2015-06-14 NOTE — Therapy (Signed)
[facility] [address] [place], [place], [address] Phone: [phone]   Fax:  [phone]  Patient Details  Name: [name] MRN: [id number] Date of Birth: [birth year] Referring Provider:  No ref. provider found  Encounter Date: 06/14/2015  PHYSICAL THERAPY DISCHARGE SUMMARY  Visits from Start of Care: 8  Current functional level related to goals / functional outcomes:     PT Long Term Goals - 12/14/14 1452    PT LONG TERM GOAL #1   Title Pt/caregiver will perform HEP for improved trunk/lower extremity strength, balance, standing tolerance, and gait.  Target 11/29/14   Time 4   Period Weeks   Status Achieved   PT LONG TERM GOAL #2   Title Pt will perform sit<>stand transfers with min guard assistance for improved safety with transfers.   Time 4   Period Weeks   Status Not Met  Pt not consistent with performance and generally needs min assist   PT LONG TERM GOAL #3   Title Pt will perform standing activities at least 5 minutes at counter with min guard assistance and UE support for improved standing tolerance.   Time 4   Period Weeks   Status Achieved   PT LONG TERM GOAL #4   Title Pt will ambulate at least 50 ft in 3 minute walk for improved gait efficiency and safety.   Baseline ambulates 33 ft. in 3 minutes, 20 seconds;12/12/14 ambulates 25 ft in 3 minutes   Time 4   Period Weeks   Status Not Met   PT LONG TERM GOAL #5   Title Pt/caregiver will verbalize understanding of community fitness options upon D/C from PT.   Time 4   Period Weeks   Status Achieved    Pt has met 3 of 5 long term goals.   Remaining deficits: Strength, gait safety, balance   Education / Equipment: HEP, safety with transfers, community fitness  Plan: Patient agrees to discharge.  Patient goals were partially met. Patient is being discharged due to being pleased with the current functional level.  ?????      Dona Klemann  W. 06/14/2015, 1:22 PM [facility] [address] [place], [place], [address] Phone: [phone]   Fax:  [phone]

## 2015-06-18 ENCOUNTER — Emergency Department (HOSPITAL_COMMUNITY)
Admission: EM | Admit: 2015-06-18 | Discharge: 2015-06-18 | Disposition: A | Discharge status: Home / Self Care | Payer: Medicare Other | Attending: Emergency Medicine | Admitting: Emergency Medicine

## 2015-06-18 ENCOUNTER — Encounter (HOSPITAL_COMMUNITY): Payer: Self-pay | Admitting: Emergency Medicine

## 2015-06-18 DIAGNOSIS — N400 Enlarged prostate without lower urinary tract symptoms: Secondary | ICD-10-CM | POA: Insufficient documentation

## 2015-06-18 DIAGNOSIS — Z8639 Personal history of other endocrine, nutritional and metabolic disease: Secondary | ICD-10-CM | POA: Diagnosis not present

## 2015-06-18 DIAGNOSIS — R569 Unspecified convulsions: Secondary | ICD-10-CM | POA: Diagnosis not present

## 2015-06-18 DIAGNOSIS — F319 Bipolar disorder, unspecified: Secondary | ICD-10-CM | POA: Insufficient documentation

## 2015-06-18 DIAGNOSIS — Z79899 Other long term (current) drug therapy: Secondary | ICD-10-CM | POA: Insufficient documentation

## 2015-06-18 DIAGNOSIS — Z8719 Personal history of other diseases of the digestive system: Secondary | ICD-10-CM | POA: Diagnosis not present

## 2015-06-18 DIAGNOSIS — Z87891 Personal history of nicotine dependence: Secondary | ICD-10-CM | POA: Insufficient documentation

## 2015-06-18 DIAGNOSIS — F419 Anxiety disorder, unspecified: Secondary | ICD-10-CM | POA: Diagnosis not present

## 2015-06-18 DIAGNOSIS — Z87828 Personal history of other (healed) physical injury and trauma: Secondary | ICD-10-CM | POA: Diagnosis not present

## 2015-06-18 NOTE — ED Provider Notes (Signed)
CSN: [id number]     Arrival date & time 06/18/15  0601 History   First MD Initiated Contact with Patient 06/18/15 0602     Chief Complaint  Patient presents with  . Seizures   HPI  Mr. Cullom is a 50 year old male with PMHx of MR, TBI, seizures, spastic quadriplegia, depression and bipolar disorder presenting with seizures. Patient's caregiver is at bedside and provides most of the history. She states that the patient typically has a few "jerking seizures" per week. She states that typically he begins to have jerking motions of the upper extremities which resolve after receiving clonazepam. She states that he is usually "kind of out of it" for about 15 minutes after his seizures. His seizure today lasted less than 2 minutes. She states that today he was "out of it and not really answering me" for approximately 30 minutes. She states this is longer than usual for his postictal phase. She states that during this time he was staring towards the ceiling and was not responding to her verbal stimuli. He was breathing during this time. She called her employer who recommended calling EMS. She called EMS and states that he began returning to his baseline on their arrival. She states that she "thinks" he is at his baseline now. He is wheelchair bound and did not fall when he had the seizure. Caregiver states that he has not missed any of his medications. He takes carbatrol and lamictal. Patient reports no pain complaints. He and his caregiver deny fevers, chills, headache, cold symptoms, chest pain, shortness of breath, cough, abdominal pain, nausea, vomiting, diarrhea, dysuria, myalgias or arthralgias. Caregiver denies any changes from his baseline before his seizure.   Past Medical History  Diagnosis Date  . 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 Depression   . 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 Closed head injury   . Seizures ([place])   . Peptic ulcer disease   .  Esophageal ulcer   . Malnutrition ([place])   . Constipation   . BPH (benign prostatic hyperplasia)   . Smoker   . Bipolar disorder ([place])   . Anxiety    Past Surgical History  Procedure Laterality Date  . Gastrectomy    . Esophagogastroduodenoscopy  06/17/2011    Procedure: ESOPHAGOGASTRODUODENOSCOPY (EGD);  Surgeon: Wonda Horner, MD;  Location: [facility];  Service: Endoscopy;  Laterality: N/A;   History reviewed. No pertinent family history. Social History  Substance Use Topics  . Smoking status: Former Smoker -- 0.50 packs/day    Quit date: 05/26/2010  . Smokeless tobacco: Former Systems developer    Quit date: 06/08/2001  . Alcohol Use: No    Review of Systems  Neurological: Positive for seizures.  All other systems reviewed and are negative.     Allergies  Pineapple  Home Medications   Prior to Admission medications   Medication Sig Start Date End Date Taking? Authorizing Provider  carbamazepine (CARBATROL) 300 MG 12 hr capsule Take 300 mg by mouth 2 (two) times daily.    Historical Provider, MD  carbamazepine (CARBATROL) 300 MG 12 hr capsule TAKE ONE CAPSULE BY MOUTH TWICE DAILY 02/26/15   Kathrynn Ducking, MD  carbamazepine (TEGRETOL-XR) 200 MG 12 hr tablet 1 tablet in the morning, 2 in the evening 04/10/15   Kathrynn Ducking, MD  escitalopram (LEXAPRO) 10 MG tablet Take 10 mg by mouth daily.    Historical Provider, MD  ferrous sulfate 325 (  65 FE) MG tablet Take 1 tablet (325 mg total) by mouth daily with breakfast. 02/23/15   Robbie Lis, MD  LamoTRIgine 100 MG TB24 Take 1 tablet by mouth every morning.    Historical Provider, MD  LamoTRIgine 100 MG TB24 TAKE ONE TABLET BY MOUTH EVERY DAY 02/26/15   Kathrynn Ducking, MD  LamoTRIgine XR 200 MG TB24 Take 1 tablet by mouth at bedtime.    Historical Provider, MD  LamoTRIgine XR 200 MG TB24 TAKE ONE TABLET BY MOUTH EVERY DAY 02/26/15   Kathrynn Ducking, MD  pantoprazole (PROTONIX) 40 MG tablet Take 40 mg by mouth daily.    Historical  Provider, MD  QUEtiapine (SEROQUEL) 100 MG tablet Take 150 mg by mouth at bedtime. 02/02/15   Historical Provider, MD  risperiDONE (RISPERDAL) 2 MG tablet Take 2 mg by mouth 2 (two) times daily.     Historical Provider, MD  Tamsulosin HCl (FLOMAX) 0.4 MG CAPS Take 0.4 mg by mouth at bedtime.     Historical Provider, MD  traZODone (DESYREL) 50 MG tablet Take 50 mg by mouth at bedtime.    Historical Provider, MD  zolpidem (AMBIEN) 10 MG tablet Take 10 mg by mouth at bedtime.    Historical Provider, MD   BP 106/71 mmHg  Pulse 98  Temp(Src) 97.8 F (36.6 C) (Oral)  Resp 20  SpO2 94% Physical Exam  Constitutional: He is oriented to person, place, and time. He appears well-developed and well-nourished. No distress.  Chronically ill appearing  HENT:  Head: Normocephalic and atraumatic.  Mouth/Throat: Oropharynx is clear and moist.  No tongue lac  Eyes: Conjunctivae and EOM are normal. Pupils are equal, round, and reactive to light. Right eye exhibits no discharge. Left eye exhibits no discharge. No scleral icterus.  Neck: Normal range of motion. Neck supple.  Cardiovascular: Normal rate and regular rhythm.   Pulmonary/Chest: Effort normal and breath sounds normal. No respiratory distress.  Abdominal: Soft. There is no tenderness.  Musculoskeletal: Normal range of motion.  Restricted ROM of the upper and lower extremities.   Neurological: He is alert and oriented to person, place, and time. No cranial nerve deficit. Coordination normal.  Alert; oriented to person and place but not date. Cranial nerves 3-12 tested and intact. Slightly dysarthric speech. 5/5 grip and ankle strength. Sensation to light touch intact throughout. Dysmetric finger to nose. Review of neurology notes show this is patient's baseline.   Skin: Skin is warm and dry.  Psychiatric: He has a normal mood and affect. His behavior is normal.  Nursing note and vitals reviewed.   ED Course  Procedures (including critical care  time) Labs Review Labs Reviewed - No data to display  Imaging Review No results found. I have personally reviewed and evaluated these images and lab results as part of my medical decision-making.   EKG Interpretation None      MDM   Final diagnoses:  Seizures ([place])   Patient presenting after a witnessed seizure. Seizure lasted approximately 2 minutes and resolved with clonazepam. Patient has history of seizures. This seizure was typical but his caregiver reports a prolonged post-ictal phase. VSS. Patient with no evidence of focal neuro deficits on physical exam and is at mental baseline. Pt is to follow up with neurology in the next few days regarding today's hospital visit. Patient and caregiver verbalize understanding. Answered all questions. Patient is hemodynamically stable and in no acute distress prior to discharge. Dr. Venora Maples has evaluated patient as a  shared visit and agrees with assessment and plan to discharge. Return precautions given in discharge paperwork and discussed with pt at bedside. Pt stable for discharge      Josephina Gip, PA-C 06/18/15 [place], MD 06/20/15 1326

## 2015-06-18 NOTE — ED Notes (Signed)
Patient here from home with with complaints of seizure activity. Given one of clonazepam. CBG 97. Hypotensive on scene. Hx of seizure and TBI.

## 2015-06-18 NOTE — ED Notes (Signed)
Bed: WA11 Expected date:  Expected time:  Means of arrival:  Comments: EMS-seizure 

## 2015-06-18 NOTE — Discharge Instructions (Signed)
Continue taking all of your home medications at prescribed. Schedule a follow up appointment with your neurologist to discuss today's visit. Return to the ED with any new, concerning or worsening symptoms.    Seizure, Adult A seizure is abnormal electrical activity in the brain. Seizures usually last from 30 seconds to 2 minutes. There are various types of seizures. Before a seizure, you may have a warning sensation (aura) that a seizure is about to occur. An aura may include the following symptoms:   Fear or anxiety.  Nausea.  Feeling like the room is spinning (vertigo).  Vision changes, such as seeing flashing lights or spots. Common symptoms during a seizure include:  A change in attention or behavior (altered mental status).  Convulsions with rhythmic jerking movements.  Drooling.  Rapid eye movements.  Grunting.  Loss of bladder and bowel control.  Bitter taste in the mouth.  Tongue biting. After a seizure, you may feel confused and sleepy. You may also have an injury resulting from convulsions during the seizure. HOME CARE INSTRUCTIONS   If you are given medicines, take them exactly as prescribed by your health care provider.  Keep all follow-up appointments as directed by your health care provider.  Do not swim or drive or engage in risky activity during which a seizure could cause further injury to you or others until your health care provider says it is OK.  Get adequate rest.  Teach friends and family what to do if you have a seizure. They should:  Lay you on the ground to prevent a fall.  Put a cushion under your head.  Loosen any tight clothing around your neck.  Turn you on your side. If vomiting occurs, this helps keep your airway clear.  Stay with you until you recover.  Know whether or not you need emergency care. SEEK IMMEDIATE MEDICAL CARE IF:  The seizure lasts longer than 5 minutes.  The seizure is severe or you do not wake up immediately  after the seizure.  You have an altered mental status after the seizure.  You are having more frequent or worsening seizures. Someone should drive you to the emergency department or call local emergency services (911 in [place] MAKE SURE YOU:  Understand these instructions.  Will watch your condition.  Will get help right away if you are not doing well or get worse.   This information is not intended to replace advice given to you by your health care provider. Make sure you discuss any questions you have with your health care provider.   Document Released: 05/09/2000 Document Revised: 06/02/2014 Document Reviewed: 12/22/2012 Elsevier Interactive Patient Education [employer].

## 2015-06-18 NOTE — ED Notes (Signed)
[facility] called  

## 2015-06-22 ENCOUNTER — Ambulatory Visit (INDEPENDENT_AMBULATORY_CARE_PROVIDER_SITE_OTHER): Payer: Medicare Other | Admitting: Neurology

## 2015-06-22 ENCOUNTER — Encounter: Payer: Self-pay | Admitting: Neurology

## 2015-06-22 VITALS — BP 98/66 | HR 91

## 2015-06-22 DIAGNOSIS — G40309 Generalized idiopathic epilepsy and epileptic syndromes, not intractable, without status epilepticus: Secondary | ICD-10-CM | POA: Diagnosis not present

## 2015-06-22 DIAGNOSIS — Z5181 Encounter for therapeutic drug level monitoring: Secondary | ICD-10-CM

## 2015-06-22 DIAGNOSIS — G8250 Quadriplegia, unspecified: Secondary | ICD-10-CM

## 2015-06-22 DIAGNOSIS — S0990XS Unspecified injury of head, sequela: Secondary | ICD-10-CM

## 2015-06-22 NOTE — Progress Notes (Signed)
Reason for visit: Seizures  [name] is an 50 y.o. male  History of present illness:  [name] is a 29 year old right-handed white male with a history of a closed head injury, spastic quadriparesis, and a residual seizure disorder. The patient is on carbamazepine and Lamictal. The patient generally has been well controlled with the seizures, he has not had an episode in over 2 years. The patient had a seizure event that occurred 4 days ago. The patient began having myoclonic jerks with subsequent loss of consciousness. The patient was not responding for least an hour afterwards, he went to [facility] for an evaluation. No blood work was done. The patient did fully recover. In November 2016, the generic manufacture of Carbatrol was changed. The patient has not missed any doses of medications, he has not been sick. When last seen, the patient was noted to have a significant iron deficiency anemia with a hemoglobin of around 6.0. This apparently has been corrected. The patient returns to this office for an evaluation.  Past Medical History  Diagnosis Date  . 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 Depression   . 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 Closed head injury   . Seizures ([place])   . Peptic ulcer disease   . Esophageal ulcer   . Malnutrition ([place])   . Constipation   . BPH (benign prostatic hyperplasia)   . Smoker   . Bipolar disorder ([place])   . Anxiety     Past Surgical History  Procedure Laterality Date  . Gastrectomy    . Esophagogastroduodenoscopy  06/17/2011    Procedure: ESOPHAGOGASTRODUODENOSCOPY (EGD);  Surgeon: Wonda Horner, MD;  Location: [facility];  Service: Endoscopy;  Laterality: N/A;    History reviewed. No pertinent family history.  Social history:  reports that he quit smoking about 5 years ago. He quit smokeless tobacco use about 14 years ago. He reports that he does not drink alcohol or use illicit  drugs.    Allergies  Allergen Reactions  . Pineapple Anaphylaxis    Medications:  Prior to Admission medications   Medication Sig Start Date End Date Taking? Authorizing Provider  carbamazepine (CARBATROL) 300 MG 12 hr capsule Take 300 mg by mouth 2 (two) times daily.   Yes Historical Provider, MD  clonazePAM (KLONOPIN) 0.5 MG tablet Take 0.5 mg by mouth 2 (two) times daily as needed for anxiety.   Yes Historical Provider, MD  escitalopram (LEXAPRO) 10 MG tablet Take 10 mg by mouth daily.   Yes Historical Provider, MD  ferrous sulfate 325 (65 FE) MG tablet Take 1 tablet (325 mg total) by mouth daily with breakfast. 02/23/15  Yes Robbie Lis, MD  LamoTRIgine 100 MG TB24 Take 1 tablet by mouth every morning.   Yes Historical Provider, MD  LamoTRIgine XR 200 MG TB24 Take 1 tablet by mouth at bedtime.   Yes Historical Provider, MD  pantoprazole (PROTONIX) 40 MG tablet Take 40 mg by mouth daily.   Yes Historical Provider, MD  polyethylene glycol (MIRALAX / GLYCOLAX) packet Take 17 g by mouth daily.   Yes Historical Provider, MD  risperiDONE (RISPERDAL) 2 MG tablet Take 2 mg by mouth 2 (two) times daily.    Yes Historical Provider, MD  Tamsulosin HCl (FLOMAX) 0.4 MG CAPS Take 0.4 mg by mouth at bedtime.    Yes Historical Provider, MD  traZODone (DESYREL) 50 MG tablet Take 50 mg by mouth  at bedtime.   Yes Historical Provider, MD  zolpidem (AMBIEN) 10 MG tablet Take 10 mg by mouth at bedtime.   Yes Historical Provider, MD    ROS:  Out of a complete 14 system review of symptoms, the patient complains only of the following symptoms, and all other reviewed systems are negative.  Seizures Gait disorder  Blood pressure 98/66, pulse 91.  Physical Exam  General: The patient is alert and cooperative at the time of the examination.  Skin: No significant peripheral edema is noted.   Neurologic Exam  Mental status: The patient is alert and oriented x 3 at the time of the  examination.   Cranial nerves: Facial symmetry is present. Speech is slightly dysarthric, not aphasic. Extraocular movements are full, with exception of divergence of gaze, exotropia with the right eye. Visual fields are full.  Motor: The patient has good strength in the upper extremities. With the lower extremities, the patient has 4/5 strength bilaterally, increased tone on both legs.  Sensory examination: Soft touch sensation is symmetric on the face, arms, and legs.  Coordination: The patient has some dysmetria with finger-nose-finger bilaterally, the patient has difficulty performing heel-to-shin on both sides..  Gait and station: The patient attempted ambulation. The patient can stand with assistance, with attempted ambulation, the patient is very unsteady, requires full assistance to walk.  Reflexes: Deep tendon reflexes are symmetric.   Assessment/Plan:  1. Closed head injury  2. Spastic quadriparesis  4. Seizure disorder  The patient has had a recent seizure event. It is possible that the switch of generic manufactures of the Carbatrol may have resulted in a change in blood level of the carbamazepine. The patient will have blood work today. We may need to make medication adjustments depending upon the results of the blood work. The patient will follow-up for his next scheduled revisit in September 2017.  Jill Alexanders MD 06/22/2015 4:45 PM  [facility] [address] [place], [place] [address]  Phone [phone] Fax [phone]

## 2015-06-22 NOTE — Patient Instructions (Signed)

## 2015-06-26 ENCOUNTER — Telehealth: Payer: Self-pay | Admitting: Neurology

## 2015-06-26 LAB — COMPREHENSIVE METABOLIC PANEL
ALT: 43 IU/L (ref 0–44)
AST: 27 IU/L (ref 0–40)
Albumin/Globulin Ratio: 1.4 (ref 1.1–2.5)
Albumin: 3.8 g/dL (ref 3.5–5.5)
Alkaline Phosphatase: 138 IU/L — ABNORMAL HIGH (ref 39–117)
BUN/Creatinine Ratio: 27 — ABNORMAL HIGH (ref 9–20)
BUN: 24 mg/dL (ref 6–24)
Bilirubin Total: <0.2 mg/dL (ref 0.0–1.2)
CALCIUM: 9.3 mg/dL (ref 8.7–10.2)
CO2: 28 mmol/L (ref 18–29)
CREATININE: 0.88 mg/dL (ref 0.76–1.27)
Chloride: 99 mmol/L (ref 96–106)
GFR calc Af Amer: 118 mL/min/{1.73_m2} (ref >59)
GFR, EST NON AFRICAN AMERICAN: 102 mL/min/{1.73_m2} (ref >59)
GLUCOSE: 111 mg/dL — AB (ref 65–99)
Globulin, Total: 2.8 g/dL (ref 1.5–4.5)
Potassium: 4.9 mmol/L (ref 3.5–5.2)
SODIUM: 144 mmol/L (ref 134–144)
Total Protein: 6.6 g/dL (ref 6.0–8.5)

## 2015-06-26 LAB — CBC WITH DIFFERENTIAL/PLATELET
BASOS: 0 %
Basophils Absolute: 0 10*3/uL (ref 0.0–0.2)
EOS (ABSOLUTE): 0.1 10*3/uL (ref 0.0–0.4)
EOS: 2 %
HEMATOCRIT: 39 % (ref 37.5–51.0)
HEMOGLOBIN: 12.8 g/dL (ref 12.6–17.7)
IMMATURE GRANS (ABS): 0 10*3/uL (ref 0.0–0.1)
Immature Granulocytes: 0 %
LYMPHS: 15 %
Lymphocytes Absolute: 1 10*3/uL (ref 0.7–3.1)
MCH: 30.6 pg (ref 26.6–33.0)
MCHC: 32.8 g/dL (ref 31.5–35.7)
MCV: 93 fL (ref 79–97)
MONOCYTES: 10 %
MONOS ABS: 0.6 10*3/uL (ref 0.1–0.9)
NEUTROS PCT: 73 %
Neutrophils Absolute: 4.7 10*3/uL (ref 1.4–7.0)
Platelets: 243 10*3/uL (ref 150–379)
RBC: 4.18 x10E6/uL (ref 4.14–5.80)
RDW: 15.3 % (ref 12.3–15.4)
WBC: 6.5 10*3/uL (ref 3.4–10.8)

## 2015-06-26 LAB — CARBAMAZEPINE LEVEL, TOTAL: Carbamazepine (Tegretol), S: 7.9 ug/mL (ref 4.0–12.0)

## 2015-06-26 LAB — LAMOTRIGINE LEVEL: LAMOTRIGINE LVL: 6.7 ug/mL (ref 2.0–20.0)

## 2015-06-26 MED ORDER — LAMOTRIGINE ER 200 MG PO TB24: 2.0000 | ORAL_TABLET | Freq: Every day | ORAL | Status: DC

## 2015-06-26 NOTE — Telephone Encounter (Signed)
I called [facility]. According to Dr. Jannifer Franklin' note from today the patient should take 400 mg Lamotrigine XR at night. She asked if we could send a D/C order for the 100 mg tablet and an order for the 200 mg XR tablets to [phone].

## 2015-06-26 NOTE — Telephone Encounter (Signed)
The order for the lamotrigine medication change will be given. The patient is on the extended release lamotrigine, can be taken once a day.

## 2015-06-26 NOTE — Telephone Encounter (Signed)
I called with the results of the blood work. The blood work was unremarkable, both the carbamazepine level and lamotrigine level were in the therapeutic range, we do have room to increase the lamotrigine, will go from 300 mg to 400 mg daily. I will call in a prescription for the 200 mg XR tablet taking 2 at night. The carbamazepine level has not dropped much relative to prior blood work evaluations.

## 2015-06-26 NOTE — Telephone Encounter (Signed)
[facility] [phone] called to get clarification on LamoTRIgine XR 200 MG TB24. States patient was on 100mg  during the day and 200mg  at night, new Rx is 2-200mg  (400mg ) at bedtime. Is patient only to take at bedtime with no daytime dosing?

## 2015-07-05 ENCOUNTER — Ambulatory Visit: Payer: Medicare Other | Admitting: Neurology

## 2015-07-14 ENCOUNTER — Other Ambulatory Visit: Payer: Self-pay | Admitting: Neurology

## 2015-08-14 ENCOUNTER — Encounter: Payer: Self-pay | Admitting: Adult Health

## 2015-08-14 ENCOUNTER — Ambulatory Visit (INDEPENDENT_AMBULATORY_CARE_PROVIDER_SITE_OTHER): Payer: Medicare Other | Admitting: Adult Health

## 2015-08-14 VITALS — BP 101/72 | HR 80

## 2015-08-14 DIAGNOSIS — S0990XS Unspecified injury of head, sequela: Secondary | ICD-10-CM

## 2015-08-14 DIAGNOSIS — R569 Unspecified convulsions: Secondary | ICD-10-CM

## 2015-08-14 DIAGNOSIS — G8250 Quadriplegia, unspecified: Secondary | ICD-10-CM | POA: Diagnosis not present

## 2015-08-14 NOTE — Patient Instructions (Signed)
Continue Carbamazepine and lamictal  If your symptoms worsen or you develop new symptoms please let us know.

## 2015-08-14 NOTE — Progress Notes (Signed)
PATIENT: [name] DOB: [birth year]  REASON FOR VISIT: follow up- closed head injury, spastic quadriparesis, seizure disorder HISTORY FROM: patient  HISTORY OF PRESENT ILLNESS: [name] is a 50 year old male with a history of closed head injury, spastic quadriparesis and seizure disorder. He returns today for an evaluation. The patient's Lamictal was increased at the last visit. He reports that he is not had any additional seizure events. He is tolerating the increase in medications well. His caregiver states that he recently had blood work in [place]. I do not have access to this. The caregiver states that for several weeks he's noticed that the patient has been leaning to the left and that has subsequently caused some drooling. He states that he is followed with his primary care and she placed him on medication? However he is unsure what the medication is. The patient has participated in physical therapy in the past for his gait. According to the caregiver he is  limited to the wheelchair. He is able to complete most ADLs independently or with minimal assistance. He is able to feed himself. He returns today for an evaluation.  HISTORY 06/22/15 ([place]ILLIS): [name] is a 44 year old right-handed white male with a history of a closed head injury, spastic quadriparesis, and a residual seizure disorder. The patient is on carbamazepine and Lamictal. The patient generally has been well controlled with the seizures, he has not had an episode in over 2 years. The patient had a seizure event that occurred 4 days ago. The patient began having myoclonic jerks with subsequent loss of consciousness. The patient was not responding for least an hour afterwards, he went to [facility] for an evaluation. No blood work was done. The patient did fully recover. In November 2016, the generic manufacture of Carbatrol was changed. The patient has not missed any doses of medications, he has not been sick. When last  seen, the patient was noted to have a significant iron deficiency anemia with a hemoglobin of around 6.0. This apparently has been corrected. The patient returns to this office for an evaluation.   REVIEW OF SYSTEMS: Out of a complete 14 system review of symptoms, the patient complains only of the following symptoms, and all other reviewed systems are negative.  See history of present illness  ALLERGIES: Allergies  Allergen Reactions  . Pineapple Anaphylaxis    HOME MEDICATIONS: Outpatient Prescriptions Prior to Visit  Medication Sig Dispense Refill  . carbamazepine (CARBATROL) 300 MG 12 hr capsule Take 300 mg by mouth 2 (two) times daily.    . clonazePAM (KLONOPIN) 0.5 MG tablet Take 0.5 mg by mouth 2 (two) times daily as needed for anxiety.    Marland Kitchen escitalopram (LEXAPRO) 10 MG tablet Take 10 mg by mouth daily.    . ferrous sulfate 325 (65 FE) MG tablet Take 1 tablet (325 mg total) by mouth daily with breakfast. 30 tablet 0  . LamoTRIgine XR 200 MG TB24 Take 2 tablets (400 mg total) by mouth at bedtime. 60 tablet 5  . pantoprazole (PROTONIX) 40 MG tablet Take 40 mg by mouth daily.    . polyethylene glycol (MIRALAX / GLYCOLAX) packet Take 17 g by mouth daily.    . risperiDONE (RISPERDAL) 2 MG tablet Take 2 mg by mouth 2 (two) times daily.     . Tamsulosin HCl (FLOMAX) 0.4 MG CAPS Take 0.4 mg by mouth at bedtime.     . traZODone (DESYREL) 50 MG tablet Take 50 mg by mouth  at bedtime.    Marland Kitchen zolpidem (AMBIEN) 10 MG tablet Take 10 mg by mouth at bedtime.     No facility-administered medications prior to visit.    PAST MEDICAL HISTORY: Past Medical History  Diagnosis Date  . 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 Depression   . 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 Closed head injury   . Seizures ([place])   . Peptic ulcer disease   . Esophageal ulcer   . Malnutrition ([place])   . Constipation   . BPH (benign prostatic hyperplasia)   . Smoker    . Bipolar disorder ([place])   . Anxiety     PAST SURGICAL HISTORY: Past Surgical History  Procedure Laterality Date  . Gastrectomy    . Esophagogastroduodenoscopy  06/17/2011    Procedure: ESOPHAGOGASTRODUODENOSCOPY (EGD);  Surgeon: Wonda Horner, MD;  Location: [facility];  Service: Endoscopy;  Laterality: N/A;    FAMILY HISTORY: History reviewed. No pertinent family history.  SOCIAL HISTORY: Social History   Social History  . Marital Status: Single    Spouse Name: N/A  . Number of Children: N/A  . Years of Education: N/A   Occupational History  . Not on file.   Social History Main Topics  . Smoking status: Former Smoker -- 0.50 packs/day    Quit date: 05/26/2010  . Smokeless tobacco: Former Systems developer    Quit date: 06/08/2001  . Alcohol Use: No  . Drug Use: No  . Sexual Activity: No   Other Topics Concern  . Not on file   Social History Narrative   Patient does not drink caffeine.   Patient is right handed.       PHYSICAL EXAM  Filed Vitals:   08/14/15 1049  BP: 101/72  Pulse: 80   There is no weight on file to calculate BMI.  Generalized: Well developed, in no acute distress   Neurological examination  Mentation: Alert oriented to time, place, history taking. Follows all commands  Cranial nerve II-XII:  Extraocular movements were full with the exception of a divergent gaze. Facial sensation and strength were normal. Facial symmetry is present Uvula tongue midline. Head turning and shoulder shrug  were normal and symmetric. Motor: The motor testing reveals 4 over 5 strength of all 4 extremities slightly weaker in the left upper extremity.   Sensory: Sensory testing is intact to soft touch on all 4 extremities. No evidence of extinction is noted.  Coordination: Cerebellar testing reveals dysmetria finger-nose-finger and difficulty with heel-to-shin bilaterally.  Gait and station: Patient is in a wheelchair. Marland Kitchen   DIAGNOSTIC DATA (LABS, IMAGING, TESTING) -  I reviewed patient records, labs, notes, testing and imaging myself where available.  Lab Results  Component Value Date   WBC 6.5 06/22/2015   HGB 8.3* 02/23/2015   HCT 39.0 06/22/2015   MCV 93 06/22/2015   PLT 243 06/22/2015      Component Value Date/Time   NA 144 06/22/2015 0859   NA 144 02/23/2015 0550   K 4.9 06/22/2015 0859   CL 99 06/22/2015 0859   CO2 28 06/22/2015 0859   GLUCOSE 111* 06/22/2015 0859   GLUCOSE 79 02/23/2015 0550   BUN 24 06/22/2015 0859   BUN 10 02/23/2015 0550   CREATININE 0.88 06/22/2015 0859   CALCIUM 9.3 06/22/2015 0859   PROT 6.6 06/22/2015 0859   PROT 6.7 02/22/2015 1456   ALBUMIN 3.8 06/22/2015 0859   ALBUMIN 3.3* 02/22/2015 1456  AST 27 06/22/2015 0859   ALT 43 06/22/2015 0859   ALKPHOS 138* 06/22/2015 0859   BILITOT <0.2 06/22/2015 0859   BILITOT 0.2* 02/22/2015 1456   GFRNONAA 102 06/22/2015 0859   GFRAA 118 06/22/2015 0859        ASSESSMENT AND PLAN 50 y.o. year old male  has a past medical history of Mental retardation, mild (I.Q. 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 here with:  1. Seizures 2. Spastic quadriparesis 3. History of closed head injury  The patient has not had any additional seizure events since increasing the Lamictal. He will continue on Lamictal and carbamazepine. I have asked his caregiver to have his recent blood work faxed to our office. The patient is now leaning to the left. According to the caregiver his primary care has ordered medication when this was addressed with her? The patient may perhaps need physical therapy to help with this. A CT of the head may also be needed. Caregiver would like to follow-up with his primary care provider before any additional testing  or treatment. The patient will follow-up in 6 months or sooner if needed.  Bruce Givens, MSN, NP-C 08/14/2015, 11:01 AM [facility] [address], [place], [place] [address] [phone]

## 2015-08-14 NOTE — Progress Notes (Signed)
I have read the note, and I agree with the clinical assessment and plan.  WILLIS,CHARLES KEITH   

## 2015-08-25 ENCOUNTER — Other Ambulatory Visit: Payer: Self-pay | Admitting: Neurology

## 2015-09-03 ENCOUNTER — Encounter: Payer: Self-pay | Admitting: Neurology

## 2015-09-03 ENCOUNTER — Ambulatory Visit (INDEPENDENT_AMBULATORY_CARE_PROVIDER_SITE_OTHER): Payer: Medicare Other | Admitting: Neurology

## 2015-09-03 VITALS — BP 90/63 | HR 87 | Ht 73.0 in | Wt 165.0 lb

## 2015-09-03 DIAGNOSIS — E538 Deficiency of other specified B group vitamins: Secondary | ICD-10-CM

## 2015-09-03 DIAGNOSIS — S0990XS Unspecified injury of head, sequela: Secondary | ICD-10-CM

## 2015-09-03 DIAGNOSIS — G8250 Quadriplegia, unspecified: Secondary | ICD-10-CM | POA: Diagnosis not present

## 2015-09-03 DIAGNOSIS — G40309 Generalized idiopathic epilepsy and epileptic syndromes, not intractable, without status epilepticus: Secondary | ICD-10-CM

## 2015-09-03 DIAGNOSIS — R269 Unspecified abnormalities of gait and mobility: Secondary | ICD-10-CM | POA: Diagnosis not present

## 2015-09-03 DIAGNOSIS — G800 Spastic quadriplegic cerebral palsy: Secondary | ICD-10-CM

## 2015-09-03 NOTE — Progress Notes (Signed)
Reason for visit: Gait disorder  [name] is an 50 y.o. male  History of present illness:  [name] is a 46 year old right-handed white male with a history of a traumatic brain injury and associated quadriparesis, and a gait disorder. The patient has had a change in his functional level that has been most significant over the last one month. The patient has lost his ability to ambulate, he is leaning to the left side. He has had a tendency to lean for about one year. He is on 4 mg daily Risperdal. He has a seizure disorder, he has been well controlled on his current dosing of Lamictal and carbamazepine. He last had blood work done in January 2017. The patient has become a bit more sleepy. He is eating and drinking well. He is starting to drool. The patient is sent back to this office for an evaluation.  Past Medical History  Diagnosis Date  . 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 Depression   . 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 Closed head injury   . Seizures ([place])   . Peptic ulcer disease   . Esophageal ulcer   . Malnutrition ([place])   . Constipation   . BPH (benign prostatic hyperplasia)   . Smoker   . Bipolar disorder ([place])   . Anxiety     Past Surgical History  Procedure Laterality Date  . Gastrectomy    . Esophagogastroduodenoscopy  06/17/2011    Procedure: ESOPHAGOGASTRODUODENOSCOPY (EGD);  Surgeon: Wonda Horner, MD;  Location: [facility];  Service: Endoscopy;  Laterality: N/A;    History reviewed. No pertinent family history.  Social history:  reports that he quit smoking about 5 years ago. He quit smokeless tobacco use about 14 years ago. He reports that he does not drink alcohol or use illicit drugs.    Allergies  Allergen Reactions  . Pineapple Anaphylaxis    Medications:  Prior to Admission medications   Medication Sig Start Date End Date Taking? Authorizing Provider  carbamazepine (CARBATROL) 300 MG 12  hr capsule TAKE 1 CAPSULE BY MOUTH 2 TIMES DAILY 08/27/15  Yes Kathrynn Ducking, MD  clonazePAM (KLONOPIN) 0.5 MG tablet Take 0.5 mg by mouth 2 (two) times daily as needed for anxiety.   Yes Historical Provider, MD  escitalopram (LEXAPRO) 10 MG tablet Take 10 mg by mouth daily.   Yes Historical Provider, MD  ferrous sulfate 325 (65 FE) MG tablet Take 1 tablet (325 mg total) by mouth daily with breakfast. 02/23/15  Yes Robbie Lis, MD  LamoTRIgine XR 200 MG TB24 Take 2 tablets (400 mg total) by mouth at bedtime. 06/26/15  Yes Kathrynn Ducking, MD  pantoprazole (PROTONIX) 40 MG tablet Take 40 mg by mouth daily.   Yes Historical Provider, MD  polyethylene glycol (MIRALAX / GLYCOLAX) packet Take 17 g by mouth daily.   Yes Historical Provider, MD  risperiDONE (RISPERDAL) 2 MG tablet Take 2 mg by mouth 2 (two) times daily.    Yes Historical Provider, MD  Tamsulosin HCl (FLOMAX) 0.4 MG CAPS Take 0.4 mg by mouth at bedtime.    Yes Historical Provider, MD  traZODone (DESYREL) 50 MG tablet Take 50 mg by mouth at bedtime.   Yes Historical Provider, MD  zolpidem (AMBIEN) 10 MG tablet Take 10 mg by mouth at bedtime.   Yes Historical Provider, MD    ROS:  Out of a complete 14 system review  of symptoms, the patient complains only of the following symptoms, and all other reviewed systems are negative.  Decreased activity, fatigue Drooling Cough, shortness of breath Abdominal pain, constipation Snoring Frequency of urination Joint pain, back pain, walking difficulty Weakness   Blood pressure 90/63, pulse 87, height 6\' 1"  (1.854 m), weight 165 lb (74.844 kg).  Physical Exam  General: The patient is alert and cooperative at the time of the examination.  Skin: No significant peripheral edema is noted.   Neurologic Exam  Mental status: The patient is alert and cooperative at the time of examination. The patient is wheelchair bound.   Cranial nerves: Facial symmetry is present. Speech is dysarthric.  Extraocular movements are full, But there is exotropia of the left eye. Visual fields are full.  Motor: The patient has good strength in all 4 extremities.  Sensory examination: Soft touch sensation is symmetric on the face, arms, and legs.  Coordination: The patient has some dysmetria with finger-nose-finger bilaterally, the patient has difficulty performing heel-to-shin on both sides.  Gait and station: The patient is wheelchair-bound, the patient can stand with assistance, unable to effectively walk. The patient is quite unsteady, needs support with standing.  Reflexes: Deep tendon reflexes are symmetric.   Assessment/Plan:  1. Traumatic brain injury   2. History of seizures   3. Gait disorder   The patient has had a change in his functional capacity. The patient has lost his ability to ambulate within the last month. The patient is drooling, leaning to the left. The patient may have features of parkinsonism related to Risperdal. I would recommend that this medication be tapered off, and another medication such as Seroquel be used instead if needed. The patient will be sent for blood work today, he will have a CT scan of the head. He will follow-up in 3 months.   Jill Alexanders MD 09/03/2015 8:12 PM  [facility] [address] [place] [employer], [place] [address]  Phone [phone] Fax [phone]

## 2015-09-05 ENCOUNTER — Telehealth: Payer: Self-pay | Admitting: Neurology

## 2015-09-05 LAB — LAMOTRIGINE LEVEL: LAMOTRIGINE LVL: 4.3 ug/mL (ref 2.0–20.0)

## 2015-09-05 LAB — COMPREHENSIVE METABOLIC PANEL
ALK PHOS: 184 IU/L — AB (ref 39–117)
ALT: 67 IU/L — ABNORMAL HIGH (ref 0–44)
AST: 52 IU/L — AB (ref 0–40)
Albumin/Globulin Ratio: 1.3 (ref 1.2–2.2)
Albumin: 3.6 g/dL (ref 3.5–5.5)
BUN / CREAT RATIO: 22 — AB (ref 9–20)
BUN: 21 mg/dL (ref 6–24)
CHLORIDE: 103 mmol/L (ref 96–106)
CO2: 28 mmol/L (ref 18–29)
CREATININE: 0.95 mg/dL (ref 0.76–1.27)
Calcium: 9.3 mg/dL (ref 8.7–10.2)
GFR calc Af Amer: 109 mL/min/{1.73_m2} (ref >59)
GFR calc non Af Amer: 94 mL/min/{1.73_m2} (ref >59)
GLOBULIN, TOTAL: 2.8 g/dL (ref 1.5–4.5)
Glucose: 82 mg/dL (ref 65–99)
POTASSIUM: 4.9 mmol/L (ref 3.5–5.2)
SODIUM: 144 mmol/L (ref 134–144)
Total Protein: 6.4 g/dL (ref 6.0–8.5)

## 2015-09-05 LAB — CBC WITH DIFFERENTIAL/PLATELET
BASOS ABS: 0 10*3/uL (ref 0.0–0.2)
Basos: 1 %
EOS (ABSOLUTE): 0.1 10*3/uL (ref 0.0–0.4)
EOS: 1 %
Hematocrit: 35 % — ABNORMAL LOW (ref 37.5–51.0)
Hemoglobin: 11.4 g/dL — ABNORMAL LOW (ref 12.6–17.7)
IMMATURE GRANULOCYTES: 0 %
Immature Grans (Abs): 0 10*3/uL (ref 0.0–0.1)
LYMPHS ABS: 0.8 10*3/uL (ref 0.7–3.1)
Lymphs: 18 %
MCH: 32.8 pg (ref 26.6–33.0)
MCHC: 32.6 g/dL (ref 31.5–35.7)
MCV: 101 fL — ABNORMAL HIGH (ref 79–97)
MONOS ABS: 0.6 10*3/uL (ref 0.1–0.9)
Monocytes: 13 %
NEUTROS PCT: 67 %
Neutrophils Absolute: 3 10*3/uL (ref 1.4–7.0)
PLATELETS: 217 10*3/uL (ref 150–379)
RBC: 3.48 x10E6/uL — AB (ref 4.14–5.80)
RDW: 15.8 % — AB (ref 12.3–15.4)
WBC: 4.4 10*3/uL (ref 3.4–10.8)

## 2015-09-05 LAB — SEDIMENTATION RATE: SED RATE: 40 mm/h — AB (ref 0–15)

## 2015-09-05 LAB — COPPER, SERUM: COPPER: 121 ug/dL (ref 72–166)

## 2015-09-05 LAB — VITAMIN B12: VITAMIN B 12: 743 pg/mL (ref 211–946)

## 2015-09-05 LAB — CARBAMAZEPINE LEVEL, TOTAL: CARBAMAZEPINE LVL: 11.1 ug/mL (ref 4.0–12.0)

## 2015-09-05 LAB — AMMONIA: Ammonia: 78 ug/dL (ref 27–102)

## 2015-09-05 NOTE — Telephone Encounter (Signed)
I called the Center. The blood work shows a mild anemia, the patient has had severe anemia in the past, this will need to be followed. The liver enzymes are elevated, we'll recheck blood work in 4-6 weeks. The patient will be seeing his psychiatrist today, I have requested a taper off of the Risperdal.

## 2015-09-10 ENCOUNTER — Ambulatory Visit
Admission: RE | Admit: 2015-09-10 | Discharge: 2015-09-10 | Disposition: A | Discharge status: Home / Self Care | Payer: Medicare Other | Source: Ambulatory Visit | Attending: Neurology | Admitting: Neurology

## 2015-09-10 ENCOUNTER — Telehealth: Payer: Self-pay | Admitting: Neurology

## 2015-09-10 DIAGNOSIS — G40309 Generalized idiopathic epilepsy and epileptic syndromes, not intractable, without status epilepticus: Secondary | ICD-10-CM

## 2015-09-10 DIAGNOSIS — R269 Unspecified abnormalities of gait and mobility: Secondary | ICD-10-CM

## 2015-09-10 DIAGNOSIS — G8250 Quadriplegia, unspecified: Secondary | ICD-10-CM

## 2015-09-10 DIAGNOSIS — S0990XS Unspecified injury of head, sequela: Secondary | ICD-10-CM

## 2015-09-10 NOTE — Telephone Encounter (Signed)
I called and spoke with a caretaker. The CT the head shows no change from 2013.  CT head 09/10/15:  IMPRESSION: This CT scan of the head without contrast shows the following:  1. 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 2. Stable severe cerebellar atrophy 3. There are no acute findings.

## 2015-10-09 ENCOUNTER — Telehealth: Payer: Self-pay | Admitting: Neurology

## 2015-10-09 DIAGNOSIS — Z5181 Encounter for therapeutic drug level monitoring: Secondary | ICD-10-CM

## 2015-10-09 NOTE — Telephone Encounter (Signed)
I called and talked with a caretaker. The patient has had liver enzyme elevations, some anemia on last blood work, we will recheck this. The patient is to come in.

## 2015-10-10 ENCOUNTER — Other Ambulatory Visit (INDEPENDENT_AMBULATORY_CARE_PROVIDER_SITE_OTHER): Payer: Self-pay

## 2015-10-10 DIAGNOSIS — Z5181 Encounter for therapeutic drug level monitoring: Secondary | ICD-10-CM

## 2015-10-10 DIAGNOSIS — Z0289 Encounter for other administrative examinations: Secondary | ICD-10-CM

## 2015-10-11 ENCOUNTER — Telehealth: Payer: Self-pay | Admitting: Neurology

## 2015-10-11 LAB — CBC WITH DIFFERENTIAL/PLATELET
Basophils Absolute: 0 10*3/uL (ref 0.0–0.2)
Basos: 0 %
EOS (ABSOLUTE): 0.1 10*3/uL (ref 0.0–0.4)
Eos: 2 %
Hematocrit: 38.4 % (ref 37.5–51.0)
Hemoglobin: 12.4 g/dL — ABNORMAL LOW (ref 12.6–17.7)
Immature Grans (Abs): 0 10*3/uL (ref 0.0–0.1)
Immature Granulocytes: 0 %
Lymphocytes Absolute: 1 10*3/uL (ref 0.7–3.1)
Lymphs: 19 %
MCH: 32.4 pg (ref 26.6–33.0)
MCHC: 32.3 g/dL (ref 31.5–35.7)
MCV: 100 fL — ABNORMAL HIGH (ref 79–97)
Monocytes Absolute: 0.4 10*3/uL (ref 0.1–0.9)
Monocytes: 8 %
Neutrophils Absolute: 3.9 10*3/uL (ref 1.4–7.0)
Neutrophils: 71 %
Platelets: 217 10*3/uL (ref 150–379)
RBC: 3.83 x10E6/uL — ABNORMAL LOW (ref 4.14–5.80)
RDW: 13.7 % (ref 12.3–15.4)
WBC: 5.4 10*3/uL (ref 3.4–10.8)

## 2015-10-11 LAB — COMPREHENSIVE METABOLIC PANEL
A/G RATIO: 1.3 (ref 1.2–2.2)
ALBUMIN: 3.9 g/dL (ref 3.5–5.5)
ALK PHOS: 213 IU/L — AB (ref 39–117)
ALT: 73 IU/L — ABNORMAL HIGH (ref 0–44)
AST: 35 IU/L (ref 0–40)
BUN / CREAT RATIO: 19 (ref 9–20)
BUN: 16 mg/dL (ref 6–24)
CHLORIDE: 98 mmol/L (ref 96–106)
CO2: 26 mmol/L (ref 18–29)
Calcium: 9.5 mg/dL (ref 8.7–10.2)
Creatinine, Ser: 0.85 mg/dL (ref 0.76–1.27)
GFR calc Af Amer: 119 mL/min/{1.73_m2} (ref >59)
GFR calc non Af Amer: 103 mL/min/{1.73_m2} (ref >59)
GLUCOSE: 99 mg/dL (ref 65–99)
Globulin, Total: 3 g/dL (ref 1.5–4.5)
POTASSIUM: 4.5 mmol/L (ref 3.5–5.2)
Sodium: 140 mmol/L (ref 134–144)
Total Protein: 6.9 g/dL (ref 6.0–8.5)

## 2015-10-11 NOTE — Telephone Encounter (Signed)
I called regarding the blood work results. The alk phosphatase and SGPT are still elevated, relatively stable. SGOT is now normal. The hemoglobin is still low, slightly improved. Overall, blood work is stable to slightly improved.

## 2015-11-19 ENCOUNTER — Other Ambulatory Visit: Payer: Self-pay | Admitting: Neurology

## 2015-12-06 ENCOUNTER — Encounter: Payer: Self-pay | Admitting: Adult Health

## 2015-12-06 ENCOUNTER — Ambulatory Visit (INDEPENDENT_AMBULATORY_CARE_PROVIDER_SITE_OTHER): Payer: Medicare Other | Admitting: Adult Health

## 2015-12-06 VITALS — BP 89/60 | HR 72 | Ht 73.0 in | Wt 165.0 lb

## 2015-12-06 DIAGNOSIS — Z87820 Personal history of traumatic brain injury: Secondary | ICD-10-CM

## 2015-12-06 DIAGNOSIS — Z5181 Encounter for therapeutic drug level monitoring: Secondary | ICD-10-CM | POA: Diagnosis not present

## 2015-12-06 DIAGNOSIS — G8250 Quadriplegia, unspecified: Secondary | ICD-10-CM | POA: Diagnosis not present

## 2015-12-06 DIAGNOSIS — R569 Unspecified convulsions: Secondary | ICD-10-CM | POA: Diagnosis not present

## 2015-12-06 NOTE — Progress Notes (Signed)
I have read the note, and I agree with the clinical assessment and plan.  WILLIS,CHARLES KEITH   

## 2015-12-06 NOTE — Patient Instructions (Signed)
Continue Carbamazepine and lamitcal  Blood work today If your symptoms worsen or you develop new symptoms please let us know.

## 2015-12-06 NOTE — Progress Notes (Signed)
PATIENT: [name] DOB: 07-19-1965  REASON FOR VISIT: follow up-traumatic brain injury, quadriparesis, gait disorder, seizures  HISTORY FROM: patient  HISTORY OF PRESENT ILLNESS: [name] is a 50 year old male with a history of traumatic brain injury, quadriparesis, gait disorder and seizures. He returns today for follow-up. The patient states that he is not had any additional seizures. He is tolerating Lamictal and carbamazepine well. His caregiver is with him today. She reports that his drooling and leaning to the left has actually improved. He is still on Risperdal. Patient uses a wheelchair for ambulation. Denies any new neurological symptoms. He returns today for an evaluation.  HISTORY 09/03/15: [name] is a 59 year old right-handed white male with a history of a traumatic brain injury and associated quadriparesis, and a gait disorder. The patient has had a change in his functional level that has been most significant over the last one month. The patient has lost his ability to ambulate, he is leaning to the left side. He has had a tendency to lean for about one year. He is on 4 mg daily Risperdal. He has a seizure disorder, he has been well controlled on his current dosing of Lamictal and carbamazepine. He last had blood work done in January 2017. The patient has become a bit more sleepy. He is eating and drinking well. He is starting to drool. The patient is sent back to this office for an evaluation.  REVIEW OF SYSTEMS: Out of a complete 14 system review of symptoms, the patient complains only of the following symptoms, and all other reviewed systems are negative.  See history of present illness  ALLERGIES: Allergies  Allergen Reactions  . Pineapple Anaphylaxis    HOME MEDICATIONS: Outpatient Prescriptions Prior to Visit  Medication Sig Dispense Refill  . carbamazepine (CARBATROL) 300 MG 12 hr capsule TAKE 1 CAPSULE BY MOUTH 2 TIMES DAILY 180 capsule 3  . clonazePAM  (KLONOPIN) 0.5 MG tablet Take 0.5 mg by mouth 2 (two) times daily as needed for anxiety.    Marland Kitchen escitalopram (LEXAPRO) 10 MG tablet Take 10 mg by mouth daily.    . LamoTRIgine XR 200 MG TB24 TAKE 2 TABLETS BY MOUTH AT BEDTIME 60 tablet 5  . pantoprazole (PROTONIX) 40 MG tablet Take 40 mg by mouth daily.    . polyethylene glycol (MIRALAX / GLYCOLAX) packet Take 17 g by mouth daily.    . risperiDONE (RISPERDAL) 2 MG tablet Take 2 mg by mouth 2 (two) times daily.     . Tamsulosin HCl (FLOMAX) 0.4 MG CAPS Take 0.4 mg by mouth at bedtime.     . traZODone (DESYREL) 50 MG tablet Take 50 mg by mouth at bedtime.    Marland Kitchen zolpidem (AMBIEN) 10 MG tablet Take 10 mg by mouth at bedtime.    . ferrous sulfate 325 (65 FE) MG tablet Take 1 tablet (325 mg total) by mouth daily with breakfast. 30 tablet 0   No facility-administered medications prior to visit.    PAST MEDICAL HISTORY: Past Medical History  Diagnosis Date  . 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 Depression   . 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 Closed head injury   . Seizures ([place])   . Peptic ulcer disease   . Esophageal ulcer   . Malnutrition ([place])   . Constipation   . BPH (benign prostatic hyperplasia)   . Smoker   . Bipolar disorder ([place])   . Anxiety  PAST SURGICAL HISTORY: Past Surgical History  Procedure Laterality Date  . Gastrectomy    . Esophagogastroduodenoscopy  06/17/2011    Procedure: ESOPHAGOGASTRODUODENOSCOPY (EGD);  Surgeon: Wonda Horner, MD;  Location: [facility];  Service: Endoscopy;  Laterality: N/A;    FAMILY HISTORY: History reviewed. No pertinent family history.  SOCIAL HISTORY: Social History   Social History  . Marital Status: Single    Spouse Name: N/A  . Number of Children: N/A  . Years of Education: N/A   Occupational History  . Not on file.   Social History Main Topics  . Smoking status: Former Smoker -- 0.50 packs/day    Quit date:  05/26/2010  . Smokeless tobacco: Former Systems developer    Quit date: 06/08/2001  . Alcohol Use: No  . Drug Use: No  . Sexual Activity: No   Other Topics Concern  . Not on file   Social History Narrative   Patient lives at home alone w/ caretakers.   Patient does not drink caffeine.   Patient is right handed.       PHYSICAL EXAM  Filed Vitals:   12/06/15 1325  BP: 89/60  Pulse: 72  Height: 6\' 1"  (1.854 m)  Weight: 165 lb (74.844 kg)   Body mass index is 21.77 kg/(m^2).  Generalized: Well developed, in no acute distress   Neurological examination  Mentation: Alert oriented to time, place, history taking. Follows all commands speech and language fluent Cranial nerve II-XII: Pupils were equal round reactive to light. Extraocular movements were full, visual field were full on confrontational test. Facial sensation and strength were normal. Uvula tongue midline. Head turning and shoulder shrug  were normal and symmetric. Motor: The motor testing reveals 5 over 5 strength of all 4 extremities. Good symmetric motor tone is noted throughout.  Sensory: Sensory testing is intact to soft touch on all 4 extremities. No evidence of extinction is noted.  Coordination: Cerebellar testing reveals good finger-nose-finger and heel-to-shin bilaterally.  Gait and station: Wheelchair bound.  Reflexes: Deep tendon reflexes are symmetric and normal bilaterally.   DIAGNOSTIC DATA (LABS, IMAGING, TESTING) - I reviewed patient records, labs, notes, testing and imaging myself where available.  Lab Results  Component Value Date   WBC 5.4 10/10/2015   HGB 8.3* 02/23/2015   HCT 38.4 10/10/2015   MCV 100* 10/10/2015   PLT 217 10/10/2015      Component Value Date/Time   NA 140 10/10/2015 1300   NA 144 02/23/2015 0550   K 4.5 10/10/2015 1300   CL 98 10/10/2015 1300   CO2 26 10/10/2015 1300   GLUCOSE 99 10/10/2015 1300   GLUCOSE 79 02/23/2015 0550   BUN 16 10/10/2015 1300   BUN 10 02/23/2015 0550    CREATININE 0.85 10/10/2015 1300   CALCIUM 9.5 10/10/2015 1300   PROT 6.9 10/10/2015 1300   PROT 6.7 02/22/2015 1456   ALBUMIN 3.9 10/10/2015 1300   ALBUMIN 3.3* 02/22/2015 1456   AST 35 10/10/2015 1300   ALT 73* 10/10/2015 1300   ALKPHOS 213* 10/10/2015 1300   BILITOT <0.2 10/10/2015 1300   BILITOT 0.2* 02/22/2015 1456   GFRNONAA 103 10/10/2015 1300   GFRAA 119 10/10/2015 1300     ASSESSMENT AND PLAN 50 y.o. year old male  has a past medical history of Mental retardation, mild (I.Q. 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 here with:  1. Seizures 2. History of traumatic brain injury 3. Quadriparesis 4. Abnormality of gait  The patient is doing well. He will continue on carbamazepine and Lamictal. I will recheck blood work today. Patient's Caregiver reports that his drooling and leaning to the left have actually improved. We will continue to monitor. Patient will follow-up in 6 months with Dr. Jannifer Franklin.    Ward Givens, MSN, NP-C 12/06/2015, 1:34 PM [facility] [address], [place], [place] [address] [phone]

## 2015-12-10 ENCOUNTER — Telehealth: Payer: Self-pay

## 2015-12-10 LAB — COMPREHENSIVE METABOLIC PANEL
A/G RATIO: 1.2 (ref 1.2–2.2)
ALT: 37 IU/L (ref 0–44)
AST: 26 IU/L (ref 0–40)
Albumin: 3.6 g/dL (ref 3.5–5.5)
Alkaline Phosphatase: 171 IU/L — ABNORMAL HIGH (ref 39–117)
BUN/Creatinine Ratio: 23 — ABNORMAL HIGH (ref 9–20)
BUN: 19 mg/dL (ref 6–24)
CALCIUM: 9.3 mg/dL (ref 8.7–10.2)
CHLORIDE: 99 mmol/L (ref 96–106)
CO2: 28 mmol/L (ref 18–29)
Creatinine, Ser: 0.84 mg/dL (ref 0.76–1.27)
GFR, EST AFRICAN AMERICAN: 120 mL/min/{1.73_m2} (ref >59)
GFR, EST NON AFRICAN AMERICAN: 104 mL/min/{1.73_m2} (ref >59)
GLOBULIN, TOTAL: 2.9 g/dL (ref 1.5–4.5)
Glucose: 91 mg/dL (ref 65–99)
POTASSIUM: 4.3 mmol/L (ref 3.5–5.2)
SODIUM: 141 mmol/L (ref 134–144)
TOTAL PROTEIN: 6.5 g/dL (ref 6.0–8.5)

## 2015-12-10 LAB — CBC WITH DIFFERENTIAL/PLATELET
BASOS: 1 %
Basophils Absolute: 0 10*3/uL (ref 0.0–0.2)
EOS (ABSOLUTE): 0.1 10*3/uL (ref 0.0–0.4)
Eos: 2 %
Hematocrit: 36.4 % — ABNORMAL LOW (ref 37.5–51.0)
Hemoglobin: 11.7 g/dL — ABNORMAL LOW (ref 12.6–17.7)
Immature Grans (Abs): 0 10*3/uL (ref 0.0–0.1)
Immature Granulocytes: 0 %
LYMPHS ABS: 1.2 10*3/uL (ref 0.7–3.1)
Lymphs: 31 %
MCH: 31 pg (ref 26.6–33.0)
MCHC: 32.1 g/dL (ref 31.5–35.7)
MCV: 96 fL (ref 79–97)
MONOS ABS: 0.5 10*3/uL (ref 0.1–0.9)
Monocytes: 14 %
NEUTROS ABS: 2 10*3/uL (ref 1.4–7.0)
Neutrophils: 52 %
PLATELETS: 225 10*3/uL (ref 150–379)
RBC: 3.78 x10E6/uL — ABNORMAL LOW (ref 4.14–5.80)
RDW: 13.2 % (ref 12.3–15.4)
WBC: 3.8 10*3/uL (ref 3.4–10.8)

## 2015-12-10 LAB — CARBAMAZEPINE LEVEL, TOTAL: Carbamazepine (Tegretol), S: 8.3 ug/mL (ref 4.0–12.0)

## 2015-12-10 LAB — LAMOTRIGINE LEVEL: Lamotrigine Lvl: 3.1 ug/mL (ref 2.0–20.0)

## 2015-12-10 NOTE — Telephone Encounter (Signed)
Called w/ unremarkable lab results. Verbalized understanding and appreciation for call. 

## 2015-12-10 NOTE — Telephone Encounter (Signed)
-----   Message from Ward Givens, NP sent at 12/10/2015  8:48 AM EDT ----- Blood work relatively unremarkable. Alkaline phosphatase has improved. Please call the patient with results.

## 2015-12-13 ENCOUNTER — Ambulatory Visit: Payer: Medicare Other | Attending: Nurse Practitioner | Admitting: Rehabilitative and Restorative Service Providers"

## 2015-12-13 DIAGNOSIS — R293 Abnormal posture: Secondary | ICD-10-CM | POA: Diagnosis present

## 2015-12-13 DIAGNOSIS — M6281 Muscle weakness (generalized): Secondary | ICD-10-CM | POA: Insufficient documentation

## 2015-12-13 DIAGNOSIS — R2689 Other abnormalities of gait and mobility: Secondary | ICD-10-CM | POA: Insufficient documentation

## 2015-12-13 NOTE — Therapy (Signed)
[facility] [address] [place], [place], [address] Phone: [phone]   Fax:  [phone]  Patient Details  Name: DALONTE HARDAGE MRN: [id number] Date of Birth: [birth year] Referring Provider:  Barrie Lyme, FNP  Encounter Date: 12/13/2015  Mobility/Seating Evaluation    PATIENT INFORMATION: Name: Gwynn Crossley DOB: 03-16-1966  Sex: M Date seen: 12/13/2015 Time: 0945  Address:  [place], [place] Physician: Gwenlyn Perking, NP This evaluation/justification form will serve as the LMN for the following suppliers: __________________________ Supplier: [facility] Contact Person: Felton Clinton, ATP Phone:  ?????   Seating Therapist: Rudell Cobb, MPT Phone:   [phone]   Phone: [phone]    Spouse/Parent/Caregiver name: Ritta Slot- [employer]  Phone number: ????? Insurance/Payer: medicare/medicaid secondary     Reason for Referral: obtain new manual w/c;  patient wishes to be able to use his feet to propel manual w/c, current chair too short for height  Patient/Caregiver Goals: Obtain new manual w/c; caregiver requests chest harness, support for L leaning, and foot rests  Patient was seen for face-to-face evaluation for new manual wheelchair.  Also present was [employer], ATP and caregiver to discuss recommendations and wheelchair options.  Further paperwork was completed and sent to vendor.  Patient appears to qualify for manual mobility device at this time per objective findings.   MEDICAL HISTORY: Diagnosis: Primary Diagnosis: traumatic brain injury, quadriplegia Onset: 8460 (when 50 years old) Diagnosis: seizures   [] Progressive Disease Relevant past and future surgeries: none   Height: 6'1" Weight: 165 lbs Explain recent changes or trends in weight: n/a   History including Falls: Recent fall on 12/10/2015 with minor elbow injuries; caregiver assistance 24 hours/day to help prevent falls     HOME ENVIRONMENT: [x] House  [] Condo/town home  [] Apartment  [] Assisted Living    [x] Lives Alone []  Lives with Others                                                                                          Hours with caregiver: 24 hrs/day caregiver support through agency  [x] Home is accessible to patient           Stairs      [] Yes [x]  No     Ramp [x] Yes [] No Comments:  current w/c is tight fit for bathroom, however everywhere else accessible   COMMUNITY ADL: TRANSPORTATION: [x] Car    [x] Van    [] Public Transportation    [] Adapted w/c Lift    [] Ambulance    [] Other:       [x] Sits in wheelchair during transport  Employment/School: ????? Specific requirements pertaining to mobility ?????  Other: Sits in w/c at times for transport (through agency [employer]); will need tie down hooks for safe transportation    FUNCTIONAL/SENSORY PROCESSING SKILLS:  Handedness:   [x] Right     [] Left    [] NA  Comments:  ?????  Functional Processing Skills for Wheeled Mobility [] Processing Skills are adequate for safe wheelchair operation  Areas of concern than may interfere with safe operation of wheelchair Description of problem   [x]  Attention to environment      [  x]Judgment      []  Hearing  []  Vision or visual processing      [] Motor Planning  [x]  Fluctuations in Behavior  requesting manual w/c versus power    VERBAL COMMUNICATION: [] WFL receptive []  WFL expressive [] Understandable  [x] Difficult to understand  [] non-communicative []  Uses an augmented communication device  CURRENT SEATING / MOBILITY: Current Mobility Base:  [] None [] Dependent [x] Manual [] Scooter [] Power  Type of Control: ?????  Manufacturer:  Trinda Pascal:  ?????Age: 8 years  Current Condition of Mobility Base:  tread worn on tires, casters worn, arm pads need maintenance, seat sling is hammocked, L wheel lock in disrepair   Current Wheelchair components:  manual w/c with exendable breaks  Describe posture in present seating system:  left  leaning, hips rotated      SENSATION and SKIN ISSUES: Sensation [x] Intact  [] Impaired [] Absent  Level of sensation: ????? Pressure Relief: Able to perform effective pressure relief :    [] Yes  [x]  No Method: caregivers help with moving patient frequently for skin protection If not, Why?: unsafe to stand alone, initiating pressure relief on his own would be challenge  Skin Issues/Skin Integrity Current Skin Issues  [] Yes [x] No [] Intact []  Red area[]  Open Area  [] Scar Tissue [] At risk from prolonged sitting Where  ?????  History of Skin Issues  [x] Yes [] No Where  sacrum When  "a long time ago"  Hx of skin flap surgeries  [] Yes [x] No Where  ????? When  ?????  Limited sitting tolerance [x] Yes [] No Hours spent sitting in wheelchair daily: 12 hours/day-- needs help for pressure relief from caregivers  Complaint of Pain:  Please describe: none   Swelling/Edema: none   ADL STATUS (in reference to wheelchair use):  Indep Assist Unable Indep with Equip Not assessed Comments  Dressing ????? x ????? ????? ????? ?????  Eating ????? x ????? ????? ????? ?????  Toileting ????? x ????? ????? ????? ?????  Bathing ????? x ????? ????? ????? ?????  Grooming/Hygiene ????? x ????? ????? ????? ?????  Meal Prep ????? ????? x ????? ????? ?????  IADLS ????? ????? x ????? ????? ?????  Bowel Management: [x] Continent  [] Incontinent  [] Accidents Comments:  ?????  Bladder Management: [] Continent  [] Incontinent  [] Accidents Comments:  incontinence issues mostly in middle of night.     WHEELCHAIR SKILLS: Manual w/c Propulsion: [] UE or LE strength and endurance sufficient to participate in ADLs using manual wheelchair Arm : [] left [] right   [] Both      Distance: ????? Foot:  [] left [] right   [] Both  Operate Scooter: []  Strength, hand grip, balance and transfer appropriate for use [] Living environment is accessible for use of scooter  Operate Power w/c:  []  Std. Joystick   []  Alternative Controls  Indep []  Assist []  Dependent/unable []  N/A []   [] Safe          []  Functional      Distance: ?????  Bed confined without wheelchair []  Yes []  No   STRENGTH/RANGE OF MOTION:  ????? Range of Motion Strength  Shoulder Mild tightness noted with shoulder flexion; reaches to approximately 120 degrees A/ROM supine 3/5 *shoulder strength significantly impaired by diminished trunk control and forward flexion  Elbow WFLs WFLs  Wrist/Hand WFLs 3/5 with dyscoordination noted in hands  Hip tightness noted in hip flexors 3/5 *impaired further by diminsihed trunk control and forward flexion  Knee tightness in bilateral hamstrings, however will not impact positioning in w/c 3/5 knee flexion/extension  Ankle WFLs 3/5      MOBILITY/BALANCE:  [  x] Patient is totally dependent for mobility  ?????    Balance Transfers Ambulation  Sitting Balance: Standing Balance: []  Independent []  Independent/Modified Independent  []  WFL     []  WFL []  Supervision []  Supervision  [x]  Uses UE for balance  []  Supervision []  Min Assist [x]  Ambulates with Assist  with parallel bars at day program and caregiver assist    [x]  Min Assist []  Min assist [x]  Mod Assist []  Ambulates with Device:      []  RW  []  StW  []  Cane  []  ?????  []  Mod Assist []  Mod assist []  Max assist   []  Max Assist [x]  Max assist []  Dependent []  Indep. Short Distance Only  []  Unable []  Unable []  Lift / Sling Required Distance (in feet)  ?????   []  Sliding board []  Unable to Ambulate (see explanation below)  Cardio Status:  [x] Intact  []  Impaired   []  NA     ?????  Respiratory Status:  [x] Intact   [] Impaired   [] NA     ?????  Orthotics/Prosthetics: none  Comments (Address manual vs power w/c vs scooter): Patient currently in manual w/c and would not be able to safely drive a power w/c.  Patient needs updated manual w/c due to state of current manual w/c and patient size/positioning.  His left leaning of trunk appears to be progressing and getting new w/c  will help support  his trunk to prevent further muscular shortening.  Patient depends on caregivers for all mobility. He is at risk to fall due to dyscoordination.         Anterior / Posterior Obliquity Rotation-Pelvis In w/c, patient has R elevated hip position    Left leaning with L concave/R convex curve with apex at lumbar spine  PELVIS    []  [x]  []   Neutral Posterior Anterior  []  [x]  []   WFL Rt elev Lt elev  []  [x]  []   WFL Right Left                      Anterior    Anterior     []  Fixed []  Other [x]  Partly Flexible []  Flexible   []  Fixed []  Other [x]  Partly Flexible  []  Flexible  []  Fixed []  Other [x]  Partly Flexible  []  Flexible   TRUNK  []  [x]  []   WFL ? Thoracic ? Lumbar  Kyphosis Lordosis  []  [x]  []   WFL Convex Convex  Right Left [] c-curve [] s-curve [] multiple  []  Neutral []  Left-anterior [x]  Right-anterior     []  Fixed []  Flexible [x]  Partly Flexible []  Other  []  Fixed []  Flexible [x]  Partly Flexible []  Other  []  Fixed             []  Flexible [x]  Partly Flexible []  Other    Position Windswept  mild L rotation of hips   HIPS          [x]            []               []    Neutral       Abduct        ADduct         []           []            []   Neutral Right           Left      []  Fixed []  Subluxed []  Partly  Flexible []  Dislocated []  Flexible  []  Fixed []  Other []  Partly Flexible  []  Flexible                 Foot Positioning Knee Positioning  tightness in hamstrings, should not impact positioning    [x]  WFL  [] Lt [] Rt [x]  WFL  [] Lt [] Rt    KNEES ROM concerns: ROM concerns:    & Dorsi-Flexed [] Lt [] Rt ?????    FEET Plantar Flexed [] Lt [] Rt      Inversion                 [] Lt [] Rt      Eversion                 [] Lt [] Rt     HEAD [x]  Functional []  Good Head Control  leans forward when trunk flexes forward  & [x]  Flexed         []  Extended [x]  Adequate Head Control    NECK []  Rotated  Lt  []  Lat Flexed Lt []  Rotated  Rt []  Lat Flexed Rt []  Limited  Head Control     []  Cervical Hyperextension []  Absent  Head Control     SHOULDERS ELBOWS WRIST& HAND ?????      Left     Right    Left     Right    Left     Right   U/E [x] Functional           [x] Functional WFLs WFLs [] Fisting             [] Fisting      [] elev   [] dep      [] elev   [] dep       [] pro -[] retract     [] pro  [] retract [] subluxed             [] subluxed           Goals for Wheelchair Mobility  [x]  Independence with mobility in the home with motor related ADLs (MRADLs)  []  Independence with MRADLs in the community [x]  Provide dependent mobility  []  Provide recline     [] Provide tilt   Goals for Seating system [x]  Optimize pressure distribution [x]  Provide support needed to facilitate function or safety [x]  Provide corrective forces to assist with maintaining or improving posture [x]  Accommodate client's posture:   current seated postures and positions are not flexible or will not tolerate corrective forces []  Client to be independent with relieving pressure in the wheelchair [] Enhance physiological function such as breathing, swallowing, digestion  Simulation ideas/Equipment trials:manual w/c State why other equipment was unsuccessful:unable to operate power   MOBILITY BASE RECOMMENDATIONS and JUSTIFICATION: MOBILITY COMPONENT JUSTIFICATION  Manufacturer: [employer] Model: Catalyst 5   Size: Width 18Seat Depth 20 [x] provide transport from point A to B      [x] promote Indep mobility  [x] is not a safe, functional ambulator [x] walker or cane inadequate [x] non-standard width/depth necessary to accommodate anatomical measurement [x]  long leg length  [x] Manual Mobility Base [x] non-functional ambulator    [] Scooter/POV  [] can safely operate  [] can safely transfer   [] has adequate trunk stability  [] cannot functionally propel manual w/c  [] Power Mobility Base  [] non-ambulatory  [] cannot functionally propel manual wheelchair  []  cannot functionally and safely operate  scooter/POV [] can safely operate and willing to  [] Stroller Base [] infant/child  [] unable to propel manual wheelchair [] allows for growth [] non-functional ambulator [] non-functional UE [] Indep mobility is not a goal at this time  [] Tilt  [] Forward [] Backward []   Powered tilt  [] Manual tilt  [] change position against gravitational force on head and shoulders  [] change position for pressure relief/cannot weight shift [] transfers  [] management of tone [] rest periods [] control edema [] facilitate postural control  []  ?????  [] Recline  [] Power recline on power base [] Manual recline on manual base  [] accommodate femur to back angle  [] bring to full recline for ADL care  [] change position for pressure relief/cannot weight shift [] rest periods [] repositioning for transfers or clothing/diaper /catheter changes [] head positioning  [x] Lighter weight required [x] self- propulsion  [x] lifting []  ?????  [] Heavy Duty required [] user weight greater than 250# [] extreme tone/ over active movement [] broken frame on previous chair []  ?????  [x]  Back  []  Angle Adjustable []  Custom molded Jay 3 deep contour [x] postural control [] control of tone/spasticity [x] accommodation of range of motion [x] UE functional control [x] accommodation for seating system []  ????? [x] provide lateral trunk support [] accommodate deformity [x] provide posterior trunk support [] provide lumbar/sacral support [x] support trunk in midline [] Pressure relief over spinal processes  [x]  Seat [place] M2 [] impaired sensation  [] decubitus ulcers present [x] history of pressure ulceration [x] prevent pelvic extension [x] low maintenance  [x] stabilize pelvis  [x] accommodate obliquity [] accommodate multiple deformity [x] neutralize lower extremity position [x] increase pressure distribution []  ?????  []  Pelvic/thigh support  []  Lateral thigh guide []  Distal medial pad  []  Distal lateral pad []  pelvis in neutral  [] accommodate pelvis []  position upper legs []  alignment []  accommodate ROM []  decr adduction [] accommodate tone [] removable for transfers [] decr abduction  []  Lateral trunk Supports []  Lt     []  Rt [] decrease lateral trunk leaning [] control tone [] contour for increased contact [] safety  [] accommodate asymmetry []  ?????  []  Mounting hardware  [] lateral trunk supports  [] back   [] seat [] headrest      []  thigh support [] fixed   [] swing away [] attach seat platform/cushion to w/c frame [] attach back cushion to w/c frame [] mount postural supports [] mount headrest  [] swing medial thigh support away [] swing lateral supports away for transfers  []  ?????    Armrests  [] fixed [x] adjustable height [x] removable   [] swing away  [] flip back   [] reclining [x] full length pads [] desk    [] pads tubular  [x] provide support with elbow at 90   [] provide support for w/c tray [x] change of height/angles for variable activities [x] remove for transfers [x] allow to come closer to table top [x] remove for access to tables []  ?????  Hangers/ Leg rests  [x] 60 [] 70 [] 90 [] elevating [] heavy duty  [] articulating [] fixed [] lift off [x] swing away     [] power [x] provide LE support  [] accommodate to hamstring tightness [] elevate legs during recline   [] provide change in position for Legs [x] Maintain placement of feet on footplate [] durability [x] enable transfers [] decrease edema [x] Accommodate lower leg length []  ?????  Foot support Footplate    [x] Lt  [x]  Rt  []  Center mount [x] flip up     [] depth/angle adjustable [] Amputee adapter    []  Lt     []  Rt [x] provide foot support [] accommodate to ankle ROM [x] transfers [] Provide support for residual extremity []  allow foot to go under wheelchair base []  decrease tone  []  ?????  []  Ankle strap/heel loops [] support foot on foot support [] decrease extraneous movement [] provide input to heel  [] protect foot  Tires: [x] pneumatic  [x] flat free inserts   [] solid  [x] decrease maintenance  [x] prevent frequent flats [] increase shock absorbency [] decrease pain from road shock [] decrease spasms from road shock []  ?????  []  Headrest  [] provide posterior head support [] provide posterior neck support [] provide lateral head support [] provide anterior  head support [] support during tilt and recline [] improve feeding   [] improve respiration [] placement of switches [] safety  [] accommodate ROM  [] accommodate tone [] improve visual orientation  [x]  Anterior chest strap []  Vest []  Shoulder retractors  [x] decrease forward movement of shoulder [] accommodation of TLSO [x] decrease forward movement of trunk [] decrease shoulder elevation [x] added abdominal support [x] alignment [] assistance with shoulder control  []  ?????  Pelvic Positioner [x] Belt [] SubASIS bar [] Dual Pull [] stabilize tone [x] decrease falling out of chair/ **will not Decr potential for sliding due to pelvic tilting [x] prevent excessive rotation [] pad for protection over boney prominence [] prominence comfort [] special pull angle to control rotation []  ?????  Upper Extremity Support [] L   []  R [] Arm trough    [] hand support []  tray       [] full tray [] swivel mount [] decrease edema      [] decrease subluxation   [] control tone   [] placement for AAC/Computer/EADL [] decrease gravitational pull on shoulders [] provide midline positioning [] provide support to increase UE function [] provide hand support in natural position [] provide work surface   POWER WHEELCHAIR CONTROLS  [] Proportional  [] Non-Proportional Type ????? [] Left  [] Right [] provides access for controlling wheelchair   [] lacks motor control to operate proportional drive control [] unable to understand proportional controls  Actuator Control Module  [] Single  [] Multiple   [] Allow the client to operate the power seat function(s) through the joystick control   [] Safety Reset Switches [] Used to change modes and stop  the wheelchair when driving in latch mode    [] Upgraded Electronics   [] programming for accurate control [] progressive Disease/changing condition [] non-proportional drive control needed [] Needed in order to operate power seat functions through joystick control   [] Display box [] Allows user to see in which mode and drive the wheelchair is set  [] necessary for alternate controls    [] Digital interface electronics [] Allows w/c to operate when using alternative drive controls  [] ASL Head Array [] Allows client to operate wheelchair  through switches placed in tri-panel headrest  [] Sip and puff with tubing kit [] needed to operate sip and puff drive controls  [] Upgraded tracking electronics [] increase safety when driving [] correct tracking when on uneven surfaces  [] Mount for switches or joystick [] Attaches switches to w/c  [] Swing away for access or transfers [] midline for optimal placement [] provides for consistent access  [] Attendant controlled joystick plus mount [] safety [] long distance driving [] operation of seat functions [] compliance with transportation regulations []  ?????    Rear wheel placement/Axle adjustability [] None [] semi adjustable [] fully adjustable  [] improved UE access to wheels [] improved stability [] changing angle in space for improvement of postural stability [] 1-arm drive access [] amputee pad placement []  ?????  Wheel rims/ hand rims  [] metal  [x] plastic coated [] oblique projections [] vertical projections [x] Provide ability to propel manual wheelchair  [x]  Increase self-propulsion with hand weakness/decreased grasp  Push handles [] extended  [] angle adjustable  [x] standard [x] caregiver access [x] caregiver assist [] allows "hooking" to enable increased ability to perform ADLs or maintain balance  One armed device  [] Lt   [] Rt [] enable propulsion of manual wheelchair with one arm   []  ?????   Brake/wheel lock extension [x]  Lt   [x]  Rt [x] increase indep in applying  wheel locks   [] Side guards [] prevent clothing getting caught in wheel or becoming soiled []  prevent skin tears/abrasions  Battery: ????? [] to power wheelchair ?????  Other: heel loop for foot support and positioning ?????  The above equipment has a life- long use expectancy. Growth and changes in medical and/or functional conditions would be the exceptions. This is to certify that the  therapist has no financial relationship with durable medical provider or manufacturer. The therapist will not receive remuneration of any kind for the equipment recommended in this evaluation.   Patient has mobility limitation that significantly impairs safe, timely participation in one or more mobility related ADL's.  (bathing, toileting, feeding, dressing, grooming, moving from room to room)                                                             []  Yes []  No Will mobility device sufficiently improve ability to participate and/or be aided in participation of MRADL's?         []  Yes []  No Can limitation be compensated for with use of a cane or walker?                                                                                []  Yes []  No Does patient or caregiver demonstrate ability/potential ability & willingness to safely use the mobility device?   []  Yes []  No Does patient's home environment support use of recommended mobility device?                                                    []  Yes []  No Does patient have sufficient upper extremity function necessary to functionally propel a manual wheelchair?    []  Yes []  No Does patient have sufficient strength and trunk stability to safely operate a POV (scooter)?                                  []  Yes []  No Does patient need additional features/benefits provided by a power wheelchair for MRADL's in the home?       []  Yes []  No Does the patient demonstrate the ability to safely use a power wheelchair?                                                               []  Yes []  No  Therapist Name Printed: Rudell Cobb, MPT Date: 12/13/2015  Therapist's Signature:   Date:   Supplier's Name Printed: [employer] Date: 12/13/2015  Supplier's Signature:   Date:  Patient/Caregiver Signature:   Date:     This is to certify that I have read this evaluation and do agree with the content within:    Physician's Name Printed: Gwenlyn Perking, NP  Physician's Signature:  Date:     This is to certify that I, the above signed therapist have the following affiliations: []  This DME provider []  Manufacturer  of recommended equipment []  Patient's long term care facility [x]  None of the above       Brielyn Bosak, PT 12/13/2015, 10:49 AM  [facility] [address] [place], [place], [address] Phone: [phone]   Fax:  [phone]

## 2015-12-24 ENCOUNTER — Ambulatory Visit: Payer: Medicare Other | Admitting: Adult Health

## 2016-02-14 ENCOUNTER — Ambulatory Visit: Payer: Medicare Other | Admitting: Adult Health

## 2016-02-20 ENCOUNTER — Ambulatory Visit: Payer: Medicare Other | Admitting: Adult Health

## 2016-04-22 ENCOUNTER — Inpatient Hospital Stay (HOSPITAL_COMMUNITY)
Admission: EM | Admit: 2016-04-22 | Discharge: 2016-04-25 | DRG: 871 | Disposition: A | Discharge status: Home / Self Care | Payer: Medicare Other | Attending: Internal Medicine | Admitting: Internal Medicine

## 2016-04-22 ENCOUNTER — Emergency Department (HOSPITAL_COMMUNITY): Payer: Medicare Other

## 2016-04-22 ENCOUNTER — Encounter (HOSPITAL_COMMUNITY): Payer: Self-pay | Admitting: Emergency Medicine

## 2016-04-22 DIAGNOSIS — D509 Iron deficiency anemia, unspecified: Secondary | ICD-10-CM | POA: Diagnosis present

## 2016-04-22 DIAGNOSIS — Z79899 Other long term (current) drug therapy: Secondary | ICD-10-CM | POA: Diagnosis not present

## 2016-04-22 DIAGNOSIS — I959 Hypotension, unspecified: Secondary | ICD-10-CM | POA: Diagnosis present

## 2016-04-22 DIAGNOSIS — J690 Pneumonitis due to inhalation of food and vomit: Secondary | ICD-10-CM | POA: Diagnosis present

## 2016-04-22 DIAGNOSIS — R05 Cough: Secondary | ICD-10-CM | POA: Diagnosis present

## 2016-04-22 DIAGNOSIS — J181 Lobar pneumonia, unspecified organism: Secondary | ICD-10-CM | POA: Diagnosis present

## 2016-04-22 DIAGNOSIS — Z87891 Personal history of nicotine dependence: Secondary | ICD-10-CM | POA: Diagnosis not present

## 2016-04-22 DIAGNOSIS — D6959 Other secondary thrombocytopenia: Secondary | ICD-10-CM | POA: Diagnosis present

## 2016-04-22 DIAGNOSIS — Z87820 Personal history of traumatic brain injury: Secondary | ICD-10-CM

## 2016-04-22 DIAGNOSIS — R4182 Altered mental status, unspecified: Secondary | ICD-10-CM

## 2016-04-22 DIAGNOSIS — R059 Cough, unspecified: Secondary | ICD-10-CM

## 2016-04-22 DIAGNOSIS — F329 Major depressive disorder, single episode, unspecified: Secondary | ICD-10-CM | POA: Diagnosis present

## 2016-04-22 DIAGNOSIS — F70 Mild intellectual disabilities: Secondary | ICD-10-CM | POA: Diagnosis present

## 2016-04-22 DIAGNOSIS — N400 Enlarged prostate without lower urinary tract symptoms: Secondary | ICD-10-CM | POA: Diagnosis present

## 2016-04-22 DIAGNOSIS — G40409 Other generalized epilepsy and epileptic syndromes, not intractable, without status epilepticus: Secondary | ICD-10-CM | POA: Diagnosis present

## 2016-04-22 DIAGNOSIS — G800 Spastic quadriplegic cerebral palsy: Secondary | ICD-10-CM | POA: Diagnosis present

## 2016-04-22 DIAGNOSIS — D649 Anemia, unspecified: Secondary | ICD-10-CM | POA: Diagnosis present

## 2016-04-22 DIAGNOSIS — A419 Sepsis, unspecified organism: Secondary | ICD-10-CM | POA: Diagnosis present

## 2016-04-22 DIAGNOSIS — J189 Pneumonia, unspecified organism: Secondary | ICD-10-CM | POA: Diagnosis present

## 2016-04-22 DIAGNOSIS — F32A Depression, unspecified: Secondary | ICD-10-CM | POA: Diagnosis present

## 2016-04-22 DIAGNOSIS — F319 Bipolar disorder, unspecified: Secondary | ICD-10-CM | POA: Diagnosis present

## 2016-04-22 DIAGNOSIS — L8990 Pressure ulcer of unspecified site, unspecified stage: Secondary | ICD-10-CM | POA: Diagnosis present

## 2016-04-22 DIAGNOSIS — G8250 Quadriplegia, unspecified: Secondary | ICD-10-CM | POA: Diagnosis present

## 2016-04-22 DIAGNOSIS — D508 Other iron deficiency anemias: Secondary | ICD-10-CM | POA: Diagnosis not present

## 2016-04-22 DIAGNOSIS — D696 Thrombocytopenia, unspecified: Secondary | ICD-10-CM | POA: Diagnosis not present

## 2016-04-22 DIAGNOSIS — G40309 Generalized idiopathic epilepsy and epileptic syndromes, not intractable, without status epilepticus: Secondary | ICD-10-CM | POA: Diagnosis present

## 2016-04-22 LAB — BASIC METABOLIC PANEL
Anion gap: 7 (ref 5–15)
BUN: 19 mg/dL (ref 6–20)
CHLORIDE: 104 mmol/L (ref 101–111)
CO2: 31 mmol/L (ref 22–32)
CREATININE: 0.93 mg/dL (ref 0.61–1.24)
Calcium: 9.6 mg/dL (ref 8.9–10.3)
GFR calc Af Amer: >60 mL/min (ref >60)
GFR calc non Af Amer: >60 mL/min (ref >60)
Glucose, Bld: 80 mg/dL (ref 65–99)
Potassium: 3.7 mmol/L (ref 3.5–5.1)
SODIUM: 142 mmol/L (ref 135–145)

## 2016-04-22 LAB — CBC WITH DIFFERENTIAL/PLATELET
Basophils Absolute: 0 10*3/uL (ref 0.0–0.1)
Basophils Relative: 0 %
EOS ABS: 0 10*3/uL (ref 0.0–0.7)
EOS PCT: 1 %
HCT: 29.8 % — ABNORMAL LOW (ref 39.0–52.0)
HEMOGLOBIN: 9.4 g/dL — AB (ref 13.0–17.0)
LYMPHS ABS: 0.5 10*3/uL — AB (ref 0.7–4.0)
Lymphocytes Relative: 11 %
MCH: 28.7 pg (ref 26.0–34.0)
MCHC: 31.5 g/dL (ref 30.0–36.0)
MCV: 91.1 fL (ref 78.0–100.0)
MONOS PCT: 6 %
Monocytes Absolute: 0.3 10*3/uL (ref 0.1–1.0)
Neutro Abs: 3.7 10*3/uL (ref 1.7–7.7)
Neutrophils Relative %: 82 %
PLATELETS: 143 10*3/uL — AB (ref 150–400)
RBC: 3.27 MIL/uL — ABNORMAL LOW (ref 4.22–5.81)
RDW: 15.8 % — ABNORMAL HIGH (ref 11.5–15.5)
WBC: 4.6 10*3/uL (ref 4.0–10.5)

## 2016-04-22 LAB — I-STAT CG4 LACTIC ACID, ED
LACTIC ACID, VENOUS: 1.24 mmol/L (ref 0.5–1.9)
Lactic Acid, Venous: 1.18 mmol/L (ref 0.5–1.9)

## 2016-04-22 MED ORDER — DEXTROSE 5 % IV SOLN
1.0000 g | Freq: Once | INTRAVENOUS | Status: AC
  Administered 2016-04-22: 1 g via INTRAVENOUS
  Filled 2016-04-22: qty 10

## 2016-04-22 MED ORDER — ALBUTEROL SULFATE (2.5 MG/3ML) 0.083% IN NEBU
5.0000 mg | INHALATION_SOLUTION | Freq: Once | RESPIRATORY_TRACT | Status: AC
  Administered 2016-04-22: 5 mg via RESPIRATORY_TRACT
  Filled 2016-04-22: qty 6

## 2016-04-22 MED ORDER — SODIUM CHLORIDE 0.9 % IV BOLUS (SEPSIS)
1000.0000 mL | Freq: Once | INTRAVENOUS | Status: AC
  Administered 2016-04-22: 1000 mL via INTRAVENOUS

## 2016-04-22 MED ORDER — DEXTROSE 5 % IV SOLN
500.0000 mg | Freq: Once | INTRAVENOUS | Status: AC
  Administered 2016-04-23: 500 mg via INTRAVENOUS
  Filled 2016-04-22: qty 500

## 2016-04-22 NOTE — ED Triage Notes (Addendum)
Pt from home with a representative from his group home. Group home representative stateas that his doctor said there was "something in his lung" they are unsure if pt has aspirated something or if pt has pneumonia. Per representative, pt as been coughing x 1 week producing yellow sputum. Pt is interactive at time of assessment and cracked a joke. Per staff, pt is more fatigued than normal. Per this nurse, pt has clear, but decreased lung sounds  Group home representative also would like pt's head looked at. Pt fell while trying to come here today. Pt has a small abrasion to his forehead. Pt does not take bloodthinners, does not report pain, and did not experience LOC.

## 2016-04-22 NOTE — ED Provider Notes (Signed)
[facility] DEPT Provider Note   CSN: [id number] Arrival date & time: 04/22/16  1943     History   Chief Complaint Chief Complaint  Patient presents with  . Shortness of Breath    sent from PCP  . Cough  . Fall    HPI [name] is a 50 y.o. male With a past medical history significant for bipolar disorder, MR, seizures, prior close head injury, and depression who presents from his primary care physician's office for productive cough, altered mental status, fatigue, and fall. Patient is accompanied by caregiver who reports that patient has a call for the last week. Patient has had a productive cough with yellow sputum. She reports that he occasionally gets gagged on this sputum. She reports that he has been acting more fatigued and tired. She thinks he is still eating okay but she says he is acting slightly differently. She says he is "not quite himself". Patient was seen by his PCP today who obtained an x-ray showing concern for pneumonia. Given patients appearance, the patient was sent to the ED for evaluation and likely admission. Caregiver reports that while being transferred to come here, patient fell hitting his head on the ground. She denied loss of consciousness, nausea, vomiting, or any other new symptoms. Patient has a small abrasion on his four head. Patient is denying headache or neck pain. Patient and caregiver deny any black or dark tarry stools. No rectal bleeding.    The history is provided by the patient and a caregiver. The history is limited by the condition of the patient.  Cough  This is a new problem. The current episode started more than 1 week ago. The problem occurs constantly. The problem has been gradually worsening. The cough is productive of sputum. There has been no fever. Associated symptoms include chills. Pertinent negatives include no chest pain, no headaches, no rhinorrhea, no shortness of breath and no wheezing. He has tried nothing for the symptoms.  The treatment provided no relief.      Past Medical History:  Diagnosis Date  . Anxiety   . Bipolar disorder ([place])   . BPH (benign prostatic hyperplasia)   . Closed head injury   . Constipation   . Depression   . Esophageal ulcer   . Malnutrition ([place])   . 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 Peptic ulcer disease   . Seizures ([place])   . Smoker     Patient Active Problem List   Diagnosis Date Noted  . Symptomatic anemia 02/22/2015  . Closed head injury 02/22/2015  . 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 Anxiety state 02/22/2015  . 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 Depression 03/28/2007  . 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 ESOPHAGOGASTRODUODENOSCOPY  06/17/2011   Procedure: ESOPHAGOGASTRODUODENOSCOPY (EGD);  Surgeon: Wonda Horner, MD;  Location: [facility];  Service: Endoscopy;  Laterality: N/A;  . GASTRECTOMY         Home Medications    Prior to Admission medications   Medication Sig Start Date End Date Taking? Authorizing Provider  carbamazepine (CARBATROL) 300 MG 12 hr capsule TAKE 1 CAPSULE BY MOUTH 2 TIMES DAILY 08/27/15   Kathrynn Ducking, MD  clonazePAM (KLONOPIN) 0.5 MG tablet Take 0.5 mg by mouth daily.     Historical Provider, MD  escitalopram (LEXAPRO) 10 MG tablet Take  10 mg by mouth daily.    Historical Provider, MD  LamoTRIgine XR 200 MG TB24 TAKE 2 TABLETS BY MOUTH AT BEDTIME 11/19/15   Kathrynn Ducking, MD  pantoprazole (PROTONIX) 40 MG tablet Take 40 mg by mouth daily.    Historical Provider, MD  polyethylene glycol (MIRALAX / GLYCOLAX) packet Take 17 g by mouth daily.    Historical Provider, MD  risperiDONE (RISPERDAL) 2 MG tablet Take 1 mg by mouth 2 (two) times daily.     Historical Provider, MD  Tamsulosin HCl (FLOMAX) 0.4 MG CAPS Take 0.4 mg by mouth at bedtime.     Historical  Provider, MD  traZODone (DESYREL) 50 MG tablet Take 50 mg by mouth at bedtime.    Historical Provider, MD  zolpidem (AMBIEN) 10 MG tablet Take 10 mg by mouth at bedtime.    Historical Provider, MD    Family History No family history on file.  Social History Social History  Substance Use Topics  . Smoking status: Former Smoker    Packs/day: 0.50    Quit date: 05/26/2010  . Smokeless tobacco: Former Systems developer    Quit date: 06/08/2001  . Alcohol use No     Allergies   Pineapple   Review of Systems Review of Systems  Constitutional: Positive for chills. Negative for activity change, diaphoresis, fatigue and fever.  HENT: Negative for congestion and rhinorrhea.   Eyes: Negative for visual disturbance.  Respiratory: Positive for cough. Negative for chest tightness, shortness of breath, wheezing and stridor.   Cardiovascular: Negative for chest pain, palpitations and leg swelling.  Gastrointestinal: Negative for abdominal distention, abdominal pain, blood in stool, constipation, diarrhea, nausea and vomiting.  Genitourinary: Negative for difficulty urinating, dysuria and flank pain.  Musculoskeletal: Negative for back pain and gait problem.  Skin: Negative for rash and wound.  Neurological: Negative for dizziness, weakness, light-headedness and headaches.  Psychiatric/Behavioral: Negative for agitation, behavioral problems and confusion.  All other systems reviewed and are negative.    Physical Exam Updated Vital Signs BP 145/75 (BP Location: Left Arm)   Pulse 65   Resp 15   SpO2 94%   Physical Exam  Constitutional: He appears well-developed and well-nourished.  HENT:  Head: Head is with abrasion and with contusion.    Mouth/Throat: Oropharynx is clear and moist. No oropharyngeal exudate.  Abrasion on four  Eyes: Conjunctivae are normal. Pupils are equal, round, and reactive to light. Right pupil is round. Left pupil is round. Pupils are equal.  Patient has abnormal eye  movement which is at baseline according to caregiver.  Neck: Neck supple.  Cardiovascular: Normal rate and regular rhythm.   No murmur heard. Pulmonary/Chest: No respiratory distress. He has no wheezes. He has rhonchi. He exhibits no tenderness.  Abdominal: Soft. There is no tenderness.  Musculoskeletal: He exhibits no edema.  Neurological: He is alert. He is not disoriented. No cranial nerve deficit or sensory deficit. He exhibits abnormal muscle tone (Baseline weakness). GCS eye subscore is 4. GCS verbal subscore is 5. GCS motor subscore is 6.  Skin: Skin is warm and dry.  Psychiatric: He has a normal mood and affect.  Nursing note and vitals reviewed.    ED Treatments / Results  Labs (all labs ordered are listed, but only abnormal results are displayed) Labs Reviewed  CBC WITH DIFFERENTIAL/PLATELET - Abnormal; Notable for the following:       Result Value   RBC 3.27 (*)    Hemoglobin 9.4 (*)  HCT 29.8 (*)    RDW 15.8 (*)    Platelets 143 (*)    Lymphs Abs 0.5 (*)    All other components within normal limits  CBC WITH DIFFERENTIAL/PLATELET - Abnormal; Notable for the following:    RBC 2.68 (*)    Hemoglobin 7.6 (*)    HCT 24.8 (*)    RDW 16.1 (*)    Platelets 133 (*)    Lymphs Abs 0.6 (*)    All other components within normal limits  COMPREHENSIVE METABOLIC PANEL - Abnormal; Notable for the following:    Chloride 112 (*)    Glucose, Bld 52 (*)    Calcium 8.3 (*)    Total Protein 6.1 (*)    Albumin 2.7 (*)    Alkaline Phosphatase 130 (*)    All other components within normal limits  GLUCOSE, CAPILLARY - Abnormal; Notable for the following:    Glucose-Capillary 64 (*)    All other components within normal limits  MRSA PCR SCREENING  CULTURE, BLOOD (ROUTINE X 2)  CULTURE, BLOOD (ROUTINE X 2)  CULTURE, EXPECTORATED SPUTUM-ASSESSMENT  GRAM STAIN  BASIC METABOLIC PANEL  LACTIC ACID, PLASMA  TSH  CORTISOL  STREP PNEUMONIAE URINARY ANTIGEN  PROCALCITONIN    INFLUENZA PANEL BY PCR (TYPE A & B, H1N1)  TROPONIN I  CARBAMAZEPINE LEVEL, TOTAL  HIV ANTIBODY (ROUTINE TESTING)  LEGIONELLA PNEUMOPHILA SEROGP 1 UR AG  I-STAT CG4 LACTIC ACID, ED  I-STAT CG4 LACTIC ACID, ED  TYPE AND SCREEN    EKG  EKG Interpretation None       Radiology Dg Chest 2 View  Result Date: 04/22/2016 CLINICAL DATA:  Cough productive of yellow sputum EXAM: CHEST  2 VIEW COMPARISON:  09/11/2014 CXR, CT abdomen the lower thorax and lung bases from 12/10/2010 FINDINGS: Patchy airspace opacities are noted at the right lung base suspicious for pneumonia possibly related to aspiration. Small right effusion obscuring the right costophrenic angle and right hemidiaphragm. Minimal atelectasis at the left lung base. There is mild pulmonary vascular congestion. The heart is borderline enlarged. The patient's chin obscures the apices. Sclerotic foci involving the left seventh and eighth ribs consistent with bony callus based on prior CT. Subtle old fracture deformity also noted of the left seventh and eighth ribs. IMPRESSION: 1. Patchy airspace opacities at the right lung base suspicious for pneumonia possibly related to aspiration pneumonia. 2. Small right effusion. 3. Mild vascular congestion. 4. Left seventh and eighth rib sclerotic densities likely reflect bony callus from prior old trauma. Electronically Signed   By: Ashley Royalty M.D.   On: 04/22/2016 20:38    Procedures Procedures (including critical care time)  Medications Ordered in ED Medications  albuterol (PROVENTIL) (2.5 MG/3ML) 0.083% nebulizer solution 5 mg (5 mg Nebulization Given 04/22/16 2131)     Initial Impression / Assessment and Plan / ED Course  I have reviewed the triage vital signs and the nursing notes.  Pertinent labs & imaging results that were available during my care of the patient were reviewed by me and considered in my medical decision making (see chart for details).  Clinical Course     ARAEL KOLTERMAN is a 50 y.o. male With a past medical history significant for bipolar disorder, MR, seizures, prior close head injury, and depression who presents from his primary care physician's office for productive cough, altered mental status, fatigue, and fall.  History and exam are seen above.  Patient's lungs were course in the right lower lobe.  Patient had no abdominal tenderness. Patient had abrasion on four had from fall. No evidence of laceration requiring stitches. Neck was nontender. Back was nontender. Patient moved all extremities however, patient does have baseline weakness according to caregiver. No reported difference in patients extraocular exam all the patient does have baseline non-congruent eye direction.  X-ray showed concern for pneumonia. Patient had an Anemia compared to several months ago. This will need to be trended.   CT head obtained for fall showing no evidence of acute traumatic injury.  Given caregiver report that patient is not quite acting right and evidence of pneumonia, patient will be treated for pneumonia and admitted to hospitalist service. Suspect patient will need training of troponin. Patient will be given fluids for his pneumonia. On reassessment, patient's pressures are softer in the low 100's and upper 90s. Decision made to patient. Patient will be admitted to hospitalist service for further management.    Final Clinical Impressions(s) / ED Diagnoses   Final diagnoses:  Community acquired pneumonia of right lower lobe of lung ([place])  Altered mental status, unspecified altered mental status type  Cough    New Prescriptions New Prescriptions   No medications on file    Clinical Impression: 1. Community acquired pneumonia of right lower lobe of lung ([place])   2. Altered mental status, unspecified altered mental status type   3. Cough     Disposition: Admit to hospitalist service    Courtney Paris, MD 04/23/16 1200

## 2016-04-23 ENCOUNTER — Encounter (HOSPITAL_COMMUNITY): Payer: Self-pay | Admitting: Internal Medicine

## 2016-04-23 DIAGNOSIS — J181 Lobar pneumonia, unspecified organism: Secondary | ICD-10-CM

## 2016-04-23 DIAGNOSIS — L8990 Pressure ulcer of unspecified site, unspecified stage: Secondary | ICD-10-CM | POA: Diagnosis present

## 2016-04-23 DIAGNOSIS — A419 Sepsis, unspecified organism: Principal | ICD-10-CM

## 2016-04-23 LAB — CBC WITH DIFFERENTIAL/PLATELET
BASOS PCT: 0 %
Basophils Absolute: 0 10*3/uL (ref 0.0–0.1)
EOS PCT: 1 %
Eosinophils Absolute: 0 10*3/uL (ref 0.0–0.7)
HEMATOCRIT: 24.8 % — AB (ref 39.0–52.0)
Hemoglobin: 7.6 g/dL — ABNORMAL LOW (ref 13.0–17.0)
Lymphocytes Relative: 13 %
Lymphs Abs: 0.6 10*3/uL — ABNORMAL LOW (ref 0.7–4.0)
MCH: 28.4 pg (ref 26.0–34.0)
MCHC: 30.6 g/dL (ref 30.0–36.0)
MCV: 92.5 fL (ref 78.0–100.0)
MONO ABS: 0.4 10*3/uL (ref 0.1–1.0)
MONOS PCT: 8 %
NEUTROS ABS: 3.6 10*3/uL (ref 1.7–7.7)
Neutrophils Relative %: 78 %
PLATELETS: 133 10*3/uL — AB (ref 150–400)
RBC: 2.68 MIL/uL — ABNORMAL LOW (ref 4.22–5.81)
RDW: 16.1 % — AB (ref 11.5–15.5)
WBC: 4.6 10*3/uL (ref 4.0–10.5)

## 2016-04-23 LAB — TYPE AND SCREEN
ABO/RH(D): O POS
Antibody Screen: NEGATIVE

## 2016-04-23 LAB — GLUCOSE, CAPILLARY
Glucose-Capillary: 64 mg/dL — ABNORMAL LOW (ref 65–99)
Glucose-Capillary: 68 mg/dL (ref 65–99)
Glucose-Capillary: 69 mg/dL (ref 65–99)

## 2016-04-23 LAB — INFLUENZA PANEL BY PCR (TYPE A & B)
INFLAPCR: NEGATIVE
Influenza B By PCR: NEGATIVE

## 2016-04-23 LAB — COMPREHENSIVE METABOLIC PANEL
ALK PHOS: 130 U/L — AB (ref 38–126)
ALT: 46 U/L (ref 17–63)
ANION GAP: 6 (ref 5–15)
AST: 33 U/L (ref 15–41)
Albumin: 2.7 g/dL — ABNORMAL LOW (ref 3.5–5.0)
BUN: 15 mg/dL (ref 6–20)
CALCIUM: 8.3 mg/dL — AB (ref 8.9–10.3)
CHLORIDE: 112 mmol/L — AB (ref 101–111)
CO2: 26 mmol/L (ref 22–32)
CREATININE: 0.8 mg/dL (ref 0.61–1.24)
Glucose, Bld: 52 mg/dL — ABNORMAL LOW (ref 65–99)
Potassium: 3.7 mmol/L (ref 3.5–5.1)
Sodium: 144 mmol/L (ref 135–145)
Total Bilirubin: 0.3 mg/dL (ref 0.3–1.2)
Total Protein: 6.1 g/dL — ABNORMAL LOW (ref 6.5–8.1)

## 2016-04-23 LAB — IRON AND TIBC
IRON: 21 ug/dL — AB (ref 45–182)
SATURATION RATIOS: 8 % — AB (ref 17.9–39.5)
TIBC: 277 ug/dL (ref 250–450)
UIBC: 256 ug/dL

## 2016-04-23 LAB — FERRITIN: Ferritin: 33 ng/mL (ref 24–336)

## 2016-04-23 LAB — STREP PNEUMONIAE URINARY ANTIGEN: Strep Pneumo Urinary Antigen: NEGATIVE

## 2016-04-23 LAB — CORTISOL: CORTISOL PLASMA: 20.1 ug/dL

## 2016-04-23 LAB — CARBAMAZEPINE LEVEL, TOTAL: Carbamazepine Lvl: 9 ug/mL (ref 4.0–12.0)

## 2016-04-23 LAB — PROCALCITONIN

## 2016-04-23 LAB — MRSA PCR SCREENING: MRSA by PCR: NEGATIVE

## 2016-04-23 LAB — TSH: TSH: 2.387 u[IU]/mL (ref 0.350–4.500)

## 2016-04-23 LAB — LACTIC ACID, PLASMA: Lactic Acid, Venous: 1.4 mmol/L (ref 0.5–1.9)

## 2016-04-23 LAB — VITAMIN B12: Vitamin B-12: 716 pg/mL (ref 180–914)

## 2016-04-23 LAB — TROPONIN I: Troponin I: <0.03 ng/mL (ref <0.03)

## 2016-04-23 MED ORDER — SODIUM CHLORIDE 0.9 % IV BOLUS (SEPSIS)
1000.0000 mL | Freq: Once | INTRAVENOUS | Status: AC
  Administered 2016-04-23: 1000 mL via INTRAVENOUS

## 2016-04-23 MED ORDER — ACETAMINOPHEN 325 MG PO TABS: 650.0000 mg | ORAL_TABLET | Freq: Four times a day (QID) | ORAL | Status: DC | PRN

## 2016-04-23 MED ORDER — BENZTROPINE MESYLATE 0.5 MG PO TABS
0.5000 mg | ORAL_TABLET | Freq: Every day | ORAL | Status: DC
  Administered 2016-04-23 – 2016-04-25 (×3): 0.5 mg via ORAL
  Filled 2016-04-23 (×3): qty 1

## 2016-04-23 MED ORDER — ONDANSETRON HCL 4 MG PO TABS: 4.0000 mg | ORAL_TABLET | Freq: Four times a day (QID) | ORAL | Status: DC | PRN

## 2016-04-23 MED ORDER — DEXTROSE 5 % IV SOLN
1.0000 g | INTRAVENOUS | Status: DC
  Administered 2016-04-23 – 2016-04-24 (×2): 1 g via INTRAVENOUS
  Filled 2016-04-23 (×4): qty 10

## 2016-04-23 MED ORDER — RESOURCE THICKENUP CLEAR PO POWD
ORAL | Status: DC | PRN
  Filled 2016-04-23: qty 125

## 2016-04-23 MED ORDER — SODIUM CHLORIDE 0.9 % IV SOLN
INTRAVENOUS | Status: DC
  Administered 2016-04-24 – 2016-04-25 (×3): via INTRAVENOUS
  Administered 2016-04-25: 150 mL via INTRAVENOUS

## 2016-04-23 MED ORDER — PANTOPRAZOLE SODIUM 40 MG PO TBEC
40.0000 mg | DELAYED_RELEASE_TABLET | Freq: Every day | ORAL | Status: DC
Start: 2016-04-23 — End: 2016-04-25
  Administered 2016-04-23 – 2016-04-25 (×3): 40 mg via ORAL
  Filled 2016-04-23 (×3): qty 1

## 2016-04-23 MED ORDER — SODIUM CHLORIDE 0.9 % IV SOLN
INTRAVENOUS | Status: DC
  Administered 2016-04-23: 02:00:00 via INTRAVENOUS

## 2016-04-23 MED ORDER — DEXTROSE 5 % IV SOLN
500.0000 mg | INTRAVENOUS | Status: DC
  Administered 2016-04-23 – 2016-04-24 (×2): 500 mg via INTRAVENOUS
  Filled 2016-04-23 (×3): qty 500

## 2016-04-23 MED ORDER — TRAZODONE HCL 50 MG PO TABS
50.0000 mg | ORAL_TABLET | Freq: Every day | ORAL | Status: DC
  Administered 2016-04-23 – 2016-04-24 (×2): 50 mg via ORAL
  Filled 2016-04-23 (×3): qty 1

## 2016-04-23 MED ORDER — ACETAMINOPHEN 650 MG RE SUPP: 650.0000 mg | Freq: Four times a day (QID) | RECTAL | Status: DC | PRN

## 2016-04-23 MED ORDER — CLONAZEPAM 0.5 MG PO TABS
0.5000 mg | ORAL_TABLET | Freq: Every day | ORAL | Status: DC
  Administered 2016-04-23 – 2016-04-24 (×3): 0.5 mg via ORAL
  Filled 2016-04-23 (×3): qty 1

## 2016-04-23 MED ORDER — ORAL CARE MOUTH RINSE
15.0000 mL | Freq: Two times a day (BID) | OROMUCOSAL | Status: DC
  Administered 2016-04-23 – 2016-04-25 (×6): 15 mL via OROMUCOSAL

## 2016-04-23 MED ORDER — ESCITALOPRAM OXALATE 10 MG PO TABS
10.0000 mg | ORAL_TABLET | Freq: Every day | ORAL | Status: DC
Start: 2016-04-23 — End: 2016-04-25
  Administered 2016-04-23 – 2016-04-25 (×3): 10 mg via ORAL
  Filled 2016-04-23 (×3): qty 1

## 2016-04-23 MED ORDER — CARBAMAZEPINE ER 300 MG PO CP12
300.0000 mg | ORAL_CAPSULE | Freq: Two times a day (BID) | ORAL | Status: DC
  Administered 2016-04-23 – 2016-04-25 (×6): 300 mg via ORAL
  Filled 2016-04-23 (×6): qty 1

## 2016-04-23 MED ORDER — SODIUM CHLORIDE 0.9 % IV BOLUS (SEPSIS)
500.0000 mL | Freq: Once | INTRAVENOUS | Status: AC
  Administered 2016-04-23: 500 mL via INTRAVENOUS

## 2016-04-23 MED ORDER — ONDANSETRON HCL 4 MG/2ML IJ SOLN: 4.0000 mg | Freq: Four times a day (QID) | INTRAMUSCULAR | Status: DC | PRN

## 2016-04-23 MED ORDER — LORATADINE 10 MG PO TABS
10.0000 mg | ORAL_TABLET | Freq: Every day | ORAL | Status: DC
  Administered 2016-04-23 – 2016-04-25 (×3): 10 mg via ORAL
  Filled 2016-04-23 (×3): qty 1

## 2016-04-23 MED ORDER — RISPERIDONE 1 MG PO TABS
1.0000 mg | ORAL_TABLET | Freq: Two times a day (BID) | ORAL | Status: DC
  Administered 2016-04-23 – 2016-04-25 (×5): 1 mg via ORAL
  Filled 2016-04-23 (×6): qty 1

## 2016-04-23 MED ORDER — LAMOTRIGINE 100 MG PO TABS
100.0000 mg | ORAL_TABLET | Freq: Two times a day (BID) | ORAL | Status: DC
  Administered 2016-04-23 – 2016-04-25 (×6): 100 mg via ORAL
  Filled 2016-04-23 (×6): qty 1

## 2016-04-23 NOTE — Progress Notes (Signed)
Patient's mom said it is ok to give out information to [facility]. Contact information is in the contact list.

## 2016-04-23 NOTE — Progress Notes (Signed)
[name] updated regarding pt 0400 BP. Pt alert. Will continue to monitor.

## 2016-04-23 NOTE — Clinical Social Work Note (Signed)
Clinical Social Work Assessment  Patient Details  Name: [name] MRN: [id number] Date of Birth: 1966-03-30  Date of referral:  04/23/16               Reason for consult:  Discharge Planning                Permission sought to share information with:  Facility Art therapist granted to share information::  Yes, Verbal Permission Granted  Name::        Agency::     Relationship::     Contact Information:     Housing/Transportation Living arrangements for the past 2 months:  Group Home Source of Information:   (group home) Patient Interpreter Needed:  None Criminal Activity/Legal Involvement Pertinent to Current Situation/Hospitalization:  No - Comment as needed Significant Relationships:  Parents, Siblings Lives with:    Do you feel safe going back to the place where you live?    Need for family participation in patient care:  Yes (Comment)  Care giving concerns:  Pt needs to be at baseline to return to group home at d/c.   Social Worker assessment / plan:  Pt hospitalized on 04/22/16 from [facility] group home where he has been a resident for several years. Pt has multiple medical concerns including sepsis, hx of MR, closed brain injury, bipolar D/O, seizures. Pt's mother spoke with nsg this am and she gave care team permission to speak with group home staff. CSW spoke with [name] [phone] this am. [name] reports that pt requires assistance with all ADL's. Pt uses a w/c and can assist with transfers. Pt is incontinent. [name] is able to accept pt back to group home, when stable, if at baseline. FL2 will be needed at d/c. CSW will continue to follow to assist with d/c planning needs.   Employment status:  Disabled (Comment on whether or not currently receiving Disability) Insurance information:  Medicare, Medicaid In [place] PT Recommendations:  Not assessed at this time Information / Referral to community resources:     Patient/Family's Response to care:   Spoke with [name] from group home. Pt can return to group home if he is at baseline when stable for d/c.  Patient/Family's Understanding of and Emotional Response to Diagnosis, Current Treatment, and Prognosis:  Pt has MR, oriented x1 and is unaware of his medical issues. CSW has not been able to connect with pt's mother.  Emotional Assessment Appearance:  Appears stated age Attitude/Demeanor/Rapport:  Unable to Assess Affect (typically observed):  Unable to Assess Orientation:  Oriented to Self Alcohol / Substance use:  Not Applicable Psych involvement (Current and /or in the community):  No (Comment)  Discharge Needs  Concerns to be addressed:  Discharge Planning Concerns Readmission within the last 30 days:  No Current discharge risk:    Barriers to Discharge:  No Barriers Identified   [name], [place] 04/23/2016, 1:05 PM

## 2016-04-23 NOTE — Evaluation (Signed)
Clinical/Bedside Swallow Evaluation Patient Details  Name: [name] MRN: [id number] Date of Birth: 02/18/1966  Today's Date: 04/23/2016 Time: SLP Start Time (ACUTE ONLY): 14 SLP Stop Time (ACUTE ONLY): 1300 SLP Time Calculation (min) (ACUTE ONLY): 20 min  Past Medical History:  Past Medical History:  Diagnosis Date  . Anxiety   . Bipolar disorder ([place])   . BPH (benign prostatic hyperplasia)   . Closed head injury   . Constipation   . Depression   . Esophageal ulcer   . Malnutrition ([place])   . 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 Peptic ulcer disease   . Seizures ([place])   . Smoker    Past Surgical History:  Past Surgical History:  Procedure Laterality Date  . ESOPHAGOGASTRODUODENOSCOPY  06/17/2011   Procedure: ESOPHAGOGASTRODUODENOSCOPY (EGD);  Surgeon: Wonda Horner, MD;  Location: [facility];  Service: Endoscopy;  Laterality: N/A;  . GASTRECTOMY     HPI:  [name] is a 50 y.o. male with history of traumatic brain injury with cognition difficulty who is presently at group home was found to have increasing weakness and productive cough over the last week and a half. As per the caregiver patient did not have any nausea vomiting diarrhea chest pain. While taking the patient to patient's PCP patient had a fall at his living facility. Did not lose consciousness. Patient's PCP transferred patient to ER. In the ER patient was found to be hypothermic with chest x-ray showing infiltrates. Lab works also showed leukocytosis with worsening anemia. Patient is being admitted for sepsis probably from pneumonia.  Was seen by SLP in 2013, recommended dys 1/nectar, though reportedly he was eating regular textures impulsively at his group home.    Assessment / Plan / Recommendation Clinical Impression  Pt presents with a baseline, moderate oropharyngeal dysphagia with long term requirement of nectar thick  liquids which he has tolerated for years. Pts caregiver is present and reprors that he typically drinks nectar thick liquids with a straw to aid in control of bolus size (he gulps cup sips) and his caregivers cut up his foods and provide full supervision with meals due to impulsive intake. Upon SLP observation today pt is alert and cheerful, respiration WNL. He tolerates nectar thick liquids and solids with a mild observable delay in swallow initiation, but no signs of aspiration. Suspect dysphagia is at baseline. Recommend pt initiate a dys 3 (mech soft) diet with nectar thick liquids and full supervision. Signs placed on door and above bed to leave tray at nurses station. No SLP f/u needed will sign off.     Aspiration Risk  Moderate aspiration risk    Diet Recommendation Dysphagia 3 (Mech soft);Nectar-thick liquid   Liquid Administration via: Straw Medication Administration: Whole meds with liquid Supervision: Full supervision/cueing for compensatory strategies Compensations: Slow rate;Small sips/bites;Minimize environmental distractions Postural Changes: Seated upright at 90 degrees    Other  Recommendations Oral Care Recommendations: Oral care BID Other Recommendations: Order thickener from pharmacy   Follow up Recommendations        Frequency and Duration            Prognosis        Swallow Study   General HPI: [name] is a 50 y.o. male with history of traumatic brain injury with cognition difficulty who is presently at group home was found to have increasing weakness and productive cough over the  last week and a half. As per the caregiver patient did not have any nausea vomiting diarrhea chest pain. While taking the patient to patient's PCP patient had a fall at his living facility. Did not lose consciousness. Patient's PCP transferred patient to ER. In the ER patient was found to be hypothermic with chest x-ray showing infiltrates. Lab works also showed leukocytosis with  worsening anemia. Patient is being admitted for sepsis probably from pneumonia.  Was seen by SLP in 2013, recommended dys 1/nectar, though reportedly he was eating regular textures impulsively at his group home.  Type of Study: Bedside Swallow Evaluation Previous Swallow Assessment: see HPI Diet Prior to this Study: NPO Temperature Spikes Noted:  (low temps) Respiratory Status: Nasal cannula History of Recent Intubation: No Behavior/Cognition: Alert;Cooperative;Pleasant mood Oral Cavity Assessment: Within Functional Limits Oral Care Completed by SLP: No Oral Cavity - Dentition: Adequate natural dentition Vision: Functional for self-feeding Self-Feeding Abilities: Needs assist Patient Positioning: Upright in bed Baseline Vocal Quality: Normal Volitional Cough: Strong Volitional Swallow: Able to elicit    Oral/Motor/Sensory Function Overall Oral Motor/Sensory Function: Within functional limits   Ice Chips     Thin Liquid Thin Liquid: Not tested    Nectar Thick Nectar Thick Liquid: Impaired Presentation: Cup;Straw Pharyngeal Phase Impairments: Suspected delayed Swallow   Honey Thick Honey Thick Liquid: Not tested   Puree Puree: Impaired Presentation: Spoon Pharyngeal Phase Impairments: Suspected delayed Swallow   Solid   GO   Solid: Impaired Oral Phase Functional Implications: Oral residue (mild residue) Pharyngeal Phase Impairments: Suspected delayed Swallow        Mckynzie Liwanag, Katherene Ponto 04/23/2016,1:00 PM

## 2016-04-23 NOTE — Progress Notes (Signed)
[name] paged regarding completion of 1L bolus and updated regarding pt BP, oxygen saturation and increased supplemental oxygen needs. 1L bolus ordered. Will continue to monitor.

## 2016-04-23 NOTE — Progress Notes (Addendum)
CSW consulted to assist with d/c planning. Pt is from a group home ( [facility]  ). CSW has not been able to confirm placement / dc plan with group home / mother but will continue to place calls.   Werner Lean LCSW [phone]

## 2016-04-23 NOTE — Progress Notes (Signed)
[name] updated regarding pt rectal temp of 91.59F and of soft BPs. Will continue to monitor.

## 2016-04-23 NOTE — Progress Notes (Signed)
0600. Pt oxygen saturation sustaining 89-91%. Pt resting and noted to be mouth breathing. Pt breathing 25-30 breaths/minute. RN placed NRB mask on pt. Pt oxygen saturation improved to 98%. Will continue to monitor.

## 2016-04-23 NOTE — Progress Notes (Signed)
PROGRESS NOTE                                                                                                                                                                                                             Patient Demographics:    [name], is a 50 y.o. male, DOB - 08/30/1965, [id number]  Admit date - 04/22/2016   Admitting Physician Rise Patience, MD  Outpatient Primary MD for the patient is ANDY,CAMILLE L, MD  LOS - 1  Outpatient Specialists:None  Chief Complaint  Patient presents with  . Shortness of Breath    sent from PCP  . Cough  . Fall       Brief Narrative  50 year old male with traumatic brain injury mental retardation, resident of group home sent with increased weakness and productive cough almost past 10 days. While being taken to his PCP had a fall at the facility without sustaining any injury or loss of consciousness. PCP send patient to the ED where he was found to be hypothermic with chest x-ray suggestive of pneumonia. Head CT was unremarkable. Lab work showed leukocytosis with worsening anemia. Patient admitted for sepsis (hypothermia, tachypnea and hypotension) likely secondary to vulvar pneumonia.   Subjective:   Patient poorly communicative, at baseline. Denies any symptoms.   Assessment  & Plan :    Principal Problem:   Sepsis ([place]) Secondary to right lobar pneumonia. Has small right effusion. Continue empiric Rocephin and azithromycin. Sepsis currently resolved. Was hypothermic on presentation, had normal cortisol and TSH. Flu PCR, strep pneumonia antigen negative. Follow cultures.  Active Problems: Hyperthermia Secondary to sepsis. Now resolved. TSH and cortisol level normal.  Anemia Chronic with previous hospitalization for symptomatic anemia with unclear source. Check stool for occult blood. Check iron panel, folate and B12. Had EGD in 2013 (unable to see  results).  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 Continue carbamazepine.  Thrombocytopenia possibly due to sepsis. Monitor closely.      Code Status : Full code  Family Communication  : None at bedside. Will update mother  Disposition Plan  : Return to group home once stable. Transfer to telemetry this afternoon if stable.  Barriers For Discharge : Active symptoms  Consults  :  None  Procedures  : Head CT  DVT  Prophylaxis  :  Lovenox -   Lab Results  Component Value Date   PLT 133 (L) 04/23/2016    Antibiotics  :    Anti-infectives    Start     Dose/Rate Route Frequency Ordered Stop   04/23/16 2200  cefTRIAXone (ROCEPHIN) 1 g in dextrose 5 % 50 mL IVPB     1 g 100 mL/hr over 30 Minutes Intravenous Every 24 hours 04/23/16 0052 04/30/16 2159   04/23/16 2200  azithromycin (ZITHROMAX) 500 mg in dextrose 5 % 250 mL IVPB     500 mg 250 mL/hr over 60 Minutes Intravenous Every 24 hours 04/23/16 0052 04/30/16 2159   04/22/16 2345  cefTRIAXone (ROCEPHIN) 1 g in dextrose 5 % 50 mL IVPB     1 g 100 mL/hr over 30 Minutes Intravenous  Once 04/22/16 2340 04/23/16 0011   04/22/16 2345  azithromycin (ZITHROMAX) 500 mg in dextrose 5 % 250 mL IVPB     500 mg 250 mL/hr over 60 Minutes Intravenous  Once 04/22/16 2340 04/23/16 0143        Objective:   Vitals:   04/23/16 0630 04/23/16 0645 04/23/16 0730 04/23/16 1200  BP: (!) 90/47 (!) 95/45 (!) 100/49 (!) 92/56  Pulse:   98 95  Resp:   15 14  Temp:   97.4 F (36.3 C) 97.3 F (36.3 C)  TempSrc:   Oral Axillary  SpO2:   94% 95%  Weight:      Height:        Wt Readings from Last 3 Encounters:  04/23/16 88.1 kg (194 lb 3.6 oz)  12/06/15 74.8 kg (165 lb)  09/03/15 74.8 kg (165 lb)     Intake/Output Summary (Last 24 hours) at 04/23/16 1246 Last data filed at 04/23/16 0700  Gross per 24 hour  Intake          4029.58 ml  Output              415 ml  Net           3614.58 ml     Physical Exam  Gen: not in distress HEENT:  moist mucosa, supple neck Chest: Diminished breath sounds over right lung CVS: N S1&S2, no murmurs, rubs or gallop GI: soft, NT, ND, BS+ Musculoskeletal: warm, no edema CNS: Alert and awake, poorly oriented.    Data Review:    CBC  Recent Labs Lab 04/22/16 2125 04/23/16 0354  WBC 4.6 4.6  HGB 9.4* 7.6*  HCT 29.8* 24.8*  PLT 143* 133*  MCV 91.1 92.5  MCH 28.7 28.4  MCHC 31.5 30.6  RDW 15.8* 16.1*  LYMPHSABS 0.5* 0.6*  MONOABS 0.3 0.4  EOSABS 0.0 0.0  BASOSABS 0.0 0.0    Chemistries   Recent Labs Lab 04/22/16 2125 04/23/16 0354  NA 142 144  K 3.7 3.7  CL 104 112*  CO2 31 26  GLUCOSE 80 52*  BUN 19 15  CREATININE 0.93 0.80  CALCIUM 9.6 8.3*  AST  --  33  ALT  --  46  ALKPHOS  --  130*  BILITOT  --  0.3   ------------------------------------------------------------------------------------------------------------------ No results for input(s): CHOL, HDL, LDLCALC, TRIG, CHOLHDL, LDLDIRECT in the last 72 hours.  No results found for: HGBA1C ------------------------------------------------------------------------------------------------------------------  Recent Labs  04/23/16 0050  TSH 2.387   ------------------------------------------------------------------------------------------------------------------ No results for input(s): VITAMINB12, FOLATE, FERRITIN, TIBC, IRON, RETICCTPCT in the last 72 hours.  Coagulation profile No results for input(s): INR, PROTIME in the  last 168 hours.  No results for input(s): DDIMER in the last 72 hours.  Cardiac Enzymes  Recent Labs Lab 04/23/16 0711  TROPONINI <0.03   ------------------------------------------------------------------------------------------------------------------ No results found for: BNP  Inpatient Medications  Scheduled Meds: . azithromycin  500 mg Intravenous Q24H  . benztropine  0.5 mg Oral Daily  . carbamazepine  300  mg Oral BID  . cefTRIAXone (ROCEPHIN)  IV  1 g Intravenous Q24H  . clonazePAM  0.5 mg Oral QHS  . escitalopram  10 mg Oral Daily  . lamoTRIgine  100 mg Oral BID  . loratadine  10 mg Oral Daily  . mouth rinse  15 mL Mouth Rinse BID  . pantoprazole  40 mg Oral Daily  . risperiDONE  1 mg Oral BID  . traZODone  50 mg Oral QHS   Continuous Infusions: . sodium chloride 150 mL/hr at 04/23/16 0600   PRN Meds:.acetaminophen **OR** acetaminophen, ondansetron **OR** ondansetron (ZOFRAN) IV  Micro Results Recent Results (from the past 240 hour(s))  MRSA PCR Screening     Status: None   Collection Time: 04/23/16  2:13 AM  Result Value Ref Range Status   MRSA by PCR NEGATIVE NEGATIVE Final    Comment:        The GeneXpert MRSA Assay (FDA approved for NASAL specimens only), is one component of a comprehensive MRSA colonization surveillance program. It is not intended to diagnose MRSA infection nor to guide or monitor treatment for MRSA infections.     Radiology Reports Dg Chest 2 View  Result Date: 04/22/2016 CLINICAL DATA:  Cough productive of yellow sputum EXAM: CHEST  2 VIEW COMPARISON:  09/11/2014 CXR, CT abdomen the lower thorax and lung bases from 12/10/2010 FINDINGS: Patchy airspace opacities are noted at the right lung base suspicious for pneumonia possibly related to aspiration. Small right effusion obscuring the right costophrenic angle and right hemidiaphragm. Minimal atelectasis at the left lung base. There is mild pulmonary vascular congestion. The heart is borderline enlarged. The patient's chin obscures the apices. Sclerotic foci involving the left seventh and eighth ribs consistent with bony callus based on prior CT. Subtle old fracture deformity also noted of the left seventh and eighth ribs. IMPRESSION: 1. Patchy airspace opacities at the right lung base suspicious for pneumonia possibly related to aspiration pneumonia. 2. Small right effusion. 3. Mild vascular congestion.  4. Left seventh and eighth rib sclerotic densities likely reflect bony callus from prior old trauma. Electronically Signed   By: Ashley Royalty M.D.   On: 04/22/2016 20:38   Ct Head Wo Contrast  Result Date: 04/22/2016 CLINICAL DATA:  Status post fall, with head injury. Initial encounter. EXAM: CT HEAD WITHOUT CONTRAST TECHNIQUE: Contiguous axial images were obtained from the base of the skull through the vertex without intravenous contrast. COMPARISON:  CT of the head performed 09/10/2015 FINDINGS: Brain: No evidence of acute infarction, hemorrhage, hydrocephalus, extra-axial collection or mass lesion/mass effect. Prominence of the ventricles and sulci reflects mild to moderate cortical volume loss. Chronic encephalomalacia is noted at the high left frontal lobe. Cerebellar atrophy is noted. The brainstem and fourth ventricle are within normal limits. The basal ganglia are unremarkable in appearance. No mass effect or midline shift is seen. Vascular: No hyperdense vessel or unexpected calcification. Skull: There is mild chronic deformity involving the nasal bone, with mild rightward displacement, stable from the prior study. There is no evidence of acute fracture. Postoperative change is noted at the frontal calvarium bilaterally. Sinuses/Orbits: The orbits are within  normal limits. The paranasal sinuses and mastoid air cells are well-aerated. Other: No significant soft tissue abnormalities are seen. IMPRESSION: 1. No acute intracranial pathology on CT. 2. Mild to moderate cortical volume loss noted. Chronic encephalomalacia at the high left frontal lobe. 3. Mild chronic deformity of the nasal bone, with mild rightward displacement, stable from the prior study. Electronically Signed   By: Garald Balding M.D.   On: 04/22/2016 23:21    Time Spent in minutes  20   Louellen Molder M.D on 04/23/2016 at 12:46 PM  Between 7am to 7pm - Pager - [phone]  After 7pm go to [email or web address] - password  [facility]  [facility] -  Office  [phone]

## 2016-04-23 NOTE — H&P (Addendum)
History and Physical    [name] [id number] DOB: 17-Sep-1965 DOA: 04/22/2016  PCP: [name], MD  Patient coming from: Group home.  History obtained from patient's caregiver, patient's sister, ER physician and previous records.  Chief Complaint: Cough and weakness.  HPI: [name] is a 50 y.o. male with history of traumatic brain injury with cognition difficulty who is presently at group home was found to have increasing weakness and productive cough over the last week and a half. As per the caregiver patient did not have any nausea vomiting diarrhea chest pain. While taking the patient to patient's PCP patient had a fall at his living facility. Did not lose consciousness. Patient's PCP transferred patient to ER. In the ER patient was found to be hypothermic with chest x-ray showing infiltrates. Lab works also showed leukocytosis with worsening anemia. Patient is being admitted for sepsis probably from pneumonia.   ED Course: Patient was started on empiric antibiotics for community-acquired pneumonia after blood cultures were sent. Patient was placed on warming blankets for hypothermia.  Review of Systems: As per HPI, rest all negative.   Past Medical History:  Diagnosis Date  . Anxiety   . Bipolar disorder ([place])   . BPH (benign prostatic hyperplasia)   . Closed head injury   . Constipation   . Depression   . Esophageal ulcer   . Malnutrition ([place])   . 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 Peptic ulcer disease   . Seizures ([place])   . Smoker     Past Surgical History:  Procedure Laterality Date  . ESOPHAGOGASTRODUODENOSCOPY  06/17/2011   Procedure: ESOPHAGOGASTRODUODENOSCOPY (EGD);  Surgeon: Wonda Horner, MD;  Location: [facility];  Service: Endoscopy;  Laterality: N/A;  . GASTRECTOMY       reports that he quit smoking about 5 years ago. He smoked 0.50 packs per day. He quit  smokeless tobacco use about 14 years ago. He reports that he does not drink alcohol or use drugs.  Allergies  Allergen Reactions  . Pineapple Anaphylaxis    Family History  Problem Relation Age of Onset  . Family history unknown: Yes    Prior to Admission medications   Medication Sig Start Date End Date Taking? Authorizing Provider  benztropine (COGENTIN) 0.5 MG tablet Take 0.5 mg by mouth daily.   Yes Historical Provider, MD  carbamazepine (CARBATROL) 300 MG 12 hr capsule TAKE 1 CAPSULE BY MOUTH 2 TIMES DAILY 08/27/15  Yes Kathrynn Ducking, MD  clonazePAM (KLONOPIN) 0.5 MG tablet Take 0.5 mg by mouth at bedtime. And 1 tablet as needed for anxiety   Yes Historical Provider, MD  escitalopram (LEXAPRO) 10 MG tablet Take 10 mg by mouth daily.   Yes Historical Provider, MD  LamoTRIgine XR 200 MG TB24 TAKE 2 TABLETS BY MOUTH AT BEDTIME 11/19/15  Yes Kathrynn Ducking, MD  loratadine (CLARITIN) 10 MG tablet Take 10 mg by mouth daily.   Yes Historical Provider, MD  pantoprazole (PROTONIX) 40 MG tablet Take 40 mg by mouth daily.   Yes Historical Provider, MD  risperiDONE (RISPERDAL) 2 MG tablet Take 1 mg by mouth 2 (two) times daily.    Yes Historical Provider, MD  Tamsulosin HCl (FLOMAX) 0.4 MG CAPS Take 0.4 mg by mouth at bedtime.    Yes Historical Provider, MD  traZODone (DESYREL) 50 MG tablet Take 50 mg by mouth at bedtime.  Yes Historical Provider, MD    Physical Exam: Vitals:   04/22/16 2131 04/22/16 2330 04/23/16 0028 04/23/16 0044  BP:  98/82  (!) 88/72  Pulse:  63  70  Resp:    18  TempSrc:   Rectal   SpO2: 94% 99%  92%      Constitutional: Moderately built and nourished. Vitals:   04/22/16 2131 04/22/16 2330 04/23/16 0028 04/23/16 0044  BP:  98/82  (!) 88/72  Pulse:  63  70  Resp:    18  TempSrc:   Rectal   SpO2: 94% 99%  92%   Eyes: Anicteric no pallor. ENMT: No discharge from the ears eyes nose and mouth. Neck: No neck rigidity. No mass felt. Respiratory: No rhonchi  or crepitations. Cardiovascular: S1-S2 heard. No murmurs appreciated. Abdomen: Soft nontender bowel sounds present. No guarding or rigidity. Musculoskeletal: No edema. No joint effusion. Skin: No rash. Skin appears warm. Neurologic: Alert awake oriented to his name follows commands and moves all extremities. Psychiatric: Patient has cognitive difficulties from closed head injury.   Labs on Admission: I have personally reviewed following labs and imaging studies  CBC:  Recent Labs Lab 04/22/16 2125  WBC 4.6  NEUTROABS 3.7  HGB 9.4*  HCT 29.8*  MCV 91.1  PLT [id number]*   Basic Metabolic Panel:  Recent Labs Lab 04/22/16 2125  NA 142  K 3.7  CL 104  CO2 31  GLUCOSE 80  BUN 19  CREATININE 0.93  CALCIUM 9.6   GFR: CrCl cannot be calculated (Unknown ideal weight.). Liver Function Tests: No results for input(s): AST, ALT, ALKPHOS, BILITOT, PROT, ALBUMIN in the last 168 hours. No results for input(s): LIPASE, AMYLASE in the last 168 hours. No results for input(s): AMMONIA in the last 168 hours. Coagulation Profile: No results for input(s): INR, PROTIME in the last 168 hours. Cardiac Enzymes: No results for input(s): CKTOTAL, CKMB, CKMBINDEX, TROPONINI in the last 168 hours. BNP (last 3 results) No results for input(s): PROBNP in the last 8760 hours. HbA1C: No results for input(s): HGBA1C in the last 72 hours. CBG: No results for input(s): GLUCAP in the last 168 hours. Lipid Profile: No results for input(s): CHOL, HDL, LDLCALC, TRIG, CHOLHDL, LDLDIRECT in the last 72 hours. Thyroid Function Tests: No results for input(s): TSH, T4TOTAL, FREET4, T3FREE, THYROIDAB in the last 72 hours. Anemia Panel: No results for input(s): VITAMINB12, FOLATE, FERRITIN, TIBC, IRON, RETICCTPCT in the last 72 hours. Urine analysis:    Component Value Date/Time   COLORURINE YELLOW 09/11/2014 1951   APPEARANCEUR CLEAR 09/11/2014 1951   LABSPEC 1.010 09/11/2014 1951   PHURINE 7.0 09/11/2014  1951   GLUCOSEU NEGATIVE 09/11/2014 1951   HGBUR NEGATIVE 09/11/2014 1951   HGBUR negative 07/31/2009 [place] 09/11/2014 1951   KETONESUR NEGATIVE 09/11/2014 1951   PROTEINUR NEGATIVE 09/11/2014 1951   UROBILINOGEN 0.2 09/11/2014 1951   NITRITE NEGATIVE 09/11/2014 1951   LEUKOCYTESUR NEGATIVE 09/11/2014 1951   Sepsis Labs: @LABRCNTIP (procalcitonin:4,lacticidven:4) )No results found for this or any previous visit (from the past 240 hour(s)).   Radiological Exams on Admission: Dg Chest 2 View  Result Date: 04/22/2016 CLINICAL DATA:  Cough productive of yellow sputum EXAM: CHEST  2 VIEW COMPARISON:  09/11/2014 CXR, CT abdomen the lower thorax and lung bases from 12/10/2010 FINDINGS: Patchy airspace opacities are noted at the right lung base suspicious for pneumonia possibly related to aspiration. Small right effusion obscuring the right costophrenic angle and right hemidiaphragm. Minimal atelectasis at  the left lung base. There is mild pulmonary vascular congestion. The heart is borderline enlarged. The patient's chin obscures the apices. Sclerotic foci involving the left seventh and eighth ribs consistent with bony callus based on prior CT. Subtle old fracture deformity also noted of the left seventh and eighth ribs. IMPRESSION: 1. Patchy airspace opacities at the right lung base suspicious for pneumonia possibly related to aspiration pneumonia. 2. Small right effusion. 3. Mild vascular congestion. 4. Left seventh and eighth rib sclerotic densities likely reflect bony callus from prior old trauma. Electronically Signed   By: Ashley Royalty M.D.   On: 04/22/2016 20:38   Ct Head Wo Contrast  Result Date: 04/22/2016 CLINICAL DATA:  Status post fall, with head injury. Initial encounter. EXAM: CT HEAD WITHOUT CONTRAST TECHNIQUE: Contiguous axial images were obtained from the base of the skull through the vertex without intravenous contrast. COMPARISON:  CT of the head performed  09/10/2015 FINDINGS: Brain: No evidence of acute infarction, hemorrhage, hydrocephalus, extra-axial collection or mass lesion/mass effect. Prominence of the ventricles and sulci reflects mild to moderate cortical volume loss. Chronic encephalomalacia is noted at the high left frontal lobe. Cerebellar atrophy is noted. The brainstem and fourth ventricle are within normal limits. The basal ganglia are unremarkable in appearance. No mass effect or midline shift is seen. Vascular: No hyperdense vessel or unexpected calcification. Skull: There is mild chronic deformity involving the nasal bone, with mild rightward displacement, stable from the prior study. There is no evidence of acute fracture. Postoperative change is noted at the frontal calvarium bilaterally. Sinuses/Orbits: The orbits are within normal limits. The paranasal sinuses and mastoid air cells are well-aerated. Other: No significant soft tissue abnormalities are seen. IMPRESSION: 1. No acute intracranial pathology on CT. 2. Mild to moderate cortical volume loss noted. Chronic encephalomalacia at the high left frontal lobe. 3. Mild chronic deformity of the nasal bone, with mild rightward displacement, stable from the prior study. Electronically Signed   By: Garald Balding M.D.   On: 04/22/2016 23:21    EKG: Independently reviewed. Normal sinus rhythm with RBBB.  Assessment/Plan Principal Problem:   Sepsis ([place]) Active Problems:   Depression   Generalized convulsive epilepsy ([place])   Symptomatic anemia   Mental retardation, mild (I.Q. 50-70)   Spastic quadriparesis ([place])   Community acquired pneumonia of right lower lobe of lung ([place])    1. Sepsis from pneumonia - for now patient is placed on ceftriaxone and Zithromax for community-acquired pneumonia. Will get swallow evaluation in a.m. for possible aspiration. Continue with aggressive hydration. Follow procalcitonin levels, lactate levels blood cultures influenza PCR urine strep and  Legionella antigen. Sputum cultures when available.  2. Hypothermia - probably from sepsis. Check TSH cortisol levels follow blood cultures. 3. Anemia - patient has chronic anemia and has had previously been admitted for symptomatic anemia and at that time source was unclear. We'll check stool for occult blood and follow CBC closely. 4. History of traumatic brain injury with cognitive difficulties and seizure - check carbamazepine levels. Continue present medications. 5. Thrombocytopenia - probably secondary to infectious cause. Follow CBC closely.   DVT prophylaxis: Lovenox. Code Status: Full code.  Family Communication: Patient's sister.  Disposition Plan: Group home.  Consults called: None.  Admission status: Inpatient.    Rise Patience MD [facility] Pager [phone].  If 7PM-7AM, please contact night-coverage [email or web address] Password [facility]  04/23/2016, 12:53 AM

## 2016-04-24 DIAGNOSIS — D696 Thrombocytopenia, unspecified: Secondary | ICD-10-CM

## 2016-04-24 DIAGNOSIS — D508 Other iron deficiency anemias: Secondary | ICD-10-CM

## 2016-04-24 LAB — CBC
HCT: 23.8 % — ABNORMAL LOW (ref 39.0–52.0)
Hemoglobin: 7.4 g/dL — ABNORMAL LOW (ref 13.0–17.0)
MCH: 28.2 pg (ref 26.0–34.0)
MCHC: 31.1 g/dL (ref 30.0–36.0)
MCV: 90.8 fL (ref 78.0–100.0)
PLATELETS: 101 10*3/uL — AB (ref 150–400)
RBC: 2.62 MIL/uL — ABNORMAL LOW (ref 4.22–5.81)
RDW: 16.6 % — AB (ref 11.5–15.5)
WBC: 3.7 10*3/uL — AB (ref 4.0–10.5)

## 2016-04-24 LAB — FOLATE RBC
FOLATE, RBC: 1980 ng/mL (ref >498)
Folate, Hemolysate: 443.5 ng/mL
HEMATOCRIT: 22.4 % — AB (ref 37.5–51.0)

## 2016-04-24 LAB — GLUCOSE, CAPILLARY
GLUCOSE-CAPILLARY: 64 mg/dL — AB (ref 65–99)
GLUCOSE-CAPILLARY: 84 mg/dL (ref 65–99)
Glucose-Capillary: 72 mg/dL (ref 65–99)
Glucose-Capillary: 92 mg/dL (ref 65–99)

## 2016-04-24 LAB — LEGIONELLA PNEUMOPHILA SEROGP 1 UR AG: L. PNEUMOPHILA SEROGP 1 UR AG: NEGATIVE

## 2016-04-24 LAB — HIV ANTIBODY (ROUTINE TESTING W REFLEX): HIV SCREEN 4TH GENERATION: NONREACTIVE

## 2016-04-24 MED ORDER — FERROUS SULFATE 325 (65 FE) MG PO TABS
325.0000 mg | ORAL_TABLET | Freq: Two times a day (BID) | ORAL | Status: DC
  Administered 2016-04-24 – 2016-04-25 (×2): 325 mg via ORAL
  Filled 2016-04-24 (×2): qty 1

## 2016-04-24 NOTE — Progress Notes (Signed)
Date:  April 24, 2016 Chart reviewed for concurrent status and case management needs. Will continue to follow patient progress. Transferring from sdu to med-surg floor. Discharge Planning: following for needs Expected discharge date: [id number] Velva Harman, BSN, [place], [place]

## 2016-04-24 NOTE — Progress Notes (Signed)
Offered patient lunch on 4 different occasions since tray arrived at 1230. Patient refused to eat each time.

## 2016-04-24 NOTE — Progress Notes (Signed)
PROGRESS NOTE                                                                                                                                                                                                             Patient Demographics:    [name], is a 50 y.o. male, DOB - Oct 23, 1965, ZR:[id number]  Admit date - 04/22/2016   Admitting Physician Rise Patience, MD  Outpatient Primary MD for the patient is ANDY,CAMILLE L, MD  LOS - 2  Outpatient Specialists:None  Chief Complaint  Patient presents with  . Shortness of Breath    sent from PCP  . Cough  . Fall       Brief Narrative  50 year old male with traumatic brain injury mental retardation, resident of group home sent with increased weakness and productive cough almost past 10 days. While being taken to his PCP had a fall at the facility without sustaining any injury or loss of consciousness. PCP send patient to the ED where he was found to be hypothermic with chest x-ray suggestive of pneumonia. Head CT was unremarkable. Lab work showed leukocytosis with worsening anemia. Patient admitted for sepsis (hypothermia, tachypnea and hypotension) likely secondary to Right Lobar pneumonia.   Subjective:   Patient poorly communicative, at baseline.    Assessment  & Plan :    Principal Problem:   Sepsis ([place]) Secondary to right lobar pneumonia. Continue empiric Rocephin and azithromycin. Sepsis resolved. Was hypothermic on presentation, had normal cortisol and TSH. Flu PCR, strep pneumonia antigen negative. Cultures negative. Stable for transfer to medical floor.  Active Problems: Hyperthermia Secondary to sepsis. Now resolved. TSH and cortisol level normal.  Iron deficiency Anemia Chronic with EGD done in 2013 showing distal esophageal ulcer. Stool for occult blood pending. Iron panel suggests iron deficiency will start supplement. Continue PPI.  Transfuse if hemoglobin <7.  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 Continue carbamazepine.  Thrombocytopenia possibly due to sepsis. Monitor closely.      Code Status : Full code  Family Communication  : Updated caregiver.  Disposition Plan  : Return to group home possibly tomorrow if stable. Barriers For Discharge : Improving symptoms  Consults  :  None  Procedures  : Head CT  DVT Prophylaxis  : SCDs  Lab Results  Component Value Date   PLT 101 (L) 04/24/2016    Antibiotics  :    Anti-infectives    Start     Dose/Rate Route Frequency Ordered Stop   04/23/16 2200  cefTRIAXone (ROCEPHIN) 1 g in dextrose 5 % 50 mL IVPB     1 g 100 mL/hr over 30 Minutes Intravenous Every 24 hours 04/23/16 0052 04/30/16 2159   04/23/16 2200  azithromycin (ZITHROMAX) 500 mg in dextrose 5 % 250 mL IVPB     500 mg 250 mL/hr over 60 Minutes Intravenous Every 24 hours 04/23/16 0052 04/30/16 2159   04/22/16 2345  cefTRIAXone (ROCEPHIN) 1 g in dextrose 5 % 50 mL IVPB     1 g 100 mL/hr over 30 Minutes Intravenous  Once 04/22/16 2340 04/23/16 0011   04/22/16 2345  azithromycin (ZITHROMAX) 500 mg in dextrose 5 % 250 mL IVPB     500 mg 250 mL/hr over 60 Minutes Intravenous  Once 04/22/16 2340 04/23/16 0143        Objective:   Vitals:   04/24/16 0600 04/24/16 0700 04/24/16 0800 04/24/16 0958  BP: 124/83 113/84 107/72   Pulse: 80 75 78   Resp: 15 13 16    Temp:   97 F (36.1 C) 98 F (36.7 C)  TempSrc:   Axillary Rectal  SpO2: 97% 97% 94%   Weight:      Height:        Wt Readings from Last 3 Encounters:  04/23/16 88.1 kg (194 lb 3.6 oz)  12/06/15 74.8 kg (165 lb)  09/03/15 74.8 kg (165 lb)     Intake/Output Summary (Last 24 hours) at 04/24/16 1107 Last data filed at 04/24/16 0800  Gross per 24 hour  Intake             3450 ml  Output             1025 ml  Net             2425 ml     Physical Exam  Gen: not in  distress HEENT:  moist mucosa, supple neck Chest: Fine right basilar crackles CVS: N S1&S2, no murmurs,  GI: soft, NT, ND,  Musculoskeletal: warm, no edema CNS: Alert and awake, poorly oriented.    Data Review:    CBC  Recent Labs Lab 04/22/16 2125 04/23/16 0354 04/24/16 0844  WBC 4.6 4.6 3.7*  HGB 9.4* 7.6* 7.4*  HCT 29.8* 24.8* 23.8*  PLT 143* 133* 101*  MCV 91.1 92.5 90.8  MCH 28.7 28.4 28.2  MCHC 31.5 30.6 31.1  RDW 15.8* 16.1* 16.6*  LYMPHSABS 0.5* 0.6*  --   MONOABS 0.3 0.4  --   EOSABS 0.0 0.0  --   BASOSABS 0.0 0.0  --     Chemistries   Recent Labs Lab 04/22/16 2125 04/23/16 0354  NA 142 144  K 3.7 3.7  CL 104 112*  CO2 31 26  GLUCOSE 80 52*  BUN 19 15  CREATININE 0.93 0.80  CALCIUM 9.6 8.3*  AST  --  33  ALT  --  46  ALKPHOS  --  130*  BILITOT  --  0.3   ------------------------------------------------------------------------------------------------------------------ No results for input(s): CHOL, HDL, LDLCALC, TRIG, CHOLHDL, LDLDIRECT in the last 72 hours.  No results found for: HGBA1C ------------------------------------------------------------------------------------------------------------------  Recent Labs  04/23/16 0050  TSH 2.387   ------------------------------------------------------------------------------------------------------------------  Recent Labs  04/23/16 1406  VITAMINB12 716  FERRITIN 33  TIBC 277  IRON 21*  Coagulation profile No results for input(s): INR, PROTIME in the last 168 hours.  No results for input(s): DDIMER in the last 72 hours.  Cardiac Enzymes  Recent Labs Lab 04/23/16 0711  TROPONINI <0.03   ------------------------------------------------------------------------------------------------------------------ No results found for: BNP  Inpatient Medications  Scheduled Meds: . azithromycin  500 mg Intravenous Q24H  . benztropine  0.5 mg Oral Daily  . carbamazepine  300 mg Oral BID    . cefTRIAXone (ROCEPHIN)  IV  1 g Intravenous Q24H  . clonazePAM  0.5 mg Oral QHS  . escitalopram  10 mg Oral Daily  . lamoTRIgine  100 mg Oral BID  . loratadine  10 mg Oral Daily  . mouth rinse  15 mL Mouth Rinse BID  . pantoprazole  40 mg Oral Daily  . risperiDONE  1 mg Oral BID  . traZODone  50 mg Oral QHS   Continuous Infusions: . sodium chloride 150 mL/hr at 04/24/16 0114   PRN Meds:.acetaminophen **OR** acetaminophen, ondansetron **OR** ondansetron (ZOFRAN) IV, RESOURCE THICKENUP CLEAR  Micro Results Recent Results (from the past 240 hour(s))  MRSA PCR Screening     Status: None   Collection Time: 04/23/16  2:13 AM  Result Value Ref Range Status   MRSA by PCR NEGATIVE NEGATIVE Final    Comment:        The GeneXpert MRSA Assay (FDA approved for NASAL specimens only), is one component of a comprehensive MRSA colonization surveillance program. It is not intended to diagnose MRSA infection nor to guide or monitor treatment for MRSA infections.     Radiology Reports Dg Chest 2 View  Result Date: 04/22/2016 CLINICAL DATA:  Cough productive of yellow sputum EXAM: CHEST  2 VIEW COMPARISON:  09/11/2014 CXR, CT abdomen the lower thorax and lung bases from 12/10/2010 FINDINGS: Patchy airspace opacities are noted at the right lung base suspicious for pneumonia possibly related to aspiration. Small right effusion obscuring the right costophrenic angle and right hemidiaphragm. Minimal atelectasis at the left lung base. There is mild pulmonary vascular congestion. The heart is borderline enlarged. The patient's chin obscures the apices. Sclerotic foci involving the left seventh and eighth ribs consistent with bony callus based on prior CT. Subtle old fracture deformity also noted of the left seventh and eighth ribs. IMPRESSION: 1. Patchy airspace opacities at the right lung base suspicious for pneumonia possibly related to aspiration pneumonia. 2. Small right effusion. 3. Mild vascular  congestion. 4. Left seventh and eighth rib sclerotic densities likely reflect bony callus from prior old trauma. Electronically Signed   By: Ashley Royalty M.D.   On: 04/22/2016 20:38   Ct Head Wo Contrast  Result Date: 04/22/2016 CLINICAL DATA:  Status post fall, with head injury. Initial encounter. EXAM: CT HEAD WITHOUT CONTRAST TECHNIQUE: Contiguous axial images were obtained from the base of the skull through the vertex without intravenous contrast. COMPARISON:  CT of the head performed 09/10/2015 FINDINGS: Brain: No evidence of acute infarction, hemorrhage, hydrocephalus, extra-axial collection or mass lesion/mass effect. Prominence of the ventricles and sulci reflects mild to moderate cortical volume loss. Chronic encephalomalacia is noted at the high left frontal lobe. Cerebellar atrophy is noted. The brainstem and fourth ventricle are within normal limits. The basal ganglia are unremarkable in appearance. No mass effect or midline shift is seen. Vascular: No hyperdense vessel or unexpected calcification. Skull: There is mild chronic deformity involving the nasal bone, with mild rightward displacement, stable from the prior study. There is no evidence of acute fracture.  Postoperative change is noted at the frontal calvarium bilaterally. Sinuses/Orbits: The orbits are within normal limits. The paranasal sinuses and mastoid air cells are well-aerated. Other: No significant soft tissue abnormalities are seen. IMPRESSION: 1. No acute intracranial pathology on CT. 2. Mild to moderate cortical volume loss noted. Chronic encephalomalacia at the high left frontal lobe. 3. Mild chronic deformity of the nasal bone, with mild rightward displacement, stable from the prior study. Electronically Signed   By: Garald Balding M.D.   On: 04/22/2016 23:21    Time Spent in minutes  25   Louellen Molder M.D on 04/24/2016 at 11:07 AM  Between 7am to 7pm - Pager - [phone]  After 7pm go to [email or web address] - password  [facility]  [facility] -  Office  [phone]

## 2016-04-25 DIAGNOSIS — D509 Iron deficiency anemia, unspecified: Secondary | ICD-10-CM | POA: Diagnosis present

## 2016-04-25 DIAGNOSIS — J690 Pneumonitis due to inhalation of food and vomit: Secondary | ICD-10-CM

## 2016-04-25 DIAGNOSIS — R4182 Altered mental status, unspecified: Secondary | ICD-10-CM

## 2016-04-25 LAB — GLUCOSE, CAPILLARY: Glucose-Capillary: 61 mg/dL — ABNORMAL LOW (ref 65–99)

## 2016-04-25 MED ORDER — LEVOFLOXACIN 750 MG PO TABS: 750.0000 mg | ORAL_TABLET | Freq: Every day | ORAL | 0 refills | Status: DC

## 2016-04-25 MED ORDER — FERROUS SULFATE 325 (65 FE) MG PO TABS: 325.0000 mg | ORAL_TABLET | Freq: Two times a day (BID) | ORAL | 3 refills | Status: DC

## 2016-04-25 NOTE — Discharge Summary (Signed)
Physician Discharge Summary  [name] [id number] DOB: 1966/03/10 DOA: 04/22/2016  PCP: [name], MD  Admit date: 04/22/2016 Discharge date: 04/25/2016  Admitted From: Group home Disposition: Return to group home  Recommendations for Outpatient Follow-up:  1. Follow up with PCP in 1-2 weeks 2. Patient started on iron supplement. Please monitor hemoglobin as outpatient. 3. Patient was completely total 7 day course of antibiotic on 12/6.  Home Health: None Equipment/Devices: None  Discharge Condition: Fair CODE STATUS: Full code Diet recommendation: Dysphagia level III with nectar thick liquid    Discharge Diagnoses:  Principal Problem:   Sepsis ([place])   Active Problems:   Community acquired pneumonia of right lower lobe of lung ([place])   Aspiration pneumonia in the right lung.   Depression   Generalized convulsive epilepsy ([place])   Symptomatic anemia   Mental retardation, mild (I.Q. 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 While being taken to his PCP had a fall at the facility without sustaining any injury or loss of consciousness. PCP send patient to the ED where he was found to be hypothermic with chest x-ray suggestive of pneumonia. Head CT was unremarkable. Lab work showed leukocytosis with worsening anemia. Patient admitted for sepsis (hypothermia, tachypnea and hypotension) likely secondary to Right Lobar pneumonia.  Principal Problem:   Sepsis ([place]) Secondary to right/aspiration lobar pneumonia. Placed on empiric Rocephin and azithromycin. Sepsis resolved. Was hypothermic on presentation, had normal cortisol and TSH. Flu PCR, strep pneumonia antigen negative. Cultures negative. Remains afebrile. Patient will  be discharged on oral Levaquin to complete 7 day course of antibiotics. Patient seen by speech and swallow nurse for concern of aspiration and recommended dysphagia level III diet with nectar thick liquid.  Active Problems: Hyperthermia Secondary to sepsis. Now resolved. TSH and cortisol level normal.  Iron deficiency Anemia Chronic anemia with EGD done in 2013 showing distal esophageal ulcer.  Iron panel suggests iron deficiency. will start supplement. Continue PPI. Follow-up hemoglobin as outpatient.  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 Continue carbamazepine.  Thrombocytopenia possibly due to sepsis. Follow-up as outpatient.        Family Communication  : Updated caregiver.  Disposition Plan  : Return to group home   Consults  :  None  Procedures  : Head CT  Discharge Instructions     Medication List    TAKE these medications   benztropine 0.5 MG tablet Commonly known as:  COGENTIN Take 0.5 mg by mouth daily.   carbamazepine 300 MG 12 hr capsule Commonly known as:  CARBATROL TAKE 1 CAPSULE BY MOUTH 2 TIMES DAILY   clonazePAM 0.5 MG tablet Commonly known as:  KLONOPIN Take 0.5 mg by mouth at bedtime. And 1 tablet as needed for anxiety   escitalopram 10 MG tablet Commonly known as:  LEXAPRO Take 10 mg by mouth daily.   ferrous sulfate 325 (65 FE) MG tablet Take 1 tablet (325 mg total) by mouth 2 (two) times daily with a meal.   LamoTRIgine XR 200 MG Tb24 TAKE 2 TABLETS BY MOUTH AT BEDTIME   levofloxacin 750 MG tablet Commonly known as:  LEVAQUIN Take 1 tablet (750 mg total) by mouth daily For 5 days   loratadine 10 MG  tablet Commonly known as:  CLARITIN Take 10 mg by mouth daily.   pantoprazole 40 MG tablet Commonly known as:  PROTONIX Take 40 mg by mouth daily.   risperiDONE 2 MG tablet Commonly known as:  RISPERDAL Take 1 mg by mouth 2 (two) times daily.   tamsulosin  0.4 MG Caps capsule Commonly known as:  FLOMAX Take 0.4 mg by mouth at bedtime.   traZODone 50 MG tablet Commonly known as:  DESYREL Take 50 mg by mouth at bedtime.      Follow-up Information    ANDY,CAMILLE L, MD. Schedule an appointment as soon as possible for a visit in 1 week(s).   Specialty:  Family Medicine Contact information: [address] [place] [address] [phone]          Allergies  Allergen Reactions  . Pineapple Anaphylaxis      Procedures/Studies: Dg Chest 2 View  Result Date: 04/22/2016 CLINICAL DATA:  Cough productive of yellow sputum EXAM: CHEST  2 VIEW COMPARISON:  09/11/2014 CXR, CT abdomen the lower thorax and lung bases from 12/10/2010 FINDINGS: Patchy airspace opacities are noted at the right lung base suspicious for pneumonia possibly related to aspiration. Small right effusion obscuring the right costophrenic angle and right hemidiaphragm. Minimal atelectasis at the left lung base. There is mild pulmonary vascular congestion. The heart is borderline enlarged. The patient's chin obscures the apices. Sclerotic foci involving the left seventh and eighth ribs consistent with bony callus based on prior CT. Subtle old fracture deformity also noted of the left seventh and eighth ribs. IMPRESSION: 1. Patchy airspace opacities at the right lung base suspicious for pneumonia possibly related to aspiration pneumonia. 2. Small right effusion. 3. Mild vascular congestion. 4. Left seventh and eighth rib sclerotic densities likely reflect bony callus from prior old trauma. Electronically Signed   By: Ashley Royalty M.D.   On: 04/22/2016 20:38   Ct Head Wo Contrast  Result Date: 04/22/2016 CLINICAL DATA:  Status post fall, with head injury. Initial encounter. EXAM: CT HEAD WITHOUT CONTRAST TECHNIQUE: Contiguous axial images were obtained from the base of the skull through the vertex without intravenous contrast. COMPARISON:  CT of the head performed  09/10/2015 FINDINGS: Brain: No evidence of acute infarction, hemorrhage, hydrocephalus, extra-axial collection or mass lesion/mass effect. Prominence of the ventricles and sulci reflects mild to moderate cortical volume loss. Chronic encephalomalacia is noted at the high left frontal lobe. Cerebellar atrophy is noted. The brainstem and fourth ventricle are within normal limits. The basal ganglia are unremarkable in appearance. No mass effect or midline shift is seen. Vascular: No hyperdense vessel or unexpected calcification. Skull: There is mild chronic deformity involving the nasal bone, with mild rightward displacement, stable from the prior study. There is no evidence of acute fracture. Postoperative change is noted at the frontal calvarium bilaterally. Sinuses/Orbits: The orbits are within normal limits. The paranasal sinuses and mastoid air cells are well-aerated. Other: No significant soft tissue abnormalities are seen. IMPRESSION: 1. No acute intracranial pathology on CT. 2. Mild to moderate cortical volume loss noted. Chronic encephalomalacia at the high left frontal lobe. 3. Mild chronic deformity of the nasal bone, with mild rightward displacement, stable from the prior study. Electronically Signed   By: Garald Balding M.D.   On: 04/22/2016 23:21       Subjective: [place] afebrile. No overnight issues.  Discharge Exam: Vitals:   04/25/16 0200 04/25/16 0630  BP:  115/76  Pulse: 77 73  Resp:  16 16  Temp:  97.5 F (36.4 C)   Vitals:   04/24/16 1455 04/24/16 2127 04/25/16 0200 04/25/16 0630  BP: 125/61 111/79  115/76  Pulse: 80 79 77 73  Resp:  17 16 16   Temp: 100.3 F (37.9 C) 98.1 F (36.7 C)  97.5 F (36.4 C)  TempSrc: Rectal Oral  Oral  SpO2:  97% 92% 98%  Weight:      Height:        Gen: not in distress HEENT:  moist mucosa, supple neck Chest: Fine right basilar crackles CVS: N S1&S2, no murmurs,  GI: soft, NT, ND,  Musculoskeletal: warm, no edema CNS: Alert and  awake, poorly oriented.     The results of significant diagnostics from this hospitalization (including imaging, microbiology, ancillary and laboratory) are listed below for reference.     Microbiology: Recent Results (from the past 240 hour(s))  Culture, blood (routine x 2)     Status: None (Preliminary result)   Collection Time: 04/23/16 12:50 AM  Result Value Ref Range Status   Specimen Description BLOOD RIGHT ANTECUBITAL  Final   Special Requests IN PEDIATRIC BOTTLE 3 CC  Final   Culture   Final    NO GROWTH 1 DAY Performed at [facility]    Report Status PENDING  Incomplete  MRSA PCR Screening     Status: None   Collection Time: 04/23/16  2:13 AM  Result Value Ref Range Status   MRSA by PCR NEGATIVE NEGATIVE Final    Comment:        The GeneXpert MRSA Assay (FDA approved for NASAL specimens only), is one component of a comprehensive MRSA colonization surveillance program. It is not intended to diagnose MRSA infection nor to guide or monitor treatment for MRSA infections.   Culture, blood (routine x 2)     Status: None (Preliminary result)   Collection Time: 04/23/16  3:54 AM  Result Value Ref Range Status   Specimen Description BLOOD LEFT ARM  Final   Special Requests IN PEDIATRIC BOTTLE 4CC  Final   Culture   Final    NO GROWTH 1 DAY Performed at [facility]    Report Status PENDING  Incomplete     Labs: BNP (last 3 results) No results for input(s): BNP in the last 8760 hours. Basic Metabolic Panel:  Recent Labs Lab 04/22/16 2125 04/23/16 0354  NA 142 144  K 3.7 3.7  CL 104 112*  CO2 31 26  GLUCOSE 80 52*  BUN 19 15  CREATININE 0.93 0.80  CALCIUM 9.6 8.3*   Liver Function Tests:  Recent Labs Lab 04/23/16 0354  AST 33  ALT 46  ALKPHOS 130*  BILITOT 0.3  PROT 6.1*  ALBUMIN 2.7*   No results for input(s): LIPASE, AMYLASE in the last 168 hours. No results for input(s): AMMONIA in the last 168 hours. CBC:  Recent  Labs Lab 04/22/16 2125 04/23/16 0354 04/23/16 1406 04/24/16 0844  WBC 4.6 4.6  --  3.7*  NEUTROABS 3.7 3.6  --   --   HGB 9.4* 7.6*  --  7.4*  HCT 29.8* 24.8* 22.4* 23.8*  MCV 91.1 92.5  --  90.8  PLT 143* 133*  --  101*   Cardiac Enzymes:  Recent Labs Lab 04/23/16 0711  TROPONINI <0.03   BNP: Invalid input(s): POCBNP CBG:  Recent Labs Lab 04/24/16 1136 04/24/16 1217 04/24/16 1441 04/24/16 2310 04/25/16 0722  GLUCAP 64* 84 72 92 61*  D-Dimer No results for input(s): DDIMER in the last 72 hours. Hgb A1c No results for input(s): HGBA1C in the last 72 hours. Lipid Profile No results for input(s): CHOL, HDL, LDLCALC, TRIG, CHOLHDL, LDLDIRECT in the last 72 hours. Thyroid function studies  Recent Labs  04/23/16 0050  TSH 2.387   Anemia work up  Recent Labs  04/23/16 1406  VITAMINB12 716  FERRITIN 33  TIBC 277  IRON 21*   Urinalysis    Component Value Date/Time   COLORURINE YELLOW 09/11/2014 [place] 09/11/2014 1951   LABSPEC 1.010 09/11/2014 1951   PHURINE 7.0 09/11/2014 1951   GLUCOSEU NEGATIVE 09/11/2014 1951   HGBUR NEGATIVE 09/11/2014 1951   HGBUR negative 07/31/2009 [place] 09/11/2014 1951   KETONESUR NEGATIVE 09/11/2014 1951   PROTEINUR NEGATIVE 09/11/2014 1951   UROBILINOGEN 0.2 09/11/2014 1951   NITRITE NEGATIVE 09/11/2014 1951   LEUKOCYTESUR NEGATIVE 09/11/2014 1951   Sepsis Labs Invalid input(s): PROCALCITONIN,  WBC,  LACTICIDVEN Microbiology Recent Results (from the past 240 hour(s))  Culture, blood (routine x 2)     Status: None (Preliminary result)   Collection Time: 04/23/16 12:50 AM  Result Value Ref Range Status   Specimen Description BLOOD RIGHT ANTECUBITAL  Final   Special Requests IN PEDIATRIC BOTTLE 3 CC  Final   Culture   Final    NO GROWTH 1 DAY Performed at [facility]    Report Status PENDING  Incomplete  MRSA PCR Screening     Status: None   Collection Time:  04/23/16  2:13 AM  Result Value Ref Range Status   MRSA by PCR NEGATIVE NEGATIVE Final    Comment:        The GeneXpert MRSA Assay (FDA approved for NASAL specimens only), is one component of a comprehensive MRSA colonization surveillance program. It is not intended to diagnose MRSA infection nor to guide or monitor treatment for MRSA infections.   Culture, blood (routine x 2)     Status: None (Preliminary result)   Collection Time: 04/23/16  3:54 AM  Result Value Ref Range Status   Specimen Description BLOOD LEFT ARM  Final   Special Requests IN PEDIATRIC BOTTLE 4CC  Final   Culture   Final    NO GROWTH 1 DAY Performed at [facility]    Report Status PENDING  Incomplete     Time coordinating discharge: Over 30 minutes  SIGNED:   Louellen Molder, MD  [facility] 04/25/2016, 11:06 AM Pager   If 7PM-7AM, please contact night-coverage [email or web address] Password [email or web address]

## 2016-04-25 NOTE — NC FL2 (Signed)
[place] LEVEL OF CARE SCREENING TOOL     IDENTIFICATION  Patient Name: [name] Birthdate: May 18, 1966 Sex: male Admission Date (Current Location): 04/22/2016  [facility] and [place] Number:  Herbalist and Address:  [facility],  [place] [address], [place]      Provider Number: [id number]  Attending Physician Name and Address:  Louellen Molder, MD  Relative Name and Phone Number:       Current Level of Care: Hospital Recommended Level of Care: Other (Comment) (Group Home) Prior Approval Number:    Date Approved/Denied:   PASRR Number:    Discharge Plan: Other (Comment) (Group Home)    Current Diagnoses: Patient Active Problem List   Diagnosis Date Noted  . Anemia, iron deficiency 04/25/2016  . Altered mental status   . Aspiration pneumonia of right lower lobe due to gastric secretions ([place])   . Sepsis ([place]) 04/23/2016  . Pressure injury of skin 04/23/2016  . Community acquired pneumonia of right lower lobe of lung ([place]) 04/22/2016  . Symptomatic anemia 02/22/2015  . Closed head injury 02/22/2015  . 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 Anxiety state 02/22/2015  . 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 Depression 03/28/2007  . 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    Orientation RESPIRATION BLADDER Height & Weight     Self  Normal Incontinent Weight: 194 lb 3.6 oz (88.1 kg) Height:  6\' 2"  (188 cm)  BEHAVIORAL SYMPTOMS/MOOD NEUROLOGICAL BOWEL NUTRITION STATUS  Other (Comment) (no behaviors) Convulsions/Seizures Incontinent Diet (DYS 3 with nectar thicken liquids)  AMBULATORY STATUS COMMUNICATION OF NEEDS Skin   Extensive Assist Verbally Other (Comment) (stage 1 left ankle)                       Personal Care Assistance Level of Assistance  Bathing, Feeding, Dressing Bathing Assistance: Limited assistance Feeding assistance: Limited assistance Dressing Assistance: Limited assistance      Functional Limitations Info  Sight, Hearing, Speech Sight Info: Adequate Hearing Info: Adequate Speech Info: Impaired    SPECIAL CARE FACTORS FREQUENCY                       Contractures Contractures Info: Not present    Additional Factors Info  Allergies, Psychotropic (Full Code) Code Status Info: Full Code Allergies Info: PINEAPPLE           Current Medications (04/25/2016):  This is the current hospital active medication list Current Facility-Administered Medications  Medication Dose Route Frequency Provider Last Rate Last Dose  . 0.9 %  sodium chloride infusion   Intravenous Continuous Rise Patience, MD 150 mL/hr at 04/25/16 0854 150 mL at 04/25/16 0854  . acetaminophen (TYLENOL) tablet 650 mg  650 mg Oral Q6H PRN Rise Patience, MD       Or  . acetaminophen (TYLENOL) suppository 650 mg  650 mg Rectal Q6H PRN Rise Patience, MD      . azithromycin (ZITHROMAX) 500 mg in dextrose 5 % 250 mL IVPB  500 mg Intravenous Q24H Rise Patience, MD   500 mg at 04/24/16 2132  . benztropine (COGENTIN) tablet 0.5 mg  0.5 mg Oral Daily Rise Patience, MD   0.5 mg at 04/25/16 0854  . carbamazepine (CARBATROL) 12 hr capsule 300 mg  300 mg Oral BID Rise Patience, MD   300 mg at 04/25/16 0843  . cefTRIAXone (ROCEPHIN) 1 g in dextrose  5 % 50 mL IVPB  1 g Intravenous Q24H Rise Patience, MD   1 g at 04/24/16 2241  . clonazePAM (KLONOPIN) tablet 0.5 mg  0.5 mg Oral QHS Rise Patience, MD   0.5 mg at 04/24/16 2134  . escitalopram (LEXAPRO) tablet 10 mg  10 mg Oral Daily Rise Patience, MD   10 mg at 04/25/16 0843  . ferrous sulfate tablet 325 mg  325 mg Oral BID WC Nishant Dhungel, MD   325 mg at 04/25/16 0842  . lamoTRIgine (LAMICTAL) tablet 100 mg  100 mg Oral BID Rise Patience, MD   100 mg at 04/25/16 0843  . loratadine (CLARITIN) tablet 10 mg  10 mg Oral Daily Rise Patience, MD   10 mg at 04/25/16 0842  . MEDLINE mouth rinse   15 mL Mouth Rinse BID Rise Patience, MD   15 mL at 04/25/16 0842  . ondansetron (ZOFRAN) tablet 4 mg  4 mg Oral Q6H PRN Rise Patience, MD       Or  . ondansetron [facility]) injection 4 mg  4 mg Intravenous Q6H PRN Rise Patience, MD      . pantoprazole (PROTONIX) EC tablet 40 mg  40 mg Oral Daily Rise Patience, MD   40 mg at 04/25/16 0843  . RESOURCE THICKENUP CLEAR   Oral PRN Nishant Dhungel, MD      . risperiDONE (RISPERDAL) tablet 1 mg  1 mg Oral BID Rise Patience, MD   1 mg at 04/25/16 0843  . traZODone (DESYREL) tablet 50 mg  50 mg Oral QHS Rise Patience, MD   50 mg at 04/24/16 2134     Discharge Medications: Please see discharge summary for a list of discharge medications. Medication List    TAKE these medications   benztropine 0.5 MG tablet Commonly known as:  COGENTIN Take 0.5 mg by mouth daily.  carbamazepine 300 MG 12 hr capsule Commonly known as:  CARBATROL TAKE 1 CAPSULE BY MOUTH 2 TIMES DAILY  clonazePAM 0.5 MG tablet Commonly known as:  KLONOPIN Take 0.5 mg by mouth at bedtime. And 1 tablet as needed for anxiety  escitalopram 10 MG tablet Commonly known as:  LEXAPRO Take 10 mg by mouth daily.  ferrous sulfate 325 (65 FE) MG tablet Take 1 tablet (325 mg total) by mouth 2 (two) times daily with a meal.  LamoTRIgine XR 200 MG Tb24 TAKE 2 TABLETS BY MOUTH AT BEDTIME  levofloxacin 750 MG tablet Commonly known as:  LEVAQUIN Take 1 tablet (750 mg total) by mouth daily For 5 days  loratadine 10 MG tablet Commonly known as:  CLARITIN Take 10 mg by mouth daily.  pantoprazole 40 MG tablet Commonly known as:  PROTONIX Take 40 mg by mouth daily.  risperiDONE 2 MG tablet Commonly known as:  RISPERDAL Take 1 mg by mouth 2 (two) times daily.  tamsulosin 0.4 MG Caps capsule Commonly known as:  FLOMAX Take 0.4 mg by mouth at bedtime.  traZODone 50 MG tablet Commonly known as:  DESYREL Take 50 mg by mouth at bedtime.      Relevant  Imaging Results:  Relevant Lab Results:   Additional Information SS # [id number]  Micholas Drumwright, Randall An, LCSW

## 2016-04-28 LAB — CULTURE, BLOOD (ROUTINE X 2)
Culture: NO GROWTH
Culture: NO GROWTH

## 2016-04-30 ENCOUNTER — Emergency Department (HOSPITAL_COMMUNITY): Payer: Medicare Other

## 2016-04-30 ENCOUNTER — Encounter (HOSPITAL_COMMUNITY): Payer: Self-pay | Admitting: *Deleted

## 2016-04-30 ENCOUNTER — Inpatient Hospital Stay (HOSPITAL_COMMUNITY)
Admission: EM | Admit: 2016-04-30 | Discharge: 2016-05-17 | DRG: 853 | Disposition: A | Discharge status: Skilled Nursing Facility | Payer: Medicare Other | Attending: Family Medicine | Admitting: Family Medicine

## 2016-04-30 DIAGNOSIS — J939 Pneumothorax, unspecified: Secondary | ICD-10-CM

## 2016-04-30 DIAGNOSIS — J9811 Atelectasis: Secondary | ICD-10-CM | POA: Diagnosis not present

## 2016-04-30 DIAGNOSIS — N400 Enlarged prostate without lower urinary tract symptoms: Secondary | ICD-10-CM | POA: Diagnosis present

## 2016-04-30 DIAGNOSIS — Z87891 Personal history of nicotine dependence: Secondary | ICD-10-CM

## 2016-04-30 DIAGNOSIS — Z781 Physical restraint status: Secondary | ICD-10-CM | POA: Diagnosis not present

## 2016-04-30 DIAGNOSIS — J9601 Acute respiratory failure with hypoxia: Secondary | ICD-10-CM

## 2016-04-30 DIAGNOSIS — J189 Pneumonia, unspecified organism: Secondary | ICD-10-CM | POA: Diagnosis not present

## 2016-04-30 DIAGNOSIS — E2740 Unspecified adrenocortical insufficiency: Secondary | ICD-10-CM | POA: Diagnosis not present

## 2016-04-30 DIAGNOSIS — I959 Hypotension, unspecified: Secondary | ICD-10-CM

## 2016-04-30 DIAGNOSIS — Z8711 Personal history of peptic ulcer disease: Secondary | ICD-10-CM | POA: Diagnosis not present

## 2016-04-30 DIAGNOSIS — Z4659 Encounter for fitting and adjustment of other gastrointestinal appliance and device: Secondary | ICD-10-CM

## 2016-04-30 DIAGNOSIS — J80 Acute respiratory distress syndrome: Secondary | ICD-10-CM | POA: Diagnosis not present

## 2016-04-30 DIAGNOSIS — R6521 Severe sepsis with septic shock: Secondary | ICD-10-CM | POA: Diagnosis present

## 2016-04-30 DIAGNOSIS — J9690 Respiratory failure, unspecified, unspecified whether with hypoxia or hypercapnia: Secondary | ICD-10-CM

## 2016-04-30 DIAGNOSIS — Y95 Nosocomial condition: Secondary | ICD-10-CM | POA: Diagnosis present

## 2016-04-30 DIAGNOSIS — R918 Other nonspecific abnormal finding of lung field: Secondary | ICD-10-CM

## 2016-04-30 DIAGNOSIS — D696 Thrombocytopenia, unspecified: Secondary | ICD-10-CM | POA: Diagnosis present

## 2016-04-30 DIAGNOSIS — R748 Abnormal levels of other serum enzymes: Secondary | ICD-10-CM | POA: Diagnosis not present

## 2016-04-30 DIAGNOSIS — R1310 Dysphagia, unspecified: Secondary | ICD-10-CM | POA: Diagnosis present

## 2016-04-30 DIAGNOSIS — I2699 Other pulmonary embolism without acute cor pulmonale: Secondary | ICD-10-CM

## 2016-04-30 DIAGNOSIS — R579 Shock, unspecified: Secondary | ICD-10-CM

## 2016-04-30 DIAGNOSIS — R0902 Hypoxemia: Secondary | ICD-10-CM

## 2016-04-30 DIAGNOSIS — R739 Hyperglycemia, unspecified: Secondary | ICD-10-CM | POA: Diagnosis not present

## 2016-04-30 DIAGNOSIS — A419 Sepsis, unspecified organism: Principal | ICD-10-CM | POA: Diagnosis present

## 2016-04-30 DIAGNOSIS — J9622 Acute and chronic respiratory failure with hypercapnia: Secondary | ICD-10-CM | POA: Diagnosis not present

## 2016-04-30 DIAGNOSIS — G92 Toxic encephalopathy: Secondary | ICD-10-CM | POA: Diagnosis present

## 2016-04-30 DIAGNOSIS — R042 Hemoptysis: Secondary | ICD-10-CM | POA: Diagnosis present

## 2016-04-30 DIAGNOSIS — J9620 Acute and chronic respiratory failure, unspecified whether with hypoxia or hypercapnia: Secondary | ICD-10-CM

## 2016-04-30 DIAGNOSIS — Z01818 Encounter for other preprocedural examination: Secondary | ICD-10-CM

## 2016-04-30 DIAGNOSIS — F319 Bipolar disorder, unspecified: Secondary | ICD-10-CM | POA: Diagnosis present

## 2016-04-30 DIAGNOSIS — F70 Mild intellectual disabilities: Secondary | ICD-10-CM | POA: Diagnosis present

## 2016-04-30 DIAGNOSIS — F419 Anxiety disorder, unspecified: Secondary | ICD-10-CM | POA: Diagnosis present

## 2016-04-30 DIAGNOSIS — J9621 Acute and chronic respiratory failure with hypoxia: Secondary | ICD-10-CM | POA: Diagnosis present

## 2016-04-30 DIAGNOSIS — D509 Iron deficiency anemia, unspecified: Secondary | ICD-10-CM | POA: Diagnosis present

## 2016-04-30 DIAGNOSIS — T380X5A Adverse effect of glucocorticoids and synthetic analogues, initial encounter: Secondary | ICD-10-CM | POA: Diagnosis not present

## 2016-04-30 DIAGNOSIS — Z79899 Other long term (current) drug therapy: Secondary | ICD-10-CM | POA: Diagnosis not present

## 2016-04-30 DIAGNOSIS — G800 Spastic quadriplegic cerebral palsy: Secondary | ICD-10-CM

## 2016-04-30 DIAGNOSIS — G40909 Epilepsy, unspecified, not intractable, without status epilepticus: Secondary | ICD-10-CM | POA: Diagnosis present

## 2016-04-30 DIAGNOSIS — I248 Other forms of acute ischemic heart disease: Secondary | ICD-10-CM | POA: Diagnosis not present

## 2016-04-30 DIAGNOSIS — R0602 Shortness of breath: Secondary | ICD-10-CM | POA: Diagnosis not present

## 2016-04-30 DIAGNOSIS — G8250 Quadriplegia, unspecified: Secondary | ICD-10-CM

## 2016-04-30 DIAGNOSIS — R451 Restlessness and agitation: Secondary | ICD-10-CM | POA: Diagnosis not present

## 2016-04-30 DIAGNOSIS — R0600 Dyspnea, unspecified: Secondary | ICD-10-CM | POA: Diagnosis present

## 2016-04-30 DIAGNOSIS — Z0189 Encounter for other specified special examinations: Secondary | ICD-10-CM

## 2016-04-30 LAB — BRAIN NATRIURETIC PEPTIDE: B NATRIURETIC PEPTIDE 5: 184.3 pg/mL — AB (ref 0.0–100.0)

## 2016-04-30 LAB — COMPREHENSIVE METABOLIC PANEL
ALBUMIN: 2.7 g/dL — AB (ref 3.5–5.0)
ALT: 29 U/L (ref 17–63)
AST: 28 U/L (ref 15–41)
Alkaline Phosphatase: 117 U/L (ref 38–126)
Anion gap: 8 (ref 5–15)
BUN: 12 mg/dL (ref 6–20)
CHLORIDE: 106 mmol/L (ref 101–111)
CO2: 30 mmol/L (ref 22–32)
CREATININE: 0.89 mg/dL (ref 0.61–1.24)
Calcium: 8.9 mg/dL (ref 8.9–10.3)
GFR calc Af Amer: >60 mL/min (ref >60)
GFR calc non Af Amer: >60 mL/min (ref >60)
GLUCOSE: 91 mg/dL (ref 65–99)
Potassium: 4.2 mmol/L (ref 3.5–5.1)
SODIUM: 144 mmol/L (ref 135–145)
Total Bilirubin: 0.4 mg/dL (ref 0.3–1.2)
Total Protein: 6 g/dL — ABNORMAL LOW (ref 6.5–8.1)

## 2016-04-30 LAB — LACTATE DEHYDROGENASE: LDH: 189 U/L (ref 98–192)

## 2016-04-30 LAB — CBC
HCT: 20.8 % — ABNORMAL LOW (ref 39.0–52.0)
Hemoglobin: 6.4 g/dL — CL (ref 13.0–17.0)
MCH: 28.2 pg (ref 26.0–34.0)
MCHC: 30.8 g/dL (ref 30.0–36.0)
MCV: 91.6 fL (ref 78.0–100.0)
PLATELETS: 121 10*3/uL — AB (ref 150–400)
RBC: 2.27 MIL/uL — AB (ref 4.22–5.81)
RDW: 16.6 % — ABNORMAL HIGH (ref 11.5–15.5)
WBC: 4.5 10*3/uL (ref 4.0–10.5)

## 2016-04-30 LAB — URINALYSIS, ROUTINE W REFLEX MICROSCOPIC
BILIRUBIN URINE: NEGATIVE
Glucose, UA: NEGATIVE mg/dL
HGB URINE DIPSTICK: NEGATIVE
Ketones, ur: NEGATIVE mg/dL
Leukocytes, UA: NEGATIVE
Nitrite: NEGATIVE
Protein, ur: NEGATIVE mg/dL
SPECIFIC GRAVITY, URINE: 1.01 (ref 1.005–1.030)
pH: 6 (ref 5.0–8.0)

## 2016-04-30 LAB — TROPONIN I: TROPONIN I: 0.12 ng/mL — AB (ref <0.03)

## 2016-04-30 LAB — BLOOD GAS, ARTERIAL
Acid-Base Excess: 4.9 mmol/L — ABNORMAL HIGH (ref 0.0–2.0)
BICARBONATE: 31.4 mmol/L — AB (ref 20.0–28.0)
DRAWN BY: 331471
FIO2: 1
O2 Saturation: 98.9 %
PH ART: 7.409 (ref 7.350–7.450)
Patient temperature: 89.4
pCO2 arterial: 47 mmHg (ref 32.0–48.0)
pO2, Arterial: 150 mmHg — ABNORMAL HIGH (ref 83.0–108.0)

## 2016-04-30 LAB — CBC WITH DIFFERENTIAL/PLATELET
Basophils Absolute: 0 10*3/uL (ref 0.0–0.1)
Basophils Relative: 0 %
EOS ABS: 0.1 10*3/uL (ref 0.0–0.7)
EOS PCT: 2 %
HCT: 23.9 % — ABNORMAL LOW (ref 39.0–52.0)
HEMOGLOBIN: 7.5 g/dL — AB (ref 13.0–17.0)
LYMPHS ABS: 0.6 10*3/uL — AB (ref 0.7–4.0)
Lymphocytes Relative: 10 %
MCH: 28.8 pg (ref 26.0–34.0)
MCHC: 31.4 g/dL (ref 30.0–36.0)
MCV: 91.9 fL (ref 78.0–100.0)
MONOS PCT: 7 %
Monocytes Absolute: 0.4 10*3/uL (ref 0.1–1.0)
NEUTROS PCT: 81 %
Neutro Abs: 5 10*3/uL (ref 1.7–7.7)
Platelets: 143 10*3/uL — ABNORMAL LOW (ref 150–400)
RBC: 2.6 MIL/uL — ABNORMAL LOW (ref 4.22–5.81)
RDW: 16.5 % — ABNORMAL HIGH (ref 11.5–15.5)
WBC: 6.1 10*3/uL (ref 4.0–10.5)

## 2016-04-30 LAB — CREATININE, SERUM
CREATININE: 0.73 mg/dL (ref 0.61–1.24)
GFR calc Af Amer: >60 mL/min (ref >60)
GFR calc non Af Amer: >60 mL/min (ref >60)

## 2016-04-30 LAB — SAVE SMEAR

## 2016-04-30 LAB — INFLUENZA PANEL BY PCR (TYPE A & B)
INFLAPCR: NEGATIVE
INFLBPCR: NEGATIVE

## 2016-04-30 LAB — PREPARE RBC (CROSSMATCH)

## 2016-04-30 LAB — GLUCOSE, CAPILLARY
GLUCOSE-CAPILLARY: 117 mg/dL — AB (ref 65–99)
GLUCOSE-CAPILLARY: 73 mg/dL (ref 65–99)
Glucose-Capillary: 67 mg/dL (ref 65–99)

## 2016-04-30 LAB — MAGNESIUM: MAGNESIUM: 1.8 mg/dL (ref 1.7–2.4)

## 2016-04-30 LAB — STREP PNEUMONIAE URINARY ANTIGEN: Strep Pneumo Urinary Antigen: NEGATIVE

## 2016-04-30 LAB — PHOSPHORUS: Phosphorus: 3.4 mg/dL (ref 2.5–4.6)

## 2016-04-30 LAB — TSH: TSH: 3.56 u[IU]/mL (ref 0.350–4.500)

## 2016-04-30 LAB — PROTIME-INR
INR: 1.1
PROTHROMBIN TIME: 14.2 s (ref 11.4–15.2)

## 2016-04-30 LAB — I-STAT CG4 LACTIC ACID, ED
LACTIC ACID, VENOUS: 0.78 mmol/L (ref 0.5–1.9)
Lactic Acid, Venous: 2.04 mmol/L (ref 0.5–1.9)

## 2016-04-30 LAB — PROCALCITONIN: PROCALCITONIN: 0.11 ng/mL

## 2016-04-30 LAB — CORTISOL: Cortisol, Plasma: 12.9 ug/dL

## 2016-04-30 MED ORDER — VANCOMYCIN HCL 500 MG IV SOLR
500.0000 mg | Freq: Once | INTRAVENOUS | Status: DC
  Filled 2016-04-30: qty 500

## 2016-04-30 MED ORDER — ACETAMINOPHEN 325 MG PO TABS: 650.0000 mg | ORAL_TABLET | ORAL | Status: DC | PRN

## 2016-04-30 MED ORDER — ASPIRIN 300 MG RE SUPP: 300.0000 mg | RECTAL | Status: AC

## 2016-04-30 MED ORDER — ORAL CARE MOUTH RINSE
15.0000 mL | Freq: Four times a day (QID) | OROMUCOSAL | Status: DC
  Administered 2016-05-01 – 2016-05-09 (×29): 15 mL via OROMUCOSAL

## 2016-04-30 MED ORDER — FENTANYL CITRATE (PF) 100 MCG/2ML IJ SOLN
25.0000 ug | INTRAMUSCULAR | Status: DC | PRN
  Administered 2016-04-30 (×3): 50 ug via INTRAVENOUS
  Administered 2016-05-01: 25 ug via INTRAVENOUS
  Administered 2016-05-01 – 2016-05-03 (×9): 50 ug via INTRAVENOUS
  Administered 2016-05-04: 25 ug via INTRAVENOUS
  Administered 2016-05-04: 50 ug via INTRAVENOUS
  Filled 2016-04-30 (×15): qty 2

## 2016-04-30 MED ORDER — SODIUM CHLORIDE 0.9 % IV BOLUS (SEPSIS)
1000.0000 mL | Freq: Once | INTRAVENOUS | Status: AC
  Administered 2016-04-30: 1000 mL via INTRAVENOUS

## 2016-04-30 MED ORDER — VANCOMYCIN HCL IN DEXTROSE 1-5 GM/200ML-% IV SOLN
1000.0000 mg | Freq: Three times a day (TID) | INTRAVENOUS | Status: DC
  Administered 2016-04-30 – 2016-05-01 (×4): 1000 mg via INTRAVENOUS
  Filled 2016-04-30 (×4): qty 200

## 2016-04-30 MED ORDER — FAMOTIDINE 20 MG PO TABS
20.0000 mg | ORAL_TABLET | Freq: Two times a day (BID) | ORAL | Status: DC
  Administered 2016-04-30 – 2016-05-11 (×22): 20 mg via ORAL
  Filled 2016-04-30 (×22): qty 1

## 2016-04-30 MED ORDER — VITAL HIGH PROTEIN PO LIQD: 1000.0000 mL | ORAL | Status: DC

## 2016-04-30 MED ORDER — DEXTROSE 5 % IV SOLN
2.0000 g | INTRAVENOUS | Status: AC
  Administered 2016-04-30: 2 g via INTRAVENOUS
  Filled 2016-04-30: qty 2

## 2016-04-30 MED ORDER — PRO-STAT SUGAR FREE PO LIQD
30.0000 mL | Freq: Two times a day (BID) | ORAL | Status: DC
  Administered 2016-04-30: 30 mL
  Filled 2016-04-30 (×2): qty 30

## 2016-04-30 MED ORDER — ETOMIDATE 2 MG/ML IV SOLN
25.0000 mg | Freq: Once | INTRAVENOUS | Status: AC
  Administered 2016-04-30: 25 mg via INTRAVENOUS

## 2016-04-30 MED ORDER — DEXTROSE 50 % IV SOLN
INTRAVENOUS | Status: AC
  Filled 2016-04-30: qty 50

## 2016-04-30 MED ORDER — MIDAZOLAM HCL 2 MG/2ML IJ SOLN
1.0000 mg | INTRAMUSCULAR | Status: DC | PRN
  Administered 2016-04-30: 4 mg via INTRAVENOUS
  Administered 2016-04-30 – 2016-05-01 (×4): 2 mg via INTRAVENOUS
  Administered 2016-05-01 (×2): 4 mg via INTRAVENOUS
  Administered 2016-05-01 (×3): 2 mg via INTRAVENOUS
  Administered 2016-05-01: 4 mg via INTRAVENOUS
  Administered 2016-05-02 – 2016-05-04 (×2): 2 mg via INTRAVENOUS
  Administered 2016-05-04: 4 mg via INTRAVENOUS
  Administered 2016-05-04 – 2016-05-05 (×6): 2 mg via INTRAVENOUS
  Filled 2016-04-30: qty 4
  Filled 2016-04-30: qty 2
  Filled 2016-04-30: qty 4
  Filled 2016-04-30 (×4): qty 2
  Filled 2016-04-30: qty 4
  Filled 2016-04-30: qty 2
  Filled 2016-04-30: qty 4
  Filled 2016-04-30: qty 2
  Filled 2016-04-30 (×2): qty 4
  Filled 2016-04-30: qty 2
  Filled 2016-04-30: qty 4
  Filled 2016-04-30 (×3): qty 2
  Filled 2016-04-30: qty 4
  Filled 2016-04-30: qty 2
  Filled 2016-04-30: qty 4

## 2016-04-30 MED ORDER — CHLORHEXIDINE GLUCONATE 0.12% ORAL RINSE (MEDLINE KIT)
15.0000 mL | Freq: Two times a day (BID) | OROMUCOSAL | Status: DC
  Administered 2016-04-30 – 2016-05-09 (×17): 15 mL via OROMUCOSAL

## 2016-04-30 MED ORDER — SODIUM CHLORIDE 0.9 % IV SOLN: INTRAVENOUS | Status: DC

## 2016-04-30 MED ORDER — SODIUM CHLORIDE 0.9 % IV SOLN: 250.0000 mL | INTRAVENOUS | Status: DC | PRN

## 2016-04-30 MED ORDER — PHENYLEPHRINE HCL 10 MG/ML IJ SOLN
0.0000 ug/min | INTRAVENOUS | Status: DC
  Administered 2016-04-30: 35 ug/min via INTRAVENOUS
  Administered 2016-04-30: 20 ug/min via INTRAVENOUS
  Administered 2016-05-01: 50 ug/min via INTRAVENOUS
  Administered 2016-05-01: 40 ug/min via INTRAVENOUS
  Administered 2016-05-01: 20 ug/min via INTRAVENOUS
  Filled 2016-04-30 (×5): qty 1

## 2016-04-30 MED ORDER — ALBUTEROL SULFATE (2.5 MG/3ML) 0.083% IN NEBU: 2.5000 mg | INHALATION_SOLUTION | RESPIRATORY_TRACT | Status: DC | PRN

## 2016-04-30 MED ORDER — MIDAZOLAM HCL 2 MG/2ML IJ SOLN
INTRAMUSCULAR | Status: AC
  Administered 2016-04-30: 2 mg
  Filled 2016-04-30: qty 4

## 2016-04-30 MED ORDER — DEXTROSE 50 % IV SOLN
25.0000 mL | Freq: Once | INTRAVENOUS | Status: AC
  Administered 2016-04-30: 25 mL via INTRAVENOUS

## 2016-04-30 MED ORDER — ASPIRIN 81 MG PO CHEW: 324.0000 mg | CHEWABLE_TABLET | ORAL | Status: AC

## 2016-04-30 MED ORDER — SODIUM CHLORIDE 0.9 % IV SOLN: Freq: Once | INTRAVENOUS | Status: DC

## 2016-04-30 MED ORDER — HEPARIN SODIUM (PORCINE) 5000 UNIT/ML IJ SOLN
5000.0000 [IU] | Freq: Three times a day (TID) | INTRAMUSCULAR | Status: DC
  Administered 2016-04-30: 5000 [IU] via SUBCUTANEOUS
  Filled 2016-04-30 (×2): qty 1

## 2016-04-30 MED ORDER — DOXYCYCLINE HYCLATE 100 MG PO TABS
100.0000 mg | ORAL_TABLET | Freq: Two times a day (BID) | ORAL | Status: DC
  Administered 2016-04-30 – 2016-05-02 (×4): 100 mg via ORAL
  Filled 2016-04-30 (×4): qty 1

## 2016-04-30 MED ORDER — VANCOMYCIN HCL IN DEXTROSE 1-5 GM/200ML-% IV SOLN
1000.0000 mg | Freq: Once | INTRAVENOUS | Status: AC
  Administered 2016-04-30: 1000 mg via INTRAVENOUS
  Filled 2016-04-30: qty 200

## 2016-04-30 MED ORDER — FENTANYL CITRATE (PF) 100 MCG/2ML IJ SOLN
INTRAMUSCULAR | Status: AC
  Administered 2016-04-30: 50 ug via INTRAVENOUS
  Filled 2016-04-30: qty 4

## 2016-04-30 MED ORDER — DEXTROSE 5 % IV SOLN
1.0000 g | Freq: Three times a day (TID) | INTRAVENOUS | Status: DC
  Administered 2016-04-30 – 2016-05-02 (×5): 1 g via INTRAVENOUS
  Filled 2016-04-30 (×7): qty 1

## 2016-04-30 MED ORDER — ROCURONIUM BROMIDE 50 MG/5ML IV SOLN
25.0000 mg | Freq: Once | INTRAVENOUS | Status: AC
  Administered 2016-04-30: 25 mg via INTRAVENOUS

## 2016-04-30 MED ORDER — ONDANSETRON HCL 4 MG/2ML IJ SOLN
4.0000 mg | Freq: Four times a day (QID) | INTRAMUSCULAR | Status: DC | PRN
  Administered 2016-05-03 – 2016-05-13 (×5): 4 mg via INTRAVENOUS
  Filled 2016-04-30 (×5): qty 2

## 2016-04-30 NOTE — ED Notes (Signed)
Patient rectal temp 89.2, RN and EDP aware

## 2016-04-30 NOTE — ED Notes (Signed)
Pt became hypoxic and appears more lethargic.  [place]ati EDP made aware and was at bedside.  RT contacted and is at bedside to suction pt via NTS.

## 2016-04-30 NOTE — H&P (Signed)
PULMONARY / CRITICAL CARE MEDICINE   Name: STEN BASCH MRN: [id number] DOB: 26-Nov-1965    ADMISSION DATE:  04/30/2016 CONSULTATION DATE:  04/30/2016  REFERRING MD:  Dr. Kathrynn Humble   CHIEF COMPLAINT: Hypoxia, weakness, AMS   HISTORY OF PRESENT ILLNESS:   49 year old male with PMH of a traumatic brain injury with cognition difficulty whom lives in a group home, BPH, anxiety, and bipolar disorder;  presented 12/6 to ED from PCP with hypoxia, weakness and AMS. Per caretaker patient has not seemed the same since discharge on 12/1. Upon arrival to ED, patient rectal temp 89.2. CXR revealed progressed bilateral airspace disease with a right effusion. PAN cultures were sent and antibiotics started. Given 3L NS.  PCCM called to admit.    Recent admission from 11/28-12/1 for a week week history of increased weakness and productive cough. In the ED was found to be hypothermic, tachypnea, and hypotensive. Worked up for sepsis secondary to right lobar pneumonia - urine legionella / strep antigen negative. Treated with rocephin and azithromycin. Discharged with 7 day course of Levaquin. During admission was evaluated by speech where he was placed on a dysaphia level III diet with nectar thick liquid. He was also found to be anemic and work up was consistent with iron deficiency anemia.    PAST MEDICAL HISTORY :  He  has a past medical history of Anxiety; Bipolar disorder ([place]); BPH (benign prostatic hyperplasia); Closed head injury; Constipation; Depression; Esophageal ulcer; Malnutrition ([place]); Mental retardation, mild (I.Q. 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  PAST SURGICAL HISTORY: He  has a past surgical history that includes Gastrectomy and Esophagogastroduodenoscopy (06/17/2011).  Allergies  Allergen Reactions  . Pineapple Anaphylaxis    No current facility-administered medications on file prior to  encounter.    Current Outpatient Prescriptions on File Prior to Encounter  Medication Sig  . benztropine (COGENTIN) 0.5 MG tablet Take 0.5 mg by mouth daily.  . carbamazepine (CARBATROL) 300 MG 12 hr capsule TAKE 1 CAPSULE BY MOUTH 2 TIMES DAILY  . clonazePAM (KLONOPIN) 0.5 MG tablet Take 0.5 mg by mouth at bedtime. And 1 tablet as needed for anxiety  . escitalopram (LEXAPRO) 10 MG tablet Take 10 mg by mouth daily.  . LamoTRIgine XR 200 MG TB24 TAKE 2 TABLETS BY MOUTH AT BEDTIME  . levofloxacin (LEVAQUIN) 750 MG tablet Take 1 tablet (750 mg total) by mouth daily.  Marland Kitchen loratadine (CLARITIN) 10 MG tablet Take 10 mg by mouth daily.  . pantoprazole (PROTONIX) 40 MG tablet Take 40 mg by mouth daily.  . risperiDONE (RISPERDAL) 2 MG tablet Take 1 mg by mouth 2 (two) times daily.   . Tamsulosin HCl (FLOMAX) 0.4 MG CAPS Take 0.4 mg by mouth at bedtime.   . traZODone (DESYREL) 50 MG tablet Take 50 mg by mouth at bedtime.  . ferrous sulfate 325 (65 FE) MG tablet Take 1 tablet (325 mg total) by mouth 2 (two) times daily with a meal. (Patient not taking: Reported on 04/30/2016)    FAMILY HISTORY:  His indicated that his mother is alive. He indicated that his father is alive. He indicated that his sister is alive. He indicated that his brother is alive.    SOCIAL HISTORY: He  reports that he quit smoking about 5 years ago. He smoked 0.50 packs per day. He quit smokeless tobacco use about 14 years ago. He reports that he does not drink alcohol or  use drugs.  REVIEW OF SYSTEMS:  Unable to complete as patient is altered.     SUBJECTIVE:    VITAL SIGNS: BP (!) 102/47   Pulse 73   Temp (!) 90.4 F (32.4 C) (Rectal)   Resp (!) 30   Ht 6\' 2"  (1.88 m)   Wt 88 kg (194 lb)   SpO2 93%   BMI 24.91 kg/m   HEMODYNAMICS:    VENTILATOR SETTINGS:    INTAKE / OUTPUT: No intake/output data recorded.  PHYSICAL EXAMINATION: General:  Pale adult male lying on ER stretcher with Bair Hugger in place   Neuro:  Eyes open to voice, follows simple commands, attempts to cough on command, generalized weakness HEENT:  MM pink/dry, no jvd, old trach scar noted  Cardiovascular:  s1s2 difficult to hear due to breath sounds, SR on monitor  Lungs:  Tachypnea but non-labored, lungs bilaterally with coarse rhonchi Abdomen:  Obese/soft, bsx4 active  Musculoskeletal: no acute deformities  Skin:  Warm/dry, trace generalized edema, scattered abrasions on LE's    Sepsis - Repeat Assessment  Performed at:    1546  Vitals     Blood pressure 99/69, pulse 80, temperature (!) 90.4 F (32.4 C), temperature source Rectal, resp. rate (!) 28, height 6\' 2"  (1.88 m), weight 194 lb (88 kg), SpO2 99 %.  Heart:     Regular rate and rhythm  Lungs:    Rhonchi  Capillary Refill:   <2 sec  Peripheral Pulse:   Radial pulse palpable - faint but palpable   Skin:     Pale and Dry     LABS:  BMET  Recent Labs Lab 04/30/16 1217  NA 144  K 4.2  CL 106  CO2 30  BUN 12  CREATININE 0.89  GLUCOSE 91    Electrolytes  Recent Labs Lab 04/30/16 1217  CALCIUM 8.9    CBC  Recent Labs Lab 04/24/16 0844 04/30/16 1217  WBC 3.7* 6.1  HGB 7.4* 7.5*  HCT 23.8* 23.9*  PLT 101* 143*    Coag's  Recent Labs Lab 04/30/16 1217  INR 1.10    Sepsis Markers  Recent Labs Lab 04/30/16 1233  LATICACIDVEN 2.04*    ABG  Recent Labs Lab 04/30/16 1220  PHART 7.409  PCO2ART 47.0  PO2ART 150*    Liver Enzymes  Recent Labs Lab 04/30/16 1217  AST 28  ALT 29  ALKPHOS 117  BILITOT 0.4  ALBUMIN 2.7*    Cardiac Enzymes  Recent Labs Lab 04/30/16 1217  TROPONINI 0.12*    Glucose  Recent Labs Lab 04/23/16 2327 04/24/16 1136 04/24/16 1217 04/24/16 1441 04/24/16 2310 04/25/16 0722  GLUCAP 69 64* 84 72 92 61*    Imaging Dg Chest 2 View  Result Date: 04/30/2016 CLINICAL DATA:  Code sepsis.  Cough hypothermia EXAM: CHEST  2 VIEW COMPARISON:  04/22/2016 FINDINGS: Severe diffuse  bilateral airspace disease with marked progression since prior study. Airspace disease is bilateral but more prominent on the right. Air bronchograms bilaterally. Small right effusion. Chronic fracture deformity right humerus IMPRESSION: Severe diffuse bilateral airspace disease. Differential includes multifocal pneumonia versus pulmonary edema. Electronically Signed   By: Franchot Gallo M.D.   On: 04/30/2016 13:38     STUDIES:  CXR 12/6 >> Severe diffuse bilateral airspace disease. Small right effusion  ECHO 12/6 >>   CULTURES: Blood 12/6 >> Urine 12/6 >> RVP 12/6 >>  U. Strep 12/6 >>  U. Legionella 12/6 >>  RVP 12/6 >>  ANTIBIOTICS: Cefepime 12/6 >>  Vanco 12/6 >>  Doxycycline 12/6 >>   SIGNIFICANT EVENTS: 11/28-12/1 > Admission for CAP vs Aspiration PNA > D/C with 7 day course of Levaquin   LINES/TUBES: ETT 12/6 >>   DISCUSSION: 50 y/o M, group home resident, with PMH of mental delay & recent hospitalization for RLL PNA admitted 12/6 with diffuse infiltrates, hypothermia and concern for sepsis.   ASSESSMENT / PLAN:  PULMONARY A: Acute Hypoxic Respiratory Failure secondary to Diffuse Bilateral Infiltrates - ddx includes aspiration, pneumonitis, viral PNA, HCAP, alveolar hemorrhage (although no hx hemoptysis, low Hgb) Pa/FiO2 ratio compatible with moderate ARDS Hx Tracheostomy  P:   Now intubation  PRVC 8 cc/kg for now, may require low Vt ventilation  Aspiration precautions / NTS PRN  Trend CXR while intubated  Follow up ABG post intubation  See ID  Consider CT of chest once stabilized to evaluate parenchyma   CARDIOVASCULAR A:  Hypotension - resolving with IVF  Profound Hypothermia - admit rectal temp of 89 Troponin Leak - suspect in setting of demand ischemia  P:  Admit to ICU for hemodynamic monitoring  Trend troponin, BNP Neosynephrine as needed to support MAP >65.  Anticipate BP to improve with PRBC Assess TSH, cortisol  PRBC's as below with troponin  leak   RENAL A:   At Risk AKI Hx BPH  P:   Trend BMP / UOP  Replace electrolytes as needed  Avoid nephrotoxic agents NS @ 100 ml/hr Hold flomax  GASTROINTESTINAL A:   H/O dysphagia - previously on D3 diet with honey thick liquids  P:   NPO  Place OGT  Pepcid for SUP  Begin TF per Nutrition  FOBT   HEMATOLOGIC A:   Anemia - no evidence of acute bleeding, baseline Hgb appears to be 11-12.  Prior recent work up consistent with IDA, r/o hemolytic anemia Thrombocytopenia  P:  Trend CBC  Transfuse 1 unit PRBC's now with follow up H/H Heparin for DVT prophylaxis  Assess LDH, haptoglobin, peripheral smear   INFECTIOUS A:   Diffuse Bilateral Infiltrates HCAP vs Aspiration PNA Recent PNA with Hospitalization  P:   Abx as above  Follow bacterial / viral cultures  Trend PCT   ENDOCRINE A:   At Risk Hypoglycemia with NPO status  P:   Monitor glucose Q6 while NPO  May need to add dextrose to MIVF's   NEUROLOGIC A:   Acute Metabolic Encephalopathy  Hx Mild MR (IQ 50-70) Hx Seizures, prior closed head injury, Bipolar Disorder, Anxiety  P:   RASS goal: 0 to -1  Hold home benztropine, carbamazepine, clonazepam, lexapro, lamotrigine, risperdal, trazodone, ambien for the evening. PRN fentanyl / versed for sedation    FAMILY  - Updates: Attempted to call mother, no answer.  Will update group home.   - Inter-disciplinary family meet or Palliative Care meeting due by:  12/13  CC Time:  42 minutes   Noe Gens, NP-C [facility] Pgr: [phone] or if no answer [phone] 04/30/2016, 4:38 PM

## 2016-04-30 NOTE — ED Notes (Signed)
Fist set of blood cx obtained

## 2016-04-30 NOTE — Procedures (Signed)
Consent:  Procedure performed emergently with reportedly full CODE STATUS from emergency room physician.  Medications: 1. Versed 2 mg IV push 2. Etomidate 25 mg IV push 3. Rocuronium 25 mg IV push  Description of Procedure:  The patient was laying recumbent. Rapid sequence intubation was performed with administration of Versed and etomidate for sedation followed by rocuronium once sedated. Patient was intubated with a 7.5 endotracheal tube secured at 25 cm at the teeth utilizing a #4 Macintosh blade with video laryngoscopy. Repeat capnographic color change was appreciated and bilateral breath sounds were auscultated with symmetric chest rise. Intubation was difficult with visualization of only arytenoid cartilage and epiglottis.  Complications: None.  Disposition: Patient being admitted to intensive care unit in critical state. Post intubation portable CXR & ABG pending.

## 2016-04-30 NOTE — ED Notes (Signed)
Urine sample obtained by I&O cath performed by Luberta Mutter NT with this RN assisting.

## 2016-04-30 NOTE — Progress Notes (Signed)
Hypoglycemic Event  CBG: 67  Treatment: D50 IV 25 mL  Symptoms: None  Follow-up CBG: Time:2059   CBG Result:117  Possible Reasons for Event: Inadequate meal intake  Comments/MD notified: e-link called    Orrin Brigham

## 2016-04-30 NOTE — ED Notes (Signed)
Bed: WA02 Expected date:  Expected time:  Means of arrival:  Comments: EMS- SOB/Hypoxia

## 2016-04-30 NOTE — ED Notes (Signed)
Second set of blood cx obtained

## 2016-04-30 NOTE — ED Notes (Signed)
Patient is being transported to [facility] for intubation by critical care providers.

## 2016-04-30 NOTE — ED Notes (Signed)
Pt's caretaker [name] [phone]

## 2016-04-30 NOTE — ED Notes (Signed)
Gave report to Rachel, RN

## 2016-04-30 NOTE — Progress Notes (Addendum)
Pharmacy - Vancomycin  Assessment:    Patient known to pharmacy from previous Cefepime dosing for sepsis/HCAP (please see note from Gretta Arab, PharmD earlier today for full details). Adding vancomycin per pharmacy; first dose already given.  Plan:  Vancomycin 500 mg IV now (already received 1g; total load 1500 mg), then 1000 mg IV q8 hr; goal trough 15-20 mcg/mL  Measure vancomycin trough levels at steady state as indicated  Reuel Boom, PharmD, BCPS Pager: [phone] 04/30/2016, 4:49 PM

## 2016-04-30 NOTE — Progress Notes (Signed)
[place] Progress Note Patient Name: [name] DOB: 15-May-1966 MRN: [id number]   Date of Service  04/30/2016  HPI/Events of Note  Hg 6.4  eICU Interventions  Transfuse 1 unit     Intervention Category Major Interventions: Other:  YACOUB,WESAM 04/30/2016, 6:43 PM

## 2016-04-30 NOTE — ED Notes (Signed)
Patient transported to X-ray 

## 2016-04-30 NOTE — Progress Notes (Signed)
RN called E-link MD about inability to reach patient's mother to get consent to transfuse blood.  No one has been able to contact the patient's mother since he arrived. RN attempted to call group home but no one answered. MD told RN that we would wait to transfuse blood until we could contact the mother and get consent. Will continue to monitor.

## 2016-04-30 NOTE — ED Triage Notes (Signed)
Per EMS, pt transported from MD's office today d/t hypoxia, weakness and AMS.  Was dx with PNA last month the 29th.  Was admitted, caretaker reported that since he was d/c, he has not been the same.  Normally verbal, today he is non-verbal.  Per EMS, pt is lethargic but is verbally arousable.

## 2016-05-01 ENCOUNTER — Inpatient Hospital Stay (HOSPITAL_COMMUNITY): Payer: Medicare Other

## 2016-05-01 DIAGNOSIS — J80 Acute respiratory distress syndrome: Secondary | ICD-10-CM

## 2016-05-01 DIAGNOSIS — R918 Other nonspecific abnormal finding of lung field: Secondary | ICD-10-CM

## 2016-05-01 DIAGNOSIS — I959 Hypotension, unspecified: Secondary | ICD-10-CM

## 2016-05-01 DIAGNOSIS — J189 Pneumonia, unspecified organism: Secondary | ICD-10-CM

## 2016-05-01 DIAGNOSIS — Y95 Nosocomial condition: Secondary | ICD-10-CM

## 2016-05-01 LAB — PHOSPHORUS
PHOSPHORUS: 3.2 mg/dL (ref 2.5–4.6)
Phosphorus: 2.7 mg/dL (ref 2.5–4.6)

## 2016-05-01 LAB — CBC
HEMATOCRIT: 20.5 % — AB (ref 39.0–52.0)
HEMOGLOBIN: 6.5 g/dL — AB (ref 13.0–17.0)
MCH: 28.8 pg (ref 26.0–34.0)
MCHC: 31.7 g/dL (ref 30.0–36.0)
MCV: 90.7 fL (ref 78.0–100.0)
PLATELETS: 136 10*3/uL — AB (ref 150–400)
RBC: 2.26 MIL/uL — AB (ref 4.22–5.81)
RDW: 16.7 % — ABNORMAL HIGH (ref 11.5–15.5)
WBC: 4 10*3/uL (ref 4.0–10.5)

## 2016-05-01 LAB — BLOOD GAS, ARTERIAL
ACID-BASE EXCESS: 5.3 mmol/L — AB (ref 0.0–2.0)
Acid-Base Excess: 2.3 mmol/L — ABNORMAL HIGH (ref 0.0–2.0)
BICARBONATE: 28.7 mmol/L — AB (ref 20.0–28.0)
Bicarbonate: 28.6 mmol/L — ABNORMAL HIGH (ref 20.0–28.0)
DRAWN BY: 270211
Drawn by: 232811
FIO2: 100
FIO2: 40
LHR: 18 {breaths}/min
LHR: 18 {breaths}/min
MECHVT: 580 mL
O2 Saturation: 99.3 %
O2 Saturation: 96.1 %
PATIENT TEMPERATURE: 37.7
PCO2 ART: 39.5 mmHg (ref 32.0–48.0)
PEEP/CPAP: 8 cmH2O
PEEP/CPAP: 8 cmH2O
PO2 ART: 269 mmHg — AB (ref 83.0–108.0)
PO2 ART: 85.5 mmHg (ref 83.0–108.0)
Patient temperature: 90.4
VT: 580 mL
pCO2 arterial: 43.8 mmHg (ref 32.0–48.0)
pH, Arterial: 7.403 (ref 7.350–7.450)
pH, Arterial: 7.478 — ABNORMAL HIGH (ref 7.350–7.450)

## 2016-05-01 LAB — GLUCOSE, CAPILLARY
GLUCOSE-CAPILLARY: 69 mg/dL (ref 65–99)
GLUCOSE-CAPILLARY: 84 mg/dL (ref 65–99)
GLUCOSE-CAPILLARY: 87 mg/dL (ref 65–99)
GLUCOSE-CAPILLARY: 92 mg/dL (ref 65–99)
Glucose-Capillary: 71 mg/dL (ref 65–99)
Glucose-Capillary: 70 mg/dL (ref 65–99)
Glucose-Capillary: 95 mg/dL (ref 65–99)
Glucose-Capillary: 85 mg/dL (ref 65–99)

## 2016-05-01 LAB — ECHOCARDIOGRAM COMPLETE
Height: 74 in
Weight: 3350.99 oz

## 2016-05-01 LAB — BASIC METABOLIC PANEL
ANION GAP: 7 (ref 5–15)
BUN: 12 mg/dL (ref 6–20)
CHLORIDE: 109 mmol/L (ref 101–111)
CO2: 28 mmol/L (ref 22–32)
Calcium: 7.9 mg/dL — ABNORMAL LOW (ref 8.9–10.3)
Creatinine, Ser: 0.94 mg/dL (ref 0.61–1.24)
GFR calc Af Amer: >60 mL/min (ref >60)
GFR calc non Af Amer: >60 mL/min (ref >60)
GLUCOSE: 71 mg/dL (ref 65–99)
POTASSIUM: 3.9 mmol/L (ref 3.5–5.1)
Sodium: 144 mmol/L (ref 135–145)

## 2016-05-01 LAB — RESPIRATORY PANEL BY PCR
ADENOVIRUS-RVPPCR: NOT DETECTED
Bordetella pertussis: NOT DETECTED
CHLAMYDOPHILA PNEUMONIAE-RVPPCR: NOT DETECTED
CORONAVIRUS 229E-RVPPCR: NOT DETECTED
CORONAVIRUS HKU1-RVPPCR: NOT DETECTED
CORONAVIRUS NL63-RVPPCR: NOT DETECTED
CORONAVIRUS OC43-RVPPCR: NOT DETECTED
Influenza A: NOT DETECTED
Influenza B: NOT DETECTED
MYCOPLASMA PNEUMONIAE-RVPPCR: NOT DETECTED
Metapneumovirus: NOT DETECTED
PARAINFLUENZA VIRUS 1-RVPPCR: NOT DETECTED
PARAINFLUENZA VIRUS 4-RVPPCR: NOT DETECTED
Parainfluenza Virus 2: NOT DETECTED
Parainfluenza Virus 3: NOT DETECTED
Respiratory Syncytial Virus: NOT DETECTED
Rhinovirus / Enterovirus: NOT DETECTED

## 2016-05-01 LAB — URINE CULTURE: Culture: NO GROWTH

## 2016-05-01 LAB — HEMOGLOBIN AND HEMATOCRIT, BLOOD
HCT: 23.2 % — ABNORMAL LOW (ref 39.0–52.0)
HEMOGLOBIN: 7.5 g/dL — AB (ref 13.0–17.0)

## 2016-05-01 LAB — MAGNESIUM
Magnesium: 1.6 mg/dL — ABNORMAL LOW (ref 1.7–2.4)
Magnesium: 2.1 mg/dL (ref 1.7–2.4)

## 2016-05-01 LAB — PROCALCITONIN: Procalcitonin: 0.14 ng/mL

## 2016-05-01 LAB — HAPTOGLOBIN: Haptoglobin: 230 mg/dL — ABNORMAL HIGH (ref 34–200)

## 2016-05-01 MED ORDER — ESCITALOPRAM OXALATE 20 MG PO TABS
10.0000 mg | ORAL_TABLET | Freq: Every day | ORAL | Status: DC
  Administered 2016-05-01 – 2016-05-11 (×10): 10 mg via ORAL
  Filled 2016-05-01 (×10): qty 1

## 2016-05-01 MED ORDER — BENZTROPINE MESYLATE 0.5 MG PO TABS
0.5000 mg | ORAL_TABLET | Freq: Every day | ORAL | Status: DC
  Administered 2016-05-01 – 2016-05-11 (×10): 0.5 mg via ORAL
  Filled 2016-05-01 (×10): qty 1

## 2016-05-01 MED ORDER — LAMOTRIGINE 100 MG PO TABS
100.0000 mg | ORAL_TABLET | Freq: Two times a day (BID) | ORAL | Status: DC
  Administered 2016-05-01 – 2016-05-13 (×24): 100 mg
  Filled 2016-05-01 (×26): qty 1

## 2016-05-01 MED ORDER — FREE WATER
50.0000 mL | Freq: Four times a day (QID) | Status: DC
  Administered 2016-05-01 – 2016-05-02 (×3): 50 mL

## 2016-05-01 MED ORDER — SODIUM CHLORIDE 0.9% FLUSH
10.0000 mL | Freq: Two times a day (BID) | INTRAVENOUS | Status: DC
  Administered 2016-05-01 – 2016-05-14 (×16): 10 mL
  Administered 2016-05-15: 20 mL
  Administered 2016-05-17: 10 mL

## 2016-05-01 MED ORDER — CLONAZEPAM 0.5 MG PO TABS
0.5000 mg | ORAL_TABLET | Freq: Every day | ORAL | Status: DC
  Administered 2016-05-01 – 2016-05-11 (×11): 0.5 mg via ORAL
  Filled 2016-05-01 (×11): qty 1

## 2016-05-01 MED ORDER — PRO-STAT SUGAR FREE PO LIQD
30.0000 mL | Freq: Three times a day (TID) | ORAL | Status: DC
  Administered 2016-05-01 – 2016-05-04 (×9): 30 mL
  Filled 2016-05-01 (×8): qty 30

## 2016-05-01 MED ORDER — DEXTROSE 50 % IV SOLN
INTRAVENOUS | Status: AC
  Administered 2016-05-01: 25 mL
  Filled 2016-05-01: qty 50

## 2016-05-01 MED ORDER — DEXTROSE-NACL 5-0.9 % IV SOLN
INTRAVENOUS | Status: DC
  Administered 2016-05-01 (×2): via INTRAVENOUS

## 2016-05-01 MED ORDER — MIDAZOLAM HCL 2 MG/2ML IJ SOLN
1.0000 mg | Freq: Once | INTRAMUSCULAR | Status: AC
  Administered 2016-05-01: 1 mg via INTRAVENOUS

## 2016-05-01 MED ORDER — VITAL 1.5 CAL PO LIQD
1000.0000 mL | ORAL | Status: DC
  Administered 2016-05-01 – 2016-05-02 (×2): 1000 mL
  Filled 2016-05-01 (×5): qty 1000

## 2016-05-01 MED ORDER — SODIUM CHLORIDE 0.9% FLUSH
10.0000 mL | INTRAVENOUS | Status: DC | PRN
  Administered 2016-05-15: 10 mL
  Filled 2016-05-01: qty 40

## 2016-05-01 MED ORDER — MIDAZOLAM BOLUS VIA INFUSION: 1.0000 mg | Freq: Once | INTRAVENOUS | Status: DC

## 2016-05-01 MED ORDER — MAGNESIUM SULFATE 2 GM/50ML IV SOLN
2.0000 g | Freq: Once | INTRAVENOUS | Status: AC
  Administered 2016-05-01: 2 g via INTRAVENOUS
  Filled 2016-05-01: qty 50

## 2016-05-01 MED ORDER — CARBAMAZEPINE 100 MG/5ML PO SUSP
200.0000 mg | Freq: Three times a day (TID) | ORAL | Status: DC
  Administered 2016-05-01 – 2016-05-13 (×34): 200 mg
  Filled 2016-05-01 (×41): qty 10

## 2016-05-01 MED ORDER — RISPERIDONE 1 MG PO TABS
1.0000 mg | ORAL_TABLET | Freq: Two times a day (BID) | ORAL | Status: DC
  Administered 2016-05-01 – 2016-05-11 (×21): 1 mg via ORAL
  Filled 2016-05-01 (×21): qty 1

## 2016-05-01 NOTE — Progress Notes (Signed)
Peripherally Inserted Central Catheter/Midline Placement  The IV Nurse has discussed with the patient and/or persons authorized to consent for the patient, the purpose of this procedure and the potential benefits and risks involved with this procedure.  The benefits include less needle sticks, lab draws from the catheter, and the patient may be discharged home with the catheter. Risks include, but not limited to, infection, bleeding, blood clot (thrombus formation), and puncture of an artery; nerve damage and irregular heartbeat and possibility to perform a PICC exchange if needed/ordered by physician.  Alternatives to this procedure were also discussed.  Bard Power PICC patient education guide, fact sheet on infection prevention and patient information card has been provided to patient /or left at bedside.    PICC/Midline Placement Documentation     Telephone consent by Director of [facility], Rickey Primus   Christella Noa Albarece 05/01/2016, 6:11 PM

## 2016-05-01 NOTE — Progress Notes (Signed)
PULMONARY / CRITICAL CARE MEDICINE   Name: [name] MRN: [id number] DOB: [birth year]    ADMISSION DATE:  04/30/2016 CONSULTATION DATE:  04/30/2016  REFERRING MD:  Dr. Kathrynn Humble   CHIEF COMPLAINT: Hypoxia, weakness, AMS   BRIEF SUMMARY:  50 y/o M with PMH of MR (baseline lives at group home, IQ ~ 50-70) and prior tracheostomy, with recent admit for RLL PNA.  Patient treated and discharged on levaquin.  Found 12/6 altered, hypothermic, anemic with diffuse bilateral infiltrates.  Required intubation in ER.     SUBJECTIVE:  RN reports bloody secretions from ETT.  Pt did not receive blood overnight due to consent issues.  Hgb down to 6.4.  Neo weaned to 10 mcg's.    VITAL SIGNS: BP 112/60   Pulse 85   Temp 99.7 F (37.6 C)   Resp 18   Ht 6\' 2"  (1.88 m)   Wt 209 lb 7 oz (95 kg)   SpO2 90%   BMI 26.89 kg/m   HEMODYNAMICS:    VENTILATOR SETTINGS: Vent Mode: PRVC FiO2 (%):  [40 %-100 %] 60 % Set Rate:  [18 bmp] 18 bmp Vt Set:  [550 mL-580 mL] 580 mL PEEP:  [8 cmH20] 8 cmH20 Plateau Pressure:  [20 [id number] cmH20] 20 cmH20  INTAKE / OUTPUT: I/O last 3 completed shifts: In: [id number] [I.V.:2836; IV Piggyback:2950] Out: 1690 [Urine:1690]  PHYSICAL EXAMINATION: General:  Pale adult male lying in bed in NAD  Neuro:  Eyes open, follows simple commands, waves at provider on entry to room HEENT:  MM pink/dry, no jvd, old trach scar noted  Cardiovascular:  s1s2 difficult to hear due to breath sounds, SR on monitor  Lungs:  Tachypnea but non-labored, lungs bilaterally with coarse rhonchi Abdomen:  Obese/soft, bsx4 active  Musculoskeletal: no acute deformities  Skin:  Warm/dry, trace generalized edema, scattered abrasions on LE's    LABS:  BMET  Recent Labs Lab 04/30/16 1217 04/30/16 1653 05/01/16 0317  NA 144  --  144  K 4.2  --  3.9  CL 106  --  109  CO2 30  --  28  BUN 12  --  12  CREATININE 0.89 0.73 0.94  GLUCOSE 91  --  71    Electrolytes  Recent Labs Lab  04/30/16 1217 04/30/16 1653 05/01/16 0317  CALCIUM 8.9  --  7.9*  MG  --  1.8 1.6*  PHOS  --  3.4 2.7    CBC  Recent Labs Lab 04/30/16 1217 04/30/16 1653 05/01/16 0317  WBC 6.1 4.5 4.0  HGB 7.5* 6.4* 6.5*  HCT 23.9* 20.8* 20.5*  PLT 143* 121* 136*    Coag's  Recent Labs Lab 04/30/16 1217  INR 1.10    Sepsis Markers  Recent Labs Lab 04/30/16 1233 04/30/16 1653 04/30/16 1718 05/01/16 0317  LATICACIDVEN 2.04*  --  0.78  --   PROCALCITON  --  0.11  --  0.14    ABG  Recent Labs Lab 04/30/16 1220 04/30/16 1820 05/01/16 0340  PHART 7.409 7.403 7.478*  PCO2ART 47.0 43.8 39.5  PO2ART 150* 269* 85.5    Liver Enzymes  Recent Labs Lab 04/30/16 1217  AST 28  ALT 29  ALKPHOS 117  BILITOT 0.4  ALBUMIN 2.7*    Cardiac Enzymes  Recent Labs Lab 04/30/16 1217  TROPONINI 0.12*    Glucose  Recent Labs Lab 04/30/16 2002 04/30/16 2059 04/30/16 2303 05/01/16 0304 05/01/16 0614 05/01/16 0740  GLUCAP 67 117* 73 71 84 70  Imaging Dg Chest 2 View  Result Date: 04/30/2016 CLINICAL DATA:  Code sepsis.  Cough hypothermia EXAM: CHEST  2 VIEW COMPARISON:  04/22/2016 FINDINGS: Severe diffuse bilateral airspace disease with marked progression since prior study. Airspace disease is bilateral but more prominent on the right. Air bronchograms bilaterally. Small right effusion. Chronic fracture deformity right humerus IMPRESSION: Severe diffuse bilateral airspace disease. Differential includes multifocal pneumonia versus pulmonary edema. Electronically Signed   By: Franchot Gallo M.D.   On: 04/30/2016 13:38   Dg Chest Portable 1 View  Result Date: 04/30/2016 CLINICAL DATA:  Status post intubation EXAM: PORTABLE CHEST 1 VIEW COMPARISON:  04/30/2016 at 1325 hours FINDINGS: Preferential intubation of the right mainstem bronchus. Withdrawal approximately 4 cm is suggested. Multifocal patchy opacities, right upper lobe predominant, suspicious for multifocal  pneumonia. Interstitial edema is also possible but considered less likely. No definite pleural effusions. Cardiomegaly. IMPRESSION: Preferential intubation of the right mainstem bronchus. Withdrawal approximately 4 cm is suggested. Multifocal patchy opacities, right upper lobe predominant, suspicious for multifocal pneumonia. Electronically Signed   By: Julian Hy M.D.   On: 04/30/2016 17:04   Dg Abd Portable 1 View  Result Date: 04/30/2016 CLINICAL DATA:  OG tube placement. EXAM: PORTABLE ABDOMEN - 1 VIEW COMPARISON:  CT the abdomen and pelvis 12/10/2010 FINDINGS: OG tube is in place. Side port of the NG tube is in the stomach. The stomach is decompressed. There is a single distended loop of small bowel in the mid abdomen. Gas and stool are present throughout the colon. Levoconvex curvature is present in the lumbar spine. IMPRESSION: 1. The side port of the NG tube is within the stomach. 2. Single dilated loop of bowel in the mid abdomen may reflect focal ileus without evidence for obstruction. Electronically Signed   By: San Morelle M.D.   On: 04/30/2016 17:27     STUDIES:  CXR 12/6 >> Severe diffuse bilateral airspace disease. Small right effusion  ECHO 12/6 >>   CULTURES: Blood 12/6 >> Urine 12/6 >> RVP 12/6 >>  U. Strep 12/6 >>  U. Legionella 12/6 >>  RVP 12/6 >>   ANTIBIOTICS: Cefepime 12/6 >>  Vanco 12/6 >>  Doxycycline 12/6 >>   SIGNIFICANT EVENTS: 11/28-12/1 > Admission for CAP vs Aspiration PNA > D/C with 7 day course of Levaquin   LINES/TUBES: ETT 12/6 >>   DISCUSSION: 50 y/o M, group home resident, with PMH of mental delay & recent hospitalization for RLL PNA admitted 12/6 with diffuse infiltrates, hypothermia and concern for sepsis.   ASSESSMENT / PLAN:  PULMONARY A: Acute Hypoxic Respiratory Failure secondary to Diffuse Bilateral Infiltrates - ddx includes aspiration, pneumonitis, viral PNA, HCAP, alveolar hemorrhage (although no hx hemoptysis, low  Hgb) Pa/FiO2 ratio compatible with moderate ARDS Hx Tracheostomy  P:   PRVC 8 cc/kg   Daily SBT / WUA  Aspiration precautions   Plan for FOB with anemia & bloody secretions from ETT Trend CXR while intubated  See ID  Consider CT of chest to evaluate parenchyma   CARDIOVASCULAR A:  Hypotension - resolving with IVF  Profound Hypothermia - admit rectal temp of 89 Troponin Leak - suspect in setting of demand ischemia  P:   ICU hemodynamic monitoring  Trend troponin, BNP Neosynephrine as needed to support MAP >65.  Anticipate BP to improve with PRBC Assess TSH, cortisol  PRBC's as below with troponin leak   RENAL A:   At Risk AKI Hx BPH  P:   Trend BMP /  UOP  Replace electrolytes as needed  Avoid nephrotoxic agents D5NS @ 100 ml/hr Hold flomax  GASTROINTESTINAL A:   H/O dysphagia - previously on D3 diet with honey thick liquids  P:   NPO  Place OGT  Pepcid for SUP  Begin TF per Nutrition  F/u FOBT   HEMATOLOGIC A:   Anemia - no evidence of acute bleeding, baseline Hgb appears to be 11-12.  Prior recent work up consistent with IDA, r/o hemolytic anemia Thrombocytopenia  P:  Trend CBC  Transfuse 1 unit PRBC's now with follow up H/H Heparin for DVT prophylaxis  F/u LDH 189, haptoglobin 230, peripheral smear   INFECTIOUS A:   Diffuse Bilateral Infiltrates HCAP vs Aspiration PNA Recent PNA with Hospitalization  P:   Abx as above  Follow bacterial / viral cultures  Trend PCT   ENDOCRINE A:   At Risk Hypoglycemia with NPO status  P:   Monitor glucose Q6 while NPO  Dextrose to IVF's   NEUROLOGIC A:   Acute Metabolic Encephalopathy  Hx Mild MR (IQ 50-70) Hx Seizures, prior closed head injury, Bipolar Disorder, Anxiety  P:   RASS goal: 0 to -1  Resume home cogentin, klonopin, lexapro, risperdal, carbamazepine and lamotrigine Hold home trazodone, ambien  PRN fentanyl / versed for sedation   FAMILY  - Updates: Attempted to call mother, no answer.   Pt's caretaker [name] [phone] updated on status.  Group home - [facility] updated via phone 12/7.  Consent obtained for blood and FOB from [place] director of the group home.  She reports the mother prefers for them to help with medical care.  No answer from Mother via phone.    - Inter-disciplinary family meet or Palliative Care meeting due by:  12/13  CC Time:  67 minutes   Noe Gens, NP-C [facility] Pgr: [phone] or if no answer [phone] 05/01/2016, 9:37 AM   ATTENDING NOTE / ATTESTATION NOTE :   I have discussed the case with the resident/APP Noe Gens NP.   I agree with the resident/APP's  history, physical examination, assessment, and plans.    I have edited the above note and modified it according to our agreed history, physical examination, assessment and plan.   Briefly, patient admitted for worsening dyspnea. Patient has mental retardation is baseline. He lives at a group home. He was admitted recently for right lower lobe pneumonia. He was discharged on levofloxacin. He got some better. On day of admission, he was noted to be altered, hypothermic, also with bilateral infiltrates. He was intubated for hypoxemic respiratory failure. He had hemoptysis. Also, was found to be anemic requiring transfusion. He was started on Neo-Synephrine peripherally.  Overnight, no issues. Still requiring 60% FiO2. Some hemoptysis. Neo-Synephrine being weaned off. Patient seen, examined. Awake, comfortable, following commands. Vital signs stable. Good air entry. Crackles at the bases. Good S1 and S2. Belly was soft and benign. Warm extremities. Rest of exam per Brandi.  Assessment/Plan : 1. Acute hypoxemic hypercapnic respiratory failure secondary to healthcare associated pneumonia. Patient with hemoptysis so there is concern for pulmonary hemorrhage but less likely given significant improvement of chest x-ray the last 12-24 hours. Concern for  pulmonary edema as well. Concern for obesity hypoventilation syndrome/obstructive sleep apnea. Concern for aspiration pneumonia as well. - pt will need a diagnostic bronchoscopy. - Continue broad-spectrum antibiotics pending culture. De-escalate once with culture. - Continue Pulmicort twice a day and DuoNeb 4 times a day. - Cut  down FiO2 to keep O2 sats more than 88%.   2. Sepsis 2/2 above.  - Patient has been adequately resuscitated. Wean off Neo-Synephrine. Lactic acid reassuring. - Discontinue IV fluids once and to feeds.  3. Chronic Anemia. Likely iron deficiency anemia. - plan to transfuse to keep Hb > 7  - observe  4. Mental retardation. Seizure disorder. Anxiety disorder. - Resume home meds.  I have spent 30  minutes of critical care time with this patient today.  Family :Family updated at length today by Noe Gens.    Monica Becton, MD 05/01/2016, 10:27 AM [facility] Pager [phone] After 3 pm or if no answer, call [phone]

## 2016-05-01 NOTE — Care Management Note (Signed)
Case Management Note  Patient Details  Name: [name] MRN: [id number] Date of Birth: [birth year]  Subjective/Objective:         Pneumonia requiring vent.           Action/Plan: From a group home [facility] Date:  May 01, 2016 Chart reviewed for concurrent status and case management needs. Will continue to follow patient progress. Discharge Planning: following for needs Expected discharge date: [id number] Velva Harman, BSN, [place], [place]  Expected Discharge Date:   (UNKNOWN)               Expected Discharge Plan:  Group Home  In-House Referral:  Clinical Social Work  Discharge planning Services     Post Acute Care Choice:    Choice offered to:     DME Arranged:    DME Agency:     HH Arranged:    [place] Agency:     Status of Service:  In process, will continue to follow  If discussed at Long Length of Stay Meetings, dates discussed:    Additional Comments:  Leeroy Cha, RN 05/01/2016, 11:19 AM

## 2016-05-01 NOTE — Procedures (Signed)
Bronchoscopy Procedure Note Bruce GIOVINO [id number] 02-02-1966  Procedure: Bronchoscopy Indications: Diagnostic evaluation of the airways  Pt with hypoxemic resp failure 2/2 HCAP.   With hemoptysis this am.   Procedure Details Consent: Risks of procedure as well as the alternatives and risks of each were explained to the (patient/caregiver).  Consent for procedure obtained. Time Out: Verified patient identification, verified procedure, site/side was marked, verified correct patient position, special equipment/implants available, medications/allergies/relevent history reviewed, required imaging and test results available.  Performed  In preparation for procedure, patient was given 100% FiO2. Sedation: Benzodiazepines > patient received 5 mg Versed IV and Fentanyl 50 mcg IV >> both in divided doses  ET tube tip was 1 cm from the carina.  We readjusted ET tube from level 23 to 22.   Distal 1/3 of ETT and distal trachea with blood clots/erythematous mucosa, likely traumatic.  (-) endobronchial lesion seen. RUL was N.  Mildly erythematous mucosa in RML and RLL. Some purulent secretions in RML and RLL. Non bloody secretions.  LUL and lingula were N.  Mildly erythematous mucosa in the left lower lobe. Some purulent secretions in the left lower lobe. No endobronchial lesions seen.  Airway entered and the following bronchi were examined: RUL, RML, RLL, LUL, LLL and Bronchi.   Procedures performed: BAL done from RML. 60 mls of saline with more than 50% return obtained. Bronchoscope removed.    Evaluation Hemodynamic Status: BP stable throughout; O2 sats: stable throughout Patient's Current Condition: stable Specimens:  Sent serosanguinous fluid Complications: No apparent complications Patient did tolerate procedure well.   Hemoptysis likely related to trauma.   [place] 05/01/2016

## 2016-05-01 NOTE — Progress Notes (Signed)
CSW following to assist with d/c planning. Pt was recently hospitalized at [facility] from [place] on 04/22/16 and d/c on 12/1. Please see psychosocial assessment dated 04/23/16. Pt was hospitalized again on 04/30/16 form group home presenting with worsening bilateral opacities and worsening hypoxic respiratory failure and  septic shock. CSW has contacted [facility] [phone] from group home to assist with d/c planning. Pt can return to group home at d/c if he returns to his prior level of functioning. Pt presently requires vent support.CSW will continue to follow to assist with d/c planning needs.  Werner Lean LCSW [phone]

## 2016-05-01 NOTE — Progress Notes (Signed)
Hypoglycemic Event  CBG: 69  Treatment: D50 IV 25 mL  Symptoms: Shaky and None  Follow-up CBG: Time:1210 CBG Result:92  Possible Reasons for Event: Inadequate meal intake  Comments/MD notified:followed hypoglycemia protocol     [name]

## 2016-05-01 NOTE — Progress Notes (Signed)
eLink Physician-Brief Progress Note Patient Name: [name] DOB: June 29, 1965 MRN: [id number]   Date of Service  05/01/2016  HPI/Events of Note  Multiple issues: 1. Small amount of BRB when ETT suctioned. Hgb = 6.4. Not able to get consent to transfuse PRBC yet and 2. Blood glucose = 70 - NPO. No enteral nutrition yet.   eICU Interventions  Will order: 1. D/C Heparin [place]. 2. Place SCD's to bilateral LE's. 3. Change IV fluid to D5 0.9 NaCl at 100 mL/hour.     Intervention Category Intermediate Interventions: Other:  [name],[name] 05/01/2016, 3:12 AM

## 2016-05-01 NOTE — Progress Notes (Signed)
Initial Nutrition Assessment  DOCUMENTATION CODES:   Not applicable  INTERVENTION:  - Will order Vital 1.5 @ 50 mL/hr with 30 mL Prostat TID. This regimen will provide 2100 kcal, 126 grams of protein, and 917 mL free water. - Will order 50 mL free water QID (200 mL/day).  - RD will follow-up 12/8.  NUTRITION DIAGNOSIS:   Inadequate oral intake related to inability to eat as evidenced by NPO status.  GOAL:   Patient will meet greater than or equal to 90% of their needs  MONITOR:   TF tolerance, Vent status, Weight trends, Labs, Skin, I & O's  REASON FOR ASSESSMENT:   Ventilator, Consult Enteral/tube feeding initiation and management  ASSESSMENT:   50 year old male with PMH of a traumatic brain injury with cognition difficulty whom lives in a group home, BPH, anxiety, and bipolar disorder;  presented 12/6 to ED from PCP with hypoxia, weakness and AMS. Per caretaker patient has not seemed the same since discharge on 12/1. Upon arrival to ED, patient rectal temp 89.2. CXR revealed progressed bilateral airspace disease with a right effusion. PAN cultures were sent and antibiotics started.  Pt seen for new vent and TF consult. BMI indicates overweight status. PMH outlined above from H&P; no documented hx of DM. No family/visitors present at bedside at this time. Pt was intubated yesterday PM and OGT placed at that time. Per chart review, weight +7 kg since 11/29 so will use weight from that time (88 kg) to estimate nutrition needs. Will continue to monitor weight trends closely and will adjust as needed. Unable to perform physical assessment at this time as tech in room to perform EKG. Will complete physical assessment and document findings at that time. RN assessment indicates pt with mild to moderate edema throughout.   Dr. Corrie Dandy reports possible bronchoscopy later today. RN reports plan for TF to start this evening. H&P indicates pt with hx of dysphagia and was previous on a  dysphagia 3 diet with honey-thick liquids.   Patient is currently intubated on ventilator support MV: 9.8 L/min Temp (24hrs), Avg:98.4 F (36.9 C), Min:90.4 F (32.4 C), Max:99.9 F (37.7 C) Propofol: none BP: 99/77 and MAP: 83   Medications reviewed; 20 mg IV Pepcid BID, PRN Zofran.  Labs reviewed; Ca: 7.9 mg/dL, Mg: 1.6 mg/dL.  IVF: D5-1/2 NS @ 100 mL/hr (306 kcal).  Drip: Neo @ 10 mcg/min.   Diet Order:  Diet NPO time specified  Skin:  Wound (see comment) (Stage 1 R ankle pressure injury)  Last BM:  PTA/unknown  Height:   Ht Readings from Last 1 Encounters:  04/30/16 6\' 2"  (1.88 m)    Weight:   Wt Readings from Last 1 Encounters:  05/01/16 209 lb 7 oz (95 kg)    Ideal Body Weight:  86.36 kg  BMI:  Body mass index is 26.89 kg/m.  Estimated Nutritional Needs:   Kcal:  2138  Protein:  114-132 grams (1.3-1.5 grams/kg)  Fluid:  1.8-2 L/day  EDUCATION NEEDS:   No education needs identified at this time    Jarome Matin, MS, RD, LDN, CNSC Inpatient Clinical Dietitian Pager # [phone] After hours/weekend pager # [phone]

## 2016-05-01 NOTE — ED Provider Notes (Addendum)
[facility] DEPT Provider Note   CSN: [id number] Arrival date & time: 04/30/16  1128     History   Chief Complaint Chief Complaint  Patient presents with  . Pneumonia    HPI [name] is a 50 y.o. male.  HPI LEVEL 5 CAVEAT FOR ALTERED MENTAL STATUS  50 year old male with PMH of a traumatic brain injury with cognition difficulty who lives in a group home, bipolar disorder; recent admission for CAP presents from PCP with hypoxia, weakness and AMS. Per caretaker patient has not seemed the same since discharge on 12/1, and overtime he has gotten more weak and less responsive.  Spoke with mother - pt is full code. Caretaker reports that pt's pitting edema is new. There is no hx of CHF.   Past Medical History:  Diagnosis Date  . Anxiety   . Bipolar disorder ([place])   . BPH (benign prostatic hyperplasia)   . Closed head injury   . Constipation   . Depression   . Esophageal ulcer   . Malnutrition ([place])   . 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 Peptic ulcer disease   . Seizures ([place])   . Smoker     Patient Active Problem List   Diagnosis Date Noted  . HAP (hospital-acquired pneumonia)   . Bilateral pulmonary infiltrates on chest x-ray   . Arterial hypotension   . HCAP (healthcare-associated pneumonia)   . Acute respiratory failure with hypoxia ([place]) 04/30/2016  . Anemia, iron deficiency 04/25/2016  . Altered mental status   . Aspiration pneumonia of right lower lobe due to gastric secretions ([place])   . Sepsis ([place]) 04/23/2016  . Pressure injury of skin 04/23/2016  . Community acquired pneumonia of right lower lobe of lung ([place]) 04/22/2016  . Absolute anemia 02/22/2015  . Closed head injury 02/22/2015  . 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 Anxiety state 02/22/2015  . 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 Depression 03/28/2007  . 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 ESOPHAGOGASTRODUODENOSCOPY  06/17/2011   Procedure: ESOPHAGOGASTRODUODENOSCOPY (EGD);  Surgeon: Wonda Horner, MD;  Location: [facility];  Service: Endoscopy;  Laterality: N/A;  . GASTRECTOMY         Home Medications    Prior to Admission medications   Medication Sig Start Date End Date Taking? Authorizing Provider  benztropine (COGENTIN) 0.5 MG tablet Take 0.5 mg by mouth daily.   Yes Historical Provider, MD  carbamazepine (CARBATROL) 300 MG 12 hr capsule TAKE 1 CAPSULE BY MOUTH 2 TIMES DAILY 08/27/15  Yes Kathrynn Ducking, MD  clonazePAM (KLONOPIN) 0.5 MG tablet Take 0.5 mg by mouth at bedtime. And 1 tablet as needed for anxiety   Yes Historical Provider, MD  escitalopram (LEXAPRO) 10 MG tablet Take 10 mg by mouth daily.   Yes Historical Provider, MD  LamoTRIgine XR 200 MG TB24 TAKE 2 TABLETS BY MOUTH AT BEDTIME 11/19/15  Yes Kathrynn Ducking, MD  loratadine (CLARITIN) 10 MG tablet Take 10 mg by mouth daily.   Yes Historical Provider, MD  pantoprazole (PROTONIX) 40 MG tablet Take 40 mg by mouth daily.   Yes Historical Provider, MD  risperiDONE (RISPERDAL) 2 MG tablet Take 1 mg by mouth 2 (two) times daily.    Yes Historical Provider, MD  Tamsulosin HCl (FLOMAX) 0.4 MG CAPS Take 0.4 mg by mouth  at bedtime.    Yes Historical Provider, MD  traZODone (DESYREL) 50 MG tablet Take 50 mg by mouth at bedtime.   Yes Historical Provider, MD  zolpidem (AMBIEN) 10 MG tablet Take 10 mg by mouth at bedtime as needed for sleep.   Yes Historical Provider, MD  ferrous sulfate 325 (65 FE) MG tablet Take 1 tablet (325 mg total) by mouth 2 (two) times daily with a meal. Patient not taking: Reported on 04/30/2016 04/25/16   Louellen Molder, MD    Family History Family History  Problem Relation Age of Onset  . Family history unknown: Yes    Social History Social History  Substance Use Topics  . Smoking status: Former Smoker     Packs/day: 0.50    Quit date: 05/26/2010  . Smokeless tobacco: Former Systems developer    Quit date: 06/08/2001  . Alcohol use No     Allergies   Pineapple   Review of Systems Review of Systems  Unable to perform ROS: Mental status change     Physical Exam Updated Vital Signs BP 115/60   Pulse 79   Temp 99.7 F (37.6 C)   Resp 18   Ht _0  (1.88 m)   Wt 209 lb 7 oz (95 kg)   SpO2 95%   BMI 26.89 kg/m   Physical Exam  Constitutional: He is oriented to person, place, and time. He appears well-developed.  HENT:  Head: Atraumatic.  Neck: Neck supple.  Cardiovascular: Normal rate.   Pulmonary/Chest: He is in respiratory distress. He has wheezes.  Shallow respirations  Musculoskeletal: He exhibits edema.  Neurological: He is alert and oriented to person, place, and time.  Skin: Skin is warm.  Nursing note and vitals reviewed.    ED Treatments / Results  Labs (all labs ordered are listed, but only abnormal results are displayed) Labs Reviewed  COMPREHENSIVE METABOLIC PANEL - Abnormal; Notable for the following:       Result Value   Total Protein 6.0 (*)    Albumin 2.7 (*)    All other components within normal limits  CBC WITH DIFFERENTIAL/PLATELET - Abnormal; Notable for the following:    RBC 2.60 (*)    Hemoglobin 7.5 (*)    HCT 23.9 (*)    RDW 16.5 (*)    Platelets 143 (*)    Lymphs Abs 0.6 (*)    All other components within normal limits  BLOOD GAS, ARTERIAL - Abnormal; Notable for the following:    pO2, Arterial 150 (*)    Bicarbonate 31.4 (*)    Acid-Base Excess 4.9 (*)    All other components within normal limits  TROPONIN I - Abnormal; Notable for the following:    Troponin I 0.12 (*)    All other components within normal limits  BRAIN NATRIURETIC PEPTIDE - Abnormal; Notable for the following:    B Natriuretic Peptide 184.3 (*)    All other components within normal limits  HAPTOGLOBIN - Abnormal; Notable for the following:    Haptoglobin 230 (*)    All  other components within normal limits  CBC - Abnormal; Notable for the following:    RBC 2.27 (*)    Hemoglobin 6.4 (*)    HCT 20.8 (*)    RDW 16.6 (*)    Platelets 121 (*)    All other components within normal limits  BLOOD GAS, ARTERIAL - Abnormal; Notable for the following:    pO2, Arterial 269 (*)    Bicarbonate 28.6 (*)  Acid-Base Excess 2.3 (*)    All other components within normal limits  CBC - Abnormal; Notable for the following:    RBC 2.26 (*)    Hemoglobin 6.5 (*)    HCT 20.5 (*)    RDW 16.7 (*)    Platelets 136 (*)    All other components within normal limits  BASIC METABOLIC PANEL - Abnormal; Notable for the following:    Calcium 7.9 (*)    All other components within normal limits  MAGNESIUM - Abnormal; Notable for the following:    Magnesium 1.6 (*)    All other components within normal limits  BLOOD GAS, ARTERIAL - Abnormal; Notable for the following:    pH, Arterial 7.478 (*)    Bicarbonate 28.7 (*)    Acid-Base Excess 5.3 (*)    All other components within normal limits  GLUCOSE, CAPILLARY - Abnormal; Notable for the following:    Glucose-Capillary 117 (*)    All other components within normal limits  I-STAT CG4 LACTIC ACID, ED - Abnormal; Notable for the following:    Lactic Acid, Venous 2.04 (*)    All other components within normal limits  CULTURE, BLOOD (ROUTINE X 2)  CULTURE, BLOOD (ROUTINE X 2)  URINE CULTURE  RESPIRATORY PANEL BY PCR  CULTURE, RESPIRATORY (NON-EXPECTORATED)  FUNGUS CULTURE WITH STAIN  RESPIRATORY VIRUS PANEL  URINALYSIS, ROUTINE W REFLEX MICROSCOPIC  PROTIME-INR  INFLUENZA PANEL BY PCR (TYPE A & B, H1N1)  STREP PNEUMONIAE URINARY ANTIGEN  TSH  CORTISOL  LACTATE DEHYDROGENASE  SAVE SMEAR  CREATININE, SERUM  MAGNESIUM  PHOSPHORUS  PROCALCITONIN  PHOSPHORUS  PROCALCITONIN  GLUCOSE, CAPILLARY  GLUCOSE, CAPILLARY  GLUCOSE, CAPILLARY  GLUCOSE, CAPILLARY  GLUCOSE, CAPILLARY  GLUCOSE, CAPILLARY  GLUCOSE, CAPILLARY    GLUCOSE, CAPILLARY  LEGIONELLA PNEUMOPHILA SEROGP 1 UR AG  OCCULT BLOOD X 1 CARD TO LAB, STOOL  MAGNESIUM  PHOSPHORUS  HEMOGLOBIN AND HEMATOCRIT, BLOOD  I-STAT CG4 LACTIC ACID, ED  TYPE AND SCREEN  PREPARE RBC (CROSSMATCH)    EKG  EKG Interpretation  Date/Time:  Wednesday April 30 2016 12:13:34 EST Ventricular Rate:  66 PR Interval:    QRS Duration: 138 QT Interval:  463 QTC Calculation: 486 R Axis:   47 Text Interpretation:  Sinus rhythm Prolonged PR interval Right bundle branch block No acute changes Confirmed by Kathrynn Humble, MD, Thelma Comp [phone]) on 04/30/2016 1:09:06 PM       Radiology Dg Chest 2 View  Result Date: 04/30/2016 CLINICAL DATA:  Code sepsis.  Cough hypothermia EXAM: CHEST  2 VIEW COMPARISON:  04/22/2016 FINDINGS: Severe diffuse bilateral airspace disease with marked progression since prior study. Airspace disease is bilateral but more prominent on the right. Air bronchograms bilaterally. Small right effusion. Chronic fracture deformity right humerus IMPRESSION: Severe diffuse bilateral airspace disease. Differential includes multifocal pneumonia versus pulmonary edema. Electronically Signed   By: Franchot Gallo M.D.   On: 04/30/2016 13:38   Dg Chest Port 1 View  Result Date: 05/01/2016 CLINICAL DATA:  Acute respiratory failure with hypoxia, hospital acquired pneumonia EXAM: PORTABLE CHEST 1 VIEW COMPARISON:  Chest x-ray of April 30, 2016 FINDINGS: The endotracheal tube has been withdrawn and its tip now lies 3.3 cm above the carina. The lungs appear better aerated. There has been interval decrease in the conspicuity of alveolar opacities throughout both lungs. There is persistent alveolar density in the right upper lobe. The interstitial markings remain increased in the mid and lower right lung and in the left lung. The heart  is normal in size. The pulmonary vascularity is not clearly engorged. The esophagogastric tube tip projects below the inferior margin of the  image. IMPRESSION: Improved appearance of both lungs. Decreased alveolar opacities consistent with improving pneumonia. Successful repositioning of the endotracheal tube. Electronically Signed   By: Makel  [place] M.D.   On: 05/01/2016 10:13   Dg Chest Portable 1 View  Result Date: 04/30/2016 CLINICAL DATA:  Status post intubation EXAM: PORTABLE CHEST 1 VIEW COMPARISON:  04/30/2016 at 1325 hours FINDINGS: Preferential intubation of the right mainstem bronchus. Withdrawal approximately 4 cm is suggested. Multifocal patchy opacities, right upper lobe predominant, suspicious for multifocal pneumonia. Interstitial edema is also possible but considered less likely. No definite pleural effusions. Cardiomegaly. IMPRESSION: Preferential intubation of the right mainstem bronchus. Withdrawal approximately 4 cm is suggested. Multifocal patchy opacities, right upper lobe predominant, suspicious for multifocal pneumonia. Electronically Signed   By: Julian Hy M.D.   On: 04/30/2016 17:04   Dg Abd Portable 1 View  Result Date: 04/30/2016 CLINICAL DATA:  OG tube placement. EXAM: PORTABLE ABDOMEN - 1 VIEW COMPARISON:  CT the abdomen and pelvis 12/10/2010 FINDINGS: OG tube is in place. Side port of the NG tube is in the stomach. The stomach is decompressed. There is a single distended loop of small bowel in the mid abdomen. Gas and stool are present throughout the colon. Levoconvex curvature is present in the lumbar spine. IMPRESSION: 1. The side port of the NG tube is within the stomach. 2. Single dilated loop of bowel in the mid abdomen may reflect focal ileus without evidence for obstruction. Electronically Signed   By: San Morelle M.D.   On: 04/30/2016 17:27    Procedures .Critical Care Performed by: Varney Biles Authorized by: Varney Biles   Critical care provider statement:    Critical care time (minutes):  41   Critical care was necessary to treat or prevent imminent or life-threatening  deterioration of the following conditions:  Circulatory failure, respiratory failure, sepsis and shock   Critical care was time spent personally by me on the following activities:  Blood draw for specimens, development of treatment plan with patient or surrogate, evaluation of patient's response to treatment, examination of patient, gastric intubation, interpretation of cardiac output measurements, obtaining history from patient or surrogate, ordering and performing treatments and interventions, ordering and review of radiographic studies, ordering and review of laboratory studies, pulse oximetry, re-evaluation of patient's condition, review of old charts and ventilator management   I assumed direction of critical care for this patient from another provider in my specialty: yes     (including critical care time)  Medications Ordered in ED Medications  ceFEPIme (MAXIPIME) 1 g in dextrose 5 % 50 mL IVPB (1 g Intravenous Given 05/01/16 1538)  phenylephrine (NEO-SYNEPHRINE) 10 mg in dextrose 5 % 250 mL (0.04 mg/mL) infusion (10 mcg/min Intravenous Rate/Dose Change 05/01/16 1613)  0.9 %  sodium chloride infusion (not administered)  aspirin chewable tablet 324 mg (not administered)    Or  aspirin suppository 300 mg (not administered)  famotidine (PEPCID) tablet 20 mg (20 mg Oral Given 05/01/16 1224)  acetaminophen (TYLENOL) tablet 650 mg (not administered)  ondansetron (ZOFRAN) injection 4 mg (not administered)  albuterol (PROVENTIL) (2.5 MG/3ML) 0.083% nebulizer solution 2.5 mg (not administered)  0.9 %  sodium chloride infusion (not administered)  midazolam (VERSED) injection 1-4 mg (2 mg Intravenous Given 05/01/16 1555)  fentaNYL (SUBLIMAZE) injection 25-50 mcg (50 mcg Intravenous Given 05/01/16 1512)  vancomycin (VANCOCIN) 500  mg in sodium chloride 0.9 % 100 mL IVPB (not administered)    Followed by  vancomycin (VANCOCIN) IVPB 1000 mg/200 mL premix (1,000 mg Intravenous Given 05/01/16 1539)    doxycycline (VIBRA-TABS) tablet 100 mg (100 mg Oral Given 05/01/16 1224)  0.9 %  sodium chloride infusion (not administered)  chlorhexidine gluconate (MEDLINE KIT) (PERIDEX) 0.12 % solution 15 mL (15 mLs Mouth Rinse Given 05/01/16 0730)  MEDLINE mouth rinse (15 mLs Mouth Rinse Given 05/01/16 1545)  dextrose 5 %-0.9 % sodium chloride infusion ( Intravenous New Bag/Given 05/01/16 0322)  feeding supplement (PRO-STAT SUGAR FREE 64) liquid 30 mL (30 mLs Per Tube Given 05/01/16 1539)  feeding supplement (VITAL 1.5 CAL) liquid 1,000 mL (not administered)  free water 50 mL (not administered)  benztropine (COGENTIN) tablet 0.5 mg (0.5 mg Oral Given 05/01/16 1227)  clonazePAM (KLONOPIN) tablet 0.5 mg (not administered)  escitalopram (LEXAPRO) tablet 10 mg (10 mg Oral Given 05/01/16 1225)  risperiDONE (RISPERDAL) tablet 1 mg (1 mg Oral Given 05/01/16 1224)  carBAMazepine (TEGRETOL) 100 MG/5ML suspension 200 mg (200 mg Per Tube Given 05/01/16 1555)  lamoTRIgine (LAMICTAL) tablet 100 mg (100 mg Per Tube Given 05/01/16 1227)  sodium chloride 0.9 % bolus 1,000 mL (0 mLs Intravenous Stopped 04/30/16 1340)    And  sodium chloride 0.9 % bolus 1,000 mL (0 mLs Intravenous Stopped 04/30/16 1440)    And  sodium chloride 0.9 % bolus 1,000 mL (0 mLs Intravenous Stopped 04/30/16 1441)  ceFEPIme (MAXIPIME) 2 g in dextrose 5 % 50 mL IVPB (0 g Intravenous Stopped 04/30/16 1342)  vancomycin (VANCOCIN) IVPB 1000 mg/200 mL premix (0 mg Intravenous Stopped 04/30/16 1441)  midazolam (VERSED) 2 MG/2ML injection (2 mg  Given 04/30/16 1644)  etomidate (AMIDATE) injection 25 mg (25 mg Intravenous Given 04/30/16 1645)  rocuronium (ZEMURON) injection 25 mg (25 mg Intravenous Given 04/30/16 1646)  dextrose 50 % solution (  Duplicate 27/7/82 4235)  dextrose 50 % solution 25 mL (25 mLs Intravenous Given 04/30/16 2039)  magnesium sulfate IVPB 2 g 50 mL (2 g Intravenous Given 05/01/16 0955)  midazolam (VERSED) injection 1 mg (1 mg Intravenous Given  05/01/16 1116)  dextrose 50 % solution (25 mLs  Given 05/01/16 1146)     Initial Impression / Assessment and Plan / ED Course  I have reviewed the triage vital signs and the nursing notes.  Pertinent labs & imaging results that were available during my care of the patient were reviewed by me and considered in my medical decision making (see chart for details).  Clinical Course     Pt comes in with tachypnea, hypoxia, hypothermia, hypoglycemia. Recent admit for CAP - and lung exam is abnormal. + pitting edema.  DDX: Cardiogenic shock with new CHF Septic shock with hypothermia  Initial lab results came around 12:30 - pt has slightly elevated troponin, BNP and lactate is 2.04. Pt had received 2 liters of 2.3 liters of ivf in the 1st 2 hours. Vanc and Cefepime given. It seems that pt has multi-focal CAP and pulm edema. CCM consulted as pt needing more O2 and BP have stayed low.  Fluids slowed at 2.3 liters as in my judgement fluid is more detrimental in this acute phase unless airway is established. This discussion was made with the family, who have the POA. They have given [place] clearance for intubation if needed.  CCM to admit - 3:30 pm  SEPSIS REASSESSMENT COMPLETED BY ME AT 4:50 PM Pt is s/p intubation. BP have been  labile and bounced around the 80-100SBP range. Repeat lactate is pending. Temp has gone up a little. Deferring further care to CCM.    Final Clinical Impressions(s) / ED Diagnoses   Final diagnoses:  HAP (hospital-acquired pneumonia)  Acute respiratory failure with hypoxia [facility])    New Prescriptions Current Discharge Medication List       Varney Biles, MD 05/01/16 [place], MD 05/01/16 1711

## 2016-05-01 NOTE — Progress Notes (Signed)
Notified E-Link og HGB 7.5. No order to treat . CBC ordered for in the morning.   [name]

## 2016-05-01 NOTE — Progress Notes (Signed)
  Echocardiogram 2D Echocardiogram has been performed.  [name] 05/01/2016, 9:50 AM

## 2016-05-01 NOTE — Progress Notes (Signed)
Pharmacy Antibiotic Note  [name] is a 50 y.o. male admitted on 04/30/2016 with pneumonia.  Pharmacy has been consulted for vancomycin and cefepime dosing. PCCM added doxycycline for atypical coverage.  Patient is currently on vent.  Bronchoscopy completed 12/7  Day #2 antibiotics  Today, 05/01/2016:  Renal: Scr WNL  WBC WNL  Cultures unrevealing to date, some still pending  afebrile  Plan:  Vancomycin 1gm IV q8h  check trough in am  Cefepime 1gm IV q8h  Height: 6\' 2"  (188 cm) Weight: 209 lb 7 oz (95 kg) IBW/kg (Calculated) : 82.2  Temp (24hrs), Avg:98.5 F (36.9 C), Min:90.4 F (32.4 C), Max:99.9 F (37.7 C)   Recent Labs Lab 04/30/16 1217 04/30/16 1233 04/30/16 1653 04/30/16 1718 05/01/16 0317  WBC 6.1  --  4.5  --  4.0  CREATININE 0.89  --  0.73  --  0.94  LATICACIDVEN  --  2.04*  --  0.78  --     Estimated Creatinine Clearance: 111.7 mL/min (by C-G formula based on SCr of 0.94 mg/dL).    Allergies  Allergen Reactions  . Pineapple Anaphylaxis    Antimicrobials this admission:  12/6 Cefepime >>  12/6 Vancomycin >>  12/6 doxycyline (MD)  Dose adjustments this admission:  12/8 0530 VT = __mcg/ml on 1gm IV q8h (prior to 6th dose)  Microbiology results:  12/6 BCx: sent 12/6 UCx: sent  12/6 Resp panel: pending 11/29 MRSA PCR: neg Thank you for allowing pharmacy to be a part of this patient's care.  Doreene Eland, PharmD, BCPS.   Pager: [phone] 05/01/2016 11:59 AM

## 2016-05-02 ENCOUNTER — Inpatient Hospital Stay (HOSPITAL_COMMUNITY): Payer: Medicare Other

## 2016-05-02 LAB — COMPREHENSIVE METABOLIC PANEL
ALBUMIN: 2.2 g/dL — AB (ref 3.5–5.0)
ALT: 44 U/L (ref 17–63)
AST: 76 U/L — AB (ref 15–41)
Alkaline Phosphatase: 190 U/L — ABNORMAL HIGH (ref 38–126)
Anion gap: 5 (ref 5–15)
BILIRUBIN TOTAL: 0.5 mg/dL (ref 0.3–1.2)
BUN: 16 mg/dL (ref 6–20)
CHLORIDE: 107 mmol/L (ref 101–111)
CO2: 30 mmol/L (ref 22–32)
Calcium: 8.1 mg/dL — ABNORMAL LOW (ref 8.9–10.3)
Creatinine, Ser: 0.98 mg/dL (ref 0.61–1.24)
GFR calc Af Amer: >60 mL/min (ref >60)
GFR calc non Af Amer: >60 mL/min (ref >60)
GLUCOSE: 96 mg/dL (ref 65–99)
POTASSIUM: 3.3 mmol/L — AB (ref 3.5–5.1)
SODIUM: 142 mmol/L (ref 135–145)
TOTAL PROTEIN: 5.6 g/dL — AB (ref 6.5–8.1)

## 2016-05-02 LAB — GLUCOSE, CAPILLARY
GLUCOSE-CAPILLARY: 94 mg/dL (ref 65–99)
GLUCOSE-CAPILLARY: 115 mg/dL — AB (ref 65–99)
GLUCOSE-CAPILLARY: 129 mg/dL — AB (ref 65–99)
GLUCOSE-CAPILLARY: 118 mg/dL — AB (ref 65–99)
Glucose-Capillary: 117 mg/dL — ABNORMAL HIGH (ref 65–99)
Glucose-Capillary: 96 mg/dL (ref 65–99)

## 2016-05-02 LAB — CBC
HEMATOCRIT: 24.4 % — AB (ref 39.0–52.0)
HEMOGLOBIN: 7.8 g/dL — AB (ref 13.0–17.0)
MCH: 28.6 pg (ref 26.0–34.0)
MCHC: 32 g/dL (ref 30.0–36.0)
MCV: 89.4 fL (ref 78.0–100.0)
Platelets: 111 10*3/uL — ABNORMAL LOW (ref 150–400)
RBC: 2.73 MIL/uL — ABNORMAL LOW (ref 4.22–5.81)
RDW: 18 % — AB (ref 11.5–15.5)
WBC: 5.7 10*3/uL (ref 4.0–10.5)

## 2016-05-02 LAB — PHOSPHORUS: PHOSPHORUS: 3.3 mg/dL (ref 2.5–4.6)

## 2016-05-02 LAB — MAGNESIUM: Magnesium: 2.1 mg/dL (ref 1.7–2.4)

## 2016-05-02 LAB — TYPE AND SCREEN
Blood Product Expiration Date: 201712232359
ISSUE DATE / TIME: 201712071142
Unit Type and Rh: 5100

## 2016-05-02 LAB — OCCULT BLOOD X 1 CARD TO LAB, STOOL: Fecal Occult Bld: NEGATIVE

## 2016-05-02 LAB — LEGIONELLA PNEUMOPHILA SEROGP 1 UR AG: L. pneumophila Serogp 1 Ur Ag: NEGATIVE

## 2016-05-02 LAB — PROCALCITONIN: Procalcitonin: 0.14 ng/mL

## 2016-05-02 LAB — VANCOMYCIN, TROUGH: VANCOMYCIN TR: 28 ug/mL — AB (ref 15–20)

## 2016-05-02 MED ORDER — SODIUM CHLORIDE 0.9 % IV SOLN
3.0000 g | Freq: Four times a day (QID) | INTRAVENOUS | Status: DC
  Administered 2016-05-02 – 2016-05-05 (×12): 3 g via INTRAVENOUS
  Filled 2016-05-02 (×13): qty 3

## 2016-05-02 MED ORDER — PHENYLEPHRINE HCL 10 MG/ML IJ SOLN
0.0000 ug/min | INTRAVENOUS | Status: DC
  Administered 2016-05-02: 20 ug/min via INTRAVENOUS
  Administered 2016-05-04 (×2): 60 ug/min via INTRAVENOUS
  Administered 2016-05-05: 100 ug/min via INTRAVENOUS
  Administered 2016-05-05 – 2016-05-06 (×2): 90 ug/min via INTRAVENOUS
  Administered 2016-05-06: 60 ug/min via INTRAVENOUS
  Administered 2016-05-06: 90 ug/min via INTRAVENOUS
  Administered 2016-05-08: 70 ug/min via INTRAVENOUS
  Administered 2016-05-08: 115 ug/min via INTRAVENOUS
  Administered 2016-05-08: 75 ug/min via INTRAVENOUS
  Administered 2016-05-08: 105 ug/min via INTRAVENOUS
  Administered 2016-05-08: 70 ug/min via INTRAVENOUS
  Administered 2016-05-09: 160 ug/min via INTRAVENOUS
  Administered 2016-05-09: 100 ug/min via INTRAVENOUS
  Administered 2016-05-09: 200 ug/min via INTRAVENOUS
  Administered 2016-05-09: 170 ug/min via INTRAVENOUS
  Administered 2016-05-09: 140 ug/min via INTRAVENOUS
  Administered 2016-05-10: 70 ug/min via INTRAVENOUS
  Filled 2016-05-02 (×24): qty 4

## 2016-05-02 MED ORDER — FUROSEMIDE 10 MG/ML IJ SOLN
20.0000 mg | Freq: Once | INTRAMUSCULAR | Status: AC
  Administered 2016-05-02: 20 mg via INTRAVENOUS
  Filled 2016-05-02: qty 2

## 2016-05-02 MED ORDER — VANCOMYCIN HCL 10 G IV SOLR
1250.0000 mg | Freq: Two times a day (BID) | INTRAVENOUS | Status: DC
  Administered 2016-05-02: 1250 mg via INTRAVENOUS
  Filled 2016-05-02: qty 1250

## 2016-05-02 MED ORDER — SODIUM CHLORIDE 0.9 % IV SOLN
30.0000 meq | Freq: Once | INTRAVENOUS | Status: AC
  Administered 2016-05-02: 30 meq via INTRAVENOUS
  Filled 2016-05-02: qty 15

## 2016-05-02 NOTE — Progress Notes (Signed)
Pharmacy Antibiotic Note  [name] is a 50 y.o. male admitted on 04/30/2016 with pneumonia.  Pharmacy has been consulted for Vancomycin dosing.  Prior doses charted correctly, but VT high at 28.  Plan: Discontinue Vancomycin, Cefepime, Doxycycline Begin Unasyn 3gm q6hr  Height: 6\' 2"  (188 cm) Weight: 203 lb 11.3 oz (92.4 kg) IBW/kg (Calculated) : 82.2  Temp (24hrs), Avg:98.8 F (37.1 C), Min:97.9 F (36.6 C), Max:99.7 F (37.6 C)   Recent Labs Lab 04/30/16 1217 04/30/16 1233 04/30/16 1653 04/30/16 1718 05/01/16 0317 05/02/16 0432  WBC 6.1  --  4.5  --  4.0 5.7  CREATININE 0.89  --  0.73  --  0.94 0.98  LATICACIDVEN  --  2.04*  --  0.78  --   --   VANCOTROUGH  --   --   --   --   --  28*    Estimated Creatinine Clearance: 107.2 mL/min (by C-G formula based on SCr of 0.98 mg/dL).    Allergies  Allergen Reactions  . Pineapple Anaphylaxis   Antimicrobials this admission: 12/6 Cefepime >> 12/8 12/6 Vancomycin >> 12/8 12/6 doxycyline (MD) >> 12/8 12/8 Unasyn >>  Dose adjustments this admission: 12/8 0530 VT = 28 mcg/ml on 1gm IV q8h (prior to 6th dose)--change to 1250mg  iv q12hr  Microbiology results: 12/7 BAL: no organism on gram stain 12/7 BAL fungus cx: sent 12/6 BCx: ngtd 12/6 UCx: ng-final 12/6 Resp panel PCR: neg 12/6 Strep pneumo/Legionella: neg/pending 11/29 MRSA PCR: neg  Thank you for allowing pharmacy to be a part of this patient's care.  Minda Ditto PharmD Pager [phone] 05/02/2016, 1:29 PM

## 2016-05-02 NOTE — Progress Notes (Signed)
[facility] ADULT ICU REPLACEMENT PROTOCOL FOR AM LAB REPLACEMENT ONLY  The patient does apply for the [facility] Adult ICU Electrolyte Replacment Protocol based on the criteria listed below:   1. Is GFR >/= 40 ml/min? Yes.    Patient's GFR today is >60 2. Is urine output >/= 0.5 ml/kg/hr for the last 6 hours? Yes.   Patient's UOP is 1.0 ml/kg/hr 3. Is BUN < 60 mg/dL? Yes.    Patient's BUN today is 16 4. Abnormal electrolyte(s): k 3.3 5. Ordered repletion with: protocol 6. If a panic level lab has been reported, has the CCM MD in charge been notified? No..   Physician:    Ronda Fairly A 05/02/2016 6:03 AM

## 2016-05-02 NOTE — Progress Notes (Signed)
PULMONARY / CRITICAL CARE MEDICINE   Name: [name] MRN: [id number] DOB: 04/28/1966    ADMISSION DATE:  04/30/2016 CONSULTATION DATE:  04/30/2016  REFERRING MD:  Dr. Kathrynn Humble   CHIEF COMPLAINT: Hypoxia, weakness, AMS   BRIEF SUMMARY:  50 y/o M with PMH of MR (baseline lives at group home, IQ ~ 50-70) and prior tracheostomy, with recent admit for RLL PNA.  Patient treated and discharged on levaquin.  Found 12/6 altered, hypothermic, anemic with diffuse bilateral infiltrates.  Required intubation in ER.     SUBJECTIVE:  RN reports bloody secretions from ETT.  Pt did not receive blood overnight due to consent issues.  Hgb down to 6.4.  Neo weaned to 15 mcg's.    VITAL SIGNS: BP 120/68   Pulse 64   Temp 97.9 F (36.6 C)   Resp 18   Ht 6\' 2"  (1.88 m)   Wt 203 lb 11.3 oz (92.4 kg)   SpO2 95%   BMI 26.15 kg/m   HEMODYNAMICS:    VENTILATOR SETTINGS: Vent Mode: PSV;CPAP FiO2 (%):  [40 %-60 %] 40 % Set Rate:  [18 bmp] 18 bmp Vt Set:  [580 mL] 580 mL PEEP:  [8 cmH20] 8 cmH20 Pressure Support:  [10 cmH20] 10 cmH20 Plateau Pressure:  [19 cmH20-24 cmH20] 24 cmH20  INTAKE / OUTPUT: I/O last 3 completed shifts: In: 6574.7 [I.V.:4761; Blood:367; NG/GT:646.7; IV Piggyback:800] Out: 5970 [Urine:5970]  PHYSICAL EXAMINATION: General:  Pale adult male lying in bed in NAD  Neuro:  Eyes open,sedated on vent HEENT:  MM pink/dry, no jvd, old trach scar noted  Cardiovascular:  s1s2 difficult to hear due to breath sounds, SR on monitor  Lungs:  Tachypnea but non-labored, lungs bilaterally with coarse rhonchi, bloody et secretions Abdomen:  Obese/soft, bsx4 active  Musculoskeletal: no acute deformities  Skin:  Warm/dry, trace generalized edema, scattered abrasions on LE's    LABS:  BMET  Recent Labs Lab 04/30/16 1217 04/30/16 1653 05/01/16 0317 05/02/16 0432  NA 144  --  144 142  K 4.2  --  3.9 3.3*  CL 106  --  109 107  CO2 30  --  28 30  BUN 12  --  12 16  CREATININE  0.89 0.73 0.94 0.98  GLUCOSE 91  --  71 96    Electrolytes  Recent Labs Lab 04/30/16 1217  05/01/16 0317 05/01/16 1657 05/02/16 0432  CALCIUM 8.9  --  7.9*  --  8.1*  MG  --   < > 1.6* 2.1 2.1  PHOS  --   < > 2.7 3.2 3.3  < > = values in this interval not displayed.  CBC  Recent Labs Lab 04/30/16 1653 05/01/16 0317 05/01/16 1657 05/02/16 0432  WBC 4.5 4.0  --  5.7  HGB 6.4* 6.5* 7.5* 7.8*  HCT 20.8* 20.5* 23.2* 24.4*  PLT 121* 136*  --  111*    Coag's  Recent Labs Lab 04/30/16 1217  INR 1.10    Sepsis Markers  Recent Labs Lab 04/30/16 1233 04/30/16 1653 04/30/16 1718 05/01/16 0317 05/02/16 0432  LATICACIDVEN 2.04*  --  0.78  --   --   PROCALCITON  --  0.11  --  0.14 0.14    ABG  Recent Labs Lab 04/30/16 1220 04/30/16 1820 05/01/16 0340  PHART 7.409 7.403 7.478*  PCO2ART 47.0 43.8 39.5  PO2ART 150* 269* 85.5    Liver Enzymes  Recent Labs Lab 04/30/16 1217 05/02/16 0432  AST 28 76*  ALT 29 44  ALKPHOS 117 190*  BILITOT 0.4 0.5  ALBUMIN 2.7* 2.2*    Cardiac Enzymes  Recent Labs Lab 04/30/16 1217  TROPONINI 0.12*    Glucose  Recent Labs Lab 05/01/16 1210 05/01/16 1605 05/01/16 1958 05/01/16 2331 05/02/16 0333 05/02/16 0823  GLUCAP 92 95 85 87 94 115*    Imaging Dg Chest Port 1 View  Result Date: 05/02/2016 CLINICAL DATA:  Acute respiratory failure with hypoxia. EXAM: PORTABLE CHEST 1 VIEW COMPARISON:  Radiograph of May 01, 2016. FINDINGS: Stable cardiomediastinal silhouette. Endotracheal and nasogastric tubes are unchanged in position. Interval placement of right-sided PICC line with distal tip in expected position of cavoatrial junction. No pneumothorax is noted. Increased right lung opacity is noted concerning for edema or pneumonia with associated pleural effusion. Left perihilar opacity is noted concerning for pneumonia or edema. Bony thorax is unremarkable. IMPRESSION: Stable support apparatus. Interval  placement of right-sided PICC line with distal tip in expected position of cavoatrial junction. Increased right lung opacity is noted concerning for worsening edema or pneumonia with associated pleural effusion. Stable left perihilar edema or infiltrate is noted. Electronically Signed   By: Marijo Conception, M.D.   On: 05/02/2016 08:32   Dg Chest Port 1 View  Result Date: 05/01/2016 CLINICAL DATA:  Acute respiratory failure with hypoxia, hospital acquired pneumonia EXAM: PORTABLE CHEST 1 VIEW COMPARISON:  Chest x-ray of April 30, 2016 FINDINGS: The endotracheal tube has been withdrawn and its tip now lies 3.3 cm above the carina. The lungs appear better aerated. There has been interval decrease in the conspicuity of alveolar opacities throughout both lungs. There is persistent alveolar density in the right upper lobe. The interstitial markings remain increased in the mid and lower right lung and in the left lung. The heart is normal in size. The pulmonary vascularity is not clearly engorged. The esophagogastric tube tip projects below the inferior margin of the image. IMPRESSION: Improved appearance of both lungs. Decreased alveolar opacities consistent with improving pneumonia. Successful repositioning of the endotracheal tube. Electronically Signed   By: Trever  Martinique M.D.   On: 05/01/2016 10:13     STUDIES:  CXR 12/6 >> Severe diffuse bilateral airspace disease. Small right effusion  ECHO 12/6 >> ef 60%  CULTURES: Blood 12/6 >> Urine 12/6 >>neg RVP 12/6 >> neg U. Strep 12/6 >>  U. Legionella 12/6 >>  RVP 12/6 >>   ANTIBIOTICS: Cefepime 12/6 >> 12/8 Vanco 12/6 >> 12/8 Doxycycline 12/6 >> 12/8 Unasyn 12/8 >   SIGNIFICANT EVENTS: 11/28-12/1 > Admission for CAP vs Aspiration PNA > D/C with 7 day course of Levaquin   LINES/TUBES: ETT 12/6 >>   DISCUSSION: 50 y/o M, group home resident, with PMH of mental delay & recent hospitalization for RLL PNA admitted 12/6 with diffuse  infiltrates, hypothermia and concern for sepsis.   ASSESSMENT / PLAN:  PULMONARY A: Acute Hypoxic Respiratory Failure secondary to Diffuse Bilateral Infiltrates - ddx includes aspiration, pneumonitis, viral PNA, HCAP, alveolar hemorrhage (although no hx hemoptysis, low Hgb) Pa/FiO2 ratio compatible with moderate ARDS Hx Tracheostomy  P:   PRVC 8 cc/kg   Daily SBT / WUA  Aspiration precautions   Post FOB  Continues to have hemoptysis with clots Trend CXR while intubated  See ID  Consider CT of chest to evaluate parenchyma   CARDIOVASCULAR A:  Hypotension - resolving with IVF  Profound Hypothermia - admit rectal temp of 89 Troponin Leak - suspect in setting of demand ischemia  P:   ICU hemodynamic monitoring  Trend troponin, BNP Neosynephrine as needed to support MAP >65.  Anticipate BP to improve with PRBC Assess TSH, cortisol both wnl PRBC's as below with troponin leak   RENAL Lab Results  Component Value Date   CREATININE 0.98 05/02/2016   CREATININE 0.94 05/01/2016   CREATININE 0.73 04/30/2016    Recent Labs Lab 04/30/16 1217 05/01/16 0317 05/02/16 0432  K 4.2 3.9 3.3*     A:   At Risk AKI Hx BPH  P:   Trend BMP / UOP  Replace electrolytes as needed  Avoid nephrotoxic agents D5NS @ 100 ml/hr Hold flomax  GASTROINTESTINAL A:   H/O dysphagia - previously on D3 diet with honey thick liquids  P:   NPO  Place OGT  Pepcid for SUP  Begin TF per Nutrition  F/u FOBT   HEMATOLOGIC  Recent Labs  05/01/16 1657 05/02/16 0432  HGB 7.5* 7.8*    A:   Anemia - no evidence of acute bleeding, baseline Hgb appears to be 11-12.  Prior recent work up consistent with IDA, r/o hemolytic anemia Thrombocytopenia  P:  Trend CBC   H/H Heparin for DVT prophylaxis  F/u LDH 189, haptoglobin 230, peripheral smear   INFECTIOUS A:   Diffuse Bilateral Infiltrates HCAP vs Aspiration PNA Recent PNA with Hospitalization  P:   Abx as above  Follow bacterial /  viral cultures   PCT 0.14  ENDOCRINE CBG (last 3)   Recent Labs  05/01/16 2331 05/02/16 0333 05/02/16 0823  GLUCAP 87 94 115*     A:   At Risk Hypoglycemia with NPO status  P:   Monitor glucose Q6 while NPO  Dextrose to IVF's   NEUROLOGIC A:   Acute Metabolic Encephalopathy  Hx Mild MR (IQ 50-70) Hx Seizures, prior closed head injury, Bipolar Disorder, Anxiety  P:   RASS goal: 0 to -1  Resume home cogentin, klonopin, lexapro, risperdal, carbamazepine and lamotrigine Hold home trazodone, ambien  PRN fentanyl / versed for sedation   FAMILY  - Updates: Attempted to call mother, no answer.  Pt's caretaker [name] [phone] updated on status.  Group home - [facility] updated via phone 12/7.  Consent obtained for blood and FOB from [place] director of the group home.  She reports the mother prefers for them to help with medical care.  No answer from Mother via phone.    - Inter-disciplinary family meet or Palliative Care meeting due by:  12/13  CC Time:  30 minutes   Richardson Landry Minor ACNP Maryanna Shape PCCM Pager [phone] till 3 pm If no answer page [phone] 05/02/2016, 8:58 AM    ATTENDING NOTE / ATTESTATION NOTE :   I have discussed the case with the resident/APP  Richardson Landry Minor NP.   I agree with the resident/APP's  history, physical examination, assessment, and plans.    I have edited the above note and modified it according to our agreed history, physical examination, assessment and plan.   Briefly, patient admitted for worsening dyspnea. Patient has mental retardation is baseline. He lives at a group home. He was admitted recently for right lower lobe pneumonia. He was discharged on levofloxacin. He got some better. On day of admission, he was noted to be altered, hypothermic, also with bilateral infiltrates. He was intubated for hypoxemic respiratory failure. He had hemoptysis. Also, was found to be anemic requiring transfusion. He was started on  Neo-Synephrine peripherally.  Overnight, no issues. Now  on  40% FiO2. Some hemoptysis, better. Neo-Synephrine being weaned off. Patient seen, examined. Awake, comfortable, following commands. Vital signs stable. Good air entry. Crackles at the bases. Good S1 and S2. Belly was soft and benign. Warm extremities. Gr 1 edema. Rest of exam per Richardson Landry.   Assessment/Plan : 1. Acute hypoxemic hypercapnic respiratory failure secondary to healthcare associated pneumonia. Patient had hemoptysis after intubation. Bronchoscopy done showed he had bleeding in the distal trachea and in the distal ET tube likely related to trauma. Chest x-ray today with pulmonary edema as well. - cont vent support. Daily PST. I doubt we can extubate over the weekend. - will de-escalate to Unasyn as cultures are (-).  - lasix 20 mg IV as BP still soft - Continue Pulmicort twice a day and DuoNeb 4 times a day. - Cut down FiO2 to keep O2 sats more than 88%.   2. Sepsis 2/2 above.  - Patient has been adequately resuscitated. Wean off Neo-Synephrine. Lactic acid reassuring.  3. Chronic Anemia. Likely iron deficiency anemia. S/P transfusion on 12/7  - observe  4. Mental retardation. Seizure disorder. Anxiety disorder. - Resume home meds.  I have spent 30  minutes of critical care time with this patient today.  Family :  No family at bedside.    Monica Becton, MD 05/02/2016, 12:23 PM [facility] Pager [phone] After 3 pm or if no answer, call [phone]

## 2016-05-02 NOTE — Progress Notes (Signed)
Nutrition Follow-up  DOCUMENTATION CODES:   Not applicable  INTERVENTION:  - Continue Vital 1.5 @ 50 mL/hr with 30 mL Prostat TID and 50 mL free water QID.  - PEPuP protocol. - RD will follow-up 12/11.  NUTRITION DIAGNOSIS:   Inadequate oral intake related to inability to eat as evidenced by NPO status. -ongoing  GOAL:   Patient will meet greater than or equal to 90% of their needs -met with current TF regimen.  MONITOR:   TF tolerance, Vent status, Weight trends, Labs, Skin, I & O's  ASSESSMENT:   50 year old male with PMH of a traumatic brain injury with cognition difficulty whom lives in a group home, BPH, anxiety, and bipolar disorder;  presented 12/6 to ED from PCP with hypoxia, weakness and AMS. Per caretaker patient has not seemed the same since discharge on 12/1. Upon arrival to ED, patient rectal temp 89.2. CXR revealed progressed bilateral airspace disease with a right effusion. PAN cultures were sent and antibiotics started.  12/8 No family/visitors at bedside this AM. Pt with OGT and currently receiving TF at ordered goal rate: Vital 1.5 @ 50 mL/hr with 30 mL Prostat TID and 50 mL free water QID which is providing 2100 kcal (96% re-estimated kcal need), 126 grams of protein, and 1117 mL free water. Estimated kcal need updated this AM based on current Tmax and Ve; continue to use weight from 11/29 (88 kg) to estimate needs although weight now beginning to trend down and is -2.6 kg from yesterday.   Spoke with RN who reports good tolerance of TF. Spoke with Richardson Landry, PCCM NP, on the phone and plan is to change IVF to no longer contain dextrose now that TF at goal rate. Physical assessment shows no muscle and no fat wasting, moderate edema present.   Patient is currently intubated on ventilator support MV: 11.6 L/min Temp (24hrs), Avg:99 F (37.2 C), Min:97.9 F (36.6 C), Max:99.9 F (37.7 C) Propofol: none  Medications reviewed; 20 mg Pepcid per OGT/day, 2 mg IV Mg  sulfate x1 dose yesterday, PRN IV Zofran, 30 mEq IV KCl x1 dose today. Labs reviewed; CBGs: 94 and 115 mg/dL, K: 3.3 mmol/L, Ca: 8.1 mg/dL, AST and Alk Phos elevated.   IVF: D5-NS @ 100 mL/hr (408 kcal; plan per PCCM NP is to change this this afternoon) Drip: Nep @ 10 mcg/min.    12/7 - PMH outlined above from H&P; no documented hx of DM.  - No family/visitors present at bedside at this time.  - Pt was intubated yesterday PM and OGT placed at that time.  - Per chart review, weight +7 kg since 11/29 so will use weight from that time (88 kg) to estimate nutrition needs.  - Will continue to monitor weight trends closely and will adjust as needed.  - Unable to perform physical assessment at this time as tech in room to perform EKG.  - Will complete physical assessment and document findings at that time.  - RN assessment indicates pt with mild to moderate edema throughout.  - Dr. Corrie Dandy reports possible bronchoscopy later today.  - RN reports plan for TF to start this evening.  - H&P indicates pt with hx of dysphagia and was previous on a dysphagia 3 diet with honey-thick liquids.   Patient is currently intubated on ventilator support MV: 9.8 L/min Temp (24hrs), Avg:98.4 F (36.9 C), Min:90.4 F (32.4 C), Max:99.9 F (37.7 C) Propofol: none BP: 99/77 and MAP: 83 IVF: D5-1/2 NS @  100 mL/hr (306 kcal).  Drip: Neo @ 10 mcg/min.    Diet Order:  Diet NPO time specified  Skin:  Wound (see comment) (Stage 1 R ankle pressure injury)  Last BM:  12/8  Height:   Ht Readings from Last 1 Encounters:  04/30/16 6' 2"  (1.88 m)    Weight:   Wt Readings from Last 1 Encounters:  05/02/16 203 lb 11.3 oz (92.4 kg)    Ideal Body Weight:  86.36 kg  BMI:  Body mass index is 26.15 kg/m.  Estimated Nutritional Needs:   Kcal:  2194  Protein:  114-132 grams (1.3-1.5 grams/kg)  Fluid:  1.8-2 L/day  EDUCATION NEEDS:   No education needs identified at this time    Jarome Matin, MS, RD, LDN, CNSC Inpatient Clinical Dietitian Pager # [phone] After hours/weekend pager # [phone]

## 2016-05-02 NOTE — Progress Notes (Signed)
Pharmacy Antibiotic Note  [name] is a 50 y.o. male admitted on 04/30/2016 with pneumonia.  Pharmacy has been consulted for Vancomycin dosing.  Prior doses charted correctly, but VT high at 28.  Plan: Change vancomycin to 1250mg  iv q12hr, next dose at 1000  Height: 6\' 2"  (188 cm) Weight: 203 lb 11.3 oz (92.4 kg) IBW/kg (Calculated) : 82.2  Temp (24hrs), Avg:99.1 F (37.3 C), Min:97.9 F (36.6 C), Max:99.9 F (37.7 C)   Recent Labs Lab 04/30/16 1217 04/30/16 1233 04/30/16 1653 04/30/16 1718 05/01/16 0317 05/02/16 0432  WBC 6.1  --  4.5  --  4.0 5.7  CREATININE 0.89  --  0.73  --  0.94 0.98  LATICACIDVEN  --  2.04*  --  0.78  --   --   VANCOTROUGH  --   --   --   --   --  28*    Estimated Creatinine Clearance: 107.2 mL/min (by C-G formula based on SCr of 0.98 mg/dL).    Allergies  Allergen Reactions  . Pineapple Anaphylaxis    Antimicrobials this admission: 12/6 Cefepime >>  12/6 Vancomycin >>  12/6 doxycyline (MD)  Dose adjustments this admission: 12/8 0530 VT = _28_mcg/ml on 1gm IV q8h (prior to 6th dose)--change to 1250mg  iv q12hr  Microbiology results: 12/6 BCx: sent 12/6 UCx: sent  12/6 Resp panel: pending 11/29 MRSA PCR: neg  Thank you for allowing pharmacy to be a part of this patient's care.  Nani Skillern Crowford 05/02/2016 6:50 AM

## 2016-05-03 ENCOUNTER — Other Ambulatory Visit: Payer: Self-pay | Admitting: Neurology

## 2016-05-03 ENCOUNTER — Inpatient Hospital Stay (HOSPITAL_COMMUNITY): Payer: Medicare Other

## 2016-05-03 LAB — CULTURE, RESPIRATORY
CULTURE: NO GROWTH
SPECIAL REQUESTS: NORMAL

## 2016-05-03 LAB — CBC
HEMATOCRIT: 23.7 % — AB (ref 39.0–52.0)
Hemoglobin: 7.6 g/dL — ABNORMAL LOW (ref 13.0–17.0)
MCH: 28.9 pg (ref 26.0–34.0)
MCHC: 32.1 g/dL (ref 30.0–36.0)
MCV: 90.1 fL (ref 78.0–100.0)
Platelets: 120 10*3/uL — ABNORMAL LOW (ref 150–400)
RBC: 2.63 MIL/uL — ABNORMAL LOW (ref 4.22–5.81)
RDW: 17.8 % — AB (ref 11.5–15.5)
WBC: 5.3 10*3/uL (ref 4.0–10.5)

## 2016-05-03 LAB — GLUCOSE, CAPILLARY
GLUCOSE-CAPILLARY: 115 mg/dL — AB (ref 65–99)
GLUCOSE-CAPILLARY: 106 mg/dL — AB (ref 65–99)
GLUCOSE-CAPILLARY: 87 mg/dL (ref 65–99)
Glucose-Capillary: 103 mg/dL — ABNORMAL HIGH (ref 65–99)
Glucose-Capillary: 95 mg/dL (ref 65–99)
Glucose-Capillary: 98 mg/dL (ref 65–99)

## 2016-05-03 LAB — RESPIRATORY VIRUS PANEL
Adenovirus: NEGATIVE
INFLUENZA B 1: NEGATIVE
Influenza A: NEGATIVE
METAPNEUMOVIRUS: NEGATIVE
PARAINFLUENZA 1 A: NEGATIVE
PARAINFLUENZA 2 A: NEGATIVE
PARAINFLUENZA 3 A: NEGATIVE
RESPIRATORY SYNCYTIAL VIRUS A: NEGATIVE
Respiratory Syncytial Virus B: NEGATIVE
Rhinovirus: NEGATIVE

## 2016-05-03 LAB — BASIC METABOLIC PANEL
Anion gap: 6 (ref 5–15)
BUN: 16 mg/dL (ref 6–20)
CHLORIDE: 106 mmol/L (ref 101–111)
CO2: 31 mmol/L (ref 22–32)
Calcium: 8.2 mg/dL — ABNORMAL LOW (ref 8.9–10.3)
Creatinine, Ser: 0.86 mg/dL (ref 0.61–1.24)
GFR calc Af Amer: >60 mL/min (ref >60)
GFR calc non Af Amer: >60 mL/min (ref >60)
GLUCOSE: 124 mg/dL — AB (ref 65–99)
POTASSIUM: 3.3 mmol/L — AB (ref 3.5–5.1)
Sodium: 143 mmol/L (ref 135–145)

## 2016-05-03 LAB — BLOOD GAS, ARTERIAL
Acid-Base Excess: 9.3 mmol/L — ABNORMAL HIGH (ref 0.0–2.0)
Bicarbonate: 34.8 mmol/L — ABNORMAL HIGH (ref 20.0–28.0)
Drawn by: 331471
FIO2: 100
O2 SAT: 98.9 %
PCO2 ART: 55.4 mmHg — AB (ref 32.0–48.0)
Patient temperature: 98.6
pH, Arterial: 7.414 (ref 7.350–7.450)
pO2, Arterial: 141 mmHg — ABNORMAL HIGH (ref 83.0–108.0)

## 2016-05-03 LAB — CULTURE, RESPIRATORY W GRAM STAIN

## 2016-05-03 LAB — PHOSPHORUS: Phosphorus: 2.5 mg/dL (ref 2.5–4.6)

## 2016-05-03 LAB — MAGNESIUM: Magnesium: 2 mg/dL (ref 1.7–2.4)

## 2016-05-03 MED ORDER — POTASSIUM CHLORIDE 20 MEQ/15ML (10%) PO SOLN
40.0000 meq | Freq: Once | ORAL | Status: AC
  Administered 2016-05-03: 40 meq
  Filled 2016-05-03: qty 30

## 2016-05-03 MED ORDER — FUROSEMIDE 10 MG/ML IJ SOLN
40.0000 mg | Freq: Two times a day (BID) | INTRAMUSCULAR | Status: DC
  Administered 2016-05-03 – 2016-05-04 (×3): 40 mg via INTRAVENOUS
  Filled 2016-05-03 (×3): qty 4

## 2016-05-03 NOTE — Progress Notes (Signed)
Pt self extubated. Rt talked to DR. Ramaswam. Plan for now is to have E-Link look at pt at 15:00 due to pt stable. RN at bedsude. No further order for RT at this time.

## 2016-05-03 NOTE — Progress Notes (Signed)
Pt was suctioned and appeared calm and comfortable.  RN entered the room when the vent alarm sounded and patient had self extubated.  Charge nurse and RT at bedside.  Hi flow face mask put on immediatly and patient was in no apparent distress at the time.  MD notified and he said to keep the patient on face mask and continue to monitor respiratory status.  No further orders at this time and will continue to monitor.  Enos Fling RN

## 2016-05-03 NOTE — Progress Notes (Signed)
PULMONARY / CRITICAL CARE MEDICINE   Name: [name] MRN: [id number] DOB: 1965-09-03    ADMISSION DATE:  04/30/2016 CONSULTATION DATE:  04/30/2016  REFERRING MD:  Dr. Kathrynn Humble   CHIEF COMPLAINT: Hypoxia, weakness, AMS   BRIEF SUMMARY:  50 y/o M with PMH of MR (baseline lives at group home, IQ ~ 50-70) and prior tracheostomy, with recent admit for RLL PNA.  Patient treated and discharged on levaquin.  Found 12/6 altered, hypothermic, anemic with diffuse bilateral infiltrates.  Required intubation in ER.      LINES/TUBES: ETT 12/6 >>   SIGNIFICANT EVENTS: 11/28-12/1 > Admission for CAP vs Aspiration PNA > D/C with 7 day course of Levaquin  126 - readmit 12/8 - RN reports bloody secretions from ETT.  Pt did not receive blood overnight due to consent issues.  Hgb down to 6.4.  Neo weaned to 15 mcg's.    SUBJECTIVE/OVERNIGHT/INTERVAL HX 12/9 - doinb sbt. No hemoptysis. On no sedation gtt -> calm and follows some commands. On Neo stil @ 91mcg . Diursed from 4L positive to 2L +. Fever curve better   VITAL SIGNS: BP 112/60   Pulse 61   Temp 98.6 F (37 C)   Resp 18   Ht 6\' 2"  (1.88 m)   Wt 94.1 kg (207 lb 7.3 oz)   SpO2 93%   BMI 26.64 kg/m   HEMODYNAMICS:    VENTILATOR SETTINGS: Vent Mode: PSV;CPAP FiO2 (%):  [40 %-60 %] 40 % Set Rate:  [18 bmp-40 bmp] 40 bmp Vt Set:  [580 mL] 580 mL PEEP:  [5 cmH20-8 cmH20] 5 cmH20 Pressure Support:  [5 cmH20-10 cmH20] 5 cmH20 Plateau Pressure:  [20 cmH20-23 cmH20] 23 cmH20  INTAKE / OUTPUT: I/O last 3 completed shifts: In: 3806.2 [I.V.:1519.5; NG/GT:1936.7; IV Piggyback:350] Out: 5805 [Urine:5805]  PHYSICAL EXAMINATION: General:  Pale adult male lying in bed in NAD  Neuro:  Eyes open,sedated on vent HEENT:  MM pink/dry, no jvd, old trach scar noted  Cardiovascular:  s1s2 difficult to hear due to breath sounds, SR on monitor  Lungs:  Tachypnea but non-labored, lungs bilaterally with coarse rhonchi, bloody et  secretions Abdomen:  Obese/soft, bsx4 active  Musculoskeletal: no acute deformities  Skin:  Warm/dry, trace generalized edema, scattered abrasions on LE's    LABS:  PULMONARY  Recent Labs Lab 04/30/16 1220 04/30/16 1820 05/01/16 0340  PHART 7.409 7.403 7.478*  PCO2ART 47.0 43.8 39.5  PO2ART 150* 269* 85.5  HCO3 31.4* 28.6* 28.7*  O2SAT 98.9 99.3 96.1    CBC  Recent Labs Lab 05/01/16 0317 05/01/16 1657 05/02/16 0432 05/03/16 0617  HGB 6.5* 7.5* 7.8* 7.6*  HCT 20.5* 23.2* 24.4* 23.7*  WBC 4.0  --  5.7 5.3  PLT 136*  --  111* 120*    COAGULATION  Recent Labs Lab 04/30/16 1217  INR 1.10    CARDIAC   Recent Labs Lab 04/30/16 1217  TROPONINI 0.12*   No results for input(s): PROBNP in the last 168 hours.   CHEMISTRY  Recent Labs Lab 04/30/16 1217 04/30/16 1653 05/01/16 0317 05/01/16 1657 05/02/16 0432 05/03/16 0617  NA 144  --  144  --  142 143  K 4.2  --  3.9  --  3.3* 3.3*  CL 106  --  109  --  107 106  CO2 30  --  28  --  30 31  GLUCOSE 91  --  71  --  96 124*  BUN 12  --  12  --  16 16  CREATININE 0.89 0.73 0.94  --  0.98 0.86  CALCIUM 8.9  --  7.9*  --  8.1* 8.2*  MG  --  1.8 1.6* 2.1 2.1 2.0  PHOS  --  3.4 2.7 3.2 3.3 2.5   Estimated Creatinine Clearance: 122.1 mL/min (by C-G formula based on SCr of 0.86 mg/dL).   LIVER  Recent Labs Lab 04/30/16 1217 05/02/16 0432  AST 28 76*  ALT 29 44  ALKPHOS 117 190*  BILITOT 0.4 0.5  PROT 6.0* 5.6*  ALBUMIN 2.7* 2.2*  INR 1.10  --      INFECTIOUS  Recent Labs Lab 04/30/16 1233 04/30/16 1653 04/30/16 1718 05/01/16 0317 05/02/16 0432  LATICACIDVEN 2.04*  --  0.78  --   --   PROCALCITON  --  0.11  --  0.14 0.14     ENDOCRINE CBG (last 3)   Recent Labs  05/03/16 0000 05/03/16 0445 05/03/16 0733  GLUCAP 96 115* 103*         IMAGING x48h  - image(s) personally visualized  -   highlighted in bold Dg Chest Port 1 View  Result Date: 05/03/2016 CLINICAL DATA:   Respiratory failure. EXAM: PORTABLE CHEST 1 VIEW COMPARISON:  Chest radiograph 05/02/2016. FINDINGS: ET tube terminates in the distal trachea. Right upper extremity PICC line tip projects over the superior vena cava. Monitoring leads overlie the patient. Enteric tube courses inferior to the diaphragm. Stable cardiac and mediastinal contours. Leftward patient rotation. Grossly unchanged diffuse right lung consolidation with small layering right pleural effusion. Patchy consolidation within the left lung is unchanged. Old right humerus fracture. IMPRESSION: Grossly unchanged right-greater-than-left consolidation with small right pleural effusion. Electronically Signed   By: Lovey Newcomer M.D.   On: 05/03/2016 06:58   Dg Chest Port 1 View  Result Date: 05/02/2016 CLINICAL DATA:  Acute respiratory failure with hypoxia. EXAM: PORTABLE CHEST 1 VIEW COMPARISON:  Radiograph of May 01, 2016. FINDINGS: Stable cardiomediastinal silhouette. Endotracheal and nasogastric tubes are unchanged in position. Interval placement of right-sided PICC line with distal tip in expected position of cavoatrial junction. No pneumothorax is noted. Increased right lung opacity is noted concerning for edema or pneumonia with associated pleural effusion. Left perihilar opacity is noted concerning for pneumonia or edema. Bony thorax is unremarkable. IMPRESSION: Stable support apparatus. Interval placement of right-sided PICC line with distal tip in expected position of cavoatrial junction. Increased right lung opacity is noted concerning for worsening edema or pneumonia with associated pleural effusion. Stable left perihilar edema or infiltrate is noted. Electronically Signed   By: Marijo Conception, M.D.   On: 05/02/2016 08:32   Dg Chest Port 1 View  Result Date: 05/01/2016 CLINICAL DATA:  Acute respiratory failure with hypoxia, hospital acquired pneumonia EXAM: PORTABLE CHEST 1 VIEW COMPARISON:  Chest x-ray of April 30, 2016 FINDINGS:  The endotracheal tube has been withdrawn and its tip now lies 3.3 cm above the carina. The lungs appear better aerated. There has been interval decrease in the conspicuity of alveolar opacities throughout both lungs. There is persistent alveolar density in the right upper lobe. The interstitial markings remain increased in the mid and lower right lung and in the left lung. The heart is normal in size. The pulmonary vascularity is not clearly engorged. The esophagogastric tube tip projects below the inferior margin of the image. IMPRESSION: Improved appearance of both lungs. Decreased alveolar opacities consistent with improving pneumonia. Successful repositioning of the endotracheal tube. Electronically  Signed   By: Emigdio  Martinique M.D.   On: 05/01/2016 10:13        STUDIES:  CXR 12/6 >> Severe diffuse bilateral airspace disease. Small right effusion  ECHO 12/6 >> ef 60% DISCUSSION: 50 y/o M, group home resident, with PMH of mental delay & recent hospitalization for RLL PNA admitted 12/6 with diffuse infiltrates, hypothermia and concern for sepsis.   ASSESSMENT / PLAN:  PULMONARY A: Acute Hypoxic Respiratory Failure secondary to Diffuse Bilateral Infiltrates - ddx includes aspiration, pneumonitis, viral PNA, HCAP, alveolar hemorrhage (although no hx hemoptysis, low Hgb) Pa/FiO2 ratio compatible with moderate ARDS Hx Tracheostomy    - doing sbt and not on sedation gtt but suspect prio trach hx and back to back resp admits and pressor need preclude extubation including vol overload. Hemoptysis resolved (sp bronch 12/8 - likely traumatic cause and no DAH)  P:   PRVC 8 cc/kg   Daily SBT / WUA  Aspiration precautions     CARDIOVASCULAR A:  Hypotension - resolving with IVF  Profound Hypothermia - admit rectal temp of 89 Troponin Leak - suspect in setting of demand ischemia   - low dose pressor need + on 12/9. Has very low diast. Volume overload +  P:  Lasix x 1  ICU hemodynamic  monitoring  Trend troponin, BNP Neosynephrine as needed to support MAP >60 (drop goal > 60 due to low diast).  Anticipate BP to improve with PRBC Assess TSH, cortisol both wnl PRBC's as below with troponin leak   RENAL Lab Results  Component Value Date   CREATININE 0.86 05/03/2016   CREATININE 0.98 05/02/2016   CREATININE 0.94 05/01/2016    Recent Labs Lab 05/01/16 0317 05/02/16 0432 05/03/16 0617  K 3.9 3.3* 3.3*     A:   At Risk AKI Hx BPH    low K + P:   Trend BMP / UOP  Replace electrolytes as needed  Avoid nephrotoxic agents Hold flomax  GASTROINTESTINAL A:   H/O dysphagia - previously on D3 diet with honey thick liquids  P:   NPO  Place OGT  Pepcid for SUP  Begin TF per Nutrition  F/u FOBT   HEMATOLOGIC  Recent Labs  05/02/16 0432 05/03/16 0617  HGB 7.8* 7.6*    A:   Anemia - no evidence of acute bleeding, baseline Hgb appears to be 11-12.  Prior recent work up consistent with IDA, r/o hemolytic anemia Thrombocytopenia    - nil acute  P:  Trend CBC   H/H Heparin for DVT prophylaxis  F/u LDH 189, haptoglobin 230, peripheral smear   INFECTIOUS  CULTURES: Blood 12/6 >> Urine 12/6 >>neg RVP 12/6 >> neg U. Strep 12/6 >>  neg U. Legionella 12/6 >> neg  RVP 12/6 >> neg Recent Results (from the past 240 hour(s))  Blood Culture (routine x 2)     Status: None (Preliminary result)   Collection Time: 04/30/16 12:17 PM  Result Value Ref Range Status   Specimen Description BLOOD LEFT ANTECUBITAL  Final   Special Requests BOTTLES DRAWN AEROBIC AND ANAEROBIC 5CC  Final   Culture   Final    NO GROWTH 2 DAYS Performed at [facility]    Report Status PENDING  Incomplete  Blood Culture (routine x 2)     Status: None (Preliminary result)   Collection Time: 04/30/16 12:20 PM  Result Value Ref Range Status   Specimen Description BLOOD LEFT HAND  Final   Special Requests BOTTLES DRAWN  AEROBIC ONLY 5CC  Final   Culture   Final    NO  GROWTH 2 DAYS Performed at [facility]    Report Status PENDING  Incomplete  Urine culture     Status: None   Collection Time: 04/30/16  2:31 PM  Result Value Ref Range Status   Specimen Description URINE, CATHETERIZED  Final   Special Requests NONE  Final   Culture NO GROWTH Performed at [facility]   Final   Report Status 05/01/2016 FINAL  Final  Respiratory Panel by PCR     Status: None   Collection Time: 04/30/16  9:25 PM  Result Value Ref Range Status   Adenovirus NOT DETECTED NOT DETECTED Final   Coronavirus 229E NOT DETECTED NOT DETECTED Final   Coronavirus HKU1 NOT DETECTED NOT DETECTED Final   Coronavirus NL63 NOT DETECTED NOT DETECTED Final   Coronavirus OC43 NOT DETECTED NOT DETECTED Final   Metapneumovirus NOT DETECTED NOT DETECTED Final   Rhinovirus / Enterovirus NOT DETECTED NOT DETECTED Final   Influenza A NOT DETECTED NOT DETECTED Final   Influenza B NOT DETECTED NOT DETECTED Final   Parainfluenza Virus 1 NOT DETECTED NOT DETECTED Final   Parainfluenza Virus 2 NOT DETECTED NOT DETECTED Final   Parainfluenza Virus 3 NOT DETECTED NOT DETECTED Final   Parainfluenza Virus 4 NOT DETECTED NOT DETECTED Final   Respiratory Syncytial Virus NOT DETECTED NOT DETECTED Final   Bordetella pertussis NOT DETECTED NOT DETECTED Final   Chlamydophila pneumoniae NOT DETECTED NOT DETECTED Final   Mycoplasma pneumoniae NOT DETECTED NOT DETECTED Final    Comment: Performed at [facility]  Culture, respiratory (NON-Expectorated)     Status: None   Collection Time: 05/01/16 11:15 AM  Result Value Ref Range Status   Specimen Description BRONCHIAL ALVEOLAR LAVAGE  Final   Special Requests Normal  Final   Gram Stain   Final    FEW WBC PRESENT, PREDOMINANTLY PMN NO ORGANISMS SEEN    Culture   Final    NO GROWTH 2 DAYS Performed at [facility]    Report Status 05/03/2016 FINAL  Final  Respiratory virus panel     Status: None   Collection Time:  05/01/16 11:15 AM  Result Value Ref Range Status   Respiratory Syncytial Virus A Negative Negative Final   Respiratory Syncytial Virus B Negative Negative Final   Influenza A Negative Negative Final   Influenza B Negative Negative Final   Parainfluenza 1 Negative Negative Final   Parainfluenza 2 Negative Negative Final   Parainfluenza 3 Negative Negative Final   Metapneumovirus Negative Negative Final   Rhinovirus Negative Negative Final   Adenovirus Negative Negative Final    Comment: (NOTE) Performed At: [facility] [address] [place], [place] [id number] Lindon Romp MD [id number]      A:   Diffuse Bilateral Infiltrates HCAP vs Aspiration PNA Recent PNA with Hospitalization  P:    ANTIBIOTICS: Cefepime 12/6 >> 12/8 Vanco 12/6 >> 12/8 Doxycycline 12/6 >> 12/8 Unasyn 12/8 >   Abx as above  Follow bacterial / viral cultures   PCT 0.14  ENDOCRINE CBG (last 3)   Recent Labs  05/03/16 0000 05/03/16 0445 05/03/16 0733  GLUCAP 96 115* 103*     A:   At Risk Hypoglycemia with NPO status  P:   Monitor glucose Q6 while NPO  Dextrose to IVF's   NEUROLOGIC A:   Acute Metabolic Encephalopathy  Hx Mild MR (IQ 50-70)  Hx Seizures, prior closed head injury, Bipolar Disorder, Anxiety    - slowly improving P:   RASS goal: 0 to -1  continue home cogentin, klonopin, lexapro, risperdal, carbamazepine and lamotrigine Hold home trazodone, ambien  PRN fentanyl / versed for sedation   FAMILY  - Updates: Attempted to call mother, no answer.  Pt's caretaker [name] [phone] updated on status.  Group home - [facility] updated via phone 12/7.  Consent obtained for blood and FOB from [place] director of the group home.  She reports the mother prefers for them to help with medical care.  No answer from Mother via phone.   - no family 05/02/16   - Inter-disciplinary family meet or Palliative Care meeting due by:  12/13     The  patient is critically ill with multiple organ systems failure and requires high complexity decision making for assessment and support, frequent evaluation and titration of therapies, application of advanced monitoring technologies and extensive interpretation of multiple databases.   Critical Care Time devoted to patient care services described in this note is  30  Minutes. This time reflects time of care of this signee Dr Brand Males. This critical care time does not reflect procedure time, or teaching time or supervisory time of PA/NP/Med student/Med Resident etc but could involve care discussion time    Dr. Brand Males, M.D., [facility].C.P Pulmonary and Critical Care Medicine Staff Physician [facility] Pager: [phone], If no answer or between  15:00h - 7:00h: call [phone]  05/03/2016 9:43 AM

## 2016-05-03 NOTE — Progress Notes (Addendum)
eLink Physician-Brief Progress Note Patient Name: [name] DOB: [birth year] MRN: [id number]   Date of Service  05/03/2016  HPI/Events of Note  Multiple issues: 1. Patient taking off mask and desats into 80's. With FM O2 on sat = 98%, RR = 18, BP = 156/66 and HR = 61 and 2. Patient on oral meds and is NPO.  eICU Interventions  Will order: 1. Bilateral wrist restraints. 1. Place NGT to LIS.      Intervention Category Minor Interventions: Agitation / anxiety - evaluation and management  Lysle Dingwall 05/03/2016, 8:37 PM

## 2016-05-04 ENCOUNTER — Inpatient Hospital Stay (HOSPITAL_COMMUNITY): Payer: Medicare Other | Admitting: Anesthesiology

## 2016-05-04 ENCOUNTER — Inpatient Hospital Stay (HOSPITAL_COMMUNITY): Payer: Medicare Other

## 2016-05-04 DIAGNOSIS — I959 Hypotension, unspecified: Secondary | ICD-10-CM

## 2016-05-04 DIAGNOSIS — J939 Pneumothorax, unspecified: Secondary | ICD-10-CM

## 2016-05-04 LAB — BLOOD GAS, ARTERIAL
ACID-BASE EXCESS: 13.7 mmol/L — AB (ref 0.0–2.0)
ACID-BASE EXCESS: 12.8 mmol/L — AB (ref 0.0–2.0)
Acid-Base Excess: 13.8 mmol/L — ABNORMAL HIGH (ref 0.0–2.0)
BICARBONATE: 37.8 mmol/L — AB (ref 20.0–28.0)
Bicarbonate: 39.4 mmol/L — ABNORMAL HIGH (ref 20.0–28.0)
Bicarbonate: 36.7 mmol/L — ABNORMAL HIGH (ref 20.0–28.0)
Drawn by: 331471
Drawn by: 331471
Drawn by: 331471
FIO2: 100
FIO2: 100
FIO2: 70
LHR: 10 {breaths}/min
MECHVT: 580 mL
O2 SAT: 94.5 %
O2 Saturation: 100 %
O2 Saturation: 99.2 %
PATIENT TEMPERATURE: 98.6
PCO2 ART: 57.6 mmHg — AB (ref 32.0–48.0)
PCO2 ART: 50.8 mmHg — AB (ref 32.0–48.0)
PEEP/CPAP: 5 cmH2O
PEEP: 5 cmH2O
PH ART: 7.45 (ref 7.350–7.450)
PO2 ART: 75.8 mmHg — AB (ref 83.0–108.0)
PO2 ART: 163 mmHg — AB (ref 83.0–108.0)
Patient temperature: 98.6
Patient temperature: 98.6
RATE: 18 resp/min
VT: 580 mL
pCO2 arterial: 34.7 mmHg (ref 32.0–48.0)
pH, Arterial: 7.626 (ref 7.350–7.450)
pH, Arterial: 7.485 — ABNORMAL HIGH (ref 7.350–7.450)
pO2, Arterial: 255 mmHg — ABNORMAL HIGH (ref 83.0–108.0)

## 2016-05-04 LAB — BODY FLUID CELL COUNT WITH DIFFERENTIAL
Eos, Fluid: 0 %
LYMPHS FL: 16 %
MONOCYTE-MACROPHAGE-SEROUS FLUID: 48 % — AB (ref 50–90)
Neutrophil Count, Fluid: 36 % — ABNORMAL HIGH (ref 0–25)
Total Nucleated Cell Count, Fluid: 589 cu mm (ref 0–1000)

## 2016-05-04 LAB — GLUCOSE, CAPILLARY
GLUCOSE-CAPILLARY: 105 mg/dL — AB (ref 65–99)
GLUCOSE-CAPILLARY: 112 mg/dL — AB (ref 65–99)
GLUCOSE-CAPILLARY: 83 mg/dL (ref 65–99)
GLUCOSE-CAPILLARY: 87 mg/dL (ref 65–99)
Glucose-Capillary: 103 mg/dL — ABNORMAL HIGH (ref 65–99)
Glucose-Capillary: 106 mg/dL — ABNORMAL HIGH (ref 65–99)
Glucose-Capillary: 90 mg/dL (ref 65–99)
Glucose-Capillary: 94 mg/dL (ref 65–99)

## 2016-05-04 LAB — PHOSPHORUS: Phosphorus: 3.9 mg/dL (ref 2.5–4.6)

## 2016-05-04 LAB — LACTATE DEHYDROGENASE, PLEURAL OR PERITONEAL FLUID: LD FL: 250 U/L — AB (ref 3–23)

## 2016-05-04 LAB — PROTEIN, BODY FLUID: Total protein, fluid: <3 g/dL

## 2016-05-04 MED ORDER — SUCCINYLCHOLINE CHLORIDE 20 MG/ML IJ SOLN
INTRAMUSCULAR | Status: DC | PRN
  Administered 2016-05-04: 120 mg via INTRAVENOUS

## 2016-05-04 MED ORDER — PROPOFOL 10 MG/ML IV BOLUS
INTRAVENOUS | Status: AC
  Filled 2016-05-04: qty 40

## 2016-05-04 MED ORDER — SODIUM CHLORIDE 0.9 % IV SOLN
0.0000 ug/h | INTRAVENOUS | Status: DC
  Filled 2016-05-04: qty 50

## 2016-05-04 MED ORDER — PROPOFOL 10 MG/ML IV BOLUS
INTRAVENOUS | Status: AC
  Filled 2016-05-04: qty 20

## 2016-05-04 MED ORDER — POTASSIUM CHLORIDE 20 MEQ/15ML (10%) PO SOLN
40.0000 meq | Freq: Once | ORAL | Status: AC
  Administered 2016-05-04: 40 meq
  Filled 2016-05-04: qty 30

## 2016-05-04 MED ORDER — INSULIN ASPART 100 UNIT/ML ~~LOC~~ SOLN
2.0000 [IU] | SUBCUTANEOUS | Status: DC
  Administered 2016-05-06 – 2016-05-08 (×4): 2 [IU] via SUBCUTANEOUS
  Administered 2016-05-09: 4 [IU] via SUBCUTANEOUS
  Administered 2016-05-09: 2 [IU] via SUBCUTANEOUS
  Administered 2016-05-10: 4 [IU] via SUBCUTANEOUS
  Administered 2016-05-10: 2 [IU] via SUBCUTANEOUS
  Administered 2016-05-10: 4 [IU] via SUBCUTANEOUS
  Administered 2016-05-10 (×2): 2 [IU] via SUBCUTANEOUS
  Administered 2016-05-10: 4 [IU] via SUBCUTANEOUS
  Administered 2016-05-10: 2 [IU] via SUBCUTANEOUS
  Administered 2016-05-11 – 2016-05-12 (×7): 4 [IU] via SUBCUTANEOUS

## 2016-05-04 MED ORDER — PROPOFOL 10 MG/ML IV BOLUS
INTRAVENOUS | Status: DC | PRN
  Administered 2016-05-04: 100 mg via INTRAVENOUS

## 2016-05-04 MED ORDER — FUROSEMIDE 10 MG/ML IJ SOLN
40.0000 mg | Freq: Every day | INTRAMUSCULAR | Status: DC
  Administered 2016-05-05: 40 mg via INTRAVENOUS
  Filled 2016-05-04: qty 4

## 2016-05-04 MED ORDER — SUCCINYLCHOLINE CHLORIDE 200 MG/10ML IV SOSY
PREFILLED_SYRINGE | INTRAVENOUS | Status: AC
  Filled 2016-05-04: qty 10

## 2016-05-04 MED ORDER — SODIUM CHLORIDE 0.9 % IV SOLN
50.0000 ug/h | INTRAVENOUS | Status: DC
  Administered 2016-05-04: 50 ug/h via INTRAVENOUS
  Administered 2016-05-06: 25 ug/h via INTRAVENOUS
  Filled 2016-05-04 (×2): qty 50

## 2016-05-04 NOTE — Progress Notes (Signed)
Informed Dr. Chase Caller that patient is now coughing up thick secretions. Afraid pt will aspirate. Asked to call anest to intubate patient.   [name]

## 2016-05-04 NOTE — Progress Notes (Signed)
Update of today's events given [name] at [facility].  [name]

## 2016-05-04 NOTE — Procedures (Signed)
Consent:  Procedure performed emergently.  Indication for Procedure:  Right Hydropneumothorax on Ventilator  Medications Administered: 1. Versed 2 mg IV push 2. Fentanyl 25 g IV push 3. Lidocaine 1% 5 mL Injection into chest wall  Description of Procedure: Patient found to have a right hydropneumothorax at least moderate in size post intubation on positive pressure ventilation. Patient was rotated to his left side. Absence of lung sliding was visualized with the bedside ultrasound. Patient was prepped and draped in sterile fashion. Finder needle with lidocaine was inserted over the patient's rib inferior and in the mid-axillary line with aspiration of air. Lidocaine was then administered for local anesthesia. Needle was then inserted between the rib into the pleural space and angled cephalad. Guidewire was inserted through the needle into the pleural space and the needle was removed. A skin nick was made and dilator was used over the guidewire. The catheter was then inserted over the guidewire as stylette was held stationary. Stylette and guidewire were removed. Catheter was then hooked up to suction and a combination of air and yellow pleural fluid was aspirated. Chest tube was left to continuous suction. Catheter was sewed into place and dressed.  Condition:  Remains critically ill in ICU on ventilator. Awaiting stat portable chest x-ray to confirm tube location. Pleural fluid studies were ordered along with cook catheter order set.   Complications:  None.

## 2016-05-04 NOTE — Progress Notes (Signed)
Verbal order to call anesth and intubate from Dr. Chase Caller.   [name]

## 2016-05-04 NOTE — Progress Notes (Signed)
CPT not done due to pt getting a chest tube.

## 2016-05-04 NOTE — Progress Notes (Addendum)
eLink Physician-Brief Progress Note Patient Name: [name] DOB: Jan 26, 1966 MRN: [id number]   Date of Service  05/04/2016  HPI/Events of Note  Patient with 50% R pneumothorax. Patient is intubated and mechanically ventilated. Will require tube thoracostomy decompression.   eICU Interventions  Will order: 1. Dr. Ashok Cordia, ICU bedside coverage, notified of emergent need for tube thoracostomy.      Intervention Category Intermediate Interventions: Respiratory distress - evaluation and management  [name],Steven Eugene 05/04/2016, 3:18 PM

## 2016-05-04 NOTE — Progress Notes (Signed)
Informed Dr. Chase Caller patient's o2 sats have dropped to 87-88% on 15L NRB. Chest xray ordered.

## 2016-05-04 NOTE — Progress Notes (Signed)
PULMONARY / CRITICAL CARE MEDICINE   Name: [name] MRN: [id number] DOB: 1966/03/26    ADMISSION DATE:  04/30/2016 CONSULTATION DATE:  04/30/2016  REFERRING MD:  Dr. Kathrynn Humble   CHIEF COMPLAINT: Hypoxia, weakness, AMS   BRIEF SUMMARY:  50 y/o M with PMH of MR (baseline lives at group home, IQ ~ 50-70) and prior tracheostomy, with recent admit for RLL PNA.  Patient treated and discharged on levaquin.  Found 12/6 altered, hypothermic, anemic with diffuse bilateral infiltrates.  Required intubation in ER.       SIGNIFICANT EVENTS: 11/28-12/1 > Admission for CAP vs Aspiration PNA > D/C with 7 day course of Levaquin  .....................Marland Kitchen 126 - readmit CXR 12/6 >> Severe diffuse bilateral airspace disease. Small right effusion  ECHO 12/6 >> ef 60% 12/8 - RN reports bloody secretions from ETT.  Pt did not receive blood overnight due to consent issues.  Hgb down to 6.4.  Neo weaned to 15 mcg's.  12/9 - doinb sbt. No hemoptysis. On no sedation gtt -> calm and follows some commands. On Neo stil @ 61mcg . Diursed from 4L positive to 2L +. Fever curve better    SUBJECTIVE/OVERNIGHT/INTERVAL HX 12/10 - self extubated yesterdaty. Remain on neo. Still needing face mask o2. Diuresing. Follows commands but hypoactive delirium + per RN. Now 4L negative. Vomited yesterday and son on NG to LIS without problems  VITAL SIGNS: BP (!) 101/50   Pulse 70   Temp 99 F (37.2 C)   Resp 19   Ht 6\' 2"  (1.88 m)   Wt 87 kg (191 lb 12.8 oz)   SpO2 93%   BMI 24.63 kg/m   HEMODYNAMICS:    VENTILATOR SETTINGS: Vent Mode: PSV;CPAP FiO2 (%):  [100 %] 100 % Set Rate:  [40 bmp] 40 bmp PEEP:  [5 cmH20] 5 cmH20 Pressure Support:  [5 cmH20] 5 cmH20  INTAKE / OUTPUT: I/O last 3 completed shifts: In: 1991.7 [I.V.:430; NG/GT:961.7; IV Piggyback:600] Out: 8095 [Urine:7795; Emesis/NG output:300]  PHYSICAL EXAMINATION: General:  Pale adult male lying in bed in NAD  Neuro:  Eyes open,Nods to questions.  Moves all 4s. In restratins HEENT:  MM pink/dry, no jvd, old trach scar noted  Cardiovascular:  s1s2 difficult to hear due to breath sounds, SR on monitor  Lungs:  Tachypnea but non-labored, lungs bilaterally with coarse rhonchi, bloody et secretions Abdomen:  Obese/soft, bsx4 active  Musculoskeletal: no acute deformities  Skin:  Warm/dry, trace generalized edema, scattered abrasions on LE's    LABS:  PULMONARY  Recent Labs Lab 04/30/16 1220 04/30/16 1820 05/01/16 0340 05/03/16 1808  PHART 7.409 7.403 7.478* 7.414  PCO2ART 47.0 43.8 39.5 55.4*  PO2ART 150* 269* 85.5 141*  HCO3 31.4* 28.6* 28.7* 34.8*  O2SAT 98.9 99.3 96.1 98.9    CBC  Recent Labs Lab 05/01/16 0317 05/01/16 1657 05/02/16 0432 05/03/16 0617  HGB 6.5* 7.5* 7.8* 7.6*  HCT 20.5* 23.2* 24.4* 23.7*  WBC 4.0  --  5.7 5.3  PLT 136*  --  111* 120*    COAGULATION  Recent Labs Lab 04/30/16 1217  INR 1.10    CARDIAC    Recent Labs Lab 04/30/16 1217  TROPONINI 0.12*   No results for input(s): PROBNP in the last 168 hours.   CHEMISTRY  Recent Labs Lab 04/30/16 1217  04/30/16 1653 05/01/16 0317 05/01/16 1657 05/02/16 0432 05/03/16 0617 05/04/16 0427  NA 144  --   --  144  --  142 143  --  K 4.2  --   --  3.9  --  3.3* 3.3*  --   CL 106  --   --  109  --  107 106  --   CO2 30  --   --  28  --  30 31  --   GLUCOSE 91  --   --  71  --  96 124*  --   BUN 12  --   --  12  --  16 16  --   CREATININE 0.89  --  0.73 0.94  --  0.98 0.86  --   CALCIUM 8.9  --   --  7.9*  --  8.1* 8.2*  --   MG  --   --  1.8 1.6* 2.1 2.1 2.0  --   PHOS  --   < > 3.4 2.7 3.2 3.3 2.5 3.9  < > = values in this interval not displayed. Estimated Creatinine Clearance: 122.1 mL/min (by C-G formula based on SCr of 0.86 mg/dL).   LIVER  Recent Labs Lab 04/30/16 1217 05/02/16 0432  AST 28 76*  ALT 29 44  ALKPHOS 117 190*  BILITOT 0.4 0.5  PROT 6.0* 5.6*  ALBUMIN 2.7* 2.2*  INR 1.10  --       INFECTIOUS  Recent Labs Lab 04/30/16 1233 04/30/16 1653 04/30/16 1718 05/01/16 0317 05/02/16 0432  LATICACIDVEN 2.04*  --  0.78  --   --   PROCALCITON  --  0.11  --  0.14 0.14     ENDOCRINE CBG (last 3)   Recent Labs  05/04/16 0053 05/04/16 0415 05/04/16 0737  GLUCAP 112* 83 87         IMAGING x48h  - image(s) personally visualized  -   highlighted in bold Dg Chest 1 View  Result Date: 05/03/2016 CLINICAL DATA:  Recent RIGHT lower lobe pneumonia, concern for possible aspiration during orogastric tube feeding, former smoker EXAM: CHEST 1 VIEW COMPARISON:  Portable exam 1208 hours compared to 0450 hours FINDINGS: Tip of endotracheal tube projects 3.8 cm above carina. Nasogastric tube extends into stomach. RIGHT arm PICC line tip projects over RIGHT atrium though this may be accentuated by positioning, rotation and kyphosis. Enlargement of cardiac silhouette. BILATERAL pulmonary infiltrates RIGHT greater than LEFT. Probable small RIGHT pleural effusion. No definite pneumothorax. Bones demineralized with old healed RIGHT humeral diaphyseal fracture deformity. IMPRESSION: Persistent BILATERAL pulmonary infiltrates RIGHT greater than LEFT with suspected small RIGHT pleural effusion. No significant interval change since earlier study. Electronically Signed   By: Lavonia Dana M.D.   On: 05/03/2016 12:35   Dg Abd 1 View  Result Date: 05/03/2016 CLINICAL DATA:  NG tube placement. EXAM: ABDOMEN - 1 VIEW COMPARISON:  04/30/2016 FINDINGS: Tip and side port of the enteric tube below the diaphragm in the stomach. Previous prominent bowel loop in the central abdomen is no longer seen. No evidence of obstruction or free air. A right pleural effusion is incidentally noted. Scoliotic curvature of the spine. IMPRESSION: Tip and side port of the enteric tube below the diaphragm in the stomach. Electronically Signed   By: Jeb Levering M.D.   On: 05/03/2016 21:44   Dg Chest Port 1  View  Result Date: 05/03/2016 CLINICAL DATA:  Respiratory failure. EXAM: PORTABLE CHEST 1 VIEW COMPARISON:  Chest radiograph 05/02/2016. FINDINGS: ET tube terminates in the distal trachea. Right upper extremity PICC line tip projects over the superior vena cava. Monitoring leads overlie the patient. Enteric tube  courses inferior to the diaphragm. Stable cardiac and mediastinal contours. Leftward patient rotation. Grossly unchanged diffuse right lung consolidation with small layering right pleural effusion. Patchy consolidation within the left lung is unchanged. Old right humerus fracture. IMPRESSION: Grossly unchanged right-greater-than-left consolidation with small right pleural effusion. Electronically Signed   By: Lovey Newcomer M.D.   On: 05/03/2016 06:58        STUDIES:   DISCUSSION: 50 y/o M, group home resident, with PMH of mental delay & recent hospitalization for RLL PNA admitted 12/6 with diffuse infiltrates, hypothermia and concern for sepsis.   ASSESSMENT / PLAN:  PULMONARY A: Acute Hypoxic Respiratory Failure secondary to Diffuse Bilateral Infiltrates - ddx includes aspiration, pneumonitis, viral PNA, HCAP, alveolar hemorrhage (although no hx hemoptysis, low Hgb) Pa/FiO2 ratio compatible with moderate ARDS Hx Tracheostomy    - . Hemoptysis resolved (sp bronch 12/8 - likely traumatic cause and no DAH) /sp self extubation 12/9 but still needing face mask 12/10  P:   o2 for puisle ox > 88% Aspiration precautions     CARDIOVASCULAR A:  Hypotension - resolving with IVF  Profound Hypothermia - admit rectal temp of 89 Troponin Leak - suspect in setting of demand ischemia   - low dose pressor need + on 12/10. Has very low diast. Volume overload +  P:  Lasix x cotninue but reduce  ICU hemodynamic monitoring  Trend troponin, BNP Neosynephrine as needed to support MAP >60 (drop goal > 60 due to low diast).  Anticipate BP to improve with PRBC Assess TSH, cortisol both  wnl PRBC's as below with troponin leak   RENAL Lab Results  Component Value Date   CREATININE 0.86 05/03/2016   CREATININE 0.98 05/02/2016   CREATININE 0.94 05/01/2016    Recent Labs Lab 05/01/16 0317 05/02/16 0432 05/03/16 0617  K 3.9 3.3* 3.3*     A:   At Risk AKI Hx BPH    low K + with lasix  P:   Trend BMP / UOP  - check again 12/11 Replace electrolytes as needed  Avoid nephrotoxic agents Hold flomax  GASTROINTESTINAL A:   H/O dysphagia - previously on D3 diet with honey thick liquids    - vomi 12/9/7 abd TF held P:   NPO  Place OGT  Pepcid for SUP  Hold TF F/u FOBT   HEMATOLOGIC  Recent Labs  05/02/16 0432 05/03/16 0617  HGB 7.8* 7.6*    A:   Anemia - no evidence of acute bleeding, baseline Hgb appears to be 11-12.  Prior recent work up consistent with IDA, r/o hemolytic anemia Thrombocytopenia    - nil acute  P:  Trend CBC   H/H Heparin for DVT prophylaxis  F/u LDH 189, haptoglobin 230, peripheral smear   INFECTIOUS  CULTURES: Blood 12/6 >> Urine 12/6 >>neg RVP 12/6 >> neg U. Strep 12/6 >>  neg U. Legionella 12/6 >> neg  RVP 12/6 >> neg    A:   Diffuse Bilateral Infiltrates HCAP vs Aspiration PNA Recent PNA with Hospitalization  P:    ANTIBIOTICS: Cefepime 12/6 >> 12/8 Vanco 12/6 >> 12/8 Doxycycline 12/6 >> 12/8 Unasyn 12/8 >   Abx as above  Follow bacterial / viral cultures   PCT 0.14  ENDOCRINE CBG (last 3)   Recent Labs  05/04/16 0053 05/04/16 0415 05/04/16 0737  GLUCAP 112* 83 87     A:   At Risk Hypoglycemia with NPO status  P:   Monitor glucose Q6 while NPO  Dextrose to IVF's   NEUROLOGIC A:   Acute Metabolic Encephalopathy  Hx Mild MR (IQ 50-70) Hx Seizures, prior closed head injury, Bipolar Disorder, Anxiety    - slowly improving P:   RASS goal: 0 to -1  continue home cogentin, klonopin, lexapro, risperdal, carbamazepine and lamotrigine - check EKG 12/11 for qtc Hold home trazodone,  ambien  PRN fentanyl / versed for sedation   FAMILY  - Updates: Attempted to call mother, no answer.  Pt's caretaker [name] [phone] updated on status.  Group home - [facility] updated via phone 12/7.  Consent obtained for blood and FOB from [place] director of the group home.  She reports the mother prefers for them to help with medical care.  No answer from Mother via phone.   - no family 05/02/16 , 12/9 and 05/04/16  - Inter-disciplinary family meet or Palliative Care meeting due by:  12/13     The patient is critically ill with multiple organ systems failure and requires high complexity decision making for assessment and support, frequent evaluation and titration of therapies, application of advanced monitoring technologies and extensive interpretation of multiple databases.   Critical Care Time devoted to patient care services described in this note is  30  Minutes. This time reflects time of care of this signee Dr Brand Males. This critical care time does not reflect procedure time, or teaching time or supervisory time of PA/NP/Med student/Med Resident etc but could involve care discussion time    Dr. Brand Males, M.D., [facility].C.P Pulmonary and Critical Care Medicine Staff Physician [place] [facility] Pager: [phone], If no answer or between  15:00h - 7:00h: call [phone]  05/04/2016 9:50 AM

## 2016-05-04 NOTE — Progress Notes (Signed)
CPT held due to chest tube placement.  Rt will monitor.

## 2016-05-04 NOTE — Progress Notes (Signed)
cxr shows white out right side  wob is ok  Plan Chest pt bipap reasess intubation risk  Dr. Brand Males, M.D., [facility].C.P Pulmonary and Critical Care Medicine Staff Physician [place] [facility] Pager: [phone], If no answer or between  15:00h - 7:00h: call [phone]  05/04/2016 11:50 AM

## 2016-05-04 NOTE — Progress Notes (Signed)
Called by Dr Derrel Nip in radlology that pt has a 50% pneumothorax on the right side.  E Link notified.  Dr Ashok Cordia to come and insert chest tube.

## 2016-05-04 NOTE — Progress Notes (Signed)
CCMD made aware of pt. Heart rate in 50s. RN will continue to monitor.

## 2016-05-04 NOTE — Anesthesia Procedure Notes (Signed)
Procedure Name: Intubation Date/Time: 05/04/2016 2:09 PM Performed by: Josephine Igo Pre-anesthesia Checklist: Emergency Drugs available, Suction available, Patient being monitored and Patient identified Patient Re-evaluated:Patient Re-evaluated prior to inductionOxygen Delivery Method: Ambu bag Preoxygenation: Pre-oxygenation with 100% oxygen Intubation Type: IV induction Ventilation: Mask ventilation without difficulty Grade View: Grade I Tube type: Subglottic suction tube Tube size: 8.0 mm Number of attempts: 2 Airway Equipment and Method: Video-laryngoscopy,  Stylet and Bougie stylet Placement Confirmation: ETT inserted through vocal cords under direct vision,  CO2 detector and breath sounds checked- equal and bilateral Secured at: 23 cm Comments: Video laryngoscopy with Glidescope #3 7.5 subglottic tube inserted under direct vision of VC's. Cuff had leak upon attempt at inflating balloon. ETT changed out over bougie to 8.0 subglottic tube without difficulty. BS= bilaterally.(+) EtCO2, Tube secured at 23 cm at the lips. Patient tolerated procedure well. CXR requested.

## 2016-05-04 NOTE — Progress Notes (Signed)
Pt on bipap. RN remains at bedside while patient is in wrist restraints and on bipap.  [name]

## 2016-05-04 NOTE — Progress Notes (Signed)
Pt placed on BIPAP/NIV at 12:00 due to low sats /ABG.

## 2016-05-04 NOTE — Progress Notes (Signed)
eLink Physician-Brief Progress Note Patient Name: [name] DOB: February 14, 1966 MRN: [id number]   Date of Service  05/04/2016  HPI/Events of Note  Multiple issues: 1. ABG on 100%/PRVC 18/TV 580/P 5 = 7.62/34.7/255/36.7 and 2. Patient requires PRN sedation Q hour.  eICU Interventions  Will order: 1. Decrease PRVC rate to 18. 2. ABG at 6:30 PM. 3. Fentanyl IV infusion. Titrate to RASS = 0.     Intervention Category Major Interventions: Acid-Base disturbance - evaluation and management Minor Interventions: Agitation / anxiety - evaluation and management  Johari Pinney [place] 05/04/2016, 5:12 PM

## 2016-05-04 NOTE — Progress Notes (Signed)
Critical lab value ABG reported to this Elink RN from [place] [facility].  Reported results to Dr Oletta Darter eMD.  Results pH 7.62 pCO2 34.7 O2 255 bicarb 36.7

## 2016-05-04 NOTE — Progress Notes (Signed)
Notified Dr. Chase Caller of ABG and chest xray results. Orders for bipap and chest Pt. Will notify RRT.  [name]

## 2016-05-04 NOTE — Progress Notes (Signed)
Pt reintubated at 14:10 due to airway protection.

## 2016-05-05 LAB — CULTURE, BLOOD (ROUTINE X 2)
CULTURE: NO GROWTH
Culture: NO GROWTH

## 2016-05-05 LAB — MAGNESIUM: Magnesium: 2 mg/dL (ref 1.7–2.4)

## 2016-05-05 LAB — CBC WITH DIFFERENTIAL/PLATELET
BASOS ABS: 0.1 10*3/uL (ref 0.0–0.1)
Basophils Relative: 1 %
EOS PCT: 5 %
Eosinophils Absolute: 0.4 10*3/uL (ref 0.0–0.7)
HCT: 27.2 % — ABNORMAL LOW (ref 39.0–52.0)
Hemoglobin: 8.7 g/dL — ABNORMAL LOW (ref 13.0–17.0)
LYMPHS PCT: 23 %
Lymphs Abs: 1.8 10*3/uL (ref 0.7–4.0)
MCH: 28.7 pg (ref 26.0–34.0)
MCHC: 32 g/dL (ref 30.0–36.0)
MCV: 89.8 fL (ref 78.0–100.0)
MONO ABS: 1.4 10*3/uL — AB (ref 0.1–1.0)
MONOS PCT: 17 %
Neutro Abs: 4.4 10*3/uL (ref 1.7–7.7)
Neutrophils Relative %: 55 %
PLATELETS: 351 10*3/uL (ref 150–400)
RBC: 3.03 MIL/uL — ABNORMAL LOW (ref 4.22–5.81)
RDW: 17.3 % — AB (ref 11.5–15.5)
WBC: 8 10*3/uL (ref 4.0–10.5)

## 2016-05-05 LAB — GLUCOSE, CAPILLARY
GLUCOSE-CAPILLARY: 112 mg/dL — AB (ref 65–99)
GLUCOSE-CAPILLARY: 100 mg/dL — AB (ref 65–99)
Glucose-Capillary: 82 mg/dL (ref 65–99)
Glucose-Capillary: 86 mg/dL (ref 65–99)
Glucose-Capillary: 107 mg/dL — ABNORMAL HIGH (ref 65–99)
Glucose-Capillary: 103 mg/dL — ABNORMAL HIGH (ref 65–99)

## 2016-05-05 LAB — BASIC METABOLIC PANEL
ANION GAP: 10 (ref 5–15)
BUN: 16 mg/dL (ref 6–20)
CALCIUM: 8.4 mg/dL — AB (ref 8.9–10.3)
CO2: 35 mmol/L — AB (ref 22–32)
CREATININE: 1.09 mg/dL (ref 0.61–1.24)
Chloride: 98 mmol/L — ABNORMAL LOW (ref 101–111)
GFR calc Af Amer: >60 mL/min (ref >60)
GLUCOSE: 85 mg/dL (ref 65–99)
Potassium: 3.5 mmol/L (ref 3.5–5.1)
Sodium: 143 mmol/L (ref 135–145)

## 2016-05-05 LAB — PHOSPHORUS: PHOSPHORUS: 3.6 mg/dL (ref 2.5–4.6)

## 2016-05-05 MED ORDER — GUAIFENESIN 100 MG/5ML PO SOLN
10.0000 mL | Freq: Four times a day (QID) | ORAL | Status: DC
Start: 2016-05-05 — End: 2016-05-14
  Administered 2016-05-05 – 2016-05-14 (×29): 200 mg
  Filled 2016-05-05: qty 10
  Filled 2016-05-05: qty 20
  Filled 2016-05-05 (×6): qty 10
  Filled 2016-05-05: qty 20
  Filled 2016-05-05: qty 10
  Filled 2016-05-05: qty 20
  Filled 2016-05-05: qty 10
  Filled 2016-05-05: qty 20
  Filled 2016-05-05: qty 10
  Filled 2016-05-05 (×2): qty 20
  Filled 2016-05-05 (×5): qty 10
  Filled 2016-05-05: qty 20
  Filled 2016-05-05: qty 10
  Filled 2016-05-05 (×2): qty 20

## 2016-05-05 MED ORDER — FREE WATER
30.0000 mL | Freq: Four times a day (QID) | Status: DC
  Administered 2016-05-05 – 2016-05-09 (×14): 30 mL

## 2016-05-05 MED ORDER — VITAL 1.5 CAL PO LIQD
1000.0000 mL | ORAL | Status: DC
  Administered 2016-05-05 – 2016-05-06 (×2): 1000 mL
  Filled 2016-05-05 (×4): qty 1000

## 2016-05-05 MED ORDER — SODIUM CHLORIDE 0.9 % IV BOLUS (SEPSIS)
1000.0000 mL | Freq: Once | INTRAVENOUS | Status: AC
  Administered 2016-05-05: 1000 mL via INTRAVENOUS

## 2016-05-05 MED ORDER — SODIUM CHLORIDE 0.9 % IV SOLN
1.0000 g | Freq: Three times a day (TID) | INTRAVENOUS | Status: AC
  Administered 2016-05-05 – 2016-05-11 (×20): 1 g via INTRAVENOUS
  Filled 2016-05-05 (×20): qty 1

## 2016-05-05 MED ORDER — SODIUM CHLORIDE 3 % IN NEBU
4.0000 mL | INHALATION_SOLUTION | Freq: Two times a day (BID) | RESPIRATORY_TRACT | Status: AC
  Administered 2016-05-05: 20:00:00 via RESPIRATORY_TRACT
  Administered 2016-05-05 – 2016-05-07 (×5): 4 mL via RESPIRATORY_TRACT
  Filled 2016-05-05 (×7): qty 4

## 2016-05-05 MED ORDER — PRO-STAT SUGAR FREE PO LIQD
30.0000 mL | Freq: Three times a day (TID) | ORAL | Status: DC
Start: 2016-05-05 — End: 2016-05-08
  Administered 2016-05-05 – 2016-05-07 (×7): 30 mL
  Filled 2016-05-05 (×7): qty 30

## 2016-05-05 MED ORDER — VANCOMYCIN HCL 10 G IV SOLR
1250.0000 mg | Freq: Two times a day (BID) | INTRAVENOUS | Status: DC
  Administered 2016-05-05 – 2016-05-07 (×5): 1250 mg via INTRAVENOUS
  Filled 2016-05-05 (×5): qty 1250

## 2016-05-05 NOTE — Progress Notes (Signed)
PULMONARY / CRITICAL CARE MEDICINE   Name: [name] MRN: [id number] DOB: [birth year]    ADMISSION DATE:  04/30/2016 CONSULTATION DATE:  04/30/2016  REFERRING MD:  Dr. Kathrynn Humble   CHIEF COMPLAINT: Hypoxia, weakness, AMS   BRIEF SUMMARY:  50 y/o M with PMH of MR (baseline lives at group home, IQ ~ 50-70) and prior tracheostomy, with recent admit for RLL PNA.  Patient treated and discharged on levaquin.  Found 12/6 altered, hypothermic, anemic with diffuse bilateral infiltrates.  Required intubation in ER.     SIGNIFICANT EVENTS: 11/28-12/1 > Admission for CAP vs Aspiration PNA > D/C with 7 day course of Levaquin  ...................... 12/6 - readmit CXR 12/6 >> Severe diffuse bilateral airspace disease. Small right effusion  ECHO 12/6 >> ef 60% 12/8 - RN reports bloody secretions from ETT.  Pt did not receive blood overnight due to consent issues.  Hgb down to 6.4.  Neo weaned to 15 mcg's.  12/9 - doinb sbt. No hemoptysis. On no sedation gtt -> calm and follows some commands. On Neo stil @ 30mcg . Diursed from 4L positive to 2L +. Fever curve better 12/10 - self extubated yesterdaty. Remain on neo. Still needing face mask o2. Diuresing. Follows commands but hypoactive delirium + per RN. Now 4L negative. Vomited 12/10 on NG to LIS without problems; re-intubated by anesthesia, R lung pneumothorax, chest tube placed   SUBJECTIVE/OVERNIGHT/INTERVAL HX Intubated yesterday, pneumothorax, chest tube placed  VITAL SIGNS: BP (!) 97/44   Pulse (!) 49   Temp (!) 101.1 F (38.4 C)   Resp 16   Ht 6\' 2"  (1.88 m)   Wt 187 lb 9.8 oz (85.1 kg)   SpO2 99%   BMI 24.09 kg/m   HEMODYNAMICS:    VENTILATOR SETTINGS: Vent Mode: PRVC FiO2 (%):  [40 %-100 %] 40 % Set Rate:  [10 bmp-18 bmp] 10 bmp Vt Set:  [580 mL] 580 mL PEEP:  [5 cmH20-8 cmH20] 5 cmH20 Pressure Support:  [10 cmH20] 10 cmH20 Plateau Pressure:  [12 cmH20-20 cmH20] 15 cmH20  INTAKE / OUTPUT: I/O last 3 completed shifts: In:  1656.4 [I.V.:1056.4; IV [id number] Out: [id number] [Urine:5570; Emesis/NG output:900; Chest Tube:500]  PHYSICAL EXAMINATION: General:  Comfortable on vent HENT: NCAT, trach scar noted, ETT in place PULM: crackles, diminished breath sounds R base CV: RRR, no mgr GI: BS+, soft, nontender MSK: normal bulk and tone Neuro: asleep, will wake to voice   LABS:  PULMONARY  Recent Labs Lab 05/01/16 0340 05/03/16 1808 05/04/16 1100 05/04/16 1640 05/04/16 1830  PHART 7.478* 7.414 7.450 7.626* 7.485*  PCO2ART 39.5 55.4* 57.6* 34.7 50.8*  PO2ART 85.5 141* 75.8* 255* 163*  HCO3 28.7* 34.8* 39.4* 36.7* 37.8*  O2SAT 96.1 98.9 94.5 100.0 99.2    CBC  Recent Labs Lab 05/02/16 0432 05/03/16 0617 05/05/16 0350  HGB 7.8* 7.6* 8.7*  HCT 24.4* 23.7* 27.2*  WBC 5.7 5.3 8.0  PLT 111* 120* 351    COAGULATION  Recent Labs Lab 04/30/16 1217  INR 1.10    CARDIAC    Recent Labs Lab 04/30/16 1217  TROPONINI 0.12*   No results for input(s): PROBNP in the last 168 hours.   CHEMISTRY  Recent Labs Lab 04/30/16 1217  04/30/16 1653 05/01/16 0317 05/01/16 1657 05/02/16 0432 05/03/16 0617 05/04/16 0427 05/05/16 0350  NA 144  --   --  144  --  142 143  --  143  K 4.2  --   --  3.9  --  3.3* 3.3*  --  3.5  CL 106  --   --  109  --  107 106  --  98*  CO2 30  --   --  28  --  30 31  --  35*  GLUCOSE 91  --   --  71  --  96 124*  --  85  BUN 12  --   --  12  --  16 16  --  16  CREATININE 0.89  --  0.73 0.94  --  0.98 0.86  --  1.09  CALCIUM 8.9  --   --  7.9*  --  8.1* 8.2*  --  8.4*  MG  --   < > 1.8 1.6* 2.1 2.1 2.0  --  2.0  PHOS  --   < > 3.4 2.7 3.2 3.3 2.5 3.9 3.6  < > = values in this interval not displayed. Estimated Creatinine Clearance: 96.4 mL/min (by C-G formula based on SCr of 1.09 mg/dL).   LIVER  Recent Labs Lab 04/30/16 1217 05/02/16 0432  AST 28 76*  ALT 29 44  ALKPHOS 117 190*  BILITOT 0.4 0.5  PROT 6.0* 5.6*  ALBUMIN 2.7* 2.2*  INR 1.10  --       INFECTIOUS  Recent Labs Lab 04/30/16 1233 04/30/16 1653 04/30/16 1718 05/01/16 0317 05/02/16 0432  LATICACIDVEN 2.04*  --  0.78  --   --   PROCALCITON  --  0.11  --  0.14 0.14     ENDOCRINE CBG (last 3)   Recent Labs  05/04/16 2357 05/05/16 0305 05/05/16 0734  GLUCAP 90 82 86    Culture     Imaging: CXR 12/10 reviewed> R pigtail chest tube in place, bilateral airspace disease, ett in place  STUDIES:  Bronchoscopy 12/7 > likely traumatic abrasion to trachea from ETT  DISCUSSION: 50 y/o M, group home resident, with PMH of mental delay & recent hospitalization for RLL PNA admitted 12/6 with diffuse infiltrates, hypothermia and concern for sepsis.  He self extubated on 12/9, but was then re-intubated in the setting of increased work of breathing and R lung white out on 12/11, had a pneumothorax post intubation.    ASSESSMENT / PLAN:  PULMONARY A: ARDS post pneumonia R lung atelectasis  R lung pneumothorax Hx Tracheostomy   P:   Full vent support VAP prevention  Pulmonary toilette > hypertonic saline  Needs Tracheostomy  CARDIOVASCULAR A:  Shock> sedation related? Volume related? Troponin Leak - suspect in setting of demand ischemia   P:  Stop diuresis Give saline bolus now> 1 L NS Tele monitoring Continue neo-synephrine for MAP > 65  RENAL A:   No acute issues P:   Monitor BMET and UOP Replace electrolytes as needed   GASTROINTESTINAL A:   H/O dysphagia - previously on D3 diet with honey thick liquids  P:   Continue tube feedings on 12/11 Famotidine for stress ulcer prophylaxis   HEMATOLOGIC  A:   Anemia - without bleeding Thrombocytopenia   P:  Trend CBC  Heparin for DVT prophylaxis  Monitor for bleeding  INFECTIOUS  CULTURES: 12/11 blood > 12/11 resp >   12/10 R pleural fluid > normal  12/7 BAL > neg 12/7 Bact > neg 12/7 Resp viral > neg  12/6 blood > neg 12/6 urine > neg  A:   Diffuse Bilateral  Infiltrates HCAP vs Aspiration PNA Recent PNA with Hospitalization  Fever shock, 12/11 > due to worsening  sepsis/HCAP? P:   ANTIBIOTICS: Cefepime 12/6 >> 12/8 Vanco 12/6 >> 12/8 Doxycycline 12/6 >> 12/8 Unasyn 12/8 > 12/11  Vanc 12/11 >  Mero 12/11 >   ENDOCRINE A:   No acute issues P:   Monitor glucose Q6 while NPO    NEUROLOGIC A:   Acute Metabolic Encephalopathy  Hx Mild MR (IQ 50-70) Hx Seizures, prior closed head injury, Bipolar Disorder, Anxiety   P:   RASS goal: 0 to -1  continue home cogentin, klonopin, lexapro, risperdal, carbamazepine and lamotrigine  Hold home trazodone, ambien  Fentanyl gtt and prn versed per PAD protocol   FAMILY  - Updates: Attempted to call mother, no answer.  Pt's caretaker [name] [phone] updated on status.  Group home - [facility] updated via phone 12/7.  Consent obtained for blood and FOB from [place] director of the group home.  She reports the mother prefers for them to help with medical care.  No answer from Mother via phone.   - no family 12/11; called [name] on 12/11 and called family on 12/11 but got no answer  - Inter-disciplinary family meet or Palliative Care meeting due by:  12/13   CC time 40 minutes  Roselie Awkward, MD [place] [facility] Pager: [phone] Cell: [phone] After 3pm or if no response, call [phone]   05/05/2016 11:29 AM

## 2016-05-05 NOTE — Progress Notes (Signed)
CPT held at this time.  RN aware.

## 2016-05-05 NOTE — Progress Notes (Signed)
CPT held due to chest tube placement.  RN aware.

## 2016-05-05 NOTE — Progress Notes (Signed)
Nutrition Follow-up  DOCUMENTATION CODES:   Not applicable  INTERVENTION:  - Will order 30 mL Prostat TID and Vital 1.5 @ 20 mL/hr to advance by 10 mL every 6 hours to reach goal rate of Vital 1.5 @ 50 mL/hr. At goal rate, this regimen will provide 2100 kcal (98% estimated kcal need), 126 grams of protein, and 917 mL free water.  - Will order 30 mL free water QID (120 mL/day).  - RD will follow-up 12/12.  NUTRITION DIAGNOSIS:   Inadequate oral intake related to inability to eat as evidenced by NPO status. -ongoing  GOAL:   Patient will meet greater than or equal to 90% of their needs -unmet with TF on hold since 12/9.  MONITOR:   TF tolerance, Vent status, Weight trends, Labs, Skin, I & O's  ASSESSMENT:   50 year old male with PMH of a traumatic brain injury with cognition difficulty whom lives in a group home, BPH, anxiety, and bipolar disorder;  presented 12/6 to ED from PCP with hypoxia, weakness and AMS. Per caretaker patient has not seemed the same since discharge on 12/1. Upon arrival to ED, patient rectal temp 89.2. CXR revealed progressed bilateral airspace disease with a right effusion. PAN cultures were sent and antibiotics started.  12/11 Pt with NGT in place. TF off at this time; spoke with RN and reviewed notes which indicate TF held starting 12/9 d/t vomiting. Pt self extubated 12/9 afternoon and respiratory status declined with failed BiPAP use and pt was subsequently re-intubated 12/10 afternoon. RN reports that she has been flushing tube and providing medications via NGT and has had no issues and no further vomiting since 12/9. Estimated nutrition needs updated this AM as weight now -2.9 kg from admission.   Spoke with Dr. Lake Bells about re-starting TF today. Plan outlined above and will communicate this with RN.   Patient is currently intubated on ventilator support MV: 5.5 L/min Temp (24hrs), Avg:100.7 F (38.2 C), Min:99 F (37.2 C), Max:101.7 F (38.7  C) Propofol: none BP: 94/43 and MAP: 60.   Medications reviewed; 20 mg Pepcid per NGT BID, 40 mg IV Lasix/day, sliding scale Novolog, PRN IV Zofran, 40 mEq KCl per NGT x1 dose yesterday.  Labs reviewed; Cl: 98 mmol/L, Ca: 8.4 mg/dL.  Drips: Fentanyl @ 50 mcg/hr, Neo @ 100 mcg/min.    12/8 - Pt with OGT and currently receiving TF at ordered goal rate: Vital 1.5 @ 50 mL/hr with 30 mL Prostat TID and 50 mL free water QID. - This regimen is providing 2100 kcal (96% re-estimated kcal need), 126 grams of protein, and 1117 mL free water.  - Estimated kcal need updated this AM based on current Tmax and Ve; continue to use weight from 11/29 (88 kg) to estimate needs. - Weight now beginning to trend down and is -2.6 kg from yesterday.  - Spoke with RN who reports good tolerance of TF.  - Spoke with Richardson Landry, PCCM NP, on the phone and plan is to change IVF to no longer contain dextrose now that TF at goal rate.  - Physical assessment shows no muscle and no fat wasting, moderate edema present.   Patient is currently intubated on ventilator support MV: 11.6 L/min Temp (24hrs), Avg:99 F (37.2 C), Min:97.9 F (36.6 C), Max:99.9 F (37.7 C) Propofol: none IVF: D5-NS @ 100 mL/hr (408 kcal; plan per PCCM NP is to change this this afternoon) Drip: Nep @ 10 mcg/min.    12/7 - PMH outlined above  from H&P; no documented hx of DM.  - No family/visitors present at bedside at this time.  - Pt was intubated yesterday PM and OGT placed at that time.  - Per chart review, weight +7 kg since 11/29 so will use weight from that time (88 kg) to estimate nutrition needs.  - Will continue to monitor weight trends closely and will adjust as needed.  - Unable to perform physical assessment at this time as tech in room to perform EKG.  - Will complete physical assessment and document findings at that time.  - RN assessment indicates pt with mild to moderate edema throughout.  - Dr. Corrie Dandy reports possible  bronchoscopy later today.  - RN reports plan for TF to start this evening.  - H&P indicates pt with hx of dysphagia and was previous on a dysphagia 3 diet with honey-thick liquids.   Patient is currently intubated on ventilator support MV: 9.8L/min Temp (24hrs), Avg:98.4 F (36.9 C), Min:90.4 F (32.4 C), Max:99.9 F (37.7 C) Propofol: none BP: 99/77 and MAP: 83 IVF:D5-1/2 NS @ 100 mL/hr (306 kcal).  Drip: Neo @ 10 mcg/min.    Diet Order:  Diet NPO time specified  Skin:  Wound (see comment) (Stage 1 L ankle pressure injury)  Last BM:  12/10  Height:   Ht Readings from Last 1 Encounters:  04/30/16 6\' 2"  (1.88 m)    Weight:   Wt Readings from Last 1 Encounters:  05/05/16 187 lb 9.8 oz (85.1 kg)    Ideal Body Weight:  86.36 kg  BMI:  Body mass index is 24.09 kg/m.  Estimated Nutritional Needs:   Kcal:  2144  Protein:  102-128 grams (1.2-1.5 grams/kg)  Fluid:  1.8-2 L/day  EDUCATION NEEDS:   No education needs identified at this time    Jarome Matin, MS, RD, LDN, CNSC Inpatient Clinical Dietitian Pager # [phone] After hours/weekend pager # [phone]

## 2016-05-05 NOTE — Progress Notes (Addendum)
Pharmacy Antibiotic Note  [name] is a 50 y.o. male admitted on 04/30/2016 with pneumonia.  Pharmacy currently consulted for Unasyn dosing but PCCM escalating antibiotics to vancomycin and meropenem 12/11 for ongoing fever.   Day #6 total antibiotic therapy Today, 05/05/2016  Remains febrile  WBC WNL  Bump in SCr but remains WNL - UOP appears OK  Plan:  Meropenem 1gm IV q8h  Vancomycin 1250mg  IV q12h based on recent vancomycin   Watch SCr  - ? significance of small bump this am, UOP OK  Check trough if remains on vancomycin > 72h or worsening renal fx  Height: 6\' 2"  (188 cm) Weight: 187 lb 9.8 oz (85.1 kg) IBW/kg (Calculated) : 82.2  Temp (24hrs), Avg:100.7 F (38.2 C), Min:99 F (37.2 C), Max:101.7 F (38.7 C)   Recent Labs Lab 04/30/16 1233 04/30/16 1653 04/30/16 1718 05/01/16 0317 05/02/16 0432 05/03/16 0617 05/05/16 0350  WBC  --  4.5  --  4.0 5.7 5.3 8.0  CREATININE  --  0.73  --  0.94 0.98 0.86 1.09  LATICACIDVEN 2.04*  --  0.78  --   --   --   --   VANCOTROUGH  --   --   --   --  28*  --   --     Estimated Creatinine Clearance: 96.4 mL/min (by C-G formula based on SCr of 1.09 mg/dL).    Allergies  Allergen Reactions  . Pineapple Anaphylaxis   Antimicrobials this admission:  12/6 Cefepime >> 12/8 12/6 Vancomycin >> 12/8, 12/11 12/6 doxycyline (MD)>> 12/8 12/8 Unasyn >> 12/11 12/11 meropenem >>   Dose adjustments this admission:  12/8 0530 VT = 28 mcg/ml on 1gm IV q8h (prior to 6th dose)--change to 1250mg  iv q12hr  Microbiology results:  12/7 BAL: ng-final 12/7 BAL fungus cx: no fungus observed/stain, pending 12/6 BCx: ngtd 12/6 UCx: ng-final 12/7 Resp virus panel: neg 12/6 Resp panel PCR: neg 11/29 MRSA PCR: neg 12/10 R pleural fluid: pending  Thank you for allowing pharmacy to be a part of this patient's care.  Doreene Eland, PharmD, BCPS.   Pager: [phone] 05/05/2016 11:00 AM

## 2016-05-05 NOTE — Progress Notes (Signed)
Date:  May 05, 2016 Chart reviewed for concurrent status and case management needs. Will continue to follow patient progress. Remains intubated and on full vent support. Discharge Planning: following for needs Expected discharge date: [id number] Velva Harman, BSN, [place], [place]

## 2016-05-06 ENCOUNTER — Inpatient Hospital Stay (HOSPITAL_COMMUNITY): Payer: Medicare Other

## 2016-05-06 LAB — BASIC METABOLIC PANEL
Anion gap: 5 (ref 5–15)
BUN: 18 mg/dL (ref 6–20)
CALCIUM: 8.1 mg/dL — AB (ref 8.9–10.3)
CHLORIDE: 99 mmol/L — AB (ref 101–111)
CO2: 34 mmol/L — AB (ref 22–32)
CREATININE: 0.95 mg/dL (ref 0.61–1.24)
GFR calc non Af Amer: >60 mL/min (ref >60)
GLUCOSE: 196 mg/dL — AB (ref 65–99)
Potassium: 3.6 mmol/L (ref 3.5–5.1)
Sodium: 138 mmol/L (ref 135–145)

## 2016-05-06 LAB — GLUCOSE, CAPILLARY
GLUCOSE-CAPILLARY: 106 mg/dL — AB (ref 65–99)
Glucose-Capillary: 98 mg/dL (ref 65–99)
Glucose-Capillary: 108 mg/dL — ABNORMAL HIGH (ref 65–99)
Glucose-Capillary: 109 mg/dL — ABNORMAL HIGH (ref 65–99)

## 2016-05-06 LAB — CBC
HCT: 27.1 % — ABNORMAL LOW (ref 39.0–52.0)
HEMOGLOBIN: 8.1 g/dL — AB (ref 13.0–17.0)
MCH: 27.9 pg (ref 26.0–34.0)
MCHC: 29.9 g/dL — AB (ref 30.0–36.0)
MCV: 93.4 fL (ref 78.0–100.0)
PLATELETS: 390 10*3/uL (ref 150–400)
RBC: 2.9 MIL/uL — AB (ref 4.22–5.81)
RDW: 17.3 % — ABNORMAL HIGH (ref 11.5–15.5)
WBC: 5.9 10*3/uL (ref 4.0–10.5)

## 2016-05-06 LAB — PHOSPHORUS: PHOSPHORUS: 3.1 mg/dL (ref 2.5–4.6)

## 2016-05-06 LAB — MAGNESIUM: Magnesium: 2.1 mg/dL (ref 1.7–2.4)

## 2016-05-06 NOTE — Progress Notes (Signed)
PULMONARY / CRITICAL CARE MEDICINE   Name: [name] MRN: [id number] DOB: 1965-10-02    ADMISSION DATE:  04/30/2016 CONSULTATION DATE:  04/30/2016  REFERRING MD:  Dr. Kathrynn Humble   CHIEF COMPLAINT: Hypoxia, weakness, AMS   BRIEF SUMMARY:  50 y/o M with PMH of MR (baseline lives at group home, IQ ~ 50-70) and prior tracheostomy, with recent admit for RLL PNA.  Patient treated and discharged on levaquin.  Found 12/6 altered, hypothermic, anemic with diffuse bilateral infiltrates.  Required intubation in ER.     SIGNIFICANT EVENTS: 11/28-12/1 > Admission for CAP vs Aspiration PNA > D/C with 7 day course of Levaquin  ...................... 12/6 - readmit CXR 12/6 >> Severe diffuse bilateral airspace disease. Small right effusion  ECHO 12/6 >> ef 60% 12/8 - RN reports bloody secretions from ETT.  Pt did not receive blood overnight due to consent issues.  Hgb down to 6.4.  Neo weaned to 15 mcg's.  12/9 - doinb sbt. No hemoptysis. On no sedation gtt -> calm and follows some commands. On Neo stil @ 16mcg . Diursed from 4L positive to 2L +. Fever curve better 12/10 - self extubated yesterdaty. Remain on neo. Still needing face mask o2. Diuresing. Follows commands but hypoactive delirium + per RN. Now 4L negative. Vomited 12/10 on NG to LIS without problems; re-intubated by anesthesia, R lung pneumothorax, chest tube placed   SUBJECTIVE/OVERNIGHT/INTERVAL HX Fever overnight Bradycardia WBC down   VITAL SIGNS: BP (!) 88/42   Pulse (!) 49   Temp (!) 100.6 F (38.1 C)   Resp 10   Ht 6\' 2"  (1.88 m)   Wt 182 lb 5.1 oz (82.7 kg)   SpO2 96%   BMI 23.41 kg/m   HEMODYNAMICS:    VENTILATOR SETTINGS: Vent Mode: PRVC FiO2 (%):  [30 %-40 %] 30 % Set Rate:  [10 bmp] 10 bmp Vt Set:  [580 mL] 580 mL PEEP:  [5 cmH20] 5 cmH20 Plateau Pressure:  [11 cmH20-17 cmH20] 17 cmH20  INTAKE / OUTPUT: I/O last 3 completed shifts: In: 3039.6 [I.V.:1629.6; NG/GT:560; IV Piggyback:850] Out: [id number]  [Urine:3840; Emesis/NG output:200; Chest Tube:225]  PHYSICAL EXAMINATION: General:  Comfortable on vent HENT: NCAT, trach scar noted, ETT in place PULM: crackles, diminished breath sounds R base CV: RRR, no mgr GI: BS+, soft, nontender MSK: normal bulk and tone Neuro: asleep, will wake to voice   LABS:  PULMONARY  Recent Labs Lab 05/01/16 0340 05/03/16 1808 05/04/16 1100 05/04/16 1640 05/04/16 1830  PHART 7.478* 7.414 7.450 7.626* 7.485*  PCO2ART 39.5 55.4* 57.6* 34.7 50.8*  PO2ART 85.5 141* 75.8* 255* 163*  HCO3 28.7* 34.8* 39.4* 36.7* 37.8*  O2SAT 96.1 98.9 94.5 100.0 99.2    CBC  Recent Labs Lab 05/03/16 0617 05/05/16 0350 05/06/16 0355  HGB 7.6* 8.7* 8.1*  HCT 23.7* 27.2* 27.1*  WBC 5.3 8.0 5.9  PLT 120* 351 390    COAGULATION  Recent Labs Lab 04/30/16 1217  INR 1.10    CARDIAC    Recent Labs Lab 04/30/16 1217  TROPONINI 0.12*   No results for input(s): PROBNP in the last 168 hours.   CHEMISTRY  Recent Labs Lab 05/01/16 0317 05/01/16 1657 05/02/16 0432 05/03/16 0617 05/04/16 0427 05/05/16 0350 05/06/16 0355  NA 144  --  142 143  --  143 138  K 3.9  --  3.3* 3.3*  --  3.5 3.6  CL 109  --  107 106  --  98* 99*  CO2 28  --  30 31  --  35* 34*  GLUCOSE 71  --  96 124*  --  85 196*  BUN 12  --  16 16  --  16 18  CREATININE 0.94  --  0.98 0.86  --  1.09 0.95  CALCIUM 7.9*  --  8.1* 8.2*  --  8.4* 8.1*  MG 1.6* 2.1 2.1 2.0  --  2.0 2.1  PHOS 2.7 3.2 3.3 2.5 3.9 3.6 3.1   Estimated Creatinine Clearance: 110.6 mL/min (by C-G formula based on SCr of 0.95 mg/dL).   LIVER  Recent Labs Lab 04/30/16 1217 05/02/16 0432  AST 28 76*  ALT 29 44  ALKPHOS 117 190*  BILITOT 0.4 0.5  PROT 6.0* 5.6*  ALBUMIN 2.7* 2.2*  INR 1.10  --      INFECTIOUS  Recent Labs Lab 04/30/16 1233 04/30/16 1653 04/30/16 1718 05/01/16 0317 05/02/16 0432  LATICACIDVEN 2.04*  --  0.78  --   --   PROCALCITON  --  0.11  --  0.14 0.14      ENDOCRINE CBG (last 3)   Recent Labs  05/05/16 2320 05/06/16 0418 05/06/16 0730  GLUCAP 100* 98 108*    Culture     Imaging: CXR 12/10 reviewed> R pigtail chest tube in place, bilateral airspace disease, ett in place  STUDIES:  Bronchoscopy 12/7 > likely traumatic abrasion to trachea from ETT Echo 12/7 > normal LVEF 60-65%, Could not estimate RVSP  DISCUSSION: 50 y/o M, group home resident, with PMH of mental delay & recent hospitalization for RLL PNA admitted 12/6 with diffuse infiltrates, hypothermia and concern for sepsis.  He self extubated on 12/9, but was then re-intubated in the setting of increased work of breathing and R lung atelectasis and likely aspiration pneumonia on 12/11, had a pneumothorax post intubation.    ASSESSMENT / PLAN:  PULMONARY A: ARDS post pneumonia R lung atelectasis  R lung pneumothorax Hx Tracheostomy   P:   Full vent support VAP prevention  Pulmonary toilette > hypertonic saline  Needs Tracheostomy Continue chest tube, change to water seal 12/12 WUA/SBT daily  CARDIOVASCULAR A:  Hypotension, mild> suspect sedation related as urine output adequate  Troponin Leak - suspect in setting of demand ischemia   P:  Monitor I/O Tele monitoring Continue neo-synephrine for MAP > 65  RENAL A:   No acute issues P:   Monitor BMET and UOP Replace electrolytes as needed   GASTROINTESTINAL A:   H/O dysphagia - previously on D3 diet with honey thick liquids  P:   Continue tube feedings on 12/11 Famotidine for stress ulcer prophylaxis   HEMATOLOGIC  A:   Anemia - without bleeding Thrombocytopenia  P:  Trend CBC  Heparin for DVT prophylaxis  Monitor for bleeding  INFECTIOUS  CULTURES: 12/11 blood > 12/11 resp >   12/10 R pleural fluid > normal  12/7 BAL > neg 12/7 Bact > neg 12/7 Resp viral > neg  12/6 blood > neg 12/6 urine > neg  A:   HCAP vs Aspiration PNA Recent PNA with Hospitalization  Fever  shock, 12/11 > due to worsening sepsis/HCAP? P:   ANTIBIOTICS: Cefepime 12/6 >> 12/8 Vanco 12/6 >> 12/8 Doxycycline 12/6 >> 12/8 Unasyn 12/8 > 12/11  Vanc 12/11 >  Mero 12/11 >   ENDOCRINE A:   No acute issues P:   Monitor glucose Q6 while NPO    NEUROLOGIC A:   Acute Metabolic Encephalopathy  Hx Mild MR (IQ 50-70)  Hx Seizures, prior closed head injury, Bipolar Disorder, Anxiety   P:   RASS goal: 0 to -1  continue home cogentin, klonopin, lexapro, risperdal, carbamazepine and lamotrigine  Hold home trazodone, ambien  Fentanyl gtt and prn versed per PAD protocol   FAMILY  - Updates: Attempted to call mother, no answer.  Pt's caretaker [name] [phone] updated on status.  Group home - [facility] updated via phone 12/7.  Consent obtained for blood and FOB from [place] director of the group home.  She reports the mother prefers for them to help with medical care.  No answer from Mother via phone.  - no family 12/11; called [name] on 12/11 and called family on 12/11 but got no answer - no family 12/12  - Inter-disciplinary family meet or Palliative Care meeting due by:  12/13   CC time 31 minutes  Roselie Awkward, MD [place] PCCM Pager: [phone] Cell: [phone] After 3pm or if no response, call [phone]   05/06/2016 10:00 AM

## 2016-05-06 NOTE — Progress Notes (Signed)
CSW following to assist with d/c planning. Pt continues to require vent support. CSW will continue to follow to assist with d/c planning needs.  Werner Lean LCSW [phone]

## 2016-05-06 NOTE — Progress Notes (Signed)
Witnessed [place] 38ml of fent from continuous fent gtt (2557mcg in 249ml). Medication expired.   [name]

## 2016-05-06 NOTE — Progress Notes (Signed)
[facility] ADULT ICU REPLACEMENT PROTOCOL FOR AM LAB REPLACEMENT ONLY  The patient does not apply for the [facility] Adult ICU Electrolyte Replacment Protocol based on the criteria listed below:     Is urine output >/= 0.5 ml/kg/hr for the last 6 hours? no Patient's UOP is 0 ml/kg/hr  Abnormal electrolyte(s): K3.6  6. If a panic level lab has been reported, has the CCM MD in charge been notified? Yes.  .   Physician:  Franco Collet, MD  Vear Clock 05/06/2016 5:29 AM

## 2016-05-06 NOTE — Progress Notes (Signed)
Jerl Santos, RN witnessed Tarri Abernethy, RN waste 50 cc's of Fentanyl 2500 mcg in NS 250 ml bag in sink. Drip expired.

## 2016-05-06 NOTE — Progress Notes (Signed)
Nutrition Follow-up  DOCUMENTATION CODES:   Not applicable  INTERVENTION:  - Continue to advance to goal TF: Vital 1.5 @ 50 mL/hr with 30 mL Prostat TID and 30 mL free water QID. - RD will follow-up 12/13.  NUTRITION DIAGNOSIS:   Inadequate oral intake related to inability to eat as evidenced by NPO status. -ongoing  GOAL:   Patient will meet greater than or equal to 90% of their needs -will be met with TF at goal rate.   MONITOR:   TF tolerance, Vent status, Weight trends, Labs, Skin, I & O's  ASSESSMENT:   50 year old male with PMH of a traumatic brain injury with cognition difficulty whom lives in a group home, BPH, anxiety, and bipolar disorder;  presented 12/6 to ED from PCP with hypoxia, weakness and AMS. Per caretaker patient has not seemed the same since discharge on 12/1. Upon arrival to ED, patient rectal temp 89.2. CXR revealed progressed bilateral airspace disease with a right effusion. PAN cultures were sent and antibiotics started.  12/12 Pt continues with NGT and TF held at time of RD visit earlier this AM. Spoke with RN who reports that TF was at 40 mL/hr prior to TF hold and pt appeared to be tolerating well. She states TF held for possible extubation but if pt unable to be extubated TF will be restarted. Weight -2.4 kg from yesterday and estimated nutrition needs updated accordingly.    Reviewed Dr. Anastasia Pall note from this AM which indicates plan to continue full vent support and that pt will require trach. Goal rate for TF outlined above and will provide 2100 kcal (97% re-estimated kcal need), 126 grams of protein, and 917 mL free water.  Patient is currently intubated on ventilator support MV: 5.8 L/min Temp (24hrs), Avg:101.1 F (38.4 C), Min:100.2 F (37.9 C), Max:102 F (38.9 C) Propofol: none BP: 91/49 and MAP: 58.  Medications reviewed; 20 mg Pepcid per NGT BID, sliding scale Novolog, PRN IV Zofran. Labs reviewed; CBGs: 98 and 108 mg/dL today, Cl: 99  mmol/L, Ca: 8.1 mg/dL.  Drip: Neo @ 90 mcg/min.    12/11 - Pt with NGT in place.  - TF off at this time; spoke with RN and reviewed notes which indicate TF held starting 12/9 d/t vomiting.  - Pt self extubated 12/9 afternoon and respiratory status declined with failed BiPAP use and pt was subsequently re-intubated 12/10 afternoon.  - RN reports that she has been flushing tube and providing medications via NGT and has had no issues and no further vomiting since 12/9.  - Estimated nutrition needs updated this AM as weight now -2.9 kg from admission.  - Spoke with Dr. Lake Bells about re-starting TF today.   Patient is currently intubated on ventilator support MV: 5.5 L/min Temp (24hrs), Avg:100.7 F (38.2 C), Min:99 F (37.2 C), Max:101.7 F (38.7 C) Propofol: none BP: 94/43 and MAP: 60 Drips: Fentanyl @ 50 mcg/hr, Neo @ 100 mcg/min.    12/8 - Pt with OGT and currently receiving TF at ordered goal rate: Vital 1.5 @ 50 mL/hr with 30 mL Prostat TID and 50 mL free water QID. - This regimen is providing 2100 kcal (96% re-estimated kcal need), 126 grams of protein, and 1117 mL free water.  - Estimated kcal need updated this AM based on current Tmax and Ve; continue to use weight from 11/29 (88 kg) to estimate needs. - Weight now beginning to trend down and is -2.6 kg from yesterday.  - Spoke  with RN who reports good tolerance of TF.  - Spoke with Richardson Landry, PCCM NP, on the phone and plan is to change IVF to no longer contain dextrose now that TF at goal rate.  - Physical assessment shows no muscle and no fat wasting, moderate edema present.   Patient is currently intubated on ventilator support MV: 11.6L/min Temp (24hrs), Avg:99 F (37.2 C), Min:97.9 F (36.6 C), Max:99.9 F (37.7 C) Propofol: none IVF: D5-NS @ 100 mL/hr (408 kcal; plan per PCCM NP is to change this this afternoon) Drip:Nep @ 10 mcg/min.    Diet Order:  Diet NPO time specified  Skin:  Wound (see comment)  (Stage 1 L ankle pressure injury)  Last BM:  12/10  Height:   Ht Readings from Last 1 Encounters:  04/30/16 6' 2"  (1.88 m)    Weight:   Wt Readings from Last 1 Encounters:  05/06/16 182 lb 5.1 oz (82.7 kg)    Ideal Body Weight:  86.36 kg  BMI:  Body mass index is 23.41 kg/m.  Estimated Nutritional Needs:   Kcal:  2163  Protein:  107-124 grams (1.3-1.5 grams/kg)  Fluid:  1.8-2 L/day  EDUCATION NEEDS:   No education needs identified at this time    Jarome Matin, MS, RD, LDN, CNSC Inpatient Clinical Dietitian Pager # [phone] After hours/weekend pager # [phone]

## 2016-05-07 ENCOUNTER — Inpatient Hospital Stay (HOSPITAL_COMMUNITY): Payer: Medicare Other

## 2016-05-07 LAB — GLUCOSE, CAPILLARY
GLUCOSE-CAPILLARY: 125 mg/dL — AB (ref 65–99)
GLUCOSE-CAPILLARY: 97 mg/dL (ref 65–99)
Glucose-Capillary: 112 mg/dL — ABNORMAL HIGH (ref 65–99)
Glucose-Capillary: 129 mg/dL — ABNORMAL HIGH (ref 65–99)
Glucose-Capillary: 111 mg/dL — ABNORMAL HIGH (ref 65–99)
Glucose-Capillary: 97 mg/dL (ref 65–99)
Glucose-Capillary: 100 mg/dL — ABNORMAL HIGH (ref 65–99)
Glucose-Capillary: 125 mg/dL — ABNORMAL HIGH (ref 65–99)

## 2016-05-07 LAB — MAGNESIUM: Magnesium: 2.1 mg/dL (ref 1.7–2.4)

## 2016-05-07 LAB — BASIC METABOLIC PANEL
Anion gap: 6 (ref 5–15)
BUN: 18 mg/dL (ref 6–20)
CALCIUM: 8.4 mg/dL — AB (ref 8.9–10.3)
CHLORIDE: 102 mmol/L (ref 101–111)
CO2: 31 mmol/L (ref 22–32)
CREATININE: 0.76 mg/dL (ref 0.61–1.24)
GFR calc Af Amer: >60 mL/min (ref >60)
GFR calc non Af Amer: >60 mL/min (ref >60)
GLUCOSE: 176 mg/dL — AB (ref 65–99)
Potassium: 3.5 mmol/L (ref 3.5–5.1)
Sodium: 139 mmol/L (ref 135–145)

## 2016-05-07 LAB — CBC
HCT: 28.4 % — ABNORMAL LOW (ref 39.0–52.0)
HEMOGLOBIN: 8.6 g/dL — AB (ref 13.0–17.0)
MCH: 28.4 pg (ref 26.0–34.0)
MCHC: 30.3 g/dL (ref 30.0–36.0)
MCV: 93.7 fL (ref 78.0–100.0)
PLATELETS: 447 10*3/uL — AB (ref 150–400)
RBC: 3.03 MIL/uL — ABNORMAL LOW (ref 4.22–5.81)
RDW: 17.3 % — ABNORMAL HIGH (ref 11.5–15.5)
WBC: 7 10*3/uL (ref 4.0–10.5)

## 2016-05-07 LAB — CORTISOL: CORTISOL PLASMA: 18.6 ug/dL

## 2016-05-07 LAB — CULTURE, RESPIRATORY W GRAM STAIN

## 2016-05-07 LAB — CULTURE, RESPIRATORY
CULTURE: NO GROWTH
SPECIAL REQUESTS: NORMAL

## 2016-05-07 LAB — PHOSPHORUS: PHOSPHORUS: 2.8 mg/dL (ref 2.5–4.6)

## 2016-05-07 LAB — PROCALCITONIN: PROCALCITONIN: 0.12 ng/mL

## 2016-05-07 MED ORDER — POTASSIUM CHLORIDE 20 MEQ/15ML (10%) PO SOLN
20.0000 meq | ORAL | Status: AC
  Administered 2016-05-07 (×2): 20 meq
  Filled 2016-05-07 (×2): qty 15

## 2016-05-07 NOTE — Progress Notes (Signed)
Nutrition Follow-up  DOCUMENTATION CODES:   Not applicable  INTERVENTION:  - Continue Vital 1.5 @ 50 mL/hr with 30 mL Prostat BID and 30 mL free water QID.  - PEPuP protocol to be initiated today. - RD will follow-up 12/15.  NUTRITION DIAGNOSIS:   Inadequate oral intake related to inability to eat as evidenced by NPO status. -ongoing  GOAL:   Patient will meet greater than or equal to 90% of their needs -met with current TF regimen.   MONITOR:   TF tolerance, Vent status, Weight trends, Labs, Skin, I & O's  ASSESSMENT:   50 year old male with PMH of a traumatic brain injury with cognition difficulty whom lives in a group home, BPH, anxiety, and bipolar disorder;  presented 12/6 to ED from PCP with hypoxia, weakness and AMS. Per caretaker patient has not seemed the same since discharge on 12/1. Upon arrival to ED, patient rectal temp 89.2. CXR revealed progressed bilateral airspace disease with a right effusion. PAN cultures were sent and antibiotics started.  12/13 Pt continues with NGT and receiving TF at goal rate: Vital 1.5 @ 50 mL/hr with 30 mL Prostat BID with 30 mL free water QID which provides 2100 kcal (101.5% re-estimated kcal need), 126 grams of protein, and 917 mL free water. Weight stable from yesterday but kcal need slightly adjusted based on current Ve and Tmax. RN reports pt tolerating goal TF without any observed issues. Will continue to monitor tolerance in light of current BP and MAP.   Patient is currently intubated on ventilator support MV: 7 L/min Temp (24hrs), Avg:99.9 F (37.7 C), Min:98.8 F (37.1 C), Max:100.9 F (38.3 C) BP:93/47 and MAP: 61.   Medications reviewed; 20 mg Pepcid per NGT BID, sliding scale Novolog, PRN IV Zofran, 20 mEq KCl per NGT x2 doses today. Labs reviewed; CBG: 112 mg/dL this AM, Ca: 8.4 mg/dL.  Drip: Neo @ 70 mcg/min.     12/12 - Pt continues with NGT and TF held at time of RD visit earlier this AM.  - Spoke with RN who  reports that TF was at 40 mL/hr prior to TF hold and pt appeared to be tolerating well.  - She states TF held for possible extubation but if pt unable to be extubated TF will be restarted. - Weight -2.4 kg from yesterday and estimated nutrition needs updated accordingly.   - Reviewed Dr. Anastasia Pall note from this AM which indicates plan to continue full vent support and that pt will require trach.  - Goal rate for TF outlined above and will provide 2100 kcal (97% re-estimated kcal need), 126 grams of protein, and 917 mL free water.  Patient is currently intubated on ventilator support MV: 5.8 L/min Temp (24hrs), Avg:101.1 F (38.4 C), Min:100.2 F (37.9 C), Max:102 F (38.9 C) Propofol: none BP: 91/49 and MAP: 58. Drip: Neo @ 90 mcg/min.    12/11 - Pt with NGT in place.  - TF off at this time; spoke with RN and reviewed notes which indicate TF held starting 12/9 d/t vomiting.  - Pt self extubated 12/9 afternoon and respiratory status declined with failed BiPAP use and pt was subsequently re-intubated 12/10 afternoon.  - RN reports that she has been flushing tube and providing medications via NGT and has had no issues and no further vomiting since 12/9.  - Estimated nutrition needs updated this AM as weight now -2.9 kg from admission.  - Spoke with Dr. Lake Bells about re-starting TF today.  Patient is currently intubated on ventilator support MV: 5.5L/min Temp (24hrs), Avg:100.7 F (38.2 C), Min:99 F (37.2 C), Max:101.7 F (38.7 C) Propofol: none BP: 94/43 and MAP: 60 Drips:Fentanyl @ 50 mcg/hr, Neo @ 100 mcg/min.   Diet Order:  Diet NPO time specified  Skin:  Wound (see comment) (Stage 1 L ankle pressure injury)  Last BM:  12/10  Height:   Ht Readings from Last 1 Encounters:  04/30/16 6' 2"  (1.88 m)    Weight:   Wt Readings from Last 1 Encounters:  05/07/16 182 lb 5.1 oz (82.7 kg)    Ideal Body Weight:  86.36 kg  BMI:  Body mass index is 23.41  kg/m.  Estimated Nutritional Needs:   Kcal:  2067  Protein:  107-124 grams (1.3-1.5 grams/kg)  Fluid:  1.8-2 L/day  EDUCATION NEEDS:   No education needs identified at this time    Jarome Matin, MS, RD, LDN, CNSC Inpatient Clinical Dietitian Pager # [phone] After hours/weekend pager # [phone]

## 2016-05-07 NOTE — Procedures (Signed)
Extubation Procedure Note  Patient Details:   Name: [name] DOB: 09/01/1965 MRN: [id number]   Airway Documentation:     Evaluation  O2 sats: stable throughout Complications: No apparent complications Patient did tolerate procedure well. Bilateral Breath Sounds: Clear, Diminished  Pt placed on 4 lpm B[place]l Yes  Kampbell Holaway L 05/07/2016, 10:30 AM
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
## 2016-05-07 NOTE — Progress Notes (Signed)
[facility] ADULT ICU REPLACEMENT PROTOCOL FOR AM LAB REPLACEMENT ONLY  The patient does apply for the [facility] Adult ICU Electrolyte Replacment Protocol based on the criteria listed below:   1. Is GFR >/= 40 ml/min? Yes.    Patient's GFR today is >60 2. Is urine output >/= 0.5 ml/kg/hr for the last 6 hours? Yes.   Patient's UOP is 1.3 ml/kg/hr 3. Is BUN < 60 mg/dL? Yes.    Patient's BUN today is 18 4. Abnormal electrolyte(s): K3.5 5. Ordered repletion with: per protocol 6. If a panic level lab has been reported, has the CCM MD in charge been notified? Yes.  .   Physician:  Elder Cyphers, MD   Vear Clock 05/07/2016 6:31 AM

## 2016-05-07 NOTE — Progress Notes (Signed)
eLink Physician-Brief Progress Note Patient Name: KADO CARITHERS DOB: 30-May-1965 MRN: [id number]   Date of Service  05/07/2016  HPI/Events of Note  Intermittent bradycardia - down to 45. Now 24.  eICU Interventions  Hold Respiradol at 10 PM if HR in 40's.     Intervention Category Major Interventions: Arrhythmia - evaluation and management  Musette Kisamore Eugene 05/07/2016, 8:43 PM

## 2016-05-08 ENCOUNTER — Inpatient Hospital Stay (HOSPITAL_COMMUNITY): Payer: Medicare Other

## 2016-05-08 DIAGNOSIS — R0602 Shortness of breath: Secondary | ICD-10-CM

## 2016-05-08 LAB — BASIC METABOLIC PANEL
Anion gap: 6 (ref 5–15)
BUN: 16 mg/dL (ref 6–20)
CHLORIDE: 103 mmol/L (ref 101–111)
CO2: 32 mmol/L (ref 22–32)
Calcium: 8.4 mg/dL — ABNORMAL LOW (ref 8.9–10.3)
Creatinine, Ser: 0.81 mg/dL (ref 0.61–1.24)
GFR calc non Af Amer: >60 mL/min (ref >60)
Glucose, Bld: 108 mg/dL — ABNORMAL HIGH (ref 65–99)
POTASSIUM: 3.9 mmol/L (ref 3.5–5.1)
Sodium: 141 mmol/L (ref 135–145)

## 2016-05-08 LAB — D-DIMER, QUANTITATIVE: D-Dimer, Quant: 0.98 ug/mL-FEU — ABNORMAL HIGH (ref 0.00–0.50)

## 2016-05-08 LAB — ECHOCARDIOGRAM COMPLETE
Height: 74 in
WEIGHTICAEL: 2850.11 [oz_av]

## 2016-05-08 LAB — TROPONIN I
Troponin I: <0.03 ng/mL (ref <0.03)
Troponin I: <0.03 ng/mL (ref <0.03)

## 2016-05-08 LAB — GLUCOSE, CAPILLARY
GLUCOSE-CAPILLARY: 92 mg/dL (ref 65–99)
GLUCOSE-CAPILLARY: 106 mg/dL — AB (ref 65–99)
GLUCOSE-CAPILLARY: 126 mg/dL — AB (ref 65–99)
GLUCOSE-CAPILLARY: 81 mg/dL (ref 65–99)
GLUCOSE-CAPILLARY: 93 mg/dL (ref 65–99)
Glucose-Capillary: 94 mg/dL (ref 65–99)

## 2016-05-08 LAB — BODY FLUID CULTURE
CULTURE: NO GROWTH
Special Requests: NORMAL

## 2016-05-08 LAB — CBC
HEMATOCRIT: 30.7 % — AB (ref 39.0–52.0)
HEMOGLOBIN: 9.2 g/dL — AB (ref 13.0–17.0)
MCH: 28.3 pg (ref 26.0–34.0)
MCHC: 30 g/dL (ref 30.0–36.0)
MCV: 94.5 fL (ref 78.0–100.0)
Platelets: 569 10*3/uL — ABNORMAL HIGH (ref 150–400)
RBC: 3.25 MIL/uL — AB (ref 4.22–5.81)
RDW: 17.8 % — ABNORMAL HIGH (ref 11.5–15.5)
WBC: 10.8 10*3/uL — AB (ref 4.0–10.5)

## 2016-05-08 LAB — MAGNESIUM: Magnesium: 2.1 mg/dL (ref 1.7–2.4)

## 2016-05-08 LAB — BRAIN NATRIURETIC PEPTIDE: B Natriuretic Peptide: 248.4 pg/mL — ABNORMAL HIGH (ref 0.0–100.0)

## 2016-05-08 LAB — PROCALCITONIN: Procalcitonin: 0.14 ng/mL

## 2016-05-08 MED ORDER — SODIUM CHLORIDE 0.9 % IV BOLUS (SEPSIS)
1000.0000 mL | Freq: Once | INTRAVENOUS | Status: AC
  Administered 2016-05-08: 1000 mL via INTRAVENOUS
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
## 2016-05-08 NOTE — Progress Notes (Signed)
VASCULAR LAB PRELIMINARY  PRELIMINARY  PRELIMINARY  PRELIMINARY  Bilateral lower extremity venous duplex completed.    Preliminary report:  Technically difficult and somewhat limited due to movement of involuntary movement of the legs. No obvious evidence of DVT, superficial thrombosis or Baker's cyst.  Corvin Sorbo, RVS 05/08/2016, 2:52 PM

## 2016-05-08 NOTE — Progress Notes (Signed)
Pharmacy Antibiotic Note  [name] is a 50 y.o. male admitted on 04/30/2016 with pneumonia.  Pharmacy currently consulted for Unasyn dosing but PCCM escalating antibiotics to vancomycin and meropenem 12/11 for ongoing fever.   12/14: D9 antibiotics D4 Merropenem -Vanc stopped yesterday -Noted plans to possibly narrow antibiotics today -Stable renal function -NGTD on cultures -Tm 99.9, WBC sl elevated  Plan:  Continue Meropenem 1gm IV q8h  F/u DOT and narrowing when able  Height: 6\' 2"  (188 cm) Weight: 178 lb 2.1 oz (80.8 kg) IBW/kg (Calculated) : 82.2  Temp (24hrs), Avg:100.5 F (38.1 C), Min:99.9 F (37.7 C), Max:101.1 F (38.4 C)   Recent Labs Lab 05/02/16 0432 05/03/16 0617 05/05/16 0350 05/06/16 0355 05/07/16 0441 05/08/16 0505  WBC 5.7 5.3 8.0 5.9 7.0 10.8*  CREATININE 0.98 0.86 1.09 0.95 0.76 0.81  VANCOTROUGH 28*  --   --   --   --   --     Estimated Creatinine Clearance: 127.5 mL/min (by C-G formula based on SCr of 0.81 mg/dL).    Allergies  Allergen Reactions  . Pineapple Anaphylaxis   Antimicrobials this admission:  12/6 Cefepime >> 12/8 12/6 Vancomycin >> 12/8, 12/11 >> 12/13 12/6 doxycyline (MD)>> 12/8 12/8 Unasyn >> 12/11 12/11 meropenem >>   Dose adjustments this admission:  12/8 0530 VT = 28 mcg/ml on 1gm IV q8h (prior to 6th dose)--change to 1250mg  iv q12hr  Microbiology results:  12/7 BAL: NGF 12/7 BAL fungus cx: no fungus observed/stain, pending 12/6 BCx: NGF 12/6 UCx: NGF 12/7 Resp virus panel: neg 12/6 Resp panel PCR: neg 11/29 MRSA PCR: neg 12/10 R pleural fluid: NGTD 12/11 Trach aspirate: NGF (no organisms seen on GS) 12/11 BCx: NGTD  Thank you for allowing pharmacy to be a part of this patient's care.  Ralene Bathe, PharmD, BCPS 05/08/2016, 10:05 AM  Pager: [phone]

## 2016-05-08 NOTE — Progress Notes (Signed)
Date:  May 08, 2016 Chart reviewed for concurrent status and case management needs. Will continue to follow patient progress. Discharge Planning: following for needs Expected discharge date: [id number] Rhonda Davis, BSN, RN3, CCM   [phone] 

## 2016-05-08 NOTE — Evaluation (Signed)
Clinical/Bedside Swallow Evaluation Patient Details  Name: [name] MRN: [id number] Date of Birth: 03-30-1966  Today's Date: 05/08/2016 Time: SLP Start Time (ACUTE ONLY): 1145 SLP Stop Time (ACUTE ONLY): 1153 SLP Time Calculation (min) (ACUTE ONLY): 8 min  Past Medical History:  Past Medical History:  Diagnosis Date  . Anxiety   . Bipolar disorder ([place])   . BPH (benign prostatic hyperplasia)   . Closed head injury   . Constipation   . Depression   . Esophageal ulcer   . Malnutrition ([place])   . 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 Peptic ulcer disease   . Seizures ([place])   . Smoker    Past Surgical History:  Past Surgical History:  Procedure Laterality Date  . ESOPHAGOGASTRODUODENOSCOPY  06/17/2011   Procedure: ESOPHAGOGASTRODUODENOSCOPY (EGD);  Surgeon: Wonda Horner, MD;  Location: [facility];  Service: Endoscopy;  Laterality: N/A;  . GASTRECTOMY     HPI:  50 y/o M with PMH of MR (baseline lives at group home, IQ ~ 50-70) and prior tracheostomy, with recent admit for RLL PNA. Patient treated and discharged on levaquin. Found 12/6 altered, hypothermic, anemic with diffuse bilateral infiltrates. Required intubation in ER. Self extubated 12/9, re-intubated until 12/13. During prior admission pt seen by SLP, resumed diet of Dys 3/nectar thick liquids that pt consumes at group home with full supervision due to impulsivity.    Assessment / Plan / Recommendation Clinical Impression  Pt demonstrates baseline dysphagia with suspected aspiration pna as well as worsened function after 8 day intubation with a self extubation event. Pts voice is wet and hoarse and a large NG tube is present. SLP offered 3 teaspoons of nectar thick water with increased wet vocal quality. Pt unable to clear airway with max verbal cues. Overall, pt is not ready for PO intake given mutliple risk factors for aspiration. Pt  will need objective testing given suspected intolerance of baseline diet, but he is not ready for MBS today. Will need removal of NG for testing, or placement of small flexible tube for feeding. Will follow for readiness tomorrow.     Aspiration Risk  Severe aspiration risk    Diet Recommendation NPO        Other  Recommendations Oral Care Recommendations: Oral care QID   Follow up Recommendations Other (comment) (group home)      Frequency and Duration            Prognosis        Swallow Study   General HPI: 50 y/o M with PMH of MR (baseline lives at group home, IQ ~ 50-70) and prior tracheostomy, with recent admit for RLL PNA. Patient treated and discharged on levaquin. Found 12/6 altered, hypothermic, anemic with diffuse bilateral infiltrates. Required intubation in ER. Self extubated 12/9, re-intubated until 12/13. During prior admission pt seen by SLP, resumed diet of Dys 3/nectar thick liquids that pt consumes at group home with full supervision due to impulsivity.  Type of Study: Bedside Swallow Evaluation Previous Swallow Assessment: see HPI Diet Prior to this Study: NPO Temperature Spikes Noted: No Respiratory Status: Nasal cannula History of Recent Intubation: Yes Length of Intubations (days): 8 days Date extubated: 05/07/16 Behavior/Cognition: Alert;Cooperative Oral Care Completed by SLP: No Oral Cavity - Dentition: Adequate natural dentition Self-Feeding Abilities: Total assist Patient Positioning: Upright in bed Baseline Vocal Quality: Hoarse;Wet;Low vocal intensity Volitional Cough: Weak Volitional Swallow: Able to elicit  Oral/Motor/Sensory Function Overall Oral Motor/Sensory Function: Generalized oral weakness   Ice Chips Ice chips: Not tested   Thin Liquid Thin Liquid: Not tested    Nectar Thick Nectar Thick Liquid: Impaired Presentation: Spoon Pharyngeal Phase Impairments: Wet Vocal Quality   Honey Thick Honey Thick Liquid: Not tested   Puree  Puree: Not tested   Solid   GO   Solid: Not tested       Herbie Baltimore, MA CCC-SLP [id number]  Lavera Vandermeer, Katherene Ponto 05/08/2016,12:16 PM

## 2016-05-08 NOTE — Progress Notes (Signed)
eLink Physician-Brief Progress Note Patient Name: [name] DOB: 1966-04-30 MRN: [id number]   Date of Service  05/08/2016  HPI/Events of Note  Agitation - Pulled out Panda tube. Request for bilateral wrist restraints.  eICU Interventions  Will order: 1. Bilateral soft wrist restraints.      Intervention Category Minor Interventions: Agitation / anxiety - evaluation and management  Lysle Dingwall 05/08/2016, 8:55 PM

## 2016-05-08 NOTE — Progress Notes (Signed)
  Echocardiogram 2D Echocardiogram has been performed.  [name] 05/08/2016, 3:03 PM

## 2016-05-08 NOTE — Progress Notes (Signed)
Patient pulled out NG tube; had spoken to B. Ollis, NP earlier and she stated that we could put in a Panda Tube to make his swallowing evaluation easier.  We placed the Panda Tube and patient removed it with in minutes of it being placed. Attempted to place the [facility] again and patient's HR went into the 120's and his oxygen saturation dropped. Will wait and allow the patient to rest before trying to place another tube.

## 2016-05-09 ENCOUNTER — Inpatient Hospital Stay (HOSPITAL_COMMUNITY): Payer: Medicare Other

## 2016-05-09 LAB — BASIC METABOLIC PANEL
Anion gap: 7 (ref 5–15)
BUN: 13 mg/dL (ref 6–20)
CHLORIDE: 102 mmol/L (ref 101–111)
CO2: 28 mmol/L (ref 22–32)
Calcium: 8.4 mg/dL — ABNORMAL LOW (ref 8.9–10.3)
Creatinine, Ser: 0.8 mg/dL (ref 0.61–1.24)
Glucose, Bld: 111 mg/dL — ABNORMAL HIGH (ref 65–99)
POTASSIUM: 3.7 mmol/L (ref 3.5–5.1)
SODIUM: 137 mmol/L (ref 135–145)

## 2016-05-09 LAB — MAGNESIUM
MAGNESIUM: 2.1 mg/dL (ref 1.7–2.4)
MAGNESIUM: 2.2 mg/dL (ref 1.7–2.4)
Magnesium: 2.1 mg/dL (ref 1.7–2.4)

## 2016-05-09 LAB — CBC
HCT: 29.4 % — ABNORMAL LOW (ref 39.0–52.0)
Hemoglobin: 9 g/dL — ABNORMAL LOW (ref 13.0–17.0)
MCH: 28.6 pg (ref 26.0–34.0)
MCHC: 30.6 g/dL (ref 30.0–36.0)
MCV: 93.3 fL (ref 78.0–100.0)
PLATELETS: 640 10*3/uL — AB (ref 150–400)
RBC: 3.15 MIL/uL — AB (ref 4.22–5.81)
RDW: 18.1 % — ABNORMAL HIGH (ref 11.5–15.5)
WBC: 8.5 10*3/uL (ref 4.0–10.5)

## 2016-05-09 LAB — GLUCOSE, CAPILLARY
GLUCOSE-CAPILLARY: 143 mg/dL — AB (ref 65–99)
GLUCOSE-CAPILLARY: 97 mg/dL (ref 65–99)
Glucose-Capillary: 109 mg/dL — ABNORMAL HIGH (ref 65–99)
Glucose-Capillary: 118 mg/dL — ABNORMAL HIGH (ref 65–99)
Glucose-Capillary: 114 mg/dL — ABNORMAL HIGH (ref 65–99)

## 2016-05-09 LAB — PHOSPHORUS
PHOSPHORUS: 2.6 mg/dL (ref 2.5–4.6)
PHOSPHORUS: 3.3 mg/dL (ref 2.5–4.6)
Phosphorus: 2.6 mg/dL (ref 2.5–4.6)

## 2016-05-09 LAB — PROCALCITONIN: PROCALCITONIN: 0.16 ng/mL

## 2016-05-09 LAB — CORTISOL: Cortisol, Plasma: 22.7 ug/dL

## 2016-05-09 MED ORDER — SODIUM CHLORIDE 0.9 % IJ SOLN
INTRAMUSCULAR | Status: AC
  Filled 2016-05-09: qty 50

## 2016-05-09 MED ORDER — FREE WATER
100.0000 mL | Freq: Four times a day (QID) | Status: DC
  Administered 2016-05-09 – 2016-05-12 (×11): 100 mL

## 2016-05-09 MED ORDER — RESOURCE THICKENUP CLEAR PO POWD
ORAL | Status: DC | PRN
  Filled 2016-05-09: qty 125

## 2016-05-09 MED ORDER — MIDODRINE HCL 5 MG PO TABS: 5.0000 mg | ORAL_TABLET | Freq: Three times a day (TID) | ORAL | Status: DC

## 2016-05-09 MED ORDER — ORAL CARE MOUTH RINSE
15.0000 mL | Freq: Two times a day (BID) | OROMUCOSAL | Status: DC
  Administered 2016-05-09 – 2016-05-17 (×14): 15 mL via OROMUCOSAL

## 2016-05-09 MED ORDER — MIDODRINE HCL 5 MG PO TABS
5.0000 mg | ORAL_TABLET | Freq: Three times a day (TID) | ORAL | Status: DC
  Administered 2016-05-09 – 2016-05-11 (×9): 5 mg via ORAL
  Filled 2016-05-09 (×10): qty 1

## 2016-05-09 MED ORDER — PRO-STAT SUGAR FREE PO LIQD: 30.0000 mL | Freq: Two times a day (BID) | ORAL | Status: DC

## 2016-05-09 MED ORDER — HYDROCORTISONE NA SUCCINATE PF 100 MG IJ SOLR
50.0000 mg | Freq: Four times a day (QID) | INTRAMUSCULAR | Status: DC
  Administered 2016-05-09 – 2016-05-12 (×12): 50 mg via INTRAVENOUS
  Filled 2016-05-09 (×12): qty 2

## 2016-05-09 MED ORDER — JEVITY 1.2 CAL PO LIQD
1000.0000 mL | ORAL | Status: DC
  Administered 2016-05-09 – 2016-05-10 (×2): 1000 mL
  Administered 2016-05-11: 18:00:00
  Administered 2016-05-12: 1000 mL

## 2016-05-09 MED ORDER — IOPAMIDOL (ISOVUE-370) INJECTION 76%
INTRAVENOUS | Status: AC
  Administered 2016-05-09: 100 mL
  Administered 2016-05-09: 14:00:00
  Filled 2016-05-09: qty 100

## 2016-05-09 MED ORDER — VITAL HIGH PROTEIN PO LIQD
1000.0000 mL | ORAL | Status: DC
  Filled 2016-05-09: qty 1000

## 2016-05-09 MED ORDER — NOREPINEPHRINE BITARTRATE 1 MG/ML IV SOLN
2.0000 ug/min | INTRAVENOUS | Status: DC
  Administered 2016-05-09: 5 ug/min via INTRAVENOUS
  Administered 2016-05-09: 6 ug/min via INTRAVENOUS
  Administered 2016-05-10 (×3): 19 ug/min via INTRAVENOUS
  Administered 2016-05-11: 15 ug/min via INTRAVENOUS
  Administered 2016-05-11: 18 ug/min via INTRAVENOUS
  Administered 2016-05-11: 15 ug/min via INTRAVENOUS
  Administered 2016-05-11: 22 ug/min via INTRAVENOUS
  Administered 2016-05-11: 13 ug/min via INTRAVENOUS
  Administered 2016-05-12: 10 ug/min via INTRAVENOUS
  Administered 2016-05-12: 5 ug/min via INTRAVENOUS
  Filled 2016-05-09 (×13): qty 4
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
## 2016-05-09 NOTE — Progress Notes (Signed)
CSW continues to follow this pt from [facility] group [facility]. Pt has been extubated. Pt remains in ICU. Pt is not stable for d/c.  CSW will continue to follow to assist with d/c planning needs.  Werner Lean LCSW [phone]

## 2016-05-09 NOTE — Progress Notes (Signed)
Modified Barium Swallow Progress Note  Patient Details  Name: AMEN DEVINCENTIS MRN: [id number] Date of Birth: July 04, 1965  Today's Date: 05/09/2016  Modified Barium Swallow completed.  Full report located under Chart Review in the Imaging Section.  Brief recommendations include the following:  Clinical Impression  Pt demonstrates a moderate to severe oropharyngeal dysphagia. Oral phase is characterized by forward leaning posture with some anterior spillage, particularly of secretions. Pt transits bolus with lingual pumping with 50% of bolus remaining; pt clears with a second swallow indpendently. Liquids arrive to the pyriforms prior to swallow initaiton, leading to aspiration before and during the swallow with all liquid textures, even with a chin tuck. Sensation is late and cough is ineffective to clear. Pt tolerated purees in this study, though aspiration before the swallow is still possible due to severity of delay. Recommend initaiting a puree/pudding thick diet, no liquids though risk of aspiration of POs and secretions still high. Will follow for tolerance and diet advancement.   Swallow Evaluation Recommendations       SLP Diet Recommendations: Dysphagia 1 (Puree) solids;Pudding thick liquid   Liquid Administration via: Spoon   Medication Administration: Crushed with puree   Supervision: Full supervision/cueing for compensatory strategies   Compensations: Slow rate;Small sips/bites   Postural Changes:  (seated with head upright)   Oral Care Recommendations: Oral care BID   Other Recommendations: Order thickener from pharmacy;Have oral suction available   Herbie Baltimore, MA CCC-SLP [id number]  Lynann Beaver 05/09/2016,3:08 PM

## 2016-05-09 NOTE — Progress Notes (Signed)
Notified Elink of pt's HR of 38-39 while sleeping. Will continue to monitor.
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
## 2016-05-09 NOTE — Progress Notes (Signed)
[place] Progress Note Patient Name: [name] DOB: Oct 19, 1965 MRN: [id number]   Date of Service  05/09/2016  HPI/Events of Note  axr - panda in stomach somewhat coiled up  eICU Interventions  Ok to use panda     Intervention Category Intermediate Interventions: Diagnostic test evaluation  Kaytlen Lightsey 05/09/2016, 11:29 PM

## 2016-05-10 ENCOUNTER — Inpatient Hospital Stay (HOSPITAL_COMMUNITY): Payer: Medicare Other

## 2016-05-10 LAB — BASIC METABOLIC PANEL
Anion gap: 9 (ref 5–15)
BUN: 15 mg/dL (ref 6–20)
CALCIUM: 8.2 mg/dL — AB (ref 8.9–10.3)
CHLORIDE: 102 mmol/L (ref 101–111)
CO2: 30 mmol/L (ref 22–32)
CREATININE: 0.77 mg/dL (ref 0.61–1.24)
GFR calc non Af Amer: >60 mL/min (ref >60)
Glucose, Bld: 138 mg/dL — ABNORMAL HIGH (ref 65–99)
Potassium: 4.1 mmol/L (ref 3.5–5.1)
Sodium: 141 mmol/L (ref 135–145)

## 2016-05-10 LAB — PROTIME-INR
INR: 1.27
PROTHROMBIN TIME: 16 s — AB (ref 11.4–15.2)

## 2016-05-10 LAB — GLUCOSE, CAPILLARY
GLUCOSE-CAPILLARY: 124 mg/dL — AB (ref 65–99)
GLUCOSE-CAPILLARY: 127 mg/dL — AB (ref 65–99)
GLUCOSE-CAPILLARY: 136 mg/dL — AB (ref 65–99)
GLUCOSE-CAPILLARY: 144 mg/dL — AB (ref 65–99)
GLUCOSE-CAPILLARY: 163 mg/dL — AB (ref 65–99)
GLUCOSE-CAPILLARY: 163 mg/dL — AB (ref 65–99)
Glucose-Capillary: 168 mg/dL — ABNORMAL HIGH (ref 65–99)

## 2016-05-10 LAB — CBC
HCT: 29.1 % — ABNORMAL LOW (ref 39.0–52.0)
Hemoglobin: 9.1 g/dL — ABNORMAL LOW (ref 13.0–17.0)
MCH: 29.2 pg (ref 26.0–34.0)
MCHC: 31.3 g/dL (ref 30.0–36.0)
MCV: 93.3 fL (ref 78.0–100.0)
Platelets: 820 10*3/uL — ABNORMAL HIGH (ref 150–400)
RBC: 3.12 MIL/uL — ABNORMAL LOW (ref 4.22–5.81)
RDW: 19 % — AB (ref 11.5–15.5)
WBC: 9.3 10*3/uL (ref 4.0–10.5)

## 2016-05-10 LAB — CULTURE, BLOOD (ROUTINE X 2)
CULTURE: NO GROWTH
Culture: NO GROWTH

## 2016-05-10 LAB — MAGNESIUM
Magnesium: 2.4 mg/dL (ref 1.7–2.4)
Magnesium: 2.2 mg/dL (ref 1.7–2.4)

## 2016-05-10 LAB — PHOSPHORUS
PHOSPHORUS: 3.4 mg/dL (ref 2.5–4.6)
Phosphorus: 2.5 mg/dL (ref 2.5–4.6)

## 2016-05-10 MED ORDER — SODIUM CHLORIDE 0.9 % IV BOLUS (SEPSIS)
1000.0000 mL | Freq: Once | INTRAVENOUS | Status: AC
  Administered 2016-05-10: 1000 mL via INTRAVENOUS

## 2016-05-10 MED ORDER — METOCLOPRAMIDE HCL 5 MG/ML IJ SOLN
5.0000 mg | Freq: Four times a day (QID) | INTRAMUSCULAR | Status: DC
  Administered 2016-05-10 – 2016-05-12 (×9): 5 mg via INTRAVENOUS
  Filled 2016-05-10 (×9): qty 2

## 2016-05-10 MED ORDER — SODIUM CHLORIDE 0.9 % IV SOLN
250.0000 mL | INTRAVENOUS | Status: DC
  Administered 2016-05-10: 500 mL via INTRAVENOUS
  Administered 2016-05-10 – 2016-05-17 (×8): 250 mL via INTRAVENOUS

## 2016-05-10 MED ORDER — HEPARIN SODIUM (PORCINE) 5000 UNIT/ML IJ SOLN
5000.0000 [IU] | Freq: Three times a day (TID) | INTRAMUSCULAR | Status: DC
  Administered 2016-05-10 – 2016-05-17 (×23): 5000 [IU] via SUBCUTANEOUS
  Filled 2016-05-10 (×22): qty 1
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
## 2016-05-10 NOTE — Progress Notes (Addendum)
Earlier 05/09/16 PE ruled out by CTA Notes repote SQ heparin but not MAR Now post panda after TF patient got more hypoexmic -> CXR looks fairly clear Patient actually betteer after repositoning per RN and looks well on cam care  A Worsening hypoxemic - prob positioning related Also  Unclear why not on DVT proph SQ heparin - per RN who spoke to pharm - last dose was 04/30/16 (doa 04/30/2016) at admit when he had hgb < 7gm%. Platelets adequate since then. No HITT panel in records  p monitopr Hold panda feeds but meds ok Restart heparin 5K Q8h  Dr. Brand Males, M.D., [facility].C.P Pulmonary and Critical Care Medicine Staff Physician [place] Pulmonary and Critical Care Pager: [phone], If no answer or between  15:00h - 7:00h: call [phone]  05/10/2016 1:54 AM       Recent Labs Lab 05/07/16 0441 05/08/16 0505 05/09/16 0450  HGB 8.6* 9.2* 9.0*  HCT 28.4* 30.7* 29.4*  WBC 7.0 10.8* 8.5  PLT 447* 569* 640*    No results for input(s): INR in the last 168 hours.     Dg Abd 1 View  Result Date: 05/09/2016 CLINICAL DATA:  NG tube placement. EXAM: ABDOMEN - 1 VIEW COMPARISON:  None. FINDINGS: The bowel gas pattern is normal. Residual oral contrast throughout the abdomen. Feeding catheter is seen looping on itself in the expected location of gastric body/gastric cardia. IMPRESSION: Enteric catheter loops on itself with tip in the expected location of the gastric cardia. Nonobstructive bowel gas pattern. Electronically Signed   By: Fidela Salisbury M.D.   On: 05/09/2016 22:53   Ct Angio Chest Pe W Or Wo Contrast  Result Date: 05/09/2016 CLINICAL DATA:  Shortness of Breath EXAM: CT ANGIOGRAPHY CHEST WITH CONTRAST TECHNIQUE: Multidetector CT imaging of the chest was performed using the standard protocol during bolus administration of intravenous contrast. Multiplanar CT image reconstructions and MIPs were obtained to evaluate the vascular anatomy.  CONTRAST:  100 mL Omnipaque 300 COMPARISON:  None. FINDINGS: Cardiovascular: Thoracic aorta demonstrates mild calcification without aneurysmal dilatation. Pulmonary artery is well visualized and demonstrates a normal branching pattern. No filling defects to suggest pulmonary embolism are seen. A small air bubble is noted within the main pulmonary artery just above the pulmonic valve likely related to IV administration. Cardiac structures are mildly prominent. Mediastinum/Nodes: Thoracic inlet is within normal limits. No hilar or mediastinal lymphadenopathy is identified. Right PICC line is noted in satisfactory position. Lungs/Pleura: Small left pleural effusion is identified. No focal infiltrate is noted on the left. Small right pleural effusion is noted as well. Diffuse infiltrate is noted in the right upper lobe. Some slight nodularity is identified. Upper Abdomen: Feeding catheter is noted extending into the stomach. No acute abnormality in the upper abdomen is seen. Musculoskeletal: Within normal limits. Review of the MIP images confirms the above findings. IMPRESSION: No evidence of pulmonary embolism. Diffuse right upper lobe infiltrate with a somewhat nodular appearance. Short-term followup with noncontrast CT following appropriate therapy is recommended. Bilateral small pleural effusions. Electronically Signed   By: Inez Catalina M.D.   On: 05/09/2016 16:36   Dg Chest Port 1 View  Result Date: 05/10/2016 CLINICAL DATA:  49 y/o  M; acute on chronic respiratory failure. EXAM: PORTABLE CHEST 1 VIEW COMPARISON:  05/09/2016 chest radiograph and chest CT. FINDINGS: Ill-defined opacity in the right lung apex is better characterized on prior CT. Stable cardiac silhouette. Enteric tube tip below the  field of view in the abdomen. Right PICC line tip projects over the cavoatrial junction. No new focal airspace opacity. Bones are unremarkable. IMPRESSION: Stable ill-defined consolidation in the right upper lobe. No  new focal airspace opacity. Electronically Signed   By: Kristine Garbe M.D.   On: 05/10/2016 01:23   Dg Chest Port 1 View  Result Date: 05/09/2016 CLINICAL DATA:  Respiratory failure EXAM: PORTABLE CHEST 1 VIEW COMPARISON:  May 08, 2016 FINDINGS: Central catheter tip is at the cavoatrial junction. Feeding tube tip is below the diaphragm. No pneumothorax. There is a nondisplaced recently appearing fracture of the right posterior third rib. There old healed rib fractures on the left. There is patchy airspace consolidation in the left lower lobe. There is mild right base atelectasis. Lungs elsewhere are clear. Heart is upper normal in size with pulmonary vascularity within normal limits. No adenopathy. IMPRESSION: Patchy airspace disease left lower lobe, concerning for focal pneumonia. Mild right base atelectasis. Stable cardiac silhouette. Tube and catheter positions as described. No pneumothorax. Recent appearing fracture posterior right third rib. Electronically Signed   By: Lowella Grip III M.D.   On: 05/09/2016 07:06

## 2016-05-10 NOTE — Progress Notes (Signed)
Pt sats has decreased to low 80's after pt given meds per panda and restarted TF per orders.  RT called attempted to suction pt and pt started to vomit placed pt on venti mask, pt was on 3L [place].  TF stopped, consulted with [facility].  Awaiting any new orders.

## 2016-05-11 ENCOUNTER — Inpatient Hospital Stay (HOSPITAL_COMMUNITY): Payer: Medicare Other

## 2016-05-11 LAB — GLUCOSE, CAPILLARY
GLUCOSE-CAPILLARY: 160 mg/dL — AB (ref 65–99)
Glucose-Capillary: 155 mg/dL — ABNORMAL HIGH (ref 65–99)
Glucose-Capillary: 158 mg/dL — ABNORMAL HIGH (ref 65–99)
Glucose-Capillary: 158 mg/dL — ABNORMAL HIGH (ref 65–99)
Glucose-Capillary: 167 mg/dL — ABNORMAL HIGH (ref 65–99)
Glucose-Capillary: 174 mg/dL — ABNORMAL HIGH (ref 65–99)
Glucose-Capillary: 180 mg/dL — ABNORMAL HIGH (ref 65–99)

## 2016-05-11 LAB — CBC
HEMATOCRIT: 26 % — AB (ref 39.0–52.0)
HEMOGLOBIN: 8 g/dL — AB (ref 13.0–17.0)
MCH: 28.9 pg (ref 26.0–34.0)
MCHC: 30.8 g/dL (ref 30.0–36.0)
MCV: 93.9 fL (ref 78.0–100.0)
Platelets: 828 10*3/uL — ABNORMAL HIGH (ref 150–400)
RBC: 2.77 MIL/uL — ABNORMAL LOW (ref 4.22–5.81)
RDW: 19.3 % — ABNORMAL HIGH (ref 11.5–15.5)
WBC: 7.7 10*3/uL (ref 4.0–10.5)

## 2016-05-11 LAB — BASIC METABOLIC PANEL
Anion gap: 4 — ABNORMAL LOW (ref 5–15)
BUN: 12 mg/dL (ref 6–20)
CHLORIDE: 107 mmol/L (ref 101–111)
CO2: 30 mmol/L (ref 22–32)
Calcium: 8.2 mg/dL — ABNORMAL LOW (ref 8.9–10.3)
Creatinine, Ser: 0.8 mg/dL (ref 0.61–1.24)
GFR calc Af Amer: >60 mL/min (ref >60)
GLUCOSE: 179 mg/dL — AB (ref 65–99)
POTASSIUM: 3.6 mmol/L (ref 3.5–5.1)
Sodium: 141 mmol/L (ref 135–145)
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
## 2016-05-12 ENCOUNTER — Inpatient Hospital Stay (HOSPITAL_COMMUNITY): Payer: Medicare Other

## 2016-05-12 DIAGNOSIS — A419 Sepsis, unspecified organism: Principal | ICD-10-CM

## 2016-05-12 DIAGNOSIS — R6521 Severe sepsis with septic shock: Secondary | ICD-10-CM

## 2016-05-12 LAB — CBC
HCT: 25.3 % — ABNORMAL LOW (ref 39.0–52.0)
Hemoglobin: 7.8 g/dL — ABNORMAL LOW (ref 13.0–17.0)
MCH: 29.1 pg (ref 26.0–34.0)
MCHC: 30.8 g/dL (ref 30.0–36.0)
MCV: 94.4 fL (ref 78.0–100.0)
PLATELETS: 769 10*3/uL — AB (ref 150–400)
RBC: 2.68 MIL/uL — AB (ref 4.22–5.81)
RDW: 19.2 % — ABNORMAL HIGH (ref 11.5–15.5)
WBC: 7.4 10*3/uL (ref 4.0–10.5)

## 2016-05-12 LAB — GLUCOSE, CAPILLARY
GLUCOSE-CAPILLARY: 162 mg/dL — AB (ref 65–99)
GLUCOSE-CAPILLARY: 118 mg/dL — AB (ref 65–99)
GLUCOSE-CAPILLARY: 108 mg/dL — AB (ref 65–99)
Glucose-Capillary: 170 mg/dL — ABNORMAL HIGH (ref 65–99)
Glucose-Capillary: 104 mg/dL — ABNORMAL HIGH (ref 65–99)
Glucose-Capillary: 107 mg/dL — ABNORMAL HIGH (ref 65–99)

## 2016-05-12 LAB — BASIC METABOLIC PANEL
ANION GAP: 6 (ref 5–15)
BUN: 13 mg/dL (ref 6–20)
CHLORIDE: 106 mmol/L (ref 101–111)
CO2: 31 mmol/L (ref 22–32)
CREATININE: 0.66 mg/dL (ref 0.61–1.24)
Calcium: 8.1 mg/dL — ABNORMAL LOW (ref 8.9–10.3)
GFR calc non Af Amer: >60 mL/min (ref >60)
Glucose, Bld: 177 mg/dL — ABNORMAL HIGH (ref 65–99)
Potassium: 3.5 mmol/L (ref 3.5–5.1)
SODIUM: 143 mmol/L (ref 135–145)

## 2016-05-12 MED ORDER — DOCUSATE SODIUM 50 MG/5ML PO LIQD
100.0000 mg | Freq: Two times a day (BID) | ORAL | Status: DC
  Administered 2016-05-12: 100 mg
  Filled 2016-05-12 (×3): qty 10

## 2016-05-12 MED ORDER — MIDODRINE HCL 5 MG PO TABS
10.0000 mg | ORAL_TABLET | Freq: Three times a day (TID) | ORAL | Status: DC
  Administered 2016-05-12 – 2016-05-13 (×5): 10 mg
  Filled 2016-05-12 (×5): qty 2

## 2016-05-12 MED ORDER — HYDROCORTISONE NA SUCCINATE PF 100 MG IJ SOLR
50.0000 mg | Freq: Three times a day (TID) | INTRAMUSCULAR | Status: DC
  Administered 2016-05-12 – 2016-05-15 (×10): 50 mg via INTRAVENOUS
  Filled 2016-05-12 (×10): qty 2

## 2016-05-12 MED ORDER — CLONAZEPAM 0.5 MG PO TABS
0.5000 mg | ORAL_TABLET | Freq: Every day | ORAL | Status: DC
  Administered 2016-05-13: 0.5 mg
  Filled 2016-05-12 (×2): qty 1

## 2016-05-12 MED ORDER — SENNOSIDES 8.8 MG/5ML PO SYRP
5.0000 mL | ORAL_SOLUTION | Freq: Every day | ORAL | Status: DC
  Filled 2016-05-12 (×2): qty 5

## 2016-05-12 MED ORDER — ESCITALOPRAM OXALATE 20 MG PO TABS
10.0000 mg | ORAL_TABLET | Freq: Every day | ORAL | Status: DC
  Administered 2016-05-13: 10 mg
  Filled 2016-05-12: qty 1

## 2016-05-12 MED ORDER — FAMOTIDINE 20 MG PO TABS
20.0000 mg | ORAL_TABLET | Freq: Two times a day (BID) | ORAL | Status: DC
  Administered 2016-05-13 (×2): 20 mg
  Filled 2016-05-12 (×3): qty 1

## 2016-05-12 MED ORDER — BENZTROPINE MESYLATE 0.5 MG PO TABS
0.5000 mg | ORAL_TABLET | Freq: Every day | ORAL | Status: DC
  Administered 2016-05-13: 0.5 mg
  Filled 2016-05-12: qty 1

## 2016-05-12 MED ORDER — INSULIN ASPART 100 UNIT/ML ~~LOC~~ SOLN
0.0000 [IU] | SUBCUTANEOUS | Status: DC
  Administered 2016-05-13: 1 [IU] via SUBCUTANEOUS

## 2016-05-12 MED ORDER — RISPERIDONE 1 MG PO TABS
1.0000 mg | ORAL_TABLET | Freq: Two times a day (BID) | ORAL | Status: DC
  Administered 2016-05-13 (×2): 1 mg
  Filled 2016-05-12 (×3): qty 1

## 2016-05-12 NOTE — Progress Notes (Signed)
Date:  May 12, 2016 Chart reviewed for concurrent status and case management needs. Will continue to follow patient progress. Hypotension requiring iv vasopressors. Discharge Planning: following for needs Expected discharge date: [id number] Rhonda Davis, BSN, [place], [place]

## 2016-05-12 NOTE — Progress Notes (Signed)
Pt vomited twice, suctioned mouth and put pt on non-rebreather due to sats in low 80's. MD notified.

## 2016-05-12 NOTE — Progress Notes (Signed)
Note pt now NPO per [facility] notes.  SLP will follow for readiness for po/repeat instrumental evaluation.   Luanna Salk, [place] [facility] SLP [phone]

## 2016-05-12 NOTE — Progress Notes (Signed)
Removed Panda tube per MD order, tip intact, pt tolerated well.

## 2016-05-12 NOTE — Progress Notes (Signed)
PULMONARY / CRITICAL CARE MEDICINE   Name: [name] MRN: [id number] DOB: August 08, 1965    ADMISSION DATE:  04/30/2016 CONSULTATION DATE:  04/30/2016  REFERRING MD:  Dr. Kathrynn Humble   CHIEF COMPLAINT: Hypoxia, weakness, AMS   BRIEF SUMMARY:  50 y.o. male with PMH of MR (baseline lives at group home, IQ ~ 50-70) and prior tracheostomy, with recent admit for RLL PNA.  Patient treated and discharged on levaquin.  Found 12/6 altered, hypothermic, anemic with diffuse bilateral infiltrates.  Required intubation in ER.  Self extubated / re-intubated.    SUBJECTIVE:  No acute events overnight. Patient remains on vasopressor support.Continues to have some degree of encephalopathy with known underlying baseline intellectual handicap. Neurological status still too tenuous for oral diet or medications. Patient denies any pain or difficulty breathing with a head nod at this time.  REVIEW OF SYSTEMS:  Unable to assess with ongoing encephalopathy.  VITAL SIGNS: BP (!) 118/52   Pulse (!) 58   Temp 98.6 F (37 C)   Resp (!) 22   Ht 6\' 2"  (1.88 m)   Wt 181 lb 7 oz (82.3 kg)   SpO2 97%   BMI 23.30 kg/m   INTAKE / OUTPUT: I/O last 3 completed shifts: In: 6659.8 [I.V.:3936.5; NG/GT:2323.3; IV Piggyback:400] Out: 4200 [Urine:4200]  PHYSICAL EXAMINATION: General:  No distress. No family at bedside. Watching TV.  HEENT: R NGT in place. No scleral icterus or injection. Moist mucus membranes. PULM: Clear with auscultation. Normal work of breathing on room air. Slightly diminished in the bases. Cardiovascular:  Regular rate. No edema. Unable to appreciate JVD. GI: Soft. Nontender. Normal bowel sounds. Integument:  Warm & dry. No rash on exposed skin. Neurological:  Following commands. Still confused as to the year. Moving all 4 extremities.   LABS:  PULMONARY No results for input(s): PHART, PCO2ART, PO2ART, HCO3, TCO2, O2SAT in the last 168 hours.  Invalid input(s): PCO2, PO2  CBC  Recent  Labs Lab 05/10/16 0551 05/11/16 0433 05/12/16 0426  HGB 9.1* 8.0* 7.8*  HCT 29.1* 26.0* 25.3*  WBC 9.3 7.7 7.4  PLT 820* 828* 769*    COAGULATION  Recent Labs Lab 05/10/16 0551  INR 1.27    CARDIAC    Recent Labs Lab 05/08/16 1029 05/08/16 1657 05/08/16 2305  TROPONINI <0.03 <0.03 <0.03   No results for input(s): PROBNP in the last 168 hours.   CHEMISTRY  Recent Labs Lab 05/08/16 0505 05/09/16 0450 05/09/16 1145 05/09/16 1716 05/10/16 0551 05/10/16 1702 05/11/16 0433 05/12/16 0426  NA 141 137  --   --  141  --  141 143  K 3.9 3.7  --   --  4.1  --  3.6 3.5  CL 103 102  --   --  102  --  107 106  CO2 32 28  --   --  30  --  30 31  GLUCOSE 108* 111*  --   --  138*  --  179* 177*  BUN 16 13  --   --  15  --  12 13  CREATININE 0.81 0.80  --   --  0.77  --  0.80 0.66  CALCIUM 8.4* 8.4*  --   --  8.2*  --  8.2* 8.1*  MG 2.1 2.1 2.1 2.2 2.4 2.2  --   --   PHOS  --  2.6 3.3 2.6 3.4 2.5  --   --    Estimated Creatinine Clearance: 131.3 mL/min (by C-G  formula based on SCr of 0.66 mg/dL).   LIVER  Recent Labs Lab 05/10/16 0551  INR 1.27    INFECTIOUS  Recent Labs Lab 05/07/16 1235 05/08/16 0505 05/09/16 0450  PROCALCITON 0.12 0.14 0.16    ENDOCRINE CBG (last 3)   Recent Labs  05/11/16 1958 05/11/16 2327 05/12/16 0316  GLUCAP 167* 180* 162*     SIGNIFICANT EVENTS: 11/28 - 12/1 > Admission for CAP vs Aspiration PNA > D/C with 7 day course of Levaquin  .....................Marland Kitchen 12/06 - readmit 12/08 - bloody secretions from ETT.  Pt did not receive blood overnight due to consent issues.  Hgb down to 6.4.  Neo @ 15  12/09 - SBT. No hemoptysis. No sedation gtt >calm. Neo @ 33mcg. 2L +. Fever curve better. Self extubated 12/10 -  Neo, increased O2/4L negative. Vomited; re-intubated by anesthesia, R lung pneumothorax, chest tube placed 12/13 - extubated  12/14 - remains on neo, chest tube removed 12/15 - remains on neo 12/17 - off  neo  STUDIES:  CXR 12/6: Severe diffuse bilateral airspace disease. Small right effusion  TTE 12/6: LVEF 60% Bronchoscopy 12/7: likely traumatic abrasion to trachea from ETT TTE 12/7: normal LVEF 60-65%, Could not estimate RVSP TTE 12/14: normal LVEF LE Doppler 12/14: no DVT CT Angiogram 12/15: diffuse RUL infiltrate   MICROBIOLOGY: MRSA PCR 11/29:  Negative Blood Ctx x2 11/29:  Negative  Influenza A PCR 11/29:  Negative Urine Streptococcal Ag 11/29:  Negative Urine Legionella Ag 11/29:  Negative  Influenza A PCR 12/6:  Negative  Blood Ctx x2 12/6:  Negative Respiratory Viral Panel PCR 12/6:  Negative  Urine Ctx 12/6:  Negative Urine Streptococcal Ag 12/6:  Negative Urine Legionella Ag 12/6:  Negative  BAL Ctx 12/7:  Bacteria Negative / Fungus Pending Respiratory Viral Panel PCR 12/7:  Negative  R Pleural Fluid Ctx 12/10:  Negative Tracheal Asp Ctx 12/11:  Negative Blood Ctx x2 12/11:  Negative   ANTIBIOTICS: Cefepime 12/6 - 12/8 Vanco 12/6 - 12/8; 12/11 - 12/13 Doxycycline 12/6 - 12/8 Unasyn 12/8 - 12/11 Meropenem 12/11 - 12/17   ASSESSMENT / PLAN:  50 y.o. male, group home resident, with PMH of mental delay & recent hospitalization for RLL PNA admitted 12/6 with diffuse infiltrates, hypothermia and concern for sepsis.  He self extubated on 12/9, but was then re-intubated in the setting of increased work of breathing and R lung atelectasis and likely aspiration pneumonia on 12/11, had a pneumothorax post intubation.Patient successfully extubated. Respiratory status remained stable postextubation. Has ongoing shock requiring continuous infusion of vasopressor support.  1. Acute Hypoxic respiratory Failure:  Secondary to severe CAP & ARDS. Improving post Extubation 12/13. Continuing to wean Fio2 for Sat >90%. Continuing incentive spirometry q4hr while awake. 2. Severe CAP/Recurrent HCAP:  Completed courses of antibiotics. Plan for repeat CXR PA/LAT in 4-6 weeks to ensure  resolution of lung opacities. 3. Right Lung Pneumothorax:  After repeat endotracheal intubation. Resolved. Chest tube removed 12/14. 4. Shock:  Wide pulse pressure. Possibly secondary to resolving sepsis. Unlikely secondary to cardiogenic given normal echocardiogram. Questionable adrenal insufficiency. Weaning Levophed for MAP >65. Increasing Midodrine to 10mg  PO TID. 5. Possible Adrenal Insufficiency:  Serum cortisol 12.9 to 22.7. Started Solu-Cortef 50mg  IV q6hr on 12/15. Decreasing to IV q8hr on 12/18. 6. Anemia:  No signs of active bleeding. Continuing to trend Hgb daily w/ CBC. Plan to transfuse for Hgb <7.0 or signs of active bleeding. 7. Acute Encephalopathy:  Likely multifactorial from hypoxia  and toxic metabolic. Improving. 8. Hyperglycemia:  No h/o DM. Likely secondary to steroids and tube feedings. Checking Hgb A1c. Continuing accu-checks q4hr with SSI per sensitive algorithm. 9. H/O Seizure Disorder:  No recent seizure activity. Continuing home Klonopin, Tegretol, & Lamictal.  10. 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 Holding home Trazodone & Ambien.  11. Prophylaxis:  Pepcid PO bid, SCDs, & Heparin [place] q8hr. 12. Diet w/ H/O Dysphagia:  D/C Reglan. Speech consulted. Keeping patient NPO given continued encephalopathy. Continuing tube feedings & free water flushes. Starting Senna qhs & Colace BID.  13. Disposition:  Pending resolution of hypotension patient will remain in ICU with close monitoring and further titration of his vasopressor.    FAMILY  - Updates: Attempted to call mother, no answer.  Pt's caretaker [name] [phone] updated on status.  Group home - [facility] updated via phone 12/7.  Consent obtained for blood and FOB from [place] director of the group home.  She reports the mother prefers for them to help with medical care / decisions.  No answer from Mother via phone.  - no family 12/11; called [name] on 12/11  and called family on 12/11 but got no answer - no family 12/12 - called mother 12/13, no answer at numbers in chart.  [name] called and updated on plan of care.  She gives an additional phone number for mother >  [phone].  Able to reach sister Jackelyn Poling) and updated on patients status.  She states she wants to do "whatever is best for Vidyuth" but also relays concerns about his compliance and level of understanding of trach if needed.  If fails extubation, we will have further conversations regarding trach placement.  Concerned this may not be in his best interest.    I have spent a total of 33 minutes of critical care time today caring for the patient and reviewing the patient's electronic medical record.  Sonia Baller Ashok Cordia, M.D. [facility] Pulmonary & Critical Care Pager:  [phone] After 3pm or if no response, call [phone] 05/12/2016, 9:22 AM

## 2016-05-12 NOTE — Progress Notes (Signed)
[facility] Progress Note Patient Name: RODDRICK RUTIGLIANO DOB: June 28, 1965 MRN: [id number]   Date of Service  05/12/2016  HPI/Events of Note  CXR reviewed- Feeding tube appears coiled in esophagus. Stable lung opacities and PICC line.   eICU Interventions  Asked nurse to remove feeding tube.        Kayceon Oki 05/12/2016, 11:49 PM
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
## 2016-05-13 LAB — CBC WITH DIFFERENTIAL/PLATELET
BASOS ABS: 0 10*3/uL (ref 0.0–0.1)
BASOS PCT: 0 %
EOS ABS: 0 10*3/uL (ref 0.0–0.7)
EOS PCT: 0 %
HCT: 26.7 % — ABNORMAL LOW (ref 39.0–52.0)
Hemoglobin: 8.2 g/dL — ABNORMAL LOW (ref 13.0–17.0)
Lymphocytes Relative: 5 %
Lymphs Abs: 0.5 10*3/uL — ABNORMAL LOW (ref 0.7–4.0)
MCH: 28.5 pg (ref 26.0–34.0)
MCHC: 30.7 g/dL (ref 30.0–36.0)
MCV: 92.7 fL (ref 78.0–100.0)
MONO ABS: 0.5 10*3/uL (ref 0.1–1.0)
Monocytes Relative: 5 %
NEUTROS ABS: 8.4 10*3/uL — AB (ref 1.7–7.7)
Neutrophils Relative %: 90 %
PLATELETS: 834 10*3/uL — AB (ref 150–400)
RBC: 2.88 MIL/uL — ABNORMAL LOW (ref 4.22–5.81)
RDW: 18.7 % — AB (ref 11.5–15.5)
WBC: 9.4 10*3/uL (ref 4.0–10.5)

## 2016-05-13 LAB — RENAL FUNCTION PANEL
ALBUMIN: 2.7 g/dL — AB (ref 3.5–5.0)
Anion gap: 7 (ref 5–15)
BUN: 18 mg/dL (ref 6–20)
CALCIUM: 8.2 mg/dL — AB (ref 8.9–10.3)
CO2: 33 mmol/L — ABNORMAL HIGH (ref 22–32)
CREATININE: 0.8 mg/dL (ref 0.61–1.24)
Chloride: 105 mmol/L (ref 101–111)
GFR calc Af Amer: >60 mL/min (ref >60)
GFR calc non Af Amer: >60 mL/min (ref >60)
Glucose, Bld: 131 mg/dL — ABNORMAL HIGH (ref 65–99)
PHOSPHORUS: 3.7 mg/dL (ref 2.5–4.6)
Potassium: 4 mmol/L (ref 3.5–5.1)
SODIUM: 145 mmol/L (ref 135–145)

## 2016-05-13 LAB — GLUCOSE, CAPILLARY
GLUCOSE-CAPILLARY: 100 mg/dL — AB (ref 65–99)
GLUCOSE-CAPILLARY: 98 mg/dL (ref 65–99)
Glucose-Capillary: 102 mg/dL — ABNORMAL HIGH (ref 65–99)
Glucose-Capillary: 104 mg/dL — ABNORMAL HIGH (ref 65–99)
Glucose-Capillary: 111 mg/dL — ABNORMAL HIGH (ref 65–99)
Glucose-Capillary: 126 mg/dL — ABNORMAL HIGH (ref 65–99)

## 2016-05-13 LAB — MAGNESIUM: MAGNESIUM: 2.1 mg/dL (ref 1.7–2.4)

## 2016-05-13 LAB — HEMOGLOBIN A1C
Hgb A1c MFr Bld: 6.1 % — ABNORMAL HIGH (ref 4.8–5.6)
MEAN PLASMA GLUCOSE: 128 mg/dL

## 2016-05-13 NOTE — Progress Notes (Signed)
PULMONARY / CRITICAL CARE MEDICINE   Name: [name] MRN: [id number] DOB: 06/03/1965    ADMISSION DATE:  04/30/2016 CONSULTATION DATE:  04/30/2016  REFERRING MD:  Dr. Kathrynn Humble   CHIEF COMPLAINT: Hypoxia, weakness, AMS   BRIEF SUMMARY:  50 y.o. male with PMH of MR (baseline lives at group home, IQ ~ 50-70) and prior tracheostomy, with recent admit for RLL PNA.  Patient treated and discharged on levaquin.  Found 12/6 altered, hypothermic, anemic with diffuse bilateral infiltrates.  Required intubation in ER.  Self extubated / re-intubated.    SUBJECTIVE:  Patient pulled out NGT yesterday morning and had it replaced. Patient had significant emesis yesterday afternoon/evening of tube feedings due to mal-placement of new feeding tube. Patient reports some abdominal discomfort but denies any difficulty breathing. Patient is more appropriate today in his answers.  REVIEW OF SYSTEMS:  Difficult to assess with still some mild delirium.  VITAL SIGNS: BP (!) 117/56   Pulse 81   Temp 98.8 F (37.1 C)   Resp (!) 24   Ht 6\' 2"  (1.88 m)   Wt 178 lb 2.1 oz (80.8 kg)   SpO2 95%   BMI 22.87 kg/m   INTAKE / OUTPUT: I/O last 3 completed shifts: In: 4402.4 [I.V.:2693.7; NG/GT:1608.8; IV Piggyback:100] Out: [id number] [Urine:3200; Emesis/NG output:450]  PHYSICAL EXAMINATION: General:  No distress. No family at bedside. Nurse at bedside. HEENT: No scleral icterus or injection. Moist mucus membranes. PULM: Clear with auscultation. Increased work of breathing on nonrebreather mask. Slightly diminished in the bases. Cardiovascular:  Regular rhythm with mild tachycardia. No edema. Unable to appreciate JVD. GI: Soft. Nontender. Normal bowel sounds. Integument:  Warm & dry. No rash on exposed skin. Neurological:  Following commands. Nods appropriately. Still has trouble answering some questions.   LABS:  PULMONARY No results for input(s): PHART, PCO2ART, PO2ART, HCO3, TCO2, O2SAT in the last 168  hours.  Invalid input(s): PCO2, PO2  CBC  Recent Labs Lab 05/10/16 0551 05/11/16 0433 05/12/16 0426  HGB 9.1* 8.0* 7.8*  HCT 29.1* 26.0* 25.3*  WBC 9.3 7.7 7.4  PLT 820* 828* 769*    COAGULATION  Recent Labs Lab 05/10/16 0551  INR 1.27    CARDIAC    Recent Labs Lab 05/08/16 1029 05/08/16 1657 05/08/16 2305  TROPONINI <0.03 <0.03 <0.03   No results for input(s): PROBNP in the last 168 hours.   CHEMISTRY  Recent Labs Lab 05/08/16 0505 05/09/16 0450 05/09/16 1145 05/09/16 1716 05/10/16 0551 05/10/16 1702 05/11/16 0433 05/12/16 0426  NA 141 137  --   --  141  --  141 143  K 3.9 3.7  --   --  4.1  --  3.6 3.5  CL 103 102  --   --  102  --  107 106  CO2 32 28  --   --  30  --  30 31  GLUCOSE 108* 111*  --   --  138*  --  179* 177*  BUN 16 13  --   --  15  --  12 13  CREATININE 0.81 0.80  --   --  0.77  --  0.80 0.66  CALCIUM 8.4* 8.4*  --   --  8.2*  --  8.2* 8.1*  MG 2.1 2.1 2.1 2.2 2.4 2.2  --   --   PHOS  --  2.6 3.3 2.6 3.4 2.5  --   --    Estimated Creatinine Clearance: 129.1 mL/min (by C-G formula  based on SCr of 0.66 mg/dL).   LIVER  Recent Labs Lab 05/10/16 0551  INR 1.27    INFECTIOUS  Recent Labs Lab 05/07/16 1235 05/08/16 0505 05/09/16 0450  PROCALCITON 0.12 0.14 0.16    ENDOCRINE CBG (last 3)   Recent Labs  05/12/16 1956 05/12/16 2322 05/13/16 0328  GLUCAP 104* 107* 100*     SIGNIFICANT EVENTS: 11/28 - 12/1 > Admission for CAP vs Aspiration PNA > D/C with 7 day course of Levaquin  .....................Marland Kitchen 12/06 - readmit 12/08 - bloody secretions from ETT.  Pt did not receive blood overnight due to consent issues.  Hgb down to 6.4.  Neo @ 15  12/09 - SBT. No hemoptysis. No sedation gtt >calm. Neo @ 35mcg. 2L +. Fever curve better. Self extubated 12/10 -  Neo, increased O2/4L negative. Vomited; re-intubated by anesthesia, R lung pneumothorax, chest tube placed 12/13 - extubated  12/14 - remains on neo, chest tube  removed 12/15 - remains on neo 12/17 - off neo  STUDIES:  CXR 12/6: Severe diffuse bilateral airspace disease. Small right effusion  TTE 12/6: LVEF 60% Bronchoscopy 12/7: likely traumatic abrasion to trachea from ETT TTE 12/7: normal LVEF 60-65%, Could not estimate RVSP TTE 12/14: normal LVEF LE Doppler 12/14: no DVT CT Angiogram 12/15: diffuse RUL infiltrate  Port CXR 12/18:  Personally reviewed by me. No new opacity. Feeding tube coiled and looped in esophagus. Significantly different from previous positioning on abdominal film after replacement.  MICROBIOLOGY: MRSA PCR 11/29:  Negative Blood Ctx x2 11/29:  Negative  Influenza A PCR 11/29:  Negative Urine Streptococcal Ag 11/29:  Negative Urine Legionella Ag 11/29:  Negative  Influenza A PCR 12/6:  Negative  Blood Ctx x2 12/6:  Negative Respiratory Viral Panel PCR 12/6:  Negative  Urine Ctx 12/6:  Negative Urine Streptococcal Ag 12/6:  Negative Urine Legionella Ag 12/6:  Negative  BAL Ctx 12/7:  Bacteria Negative / Fungus Pending Respiratory Viral Panel PCR 12/7:  Negative  R Pleural Fluid Ctx 12/10:  Negative Tracheal Asp Ctx 12/11:  Negative Blood Ctx x2 12/11:  Negative   ANTIBIOTICS: Cefepime 12/6 - 12/8 Vanco 12/6 - 12/8; 12/11 - 12/13 Doxycycline 12/6 - 12/8 Unasyn 12/8 - 12/11 Meropenem 12/11 - 12/17   LINES/TUBES: RUE DL PICC 12/7 >> Foley 12/6 >>  ASSESSMENT / PLAN:  50 y.o. male, group home resident, with PMH of mental delay & recent hospitalization for RLL PNA admitted 12/6 with diffuse infiltrates, hypothermia and concern for sepsis.  He self extubated on 12/9, but was then re-intubated in the setting of increased work of breathing and R lung atelectasis and likely aspiration pneumonia on 12/11, had a pneumothorax post intubation. Patient had nausea & emesis of tube feedings overnight. Respiratory status remains somewhat tenuous with potential need for reintubation. Patient successfully weaned off vasopressors  overnight.   1. Acute Hypoxic respiratory Failure:  Secondary to severe CAP & ARDS. Improving post Extubation 12/13. Continuing to wean Fio2 for Sat >90%. Continuing incentive spirometry q4hr while awake. 2. Severe CAP/Recurrent HCAP:  Completed courses of antibiotics. Plan for repeat CXR PA/LAT in 4-6 weeks to ensure resolution of lung opacities. 3. Right Lung Pneumothorax:  After repeat endotracheal intubation. Resolved. Chest tube removed 12/14. 4. Shock:  Resolved. Likely secondary to resolving sepsis. Unlikely secondary to cardiogenic given normal echocardiogram. Questionable adrenal insufficiency. Goal MAP >65. Continuing Midodrine 10mg  PO TID. 5. Possible Adrenal Insufficiency:  Serum cortisol 12.9 to 22.7. Started Solu-Cortef 50mg  IV q6hr  on 12/15. Continuing IV dose q8hr started on 12/18. 6. Anemia:  No signs of active bleeding. Continuing to trend Hgb daily w/ CBC. Plan to transfuse for Hgb <7.0 or signs of active bleeding. 7. Acute Encephalopathy:  Likely multifactorial from hypoxia and toxic metabolic. Improving and nearly resolved. 8. Hyperglycemia:  No h/o DM. Likely secondary to steroids and tube feedings. Hgb A1c pending. Continuing accu-checks q4hr with SSI per sensitive algorithm. 9. H/O Seizure Disorder:  No recent seizure activity. Continuing home Klonopin, Tegretol, & Lamictal.  10. 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 Holding home Trazodone & Ambien.  11. Prophylaxis:  Pepcid PO bid, SCDs, & Heparin  q8hr. 12. Diet w/ H/O Dysphagia:  Speech following. Plan to try PO diet today if respiratory status remains stable. Senna qhs & Colace BID.  13. Disposition:  Monitoring closely given acute worsening in hypoxia with emesis indicating possible aspiration and risk of reintubation.   FAMILY  - Updates: Attempted to call mother, no answer.  Pt's caretaker [name] [phone] updated on status.  Group home - [facility] updated via phone  12/7.  Consent obtained for blood and FOB from [place] director of the group home.  She reports the mother prefers for them to help with medical care / decisions.  No answer from Mother via phone.  - no family 12/11; called [name] on 12/11 and called family on 12/11 but got no answer - no family 12/12 - called mother 12/13, no answer at numbers in chart.  [name] called and updated on plan of care.  She gives an additional phone number for mother >  [phone].  Able to reach sister Jackelyn Poling) and updated on patients status.  She states she wants to do "whatever is best for Jamareon" but also relays concerns about his compliance and level of understanding of trach if needed.  If fails extubation, we will have further conversations regarding trach placement.  Concerned this may not be in his best interest.    I have spent a total of 31 minutes of critical care time today caring for the patient and reviewing the patient's electronic medical record.  Sonia Baller Ashok Cordia, M.D. [facility] Pulmonary & Critical Care Pager:  [phone] After 3pm or if no response, call [phone] 05/13/2016, 7:37 AM

## 2016-05-14 DIAGNOSIS — G9340 Encephalopathy, unspecified: Secondary | ICD-10-CM

## 2016-05-14 LAB — CBC WITH DIFFERENTIAL/PLATELET
BASOS PCT: 0 %
Basophils Absolute: 0 10*3/uL (ref 0.0–0.1)
EOS ABS: 0.1 10*3/uL (ref 0.0–0.7)
Eosinophils Relative: 1 %
HEMATOCRIT: 24 % — AB (ref 39.0–52.0)
HEMOGLOBIN: 7.3 g/dL — AB (ref 13.0–17.0)
LYMPHS ABS: 1.5 10*3/uL (ref 0.7–4.0)
Lymphocytes Relative: 16 %
MCH: 28.5 pg (ref 26.0–34.0)
MCHC: 30.4 g/dL (ref 30.0–36.0)
MCV: 93.8 fL (ref 78.0–100.0)
Monocytes Absolute: 0.8 10*3/uL (ref 0.1–1.0)
Monocytes Relative: 9 %
NEUTROS ABS: 6.6 10*3/uL (ref 1.7–7.7)
NEUTROS PCT: 74 %
Platelets: 716 10*3/uL — ABNORMAL HIGH (ref 150–400)
RBC: 2.56 MIL/uL — AB (ref 4.22–5.81)
RDW: 18.7 % — ABNORMAL HIGH (ref 11.5–15.5)
WBC: 8.9 10*3/uL (ref 4.0–10.5)

## 2016-05-14 LAB — GLUCOSE, CAPILLARY
GLUCOSE-CAPILLARY: 95 mg/dL (ref 65–99)
GLUCOSE-CAPILLARY: 101 mg/dL — AB (ref 65–99)
GLUCOSE-CAPILLARY: 101 mg/dL — AB (ref 65–99)
GLUCOSE-CAPILLARY: 153 mg/dL — AB (ref 65–99)
Glucose-Capillary: 118 mg/dL — ABNORMAL HIGH (ref 65–99)

## 2016-05-14 LAB — RENAL FUNCTION PANEL
ANION GAP: 5 (ref 5–15)
Albumin: 2.5 g/dL — ABNORMAL LOW (ref 3.5–5.0)
BUN: 20 mg/dL (ref 6–20)
CHLORIDE: 109 mmol/L (ref 101–111)
CO2: 30 mmol/L (ref 22–32)
CREATININE: 0.81 mg/dL (ref 0.61–1.24)
Calcium: 8.2 mg/dL — ABNORMAL LOW (ref 8.9–10.3)
Glucose, Bld: 101 mg/dL — ABNORMAL HIGH (ref 65–99)
Phosphorus: 3.6 mg/dL (ref 2.5–4.6)
Potassium: 3.5 mmol/L (ref 3.5–5.1)
Sodium: 144 mmol/L (ref 135–145)

## 2016-05-14 LAB — MAGNESIUM: MAGNESIUM: 2.2 mg/dL (ref 1.7–2.4)

## 2016-05-14 MED ORDER — ESCITALOPRAM OXALATE 10 MG PO TABS
10.0000 mg | ORAL_TABLET | Freq: Every day | ORAL | Status: DC
  Administered 2016-05-14 – 2016-05-17 (×4): 10 mg via ORAL
  Filled 2016-05-14 (×4): qty 1

## 2016-05-14 MED ORDER — INSULIN ASPART 100 UNIT/ML ~~LOC~~ SOLN
0.0000 [IU] | Freq: Three times a day (TID) | SUBCUTANEOUS | Status: DC
  Administered 2016-05-15 – 2016-05-17 (×3): 1 [IU] via SUBCUTANEOUS

## 2016-05-14 MED ORDER — SODIUM CHLORIDE 0.9 % IV SOLN: 30.0000 meq | Freq: Once | INTRAVENOUS | Status: DC

## 2016-05-14 MED ORDER — CARBAMAZEPINE 200 MG PO TABS
200.0000 mg | ORAL_TABLET | Freq: Three times a day (TID) | ORAL | Status: DC
Start: 2016-05-14 — End: 2016-05-17
  Administered 2016-05-14 – 2016-05-17 (×11): 200 mg via ORAL
  Filled 2016-05-14 (×15): qty 1

## 2016-05-14 MED ORDER — DOCUSATE SODIUM 100 MG PO CAPS
100.0000 mg | ORAL_CAPSULE | Freq: Two times a day (BID) | ORAL | Status: DC
  Administered 2016-05-14 – 2016-05-17 (×6): 100 mg via ORAL
  Filled 2016-05-14 (×7): qty 1

## 2016-05-14 MED ORDER — RISPERIDONE 1 MG PO TABS
1.0000 mg | ORAL_TABLET | Freq: Two times a day (BID) | ORAL | Status: DC
  Administered 2016-05-14 – 2016-05-17 (×7): 1 mg via ORAL
  Filled 2016-05-14 (×7): qty 1

## 2016-05-14 MED ORDER — FAMOTIDINE 20 MG PO TABS
20.0000 mg | ORAL_TABLET | Freq: Two times a day (BID) | ORAL | Status: DC
  Administered 2016-05-14 – 2016-05-17 (×7): 20 mg via ORAL
  Filled 2016-05-14 (×7): qty 1

## 2016-05-14 MED ORDER — SENNOSIDES-DOCUSATE SODIUM 8.6-50 MG PO TABS
1.0000 | ORAL_TABLET | Freq: Every day | ORAL | Status: DC
  Administered 2016-05-14 – 2016-05-16 (×3): 1 via ORAL
  Filled 2016-05-14 (×3): qty 1

## 2016-05-14 MED ORDER — LAMOTRIGINE 100 MG PO TABS
100.0000 mg | ORAL_TABLET | Freq: Two times a day (BID) | ORAL | Status: DC
Start: 2016-05-14 — End: 2016-05-17
  Administered 2016-05-14 – 2016-05-17 (×7): 100 mg via ORAL
  Filled 2016-05-14 (×7): qty 1

## 2016-05-14 MED ORDER — POTASSIUM CHLORIDE 10 MEQ/100ML IV SOLN
10.0000 meq | INTRAVENOUS | Status: AC
  Administered 2016-05-14 (×3): 10 meq via INTRAVENOUS
  Filled 2016-05-14 (×3): qty 100

## 2016-05-14 MED ORDER — BENZTROPINE MESYLATE 0.5 MG PO TABS
0.5000 mg | ORAL_TABLET | Freq: Every day | ORAL | Status: DC
  Administered 2016-05-14 – 2016-05-17 (×4): 0.5 mg via ORAL
  Filled 2016-05-14 (×4): qty 1

## 2016-05-14 MED ORDER — INSULIN ASPART 100 UNIT/ML ~~LOC~~ SOLN: 0.0000 [IU] | Freq: Every day | SUBCUTANEOUS | Status: DC

## 2016-05-14 MED ORDER — CLONAZEPAM 0.5 MG PO TABS
0.5000 mg | ORAL_TABLET | Freq: Every day | ORAL | Status: DC
  Administered 2016-05-14 – 2016-05-16 (×3): 0.5 mg via ORAL
  Filled 2016-05-14 (×3): qty 1

## 2016-05-14 MED ORDER — MIDODRINE HCL 5 MG PO TABS
10.0000 mg | ORAL_TABLET | Freq: Three times a day (TID) | ORAL | Status: DC
  Administered 2016-05-14 – 2016-05-17 (×11): 10 mg via ORAL
  Filled 2016-05-14 (×13): qty 2

## 2016-05-14 NOTE — Progress Notes (Signed)
Speech Language Pathology Treatment: Dysphagia  Patient Details Name: [name] MRN: [id number] DOB: 04/24/1966 Today's Date: 05/14/2016 Time: [id number] SLP Time Calculation (min) (ACUTE ONLY): 15 min  Assessment / Plan / Recommendation Clinical Impression  Pt tolerating po intake well.  SLP assisted to sit pt upright and pt able to self feed.  He does tend to take large boluses and needs moderate cues to slow rate, take smaller bites.  NO indication of aspiration with po observed, puree/pudding thick. He does continue with delayed oral transiting and suspected delayed pharyngeal swallow.  Per MBS, swallow delayed to pyriform sinus but was strong without residuals.  Ice chip bolus consumption observed with good toelrance but given pt just started po, recommend continue puree/pudding.  Weak volitional cough and voice noted.  SLP reviewed MBS with pt - in EPIC - images explaining swallow function.  Spoke to NT and RN explaining MBS results and indication for diet.   Will follow for readiness for dietary advancement.    HPI HPI: 50 y/o M with PMH of MR (baseline lives at group home, IQ ~ 50-70) and prior tracheostomy, with recent admit for RLL PNA. Patient treated and discharged on levaquin. Found 12/6 altered, hypothermic, anemic with diffuse bilateral infiltrates. Required intubation in ER. Self extubated 12/9, re-intubated until 12/13. During prior admission pt seen by SLP, resumed diet of Dys 3/nectar thick liquids that pt consumes at group home with full supervision due to impulsivity.       SLP Plan  Continue with current plan of care     Recommendations  Diet recommendations: Dysphagia 1 (puree);Pudding-thick liquid Liquids provided via: Cup;No straw Medication Administration: Whole meds with puree Supervision: Patient able to self feed;Full supervision/cueing for compensatory strategies Compensations: Slow rate;Small sips/bites;Minimize environmental distractions Postural Changes  and/or Swallow Maneuvers: Seated upright 90 degrees;Upright 30-60 min after meal                Oral Care Recommendations: Oral care BID Follow up Recommendations: Home health SLP Plan: Continue with current plan of care       [place], [facility] SLP [phone]

## 2016-05-14 NOTE — Evaluation (Signed)
Physical Therapy Evaluation Patient Details Name: [name] MRN: [id number] DOB: 1966/02/27 Today's Date: 05/14/2016   History of Present Illness  50 y.o. male with PMH of MR (baseline lives at group home,  and prior tracheostomy, with recent admit for RLL PNA.  Marland Kitchen  Found 12/6 altered, hypothermic, anemic with diffuse bilateral infiltrates.  Required intubation in ER 12/6  Clinical Impression  The patient was alert, attempts to communicate verbal but difficult to understand. Prior level of function unknown.Info from group home would be most beneficial  For PT goals. Pt admitted with above diagnosis. Pt currently with functional limitations due to the deficits listed below (see PT Problem List).  Pt will benefit from skilled PT to increase their independence and safety with mobility to allow discharge to the venue listed below.       Follow Up Recommendations SNF;Supervision/Assistance - 24 hour    Equipment Recommendations  None recommended by PT    Recommendations for Other Services       Precautions / Restrictions Precautions Precautions: Fall      Mobility  Bed Mobility Overal bed mobility: Needs Assistance Bed Mobility: Supine to Sit     Supine to sit: Total assist;+2 for physical assistance;+2 for safety/equipment     General bed mobility comments: use of bed pad  and assist with legs an trunk  Transfers Overall transfer level: Needs assistance   Transfers: Squat Pivot Transfers;Lateral/Scoot Transfers     Squat pivot transfers: +2 physical assistance;+2 safety/equipment;Total assist    Lateral/Scoot Transfers: Total assist General transfer comment: scoot/slide with pads to recliner with drop arm. patient  did bear some weight through the legs. Trunk listing to right and severly flexed forward.   Ambulation/Gait                Stairs            Wheelchair Mobility    Modified Rankin (Stroke Patients Only)       Balance Overall balance  assessment: History of Falls;Needs assistance Sitting-balance support: Feet supported;Bilateral upper extremity supported Sitting balance-Leahy Scale: Zero                                       Pertinent Vitals/Pain Pain Assessment: Faces Faces Pain Scale: No hurt    Home Living Family/patient expects to be discharged to:: Group home                      Prior Function           Comments: patient uses a WC for mobility, assisted with ADL's     Hand Dominance        Extremity/Trunk Assessment   Upper Extremity Assessment Upper Extremity Assessment: RUE deficits/detail;LUE deficits/detail RUE Deficits / Details: increased tom=ne, gross movements and gripping. LUE Deficits / Details: les functional movements noted, hand fisted bur opened fingers  noted edema of the arm    Lower Extremity Assessment Lower Extremity Assessment: LLE deficits/detail;RLE deficits/detail RLE Deficits / Details: legs postured in extension, noted to flex the legs when sitting    Cervical / Trunk Assessment Cervical / Trunk Assessment: Other exceptions Cervical / Trunk Exceptions: severe trunk flexion and leans to the right  Communication   Communication: Expressive difficulties  Cognition Arousal/Alertness: Awake/alert Behavior During Therapy: WFL for tasks assessed/performed Overall Cognitive Status: Difficult to assess  General Comments: patient did attemt to answer simple questions, Speech slurred.    General Comments      Exercises     Assessment/Plan    PT Assessment Patient needs continued PT services  PT Problem List Decreased range of motion;Decreased activity tolerance;Decreased balance;Decreased mobility;Decreased coordination;Decreased cognition;Impaired tone          PT Treatment Interventions Functional mobility training;Therapeutic activities;Therapeutic exercise;Balance training;Patient/family education;Wheelchair  mobility training    PT Goals (Current goals can be found in the Care Plan section)  Acute Rehab PT Goals Patient Stated Goal: unable PT Goal Formulation: Patient unable to participate in goal setting Time For Goal Achievement: 05/28/16 Potential to Achieve Goals: Fair    Frequency Min 3X/week   Barriers to discharge        Co-evaluation               End of Session   Activity Tolerance: Patient tolerated treatment well Patient left: in chair;with call bell/phone within reach;with chair alarm set Nurse Communication: Mobility status;Need for lift equipment (maxisky or move)         Time: [id number] PT Time Calculation (min) (ACUTE ONLY): 33 min   Charges:   PT Evaluation $PT Eval Moderate Complexity: 1 Procedure     PT G CodesMarcelino Freestone PT [id number]  05/14/2016, 4:39 PM

## 2016-05-14 NOTE — Progress Notes (Signed)
PULMONARY / CRITICAL CARE MEDICINE   Name: [name] MRN: [id number] DOB: Apr 04, 1966    ADMISSION DATE:  04/30/2016 CONSULTATION DATE:  04/30/2016  REFERRING MD:  Dr. Kathrynn Humble   CHIEF COMPLAINT: Hypoxia, weakness, AMS   BRIEF SUMMARY:  50 y.o. male with PMH of MR (baseline lives at group home, IQ ~ 50-70) and prior tracheostomy, with recent admit for RLL PNA.  Patient treated and discharged on levaquin.  Found 12/6 altered, hypothermic, anemic with diffuse bilateral infiltrates.  Required intubation in ER.  Self extubated / re-intubated.    SUBJECTIVE:  No acute events overnight. Patient started on oral diet yesterday per speech recommendations. Patient reporting some mild chest discomfort. Denies any difficulty breathing. Difficult to understand in terms of speech this morning. Denies any nausea or vomiting.  REVIEW OF SYSTEMS:  Unable to obtain given clarity of speech and resolving encephalopathy.  VITAL SIGNS: BP (!) 102/40 (BP Location: Left Arm)   Pulse 76   Temp 98.6 F (37 C) (Core (Comment))   Resp (!) 9   Ht 6\' 2"  (1.88 m)   Wt 173 lb 4.5 oz (78.6 kg)   SpO2 99%   BMI 22.25 kg/m   INTAKE / OUTPUT: I/O last 3 completed shifts: In: 2257.2 [P.O.:240; I.V.:1817.2; IV Piggyback:200] Out: 2030 [Urine:2030]  PHYSICAL EXAMINATION: General:  No distress. No family at bedside. Awake. HEENT: No scleral icterus or injection. Moist mucus membranes. PULM: Clear with auscultation but slightly diminished in bases. Increased work of breathing on nasal cannula. Cardiovascular:  Regular rhythm. No edema. Unable to appreciate JVD. GI: Soft. Nontender. Normal bowel sounds. Integument:  Warm & dry. No rash on exposed skin. Neurological:  Following commands. Nods appropriately. Still has trouble answering some questions.   LABS:  PULMONARY No results for input(s): PHART, PCO2ART, PO2ART, HCO3, TCO2, O2SAT in the last 168 hours.  Invalid input(s): PCO2, PO2  CBC  Recent  Labs Lab 05/12/16 0426 05/13/16 0500 05/14/16 0410  HGB 7.8* 8.2* 7.3*  HCT 25.3* 26.7* 24.0*  WBC 7.4 9.4 8.9  PLT 769* 834* 716*    COAGULATION  Recent Labs Lab 05/10/16 0551  INR 1.27    CARDIAC    Recent Labs Lab 05/08/16 1029 05/08/16 1657 05/08/16 2305  TROPONINI <0.03 <0.03 <0.03   No results for input(s): PROBNP in the last 168 hours.   CHEMISTRY  Recent Labs Lab 05/09/16 1716 05/10/16 0551 05/10/16 1702 05/11/16 0433 05/12/16 0426 05/13/16 0500 05/14/16 0410  NA  --  141  --  141 143 145 144  K  --  4.1  --  3.6 3.5 4.0 3.5  CL  --  102  --  107 106 105 109  CO2  --  30  --  30 31 33* 30  GLUCOSE  --  138*  --  179* 177* 131* 101*  BUN  --  15  --  12 13 18 20   CREATININE  --  0.77  --  0.80 0.66 0.80 0.81  CALCIUM  --  8.2*  --  8.2* 8.1* 8.2* 8.2*  MG 2.2 2.4 2.2  --   --  2.1 2.2  PHOS 2.6 3.4 2.5  --   --  3.7 3.6   Estimated Creatinine Clearance: 124 mL/min (by C-G formula based on SCr of 0.81 mg/dL).   LIVER  Recent Labs Lab 05/10/16 0551 05/13/16 0500 05/14/16 0410  ALBUMIN  --  2.7* 2.5*  INR 1.27  --   --  INFECTIOUS  Recent Labs Lab 05/07/16 1235 05/08/16 0505 05/09/16 0450  PROCALCITON 0.12 0.14 0.16    ENDOCRINE CBG (last 3)   Recent Labs  05/13/16 2344 05/14/16 0414 05/14/16 0751  GLUCAP 102* 95 101*     SIGNIFICANT EVENTS: 11/28 - 12/1 > Admission for CAP vs Aspiration PNA > D/C with 7 day course of Levaquin  .....................Marland Kitchen 12/06 - readmit 12/08 - bloody secretions from ETT.  Pt did not receive blood overnight due to consent issues.  Hgb down to 6.4.  Neo @ 15  12/09 - SBT. No hemoptysis. No sedation gtt >calm. Neo @ 2mcg. 2L +. Fever curve better. Self extubated 12/10 -  Neo, increased O2/4L negative. Vomited; re-intubated by anesthesia, R lung pneumothorax, chest tube placed 12/13 - extubated  12/14 - remains on neo, chest tube removed 12/15 - remains on neo 12/17 - off neo 12/18  - nausea with emesis and mild aspiration from tube feedings  STUDIES:  CXR 12/6: Severe diffuse bilateral airspace disease. Small right effusion  TTE 12/6: LVEF 60% Bronchoscopy 12/7: likely traumatic abrasion to trachea from ETT TTE 12/7: normal LVEF 60-65%, Could not estimate RVSP TTE 12/14: normal LVEF LE Doppler 12/14: no DVT CT Angiogram 12/15: diffuse RUL infiltrate  Port CXR 12/18:  Personally reviewed by me. No new opacity. Feeding tube coiled and looped in esophagus. Significantly different from previous positioning on abdominal film after replacement.  MICROBIOLOGY: MRSA PCR 11/29:  Negative Blood Ctx x2 11/29:  Negative  Influenza A PCR 11/29:  Negative Urine Streptococcal Ag 11/29:  Negative Urine Legionella Ag 11/29:  Negative  Influenza A PCR 12/6:  Negative  Blood Ctx x2 12/6:  Negative Respiratory Viral Panel PCR 12/6:  Negative  Urine Ctx 12/6:  Negative Urine Streptococcal Ag 12/6:  Negative Urine Legionella Ag 12/6:  Negative  BAL Ctx 12/7:  Bacteria Negative / Fungus Pending Respiratory Viral Panel PCR 12/7:  Negative  R Pleural Fluid Ctx 12/10:  Negative Tracheal Asp Ctx 12/11:  Negative Blood Ctx x2 12/11:  Negative   ANTIBIOTICS: Cefepime 12/6 - 12/8 Vanco 12/6 - 12/8; 12/11 - 12/13 Doxycycline 12/6 - 12/8 Unasyn 12/8 - 12/11 Meropenem 12/11 - 12/17   LINES/TUBES: RUE DL PICC 12/7 >> Foley 12/6 >>  ASSESSMENT / PLAN:  50 y.o. male, group home resident, with PMH of mental delay & recent hospitalization for RLL PNA admitted 12/6 with diffuse infiltrates, hypothermia and concern for sepsis.  He self extubated on 12/9, but was then re-intubated in the setting of increased work of breathing and R lung atelectasis and likely aspiration pneumonia on 12/11, had a pneumothorax post intubation. Patient had nausea & emesis of tube feedings overnight 12/18. Respiratory status is continuing to improve with decreasing oxygen requirement. Tolerating diet. Shock has  resolved and blood pressure has remained stable as well. Patient's acute encephalopathy is resolving. Continuing treatment for possible adrenal insufficiency with Solu-Medrol IV every 8 hours for now. Given patient's continued clinical stability I feel it's reasonable to transition him out of the intensive care unit stay.  1. Acute Hypoxic respiratory Failure:  Secondary to severe CAP & ARDS. Resolving post extubation 12/13 and mild aspiration 12/18. Continuing to wean Fio2 for Sat >90%. Continuing incentive spirometry q4hr while awake. 2. Severe CAP/Recurrent HCAP:  Completed courses of antibiotics. Plan for repeat CXR PA/LAT in 4-6 weeks to ensure resolution of lung opacities. 3. Right Lung Pneumothorax:  After repeat endotracheal intubation. Resolved. Chest tube removed 12/14. 4. Shock:  Resolved. Likely secondary to resolving sepsis. Unlikely secondary to cardiogenic given normal echocardiogram. Questionable adrenal insufficiency. Goal MAP >65. Continuing Midodrine 10mg  PO TID. Discontinuing Levophed. 5. Possible Adrenal Insufficiency:  Serum cortisol 12.9 to 22.7. Started Solu-Cortef 50mg  IV q6hr on 12/15. Continuing IV dose q8hr started on 12/18. Plan for further weaning to q12hr starting tomorrow if remains stable. 6. Anemia:  No signs of active bleeding. Continuing to trend Hgb daily w/ CBC. Plan to transfuse for Hgb <7.0 or signs of active bleeding. 7. Acute Encephalopathy:  Likely multifactorial from hypoxia and toxic metabolic. Resolving. 8. Hyperglycemia:  No h/o DM (A1c 6.1). Likely secondary to steroids. Continuing accu-checks qAC & HS with SSI per sensitive algorithm. 9. H/O Seizure Disorder:  No recent seizure activity. Continuing home Klonopin, Tegretol, & Lamictal.  10. 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 Holding home Trazodone & Ambien.  11. Prophylaxis:  Pepcid PO bid, SCDs, & Heparin [place] q8hr. 12. Diet w/ H/O Dysphagia:  Speech following.  Continuing diet per their recommendations. 13. Disposition:  Transferring patient to telemetry bed. Consulting PT for evaluation.  TRH to assume care & PCCM off as of 12/21.   FAMILY  - Updates: Attempted to call mother, no answer.  Pt's caretaker [name] [phone] updated on status.  Group home - [facility] updated via phone 12/7.  Consent obtained for blood and FOB from [place] director of the group home.  She reports the mother prefers for them to help with medical care / decisions.  No answer from Mother via phone.  - no family 12/11; called [name] on 12/11 and called family on 12/11 but got no answer - no family 12/12 - called mother 12/13, no answer at numbers in chart.  [name] called and updated on plan of care.  She gives an additional phone number for mother >  [phone].  Able to reach sister Jackelyn Poling) and updated on patients status.  She states she wants to do "whatever is best for Teodulo" but also relays concerns about his compliance and level of understanding of trach if needed.  If fails extubation, we will have further conversations regarding trach placement.  Concerned this may not be in his best interest.   - sister contacted by number above and updated on transfer. Mother & sister plan to come to visit on Friday. Both are blind and need to use a taxi.   Sonia Baller Ashok Cordia, M.D. [facility] Pulmonary & Critical Care Pager:  [phone] After 3pm or if no response, call [phone] 05/14/2016, 8:22 AM

## 2016-05-14 NOTE — Progress Notes (Signed)
Nutrition Follow-up  DOCUMENTATION CODES:   Not applicable  INTERVENTION:  - Continue Dysphagia 1, pudding-thick diet. - Will order Magic Cup TID with meals, each supplement provides 290 kcal and 9 grams of protein - Continue to encourage PO intakes and self-feeding.  - RD will continue to monitor for additional needs.  NUTRITION DIAGNOSIS:   Inadequate oral intake related to inability to eat as evidenced by NPO status. -diet advanced and intakes improving.   GOAL:   Patient will meet greater than or equal to 90% of their needs -likely beginning to meet.   MONITOR:   PO intake, Supplement acceptance, Weight trends, Labs, Skin, I & O's  ASSESSMENT:   50 year old male with PMH of a traumatic brain injury with cognition difficulty whom lives in a group home, BPH, anxiety, and bipolar disorder;  presented 12/6 to ED from PCP with hypoxia, weakness and AMS. Per caretaker patient has not seemed the same since discharge on 12/1. Upon arrival to ED, patient rectal temp 89.2. CXR revealed progressed bilateral airspace disease with a right effusion. PAN cultures were sent and antibiotics started.  12/20 Panda has been out since 12/18 PM and pt continues on Dysphagia 1, pudding-thick with no intakes documented. Pt consumed 100% of breakfast this AM and RN reports pt fed himself for this meal. Spoke with pt briefly at time of visit. Will order Magic Cups to help pt in meeting estimated nutrition needs (Magic Cup "melts" to pudding-thick consistency). Weight -3.7 kg since last assessment; will continue to monitor.  Medications reviewed; 100 mg Colace BID, 20 mg oral Pepcid BID, 50 mg IV Solu-cortef TID, sliding scale Novolog, PRN IV Zofran, 10 mEq IV KCl x3 doses today, 1 tablet Senokot/day. Labs reviewed; CBGs: 95 and 101 mg/dL this AM, Ca: 8.2 mg/dL.  IVF: NS @ 50 mL/hr.    12/18 - Pt with Panda to R nare and receiving TF at goal rate: Jevity 1.2 @ 65 mL/hr with 100 mL free water QID.  -  This regimen provides 1872 kcal, 86 grams of protein, and 1659 mL free water.  - Diet advanced to Dysphagia 1, pudding thick on 12/15 at 1457 with no intakes documented.  - SLP note from that afternoon, outlining MBS, reviewed.  - Also reviewed CCM notes from 12/15 @ 2329 and 12/16 @ 0015 concerning Panda coiled in stomach and vomiting episode.  - Pt pulled Panda slightly during RD visit; charge RN adjusted and re-taped tube a few minutes later.  - Spoke with RN who reports staff does not feel comfortable providing pt with POs so he has been receiving all nutrition via TF.  - Continue current TF regimen.  - Weight -0.7 kg from previous assessment.  BP: 102/37 and MAP: 56. IVF: NS @ 50 mL/hr.  Drip: Levo @ 8 mcg/min.  ADDENDUM: Dr. Ashok Cordia indicates plan to decrease steroid order and possibly add Colace. Will continue to monitor CBGs with this adjustment of steroid.     12/15 - Pt was extubated 12/13 AM and SLP saw pt yesterday for swallow evaluation.  - Note from that encounter reviewed and recommended NPO. - NGT was maintained s/p extubation.  - Pt had pulled NGT and Panda placed at bedside yesterday; pt pulled it within minutes, soft wrist restraints later ordered and Panda replaced and currently in place to R nare.  - Weight today consistent with weight from 12/13.  - Weight -1.9 kg from 12/13-12/14 and then +2.2 kg from yesterday to today.  -  Will order TF at trickle rate and provide slower advancement d/t current vitals (BP: 80/36 and MAP: 50) and need for pressor support.  - Diastolic BP has been low today and current MAP is lowest in the past 24 hours.   Drip:Neo @ 180 mcg/min.   Diet Order:  DIET - DYS 1 Room service appropriate? Yes; Fluid consistency: Pudding Thick; Fluid restriction: 1500 mL Fluid  Skin:  Wound (see comment) (Stage 1 L ankle pressure injury )  Last BM:  12/19  Height:   Ht Readings from Last 1 Encounters:  04/30/16 6\' 2"  (1.88 m)    Weight:    Wt Readings from Last 1 Encounters:  05/14/16 173 lb 4.5 oz (78.6 kg)    Ideal Body Weight:  86.36 kg  BMI:  Body mass index is 22.25 kg/m.  Estimated Nutritional Needs:   Kcal:  1825-2075 (23-25 kcal/kg)  Protein:  83-100 grams (1-1.2 grams/kg)  Fluid:  1.8-2 L/day  EDUCATION NEEDS:   No education needs identified at this time    Jarome Matin, MS, RD, LDN, CNSC Inpatient Clinical Dietitian Pager # [phone] After hours/weekend pager # [phone]

## 2016-05-14 NOTE — Progress Notes (Signed)
[facility] ADULT ICU REPLACEMENT PROTOCOL FOR AM LAB REPLACEMENT ONLY  The patient does apply for the [facility] Adult ICU Electrolyte Replacment Protocol based on the criteria listed below:   1. Is GFR >/= 40 ml/min? Yes.    Patient's GFR today is >60 2. Is urine output >/= 0.5 ml/kg/hr for the last 6 hours? Yes.   Patient's UOP is 0.5 ml/kg/hr 3. Is BUN < 60 mg/dL? Yes.    Patient's BUN today is 20 4. Abnormal electrolyte  K 3.5 5. Ordered repletion with: per protocol 6. If a panic level lab has been reported, has the CCM MD in charge been notified? Yes.  .   Physician:  Philbert Riser 05/14/2016 5:21 AM

## 2016-05-15 LAB — CBC WITH DIFFERENTIAL/PLATELET
BASOS PCT: 1 %
Basophils Absolute: 0 10*3/uL (ref 0.0–0.1)
EOS ABS: 0.1 10*3/uL (ref 0.0–0.7)
Eosinophils Relative: 1 %
HEMATOCRIT: 24.6 % — AB (ref 39.0–52.0)
Hemoglobin: 7.3 g/dL — ABNORMAL LOW (ref 13.0–17.0)
Lymphocytes Relative: 16 %
Lymphs Abs: 1.1 10*3/uL (ref 0.7–4.0)
MCH: 27.7 pg (ref 26.0–34.0)
MCHC: 29.7 g/dL — AB (ref 30.0–36.0)
MCV: 93.2 fL (ref 78.0–100.0)
MONO ABS: 0.5 10*3/uL (ref 0.1–1.0)
MONOS PCT: 8 %
Neutro Abs: 5.2 10*3/uL (ref 1.7–7.7)
Neutrophils Relative %: 74 %
Platelets: 699 10*3/uL — ABNORMAL HIGH (ref 150–400)
RBC: 2.64 MIL/uL — ABNORMAL LOW (ref 4.22–5.81)
RDW: 18.4 % — AB (ref 11.5–15.5)
WBC: 7 10*3/uL (ref 4.0–10.5)

## 2016-05-15 LAB — GLUCOSE, CAPILLARY
GLUCOSE-CAPILLARY: 113 mg/dL — AB (ref 65–99)
GLUCOSE-CAPILLARY: 133 mg/dL — AB (ref 65–99)
GLUCOSE-CAPILLARY: 126 mg/dL — AB (ref 65–99)
Glucose-Capillary: 108 mg/dL — ABNORMAL HIGH (ref 65–99)
Glucose-Capillary: 125 mg/dL — ABNORMAL HIGH (ref 65–99)

## 2016-05-15 LAB — RENAL FUNCTION PANEL
ANION GAP: 6 (ref 5–15)
Albumin: 2.5 g/dL — ABNORMAL LOW (ref 3.5–5.0)
BUN: 22 mg/dL — ABNORMAL HIGH (ref 6–20)
CALCIUM: 8.2 mg/dL — AB (ref 8.9–10.3)
CO2: 28 mmol/L (ref 22–32)
Chloride: 108 mmol/L (ref 101–111)
Creatinine, Ser: 0.8 mg/dL (ref 0.61–1.24)
GFR calc Af Amer: >60 mL/min (ref >60)
GFR calc non Af Amer: >60 mL/min (ref >60)
GLUCOSE: 118 mg/dL — AB (ref 65–99)
Phosphorus: 3.8 mg/dL (ref 2.5–4.6)
Potassium: 4 mmol/L (ref 3.5–5.1)
SODIUM: 142 mmol/L (ref 135–145)

## 2016-05-15 LAB — MAGNESIUM: Magnesium: 2 mg/dL (ref 1.7–2.4)

## 2016-05-15 MED ORDER — HYDROCORTISONE NA SUCCINATE PF 100 MG IJ SOLR
50.0000 mg | Freq: Two times a day (BID) | INTRAMUSCULAR | Status: DC
  Administered 2016-05-16 (×2): 50 mg via INTRAVENOUS
  Filled 2016-05-15 (×2): qty 2

## 2016-05-15 NOTE — NC FL2 (Signed)
[place] LEVEL OF CARE SCREENING TOOL     IDENTIFICATION  Patient Name: [name] Birthdate: 07-Nov-1965 Sex: male Admission Date (Current Location): 04/30/2016  [facility] and [place] Number:  Herbalist and Address:  [facility],  [place] [address] [place]      Provider Number: [id number]  Attending Physician Name and Address:  Velvet Bathe, MD  Relative Name and Phone Number:       Current Level of Care: Hospital Recommended Level of Care: [place] Prior Approval Number:    Date Approved/Denied:   PASRR Number:    Discharge Plan: SNF    Current Diagnoses: Patient Active Problem List   Diagnosis Date Noted  . Pneumothorax, right   . HAP (hospital-acquired pneumonia)   . Bilateral pulmonary infiltrates on chest x-ray   . Arterial hypotension   . HCAP (healthcare-associated pneumonia)   . Acute respiratory failure with hypoxemia ([place]) 04/30/2016  . Anemia, iron deficiency 04/25/2016  . Altered mental status   . Aspiration pneumonia of right lower lobe due to gastric secretions ([place])   . Sepsis ([place]) 04/23/2016  . Pressure injury of skin 04/23/2016  . Community acquired pneumonia of right lower lobe of lung ([place]) 04/22/2016  . Absolute anemia 02/22/2015  . Closed head injury 02/22/2015  . 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 Anxiety state 02/22/2015  . 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 Depression 03/28/2007  . 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    Orientation RESPIRATION BLADDER Height & Weight     Self  Normal Incontinent Weight: 181 lb 14.1 oz (82.5 kg) Height:  6\' 2"  (188 cm)  BEHAVIORAL SYMPTOMS/MOOD NEUROLOGICAL BOWEL NUTRITION STATUS  Other (Comment) (no behaviors ) Convulsions/Seizures Incontinent Diet (DYS 1)  AMBULATORY STATUS COMMUNICATION OF NEEDS Skin   Extensive Assist Verbally PU Stage and Appropriate Care PU Stage 1 Dressing: No Dressing                      Personal Care Assistance Level of Assistance  Bathing, Feeding, Dressing Bathing Assistance: Maximum assistance Feeding assistance: Limited assistance Dressing Assistance: Maximum assistance     Functional Limitations Info  Speech, Hearing, Sight Sight Info: Adequate Hearing Info: Adequate Speech Info: Impaired    SPECIAL CARE FACTORS FREQUENCY  PT (By licensed PT)     PT Frequency: 3              Contractures      Additional Factors Info  Code Status, Allergies, Psychotropic Code Status Info: FULL CODE Allergies Info: Pineapple  Psychotropic Info: klonopin, lexapro          Current Medications (05/15/2016):  This is the current hospital active medication list Current Facility-Administered Medications  Medication Dose Route Frequency Provider Last Rate Last Dose  . 0.9 %  sodium chloride infusion  250 mL Intravenous Continuous Chesley Mires, MD 50 mL/hr at 05/14/16 0513 250 mL at 05/14/16 0513  . albuterol (PROVENTIL) (2.5 MG/3ML) 0.083% nebulizer solution 2.5 mg  2.5 mg Nebulization Q2H PRN Donita Brooks, NP      . benztropine (COGENTIN) tablet 0.5 mg  0.5 mg Oral Daily Javier Glazier, MD   0.5 mg at 05/15/16 0950  . carbamazepine (TEGRETOL) tablet 200 mg  200 mg Oral TID Javier Glazier, MD   200 mg at 05/15/16 0950  . clonazePAM (KLONOPIN) tablet 0.5 mg  0.5 mg Oral QHS Javier Glazier, MD  0.5 mg at 05/14/16 2351  . docusate sodium (COLACE) capsule 100 mg  100 mg Oral BID Javier Glazier, MD   100 mg at 05/15/16 0949  . escitalopram (LEXAPRO) tablet 10 mg  10 mg Oral Daily Javier Glazier, MD   10 mg at 05/15/16 0950  . famotidine (PEPCID) tablet 20 mg  20 mg Oral BID Javier Glazier, MD   20 mg at 05/15/16 0950  . heparin injection 5,000 Units  5,000 Units Subcutaneous Q8H Brand Males, MD   5,000 Units at 05/15/16 0536  . hydrocortisone sodium succinate (SOLU-CORTEF) 100 MG injection 50 mg  50 mg Intravenous Q8H Javier Glazier, MD    50 mg at 05/15/16 0534  . insulin aspart (novoLOG) injection 0-5 Units  0-5 Units Subcutaneous QHS Javier Glazier, MD      . insulin aspart (novoLOG) injection 0-9 Units  0-9 Units Subcutaneous TID WC Javier Glazier, MD   1 Units at 05/15/16 1141  . lamoTRIgine (LAMICTAL) tablet 100 mg  100 mg Oral BID Javier Glazier, MD   100 mg at 05/15/16 0950  . MEDLINE mouth rinse  15 mL Mouth Rinse BID Ankit Nanavati, MD   15 mL at 05/15/16 0953  . midodrine (PROAMATINE) tablet 10 mg  10 mg Oral TID WC Javier Glazier, MD   10 mg at 05/15/16 1133  . ondansetron (ZOFRAN) injection 4 mg  4 mg Intravenous Q6H PRN Donita Brooks, NP   4 mg at 05/13/16 0245  . [place]vis   Oral PRN Varney Biles, MD      . risperiDONE (RISPERDAL) tablet 1 mg  1 mg Oral BID Javier Glazier, MD   1 mg at 05/15/16 0950  . senna-docusate (Senokot-S) tablet 1 tablet  1 tablet Oral QHS Javier Glazier, MD   1 tablet at 05/14/16 2351  . sodium chloride flush (NS) 0.9 % injection 10-40 mL  10-40 mL Intracatheter Q12H Javier Glazier, MD   10 mL at 05/14/16 1000  . sodium chloride flush (NS) 0.9 % injection 10-40 mL  10-40 mL Intracatheter PRN Javier Glazier, MD   10 mL at 05/15/16 [id number]     Discharge Medications: Please see discharge summary for a list of discharge medications.  Relevant Imaging Results:  Relevant Lab Results:   Additional Information SSN [id number]  [name]

## 2016-05-15 NOTE — Clinical Social Work Placement (Signed)
   CLINICAL SOCIAL WORK PLACEMENT  NOTE  Date:  05/15/2016  Patient Details  Name: [name] MRN: [id number] Date of Birth: June 24, 1965  Clinical Social Work is seeking post-discharge placement for this patient at the [place] level of care (*CSW will initial, date and re-position this form in  chart as items are completed):      Patient/family provided with [facility] Work Department's list of facilities offering this level of care within the geographic area requested by the patient (or if unable, by the patient's family).      Patient/family informed of their freedom to choose among providers that offer the needed level of care, that participate in Medicare, Medicaid or managed care program needed by the patient, have an available bed and are willing to accept the patient.      Patient/family informed of [facility]'s ownership interest in [facility] and [facility], as well as of the fact that they are under no obligation to receive care at these facilities.  PASRR submitted to EDS on       PASRR number received on       Existing PASRR number confirmed on       FL2 transmitted to all facilities in geographic area requested by pt/family on 05/15/16     FL2 transmitted to all facilities within larger geographic area on 05/15/16     Patient informed that his/her managed care company has contracts with or will negotiate with certain facilities, including the following:            Patient/family informed of bed offers received.  Patient chooses bed at       Physician recommends and patient chooses bed at      Patient to be transferred to   on  .  Patient to be transferred to facility by       Patient family notified on   of transfer.  Name of family member notified:        PHYSICIAN Please sign FL2     Additional Comment:    _______________________________________________ Glendon Axe A 05/15/2016, 1:03 PM

## 2016-05-15 NOTE — Clinical Social Work Note (Signed)
MSW spoke with patient's sister, Orson Slick at [phone] and was informed that TBI occurred at the age of 12yrs old. Pt's sister also confirmed that patient is wheelchair bound however family is agreeable to SNF placement for STR. Pt's mother, Nunzio Cory and family will arrive Friday, 12/22 morning at bedside to further discuss discharge planning.   MSW remains available as needed.   Glendon Axe, MSW [phone] 05/15/2016 4:09 PM

## 2016-05-15 NOTE — Care Management Important Message (Signed)
Important Message  Patient Details  Name: [name] MRN: [id number] Date of Birth: 1966/05/13   Medicare Important Message Given:  Yes    Kerin Salen 05/15/2016, 10:02 AMImportant Message  Patient Details  Name: [name] MRN: [id number] Date of Birth: 03-May-1966   Medicare Important Message Given:  Yes    Kerin Salen 05/15/2016, 10:02 AM

## 2016-05-15 NOTE — Progress Notes (Signed)
PROGRESS NOTE    [name]  [id number] DOB: Jul 18, 1965 DOA: 04/30/2016 PCP: [name], MD   Brief Narrative:   50 y.o. male, group home resident, with PMH of mental delay & recent hospitalization for RLL PNA admitted 12/6 with diffuse infiltrates, hypothermia and concern for sepsis.  He self extubated on 12/9, but was then re-intubated in the setting of increased work of breathing and R lung atelectasis and likely aspiration pneumonia on 12/11, had a pneumothorax post intubation. Patient had nausea & emesis of tube feedings overnight 12/18. Respiratory status is continuing to improve with decreasing oxygen requirement. Tolerating diet. Shock has resolved and blood pressure has remained stable as well. Patient's acute encephalopathy is resolving. Continuing treatment for possible adrenal insufficiency with Solu-Medrol IV every 8 hours for now.   Assessment & Plan:   Active Problems:   Acute respiratory failure with hypoxemia (HCC) - resolved. Pt on room air.    HAP (hospital-acquired pneumonia) - in house completed 12 days of antibiotic therapy - Pulmonologist recommending repeat chest x-ray in 4-6 weeks to ensure resolution of lung opacities.  Acute encephalopathy - improving and most likely related to hypoxia and toxic metabolic encephalopathy  Hyperglycemia -Secondary to steroids currently on sensitive sliding scale - hgb a1c at 6.1  Suspected adrenal insufficiency -Patient started on Solu-Cortef 50 mg IV morphine to every 12 hours    Pneumothorax, right - Pt breathing comfortably on room air. Critical care transferred service 05/15/16 - Resolved. Chest tube removed 12/14  Seizure Disorder:  No recent seizure activity. Will continue home Klonopin, Tegretol, & Lamictal.   DVT prophylaxis:Heparin Code Status: Full Family Communication: none at bedside Disposition Plan: floor   Consultants:   None   Procedures/significant events:  11/28 - 12/1 > Admission for  CAP vs Aspiration PNA > D/C with 7 day course of Levaquin  .....................Marland Kitchen 12/06 - readmit 12/08 - bloody secretions from ETT.  Pt did not receive blood overnight due to consent issues.  Hgb down to 6.4.  Neo @ 15  12/09 - SBT. No hemoptysis. No sedation gtt >calm. Neo @ 73mcg. 2L +. Fever curve better. Self extubated 12/10 -  Neo, increased O2/4L negative. Vomited; re-intubated by anesthesia, R lung pneumothorax, chest tube placed 12/13 - extubated  12/14 - remains on neo, chest tube removed 12/15 - remains on neo 12/17 - off neo 12/18 - nausea with emesis and mild aspiration from tube feedings  STUDIES:  CXR 12/6: Severe diffuse bilateral airspace disease. Small right effusion  TTE 12/6: LVEF 60% Bronchoscopy 12/7: likely traumatic abrasion to trachea from ETT TTE 12/7: normal LVEF 60-65%, Could not estimate RVSP TTE 12/14: normal LVEF LE Doppler 12/14: no DVT CT Angiogram 12/15: diffuse RUL infiltrate  Port CXR 12/18:  Personally reviewed by me. No new opacity. Feeding tube coiled and looped in esophagus. Significantly different from previous positioning on abdominal film after replacement.    Antimicrobials: None   Subjective: Pt has no new complaints  Objective: Vitals:   05/14/16 2115 05/15/16 0525 05/15/16 1145 05/15/16 1405  BP: (!) 110/59 (!) 96/55 (!) 100/56 (!) 95/59  Pulse: 89 91 95 87  Resp: 15 15 18 16   Temp: 97.9 F (36.6 C) 98.5 F (36.9 C) 98.2 F (36.8 C) 97.5 F (36.4 C)  TempSrc: Oral Oral  Axillary  SpO2: 98% 94% 96% 97%  Weight:  82.5 kg (181 lb 14.1 oz)    Height:        Intake/Output Summary (Last 24  hours) at 05/15/16 1551 Last data filed at 05/15/16 1300  Gross per 24 hour  Intake             1350 ml  Output             1500 ml  Net             -150 ml   Filed Weights   05/13/16 0500 05/14/16 0433 05/15/16 0525  Weight: 80.8 kg (178 lb 2.1 oz) 78.6 kg (173 lb 4.5 oz) 82.5 kg (181 lb 14.1 oz)    Examination:  General exam:  Appears calm and comfortable, in nad. Respiratory system: Clear to auscultation. Respiratory effort normal. No wheezes Cardiovascular system: S1 & S2 heard, RRR. No JVD. Gastrointestinal system: Abdomen is nondistended, soft and nontender. No organomegaly or masses felt. Normal bowel sounds heard. Central nervous system: Alert and Awake. No facial asymmetry Extremities: equal tone, no cyanosis. Skin: No rashes, lesions or ulcers, on limited exam. Psychiatry: Mood & affect appropriate. Difficult exam due to MR  Data Reviewed: I have personally reviewed following labs   CBC:  Recent Labs Lab 05/11/16 0433 05/12/16 0426 05/13/16 0500 05/14/16 0410 05/15/16 0518  WBC 7.7 7.4 9.4 8.9 7.0  NEUTROABS  --   --  8.4* 6.6 5.2  HGB 8.0* 7.8* 8.2* 7.3* 7.3*  HCT 26.0* 25.3* 26.7* 24.0* 24.6*  MCV 93.9 94.4 92.7 93.8 93.2  PLT 828* 769* 834* 716* [id number]*   Basic Metabolic Panel:  Recent Labs Lab 05/10/16 0551 05/10/16 1702 05/11/16 0433 05/12/16 0426 05/13/16 0500 05/14/16 0410 05/15/16 0518  NA 141  --  141 143 145 144 142  K 4.1  --  3.6 3.5 4.0 3.5 4.0  CL 102  --  107 106 105 109 108  CO2 30  --  30 31 33* 30 28  GLUCOSE 138*  --  179* 177* 131* 101* 118*  BUN 15  --  12 13 18 20  22*  CREATININE 0.77  --  0.80 0.66 0.80 0.81 0.80  CALCIUM 8.2*  --  8.2* 8.1* 8.2* 8.2* 8.2*  MG 2.4 2.2  --   --  2.1 2.2 2.0  PHOS 3.4 2.5  --   --  3.7 3.6 3.8   GFR: Estimated Creatinine Clearance: 131.3 mL/min (by C-G formula based on SCr of 0.8 mg/dL). Liver Function Tests:  Recent Labs Lab 05/13/16 0500 05/14/16 0410 05/15/16 0518  ALBUMIN 2.7* 2.5* 2.5*   No results for input(s): LIPASE, AMYLASE in the last 168 hours. No results for input(s): AMMONIA in the last 168 hours. Coagulation Profile:  Recent Labs Lab 05/10/16 0551  INR 1.27   Cardiac Enzymes:  Recent Labs Lab 05/08/16 1657 05/08/16 2305  TROPONINI <0.03 <0.03   BNP (last 3 results) No results for input(s):  PROBNP in the last 8760 hours. HbA1C: No results for input(s): HGBA1C in the last 72 hours. CBG:  Recent Labs Lab 05/14/16 1632 05/14/16 1833 05/14/16 2119 05/15/16 0743 05/15/16 1136  GLUCAP 108* 153* 118* 113* 133*   Lipid Profile: No results for input(s): CHOL, HDL, LDLCALC, TRIG, CHOLHDL, LDLDIRECT in the last 72 hours. Thyroid Function Tests: No results for input(s): TSH, T4TOTAL, FREET4, T3FREE, THYROIDAB in the last 72 hours. Anemia Panel: No results for input(s): VITAMINB12, FOLATE, FERRITIN, TIBC, IRON, RETICCTPCT in the last 72 hours. Sepsis Labs:  Recent Labs Lab 05/09/16 0450  PROCALCITON 0.16    No results found for this or any previous visit (from the  past 240 hour(s)).   Radiology Studies: No results found.  Scheduled Meds: . benztropine  0.5 mg Oral Daily  . carbamazepine  200 mg Oral TID  . clonazePAM  0.5 mg Oral QHS  . docusate sodium  100 mg Oral BID  . escitalopram  10 mg Oral Daily  . famotidine  20 mg Oral BID  . heparin subcutaneous  5,000 Units Subcutaneous Q8H  . hydrocortisone sodium succinate  50 mg Intravenous Q8H  . insulin aspart  0-5 Units Subcutaneous QHS  . insulin aspart  0-9 Units Subcutaneous TID WC  . lamoTRIgine  100 mg Oral BID  . mouth rinse  15 mL Mouth Rinse BID  . midodrine  10 mg Oral TID WC  . risperiDONE  1 mg Oral BID  . senna-docusate  1 tablet Oral QHS  . sodium chloride flush  10-40 mL Intracatheter Q12H   Continuous Infusions: . sodium chloride 250 mL (05/14/16 0513)     LOS: 15 days    Time spent: > 35 minutes  Velvet Bathe, MD [facility] Pager [phone]  If 7PM-7AM, please contact night-coverage [email or web address] Password [facility] 05/15/2016, 3:51 PM

## 2016-05-15 NOTE — Progress Notes (Signed)
Speech Language Pathology Treatment: Dysphagia  Patient Details Name: [name] MRN: [id number] DOB: [birth year] Today's Date: 05/15/2016 Time: [id number] SLP Time Calculation (min) (ACUTE ONLY): 24 min  Assessment / Plan / Recommendation Clinical Impression  Pt today able to feed himself but still needs full supervision due to his aspiration risk from sensory dysphagia.  SLP observed pt reach for the diet soda therefore provided him with tsp of soda via cup.  Swallow followed by immediate cough - concerning for overt aspiration.  Pt's phonatory strength is not strong today - however he is able to communicate basic needs.  Pt is dysarthric and therefore did not observe him with solids.  Good tolerance of honey, and pudding consistencies.  Continued minimal delay in swallow.  Posted new swallow precaution signs to using teach back educated pt to findings/clinical recommendations  Recommend    HPI HPI: 50 y/o M with PMH of MR (baseline lives at group home, IQ ~ 50-70) and prior tracheostomy, with recent admit for RLL PNA. Patient treated and discharged on levaquin. Found 12/6 altered, hypothermic, anemic with diffuse bilateral infiltrates. Required intubation in ER. Self extubated 12/9, re-intubated until 12/13. During prior admission pt seen by SLP, resumed diet of Dys 3/nectar thick liquids that pt consumes at group home with full supervision due to impulsivity.       SLP Plan  Continue with current plan of care     Recommendations  Diet recommendations: Dysphagia 1 (puree);Honey-thick liquid Liquids provided via: Cup;No straw Medication Administration: Whole meds with puree Supervision: Patient able to self feed;Full supervision/cueing for compensatory strategies Compensations: Slow rate;Small sips/bites;Minimize environmental distractions Postural Changes and/or Swallow Maneuvers: Seated upright 90 degrees;Upright 30-60 min after meal                Oral Care Recommendations:  Oral care BID Follow up Recommendations: Home health SLP Plan: Continue with current plan of care       [place], [place], [place] [facility] SLP [phone]

## 2016-05-15 NOTE — Clinical Social Work Note (Signed)
MSW spoke with patient's caregiver/medical coordinator, [name] of [facility] in regards to discharge planning. MSW explained PT recommendation for STR at skilled level. Group home representative explained that group home does not offer PT and believes patient would benefit for STR at a higher level (SNF) before returning to group home.   MSW to fax clinicals to medical coordinator for review. FL-2 completed and faxed to SNF's for consideration.   CSW working on [employer] as it will likely be a Level II.  MSW also attempted to contact patient's mother, Nunzio Cory.   MSW remains available as needed.    Glendon Axe, MSW [phone] 05/15/2016 1:02 PM

## 2016-05-16 LAB — GLUCOSE, CAPILLARY
GLUCOSE-CAPILLARY: 102 mg/dL — AB (ref 65–99)
GLUCOSE-CAPILLARY: 109 mg/dL — AB (ref 65–99)
Glucose-Capillary: 115 mg/dL — ABNORMAL HIGH (ref 65–99)
Glucose-Capillary: 107 mg/dL — ABNORMAL HIGH (ref 65–99)

## 2016-05-16 LAB — BASIC METABOLIC PANEL
ANION GAP: 7 (ref 5–15)
BUN: 16 mg/dL (ref 6–20)
CO2: 28 mmol/L (ref 22–32)
Calcium: 7.9 mg/dL — ABNORMAL LOW (ref 8.9–10.3)
Chloride: 109 mmol/L (ref 101–111)
Creatinine, Ser: 0.74 mg/dL (ref 0.61–1.24)
GFR calc Af Amer: >60 mL/min (ref >60)
GLUCOSE: 135 mg/dL — AB (ref 65–99)
Potassium: 3.3 mmol/L — ABNORMAL LOW (ref 3.5–5.1)
SODIUM: 144 mmol/L (ref 135–145)

## 2016-05-16 LAB — MAGNESIUM: MAGNESIUM: 1.8 mg/dL (ref 1.7–2.4)

## 2016-05-16 MED ORDER — POTASSIUM CHLORIDE CRYS ER 20 MEQ PO TBCR
40.0000 meq | EXTENDED_RELEASE_TABLET | Freq: Every day | ORAL | Status: DC
  Administered 2016-05-16 – 2016-05-17 (×2): 40 meq via ORAL
  Filled 2016-05-16 (×2): qty 2

## 2016-05-16 MED ORDER — HYDROCORTISONE 5 MG PO TABS
5.0000 mg | ORAL_TABLET | Freq: Every day | ORAL | Status: DC
  Administered 2016-05-17: 5 mg via ORAL
  Filled 2016-05-16: qty 1

## 2016-05-16 MED ORDER — HYDROCORTISONE 10 MG PO TABS
10.0000 mg | ORAL_TABLET | Freq: Every day | ORAL | Status: DC
  Administered 2016-05-17: 10 mg via ORAL
  Filled 2016-05-16: qty 1

## 2016-05-16 NOTE — Progress Notes (Signed)
PROGRESS NOTE    [name]  [id number] DOB: February 03, 1966 DOA: 04/30/2016 PCP: [name], MD    Brief Narrative:  50 y.o.male, group home resident, with PMH of mental delay &recent hospitalization for RLL PNA admitted 12/6 with diffuse infiltrates, hypothermia and concern for sepsis. He self extubated on 12/9, but was then re-intubated in the setting of increased work of breathing and R lung atelectasis and likely aspiration pneumonia on 12/11, had a pneumothorax post intubation. Patient had nausea &emesis of tube feedings overnight12/18. Respiratory status is continuing to improve with decreasing oxygen requirement. Tolerating diet. Shock has resolved and blood pressure has remained stable as well. Patient's acute encephalopathy is resolving. Continuing treatment for possible adrenal insufficiency with Solu-Medrol IV every 8 hours for now.    Assessment & Plan:   Active Problems:   Acute respiratory failure with hypoxemia (HCC)   HAP (hospital-acquired pneumonia)   Bilateral pulmonary infiltrates on chest x-ray   Arterial hypotension   HCAP (healthcare-associated pneumonia)   Pneumothorax, right   Acute respiratory failure with hypoxemia ([place]) - resolved. Pt on room air.    HAP (hospital-acquired pneumonia) - in house completed 12 days of antibiotic therapy - Pulmonologist recommending repeat chest x-ray in 4-6 weeks to ensure resolution of lung opacities.  Acute encephalopathy - improving and most likely related to hypoxia and toxic metabolic encephalopathy - per family patient appears back to baseline  Hyperglycemia -Secondary to steroids currently on sensitive sliding scale - hgb a1c at 6.1  Suspected adrenal insufficiency -Patient started on Solu-Cortef 50 mg IV morphine to every 12 hours - will need follow up with endocrinology outpatient - will discuss transition to PO steroids possibly in am    Pneumothorax, right - Pt breathing comfortably on room  air. Critical care transferred service 05/15/16 - Resolved. Chest tube removed 12/14  Seizure Disorder - No recent seizure activity - Will continue home Klonopin, Tegretol, &Lamictal.   DVT prophylaxis:Heparin Code Status: Full Family Communication: none at bedside Disposition Plan: floor   Consultants:   SLP, PT/OT  Procedures:  11/28 - 12/1 >Admission for CAP vs Aspiration PNA >D/C with 7 day course of Levaquin  12/06 - readmit 12/08 -bloody secretions from ETT. Pt did not receive blood overnight due to consent issues. Hgb down to 6.4. Neo @ 15  12/09 - SBT. No hemoptysis. No sedation gtt >calm. Neo @ 44mcg. 2L +. Fever curve better. Self extubated 12/10 - Neo, increased O2/4L negative. Vomited; re-intubated by anesthesia, R lung pneumothorax, chest tube placed 12/13 - extubated  12/14 - remains on neo, chest tube removed 12/15 - remains on neo 12/17 - off neo 12/18 - nausea with emesis and mild aspiration from tube feedings  Antimicrobials:   None    Subjective: Patient seen and evaluated.  Mother and sister are bedside.  Repeatedly they state they don't want patient rushed out of the hospital as he was previously.  They are open to SNF for short term rehab only.  Patient early voiced complaint of leg cramps to nursing staff.  Objective: Vitals:   05/15/16 1405 05/15/16 2114 05/16/16 0500 05/16/16 1408  BP: (!) 95/59 103/64 101/60 (!) 90/54  Pulse: 87 97 88 98  Resp: 16 16 17 16   Temp: 97.5 F (36.4 C) 98.8 F (37.1 C) 98.1 F (36.7 C) 98.9 F (37.2 C)  TempSrc: Axillary Oral Oral Oral  SpO2: 97% 96% 94% 96%  Weight:   84.2 kg (185 lb 10 oz)   Height:  Intake/Output Summary (Last 24 hours) at 05/16/16 1737 Last data filed at 05/16/16 1703  Gross per 24 hour  Intake          1249.17 ml  Output              300 ml  Net           949.17 ml   Filed Weights   05/14/16 0433 05/15/16 0525 05/16/16 0500  Weight: 78.6 kg (173 lb 4.5 oz) 82.5 kg  (181 lb 14.1 oz) 84.2 kg (185 lb 10 oz)    Examination:  General exam: Appears calm and comfortable  Respiratory system: Clear to auscultation. Respiratory effort normal. Cardiovascular system: S1 & S2 heard, RRR. No JVD, murmurs, rubs, gallops or clicks. No pedal edema. Gastrointestinal system: Abdomen is nondistended, soft and nontender. No organomegaly or masses felt. Normal bowel sounds heard. Central nervous system: Alert and awake- did not engage with me during exam.  Extremities: equal tone, no cyanosis, clubbing or edema Skin: No rashes, lesions or ulcers Psychiatry: unable to evaluate.     Data Reviewed: I have personally reviewed following labs and imaging studies  CBC:  Recent Labs Lab 05/11/16 0433 05/12/16 0426 05/13/16 0500 05/14/16 0410 05/15/16 0518  WBC 7.7 7.4 9.4 8.9 7.0  NEUTROABS  --   --  8.4* 6.6 5.2  HGB 8.0* 7.8* 8.2* 7.3* 7.3*  HCT 26.0* 25.3* 26.7* 24.0* 24.6*  MCV 93.9 94.4 92.7 93.8 93.2  PLT 828* 769* 834* 716* [id number]*   Basic Metabolic Panel:  Recent Labs Lab 05/10/16 0551 05/10/16 1702  05/12/16 0426 05/13/16 0500 05/14/16 0410 05/15/16 0518 05/16/16 1056  NA 141  --   < > 143 145 144 142 144  K 4.1  --   < > 3.5 4.0 3.5 4.0 3.3*  CL 102  --   < > 106 105 109 108 109  CO2 30  --   < > 31 33* 30 28 28   GLUCOSE 138*  --   < > 177* 131* 101* 118* 135*  BUN 15  --   < > 13 18 20  22* 16  CREATININE 0.77  --   < > 0.66 0.80 0.81 0.80 0.74  CALCIUM 8.2*  --   < > 8.1* 8.2* 8.2* 8.2* 7.9*  MG 2.4 2.2  --   --  2.1 2.2 2.0 1.8  PHOS 3.4 2.5  --   --  3.7 3.6 3.8  --   < > = values in this interval not displayed. GFR: Estimated Creatinine Clearance: 131.3 mL/min (by C-G formula based on SCr of 0.74 mg/dL). Liver Function Tests:  Recent Labs Lab 05/13/16 0500 05/14/16 0410 05/15/16 0518  ALBUMIN 2.7* 2.5* 2.5*   No results for input(s): LIPASE, AMYLASE in the last 168 hours. No results for input(s): AMMONIA in the last 168  hours. Coagulation Profile:  Recent Labs Lab 05/10/16 0551  INR 1.27   Cardiac Enzymes: No results for input(s): CKTOTAL, CKMB, CKMBINDEX, TROPONINI in the last 168 hours. BNP (last 3 results) No results for input(s): PROBNP in the last 8760 hours. HbA1C: No results for input(s): HGBA1C in the last 72 hours. CBG:  Recent Labs Lab 05/15/16 1651 05/15/16 2115 05/16/16 0807 05/16/16 1139 05/16/16 1702  GLUCAP 125* 126* 102* 115* 109*   Lipid Profile: No results for input(s): CHOL, HDL, LDLCALC, TRIG, CHOLHDL, LDLDIRECT in the last 72 hours. Thyroid Function Tests: No results for input(s): TSH, T4TOTAL, FREET4, T3FREE, THYROIDAB in the  last 72 hours. Anemia Panel: No results for input(s): VITAMINB12, FOLATE, FERRITIN, TIBC, IRON, RETICCTPCT in the last 72 hours. Sepsis Labs: No results for input(s): PROCALCITON, LATICACIDVEN in the last 168 hours.  No results found for this or any previous visit (from the past 240 hour(s)).       Radiology Studies: No results found.      Scheduled Meds: . benztropine  0.5 mg Oral Daily  . carbamazepine  200 mg Oral TID  . clonazePAM  0.5 mg Oral QHS  . docusate sodium  100 mg Oral BID  . escitalopram  10 mg Oral Daily  . famotidine  20 mg Oral BID  . heparin subcutaneous  5,000 Units Subcutaneous Q8H  . hydrocortisone sodium succinate  50 mg Intravenous Q12H  . insulin aspart  0-5 Units Subcutaneous QHS  . insulin aspart  0-9 Units Subcutaneous TID WC  . lamoTRIgine  100 mg Oral BID  . mouth rinse  15 mL Mouth Rinse BID  . midodrine  10 mg Oral TID WC  . potassium chloride  40 mEq Oral Daily  . risperiDONE  1 mg Oral BID  . senna-docusate  1 tablet Oral QHS  . sodium chloride flush  10-40 mL Intracatheter Q12H   Continuous Infusions: . sodium chloride 250 mL (05/16/16 0443)     LOS: 16 days    Time spent: 30 minutes    Loretha Stapler, MD [facility] Pager [phone]  If 7PM-7AM, please contact  night-coverage [email or web address] Password [facility] 05/16/2016, 5:37 PM

## 2016-05-16 NOTE — Clinical Social Work Note (Signed)
MSW met with family: pt's mother, Nunzio Cory and sister, Jackelyn Poling present at bedside in regards to SNF placement. MSW explained PT recommendation and need for STR at skilled level. Patient's mother and sister agreeable to STR at SNF before patient returns to [place].   Family requesting [facility]. MSW contacted admission director who reported [facility] will not have any male beds available until the beginning of the year. Family aware.   Patient alert, lying in bed and receiving assist from RN during meeting.   [place] MUST PASARR obtained: [id number] E by CSW and expires on 06/15/2016.    No further concerns reported at this time by family. MSW will continue to follow pt and family for continued support and to facilitate pt's discharge needs once medically stable.   Glendon Axe, MSW [phone] 05/16/2016 10:51 AM

## 2016-05-16 NOTE — Progress Notes (Addendum)
Speech Language Pathology Treatment:    Patient Details Name: [name] MRN: [id number] DOB: 13-Dec-1965 Today's Date: 05/16/2016 Time: [id number] SLP Time Calculation (min) (ACUTE ONLY): 20 min  Assessment / Plan / Recommendation Clinical Impression  Upon SLP walking into room, pt was leaning to the right in his chair eating his lunch.  SLP repositioned pt and assisted him to finish his meal.  Skilled meal observation included determining readiness for dietary advancement.  SLP observed pt with honey thick juice, milk, pudding and a few bites of sugar cookie.    Pt does take large liquid boluses and benefited from max cues to take small sips.    Overall good tolerance of po although with poor mastication abilities with solids due to his lingual weakness/discoordination from his MR.  Poor lingual control likely allowed premature spillage of liquid into pharynx with overt cough - concerning for laryngeal penetration.  SLP tested solid food tolerance to determine if pt would be appropriate for pleasure feeding of soft solids.  Due to concern for solid aspiration - recommend continue puree diet.  Educated pt to recommendations using teach back for basic precautions.    Advised RN to recommendation that pt have FULL supervision with intake but allow him to self feed to enhance airway protection/compensation strategies.  Of note, pt frequently belching during meals and he admitted to some reflux.  Recommend strict asp/reflux precautions. .  Posted new swallow precaution sign to indicate need/recommendation.    HPI HPI: 50 y/o M with PMH of MR (baseline lives at group home, IQ ~ 50-70) and prior tracheostomy, with recent admit for RLL PNA. Patient treated and discharged on levaquin. Found 12/6 altered, hypothermic, anemic with diffuse bilateral infiltrates. Required intubation in ER. Self extubated 12/9, re-intubated until 12/13. During prior admission pt seen by SLP, resumed diet of Dys 3/nectar thick  liquids that pt consumes at group home with full supervision due to impulsivity.       SLP Plan  Continue with current plan of care     Recommendations  Diet recommendations: Dysphagia 1 (puree);Honey-thick liquid Liquids provided via: Cup;No straw Medication Administration: Whole meds with puree Supervision: Patient able to self feed;Full supervision/cueing for compensatory strategies Compensations: Slow rate;Small sips/bites;Minimize environmental distractions Postural Changes and/or Swallow Maneuvers: Seated upright 90 degrees;Upright 30-60 min after meal                Oral Care Recommendations: Oral care BID Follow up Recommendations: Home health SLP Plan: Continue with current plan of care       [place], [place] [facility] SLP [phone]

## 2016-05-16 NOTE — Clinical Social Work Placement (Signed)
Patient has bed at SNF, [facility] once medically stable for discharge.   CLINICAL SOCIAL WORK PLACEMENT  NOTE  Date:  05/16/2016  Patient Details  Name: [name] MRN: [id number] Date of Birth: [birth year]  Clinical Social Work is seeking post-discharge placement for this patient at the [place] level of care (*CSW will initial, date and re-position this form in  chart as items are completed):  Yes   Patient/family provided with [facility] Work Department's list of facilities offering this level of care within the geographic area requested by the patient (or if unable, by the patient's family).  Yes   Patient/family informed of their freedom to choose among providers that offer the needed level of care, that participate in Medicare, Medicaid or managed care program needed by the patient, have an available bed and are willing to accept the patient.  Yes   Patient/family informed of [facility]'s ownership interest in [facility] and [facility], as well as of the fact that they are under no obligation to receive care at these facilities.  PASRR submitted to EDS on 05/16/16     PASRR number received on 05/16/16     Existing PASRR number confirmed on       FL2 transmitted to all facilities in geographic area requested by pt/family on 05/15/16     FL2 transmitted to all facilities within larger geographic area on 05/15/16     Patient informed that his/her managed care company has contracts with or will negotiate with certain facilities, including the following:        Yes   Patient/family informed of bed offers received.  Patient chooses bed at  [facility] and [facility] )     Physician recommends and patient chooses bed at      Patient to be transferred to  [facility]) on  .  Patient to be transferred to facility by       Patient family notified on   of transfer.  Name of family  member notified:        PHYSICIAN Please sign FL2, Please prepare priority discharge summary, including medications     Additional Comment:    _______________________________________________ [name] 05/16/2016, 2:27 PM

## 2016-05-16 NOTE — Progress Notes (Signed)
Physical Therapy Treatment Patient Details Name: MATTIA ROCCA MRN: [id number] DOB: 22-Jul-1965 Today's Date: 05/16/2016    History of Present Illness 50 y.o. male with PMH of MR (baseline lives at group home), and prior tracheostomy, with recent admit for RLL PNA.  Marland Kitchen  Found 12/6 altered, hypothermic, anemic with diffuse bilateral infiltrates.  Required intubation in ER 12/6    PT Comments    Pt more communicative today and agreeable to get OOB to recliner.  Pt also able to better assist with transferring today.  Follow Up Recommendations  SNF;Supervision/Assistance - 24 hour     Equipment Recommendations  None recommended by PT    Recommendations for Other Services       Precautions / Restrictions Precautions Precautions: Fall    Mobility  Bed Mobility Overal bed mobility: Needs Assistance Bed Mobility: Supine to Sit     Supine to sit: Mod assist;HOB elevated     General bed mobility comments: pt assisting as much as possible, pt able to assist moving LEs over EOB, more assist required for trunk  Transfers Overall transfer level: Needs assistance   Transfers: Stand Pivot Transfers   Stand pivot transfers: Mod assist;From elevated surface;+2 safety/equipment       General transfer comment: set up room for bed to drop arm recliner transfers however pt able to take weight through LEs and assisted with rise and hold standing position, knees blocked for safety, performed stand pivot to recliner then one more stand from recliner to add bed pad (soaked linen revealed during transfer)  Ambulation/Gait                 Stairs            Wheelchair Mobility    Modified Rankin (Stroke Patients Only)       Balance                                    Cognition Arousal/Alertness: Awake/alert Behavior During Therapy: WFL for tasks assessed/performed Overall Cognitive Status: History of cognitive impairments - at baseline                  General Comments: pt able to communicate better today, short phrases, a little slow to process and reply however possibly baseline (able to always respond with yes/no questions)    Exercises      General Comments        Pertinent Vitals/Pain Pain Assessment: No/denies pain    Home Living                      Prior Function            PT Goals (current goals can now be found in the care plan section) Progress towards PT goals: Progressing toward goals    Frequency    Min 3X/week      PT Plan Current plan remains appropriate    Co-evaluation             End of Session Equipment Utilized During Treatment: Gait belt Activity Tolerance: Patient tolerated treatment well Patient left: in chair;with call bell/phone within reach;with chair alarm set;with nursing/sitter in room     Time: 1126-1140 PT Time Calculation (min) (ACUTE ONLY): 14 min  Charges:  $Therapeutic Activity: 8-22 mins  G Codes:      Crislyn Willbanks,KATHrine E 06/01/16, 12:58 PM Carmelia Bake, PT, DPT 2016/06/01 Pager: [phone]

## 2016-05-17 DIAGNOSIS — E2740 Unspecified adrenocortical insufficiency: Secondary | ICD-10-CM

## 2016-05-17 LAB — BASIC METABOLIC PANEL
Anion gap: 6 (ref 5–15)
BUN: 16 mg/dL (ref 6–20)
CHLORIDE: 107 mmol/L (ref 101–111)
CO2: 29 mmol/L (ref 22–32)
Calcium: 8.3 mg/dL — ABNORMAL LOW (ref 8.9–10.3)
Creatinine, Ser: 0.62 mg/dL (ref 0.61–1.24)
GFR calc non Af Amer: >60 mL/min (ref >60)
Glucose, Bld: 144 mg/dL — ABNORMAL HIGH (ref 65–99)
POTASSIUM: 3.5 mmol/L (ref 3.5–5.1)
SODIUM: 142 mmol/L (ref 135–145)

## 2016-05-17 LAB — GLUCOSE, CAPILLARY
GLUCOSE-CAPILLARY: 127 mg/dL — AB (ref 65–99)
Glucose-Capillary: 90 mg/dL (ref 65–99)
Glucose-Capillary: 98 mg/dL (ref 65–99)

## 2016-05-17 MED ORDER — DOCUSATE SODIUM 100 MG PO CAPS: 100.0000 mg | ORAL_CAPSULE | Freq: Two times a day (BID) | ORAL | 0 refills | Status: DC

## 2016-05-17 MED ORDER — SENNOSIDES-DOCUSATE SODIUM 8.6-50 MG PO TABS: 1.0000 | ORAL_TABLET | Freq: Every evening | ORAL | 0 refills | Status: DC | PRN

## 2016-05-17 MED ORDER — CLONAZEPAM 0.5 MG PO TABS: 0.5000 mg | ORAL_TABLET | Freq: Every day | ORAL | 0 refills | Status: DC

## 2016-05-17 MED ORDER — MIDODRINE HCL 10 MG PO TABS: 10.0000 mg | ORAL_TABLET | Freq: Three times a day (TID) | ORAL | 0 refills | Status: AC

## 2016-05-17 MED ORDER — HYDROCORTISONE 10 MG PO TABS: 10.0000 mg | ORAL_TABLET | Freq: Every day | ORAL | 0 refills | Status: DC

## 2016-05-17 MED ORDER — HYDROCORTISONE 5 MG PO TABS: 5.0000 mg | ORAL_TABLET | Freq: Every day | ORAL | 0 refills | Status: DC

## 2016-05-17 MED ORDER — FAMOTIDINE 20 MG PO TABS: 20.0000 mg | ORAL_TABLET | Freq: Two times a day (BID) | ORAL | 0 refills | Status: DC

## 2016-05-17 MED ORDER — CARBAMAZEPINE 200 MG PO TABS: 200.0000 mg | ORAL_TABLET | Freq: Three times a day (TID) | ORAL | 0 refills | Status: DC

## 2016-05-17 MED ORDER — LAMOTRIGINE 100 MG PO TABS: 100.0000 mg | ORAL_TABLET | Freq: Two times a day (BID) | ORAL | 0 refills | Status: DC

## 2016-05-17 NOTE — Clinical Social Work Placement (Signed)
   CLINICAL SOCIAL WORK PLACEMENT  NOTE  Date:  05/17/2016  Patient Details  Name: [name] MRN: [id number] Date of Birth: 25-May-1966  Clinical Social Work is seeking post-discharge placement for this patient at the [place] level of care (*CSW will initial, date and re-position this form in  chart as items are completed):  Yes   Patient/family provided with [facility] Work Department's list of facilities offering this level of care within the geographic area requested by the patient (or if unable, by the patient's family).  Yes   Patient/family informed of their freedom to choose among providers that offer the needed level of care, that participate in Medicare, Medicaid or managed care program needed by the patient, have an available bed and are willing to accept the patient.  Yes   Patient/family informed of [facility]'s ownership interest in [facility] and [facility], as well as of the fact that they are under no obligation to receive care at these facilities.  PASRR submitted to EDS on 05/16/16     PASRR number received on 05/16/16     Existing PASRR number confirmed on       FL2 transmitted to all facilities in geographic area requested by pt/family on 05/15/16     FL2 transmitted to all facilities within larger geographic area on 05/15/16     Patient informed that his/her managed care company has contracts with or will negotiate with certain facilities, including the following:        Yes   Patient/family informed of bed offers received.  Patient chooses bed at  [facility] )     Physician recommends and patient chooses bed at      Patient to be transferred to  [facility]) on 05/17/16.  Patient to be transferred to facility by Ambulance     Patient family notified on 05/17/16 of transfer.  Name of family member notified:  Nunzio Cory     PHYSICIAN Please sign FL2, Please  prepare priority discharge summary, including medications     Additional Comment:  Per MD patient ready for DC to [facility]. RN, patient, patient's family, and facility notified of DC. RN given number for report. DC packet on chart. Ambulance transport requested for patient. CSW signing off.   _______________________________________________ Rigoberto Noel, LCSW 05/17/2016, 3:11 PM

## 2016-05-17 NOTE — Discharge Summary (Addendum)
Physician Discharge Summary  [name] [id number] DOB: February 10, 1966 DOA: 04/30/2016  PCP: [name], MD  Admit date: 04/30/2016 Discharge date: 05/17/2016  Admitted From: Home Disposition:  SNF- [facility]  Recommendations for Outpatient Follow-up:  1. Follow up with PCP in 1-2 weeks 2. Please set up care with Endocrinologist Dr. Cruzita Lederer for evaluation of your adrenal insufficiency 3. Please obtain BMP/CBC in two days 4. Take medications as prescribed 5. Dysphagia 1 diet 6. PT/OT/SLP per facility protocol  Home Health: No Equipment/Devices: None  Discharge Condition: Stable CODE STATUS: Full code Diet recommendation: Dysphagia 1 diet  Brief/Interim Summary: 50 y.o.male, group home resident, with PMH of mental delay &recent hospitalization for RLL PNA admitted 12/6 with diffuse infiltrates, hypothermia and concern for sepsis. He self extubated on 12/9, but was then re-intubated in the setting of increased work of breathing and R lung atelectasis and likely aspiration pneumonia on 12/11, had a pneumothorax post intubation. Patient had nausea &emesis of tube feedings overnight12/18. Respiratory status is continuing to improve with decreasing oxygen requirement. Tolerating diet. Shock has resolved and blood pressure has remained stable as well. Patient's acute encephalopathy is resolving. Patient diagnosed with adrenal insufficiency and started on IV steroids.  Discussed on phone with endocrinology who encouraged follow up outpatient as well as transitioning to PO steroids.  Referral made to Endocrinology.  Significant Events: 11/28 - 12/1 >Admission for CAP vs Aspiration PNA >D/C with 7 day course of Levaquin  12/06 - readmit 12/08 -bloody secretions from ETT. Pt did not receive blood overnight due to consent issues. Hgb down to 6.4. Neo @ 15  12/09 - SBT. No hemoptysis. No sedation gtt >calm. Neo @ 47mcg. 2L +. Fever curve better. Self extubated 12/10 - Neo, increased  O2/4L negative. Vomited; re-intubated by anesthesia, R lung pneumothorax, chest tube placed 12/13 - extubated  12/14 - remains on neo, chest tube removed 12/15 - remains on neo 12/17 - off neo 12/18 - nausea with emesis and mild aspiration from tube feedings  Discharge Diagnoses:  Active Problems:   Acute respiratory failure with hypoxemia (HCC)   HAP (hospital-acquired pneumonia)   Bilateral pulmonary infiltrates on chest x-ray   Arterial hypotension   HCAP (healthcare-associated pneumonia)   Pneumothorax, right    Discharge Instructions  Discharge Instructions    Activity as tolerated - No restrictions    Complete by:  As directed    Ambulatory referral to Endocrinology    Complete by:  As directed    Dr. Philemon Kingdom at [facility] [address], [place], [place] [address] Phone: [phone]   Call MD for:  difficulty breathing, headache or visual disturbances    Complete by:  As directed    Call MD for:  extreme fatigue    Complete by:  As directed    Call MD for:  hives    Complete by:  As directed    Call MD for:  persistant dizziness or light-headedness    Complete by:  As directed    Call MD for:  persistant nausea and vomiting    Complete by:  As directed    Call MD for:  severe uncontrolled pain    Complete by:  As directed    Call MD for:  temperature >100.4    Complete by:  As directed    Discharge diet:    Complete by:  As directed    Dysphagia 1 diet   Discharge instructions    Complete by:  As directed  Diet recommendations: Dysphagia 1 (puree);Honey-thick liquid Liquids provided via: Cup;No straw Medication Administration: Whole meds with puree Supervision: Patient able to self feed;Full supervision/cueing for compensatory strategies. Compensations: Slow rate;Small sips/bites;Minimize environmental distractions. Postural Changes and/or Swallow Maneuvers: Seated upright 90 degrees;Upright 30-60 min after meal   Increase activity  slowly    Complete by:  As directed    Walk with assistance    Complete by:  As directed      Allergies as of 05/17/2016      Reactions   Pineapple Anaphylaxis      Medication List    STOP taking these medications   carbamazepine 300 MG 12 hr capsule Commonly known as:  CARBATROL Replaced by:  carbamazepine 200 MG tablet   ferrous sulfate 325 (65 FE) MG tablet   LamoTRIgine XR 200 MG Tb24 Replaced by:  lamoTRIgine 100 MG tablet   levofloxacin 750 MG tablet Commonly known as:  LEVAQUIN   tamsulosin 0.4 MG Caps capsule Commonly known as:  FLOMAX   traZODone 50 MG tablet Commonly known as:  DESYREL   zolpidem 10 MG tablet Commonly known as:  AMBIEN     TAKE these medications   benztropine 0.5 MG tablet Commonly known as:  COGENTIN Take 0.5 mg by mouth daily.   carbamazepine 200 MG tablet Commonly known as:  TEGRETOL Take 1 tablet (200 mg total) by mouth 3 (three) times daily. Replaces:  carbamazepine 300 MG 12 hr capsule   clonazePAM 0.5 MG tablet Commonly known as:  KLONOPIN Take 0.5 mg by mouth at bedtime. And 1 tablet as needed for anxiety   docusate sodium 100 MG capsule Commonly known as:  COLACE Take 1 capsule (100 mg total) by mouth 2 (two) times daily.   escitalopram 10 MG tablet Commonly known as:  LEXAPRO Take 10 mg by mouth daily.   famotidine 20 MG tablet Commonly known as:  PEPCID Take 1 tablet (20 mg total) by mouth 2 (two) times daily.   hydrocortisone 10 MG tablet Commonly known as:  CORTEF Take 1 tablet (10 mg total) by mouth daily with breakfast. Please give at 7am every morning Start taking on:  05/18/2016   hydrocortisone 5 MG tablet Commonly known as:  CORTEF Take 1 tablet (5 mg total) by mouth daily at 12 noon. Start taking on:  05/18/2016   lamoTRIgine 100 MG tablet Commonly known as:  LAMICTAL Take 1 tablet (100 mg total) by mouth 2 (two) times daily. Replaces:  LamoTRIgine XR 200 MG Tb24   loratadine 10 MG  tablet Commonly known as:  CLARITIN Take 10 mg by mouth daily.   midodrine 10 MG tablet Commonly known as:  PROAMATINE Take 1 tablet (10 mg total) by mouth 3 (three) times daily with meals.   pantoprazole 40 MG tablet Commonly known as:  PROTONIX Take 40 mg by mouth daily.   risperiDONE 2 MG tablet Commonly known as:  RISPERDAL Take 1 mg by mouth 2 (two) times daily.   senna-docusate 8.6-50 MG tablet Commonly known as:  Senokot-S Take 1 tablet by mouth at bedtime as needed for mild constipation.       Contact information for follow-up providers    ANDY,CAMILLE L, MD. Schedule an appointment as soon as possible for a visit in 1 week(s).   Specialty:  Family Medicine Contact information: [place] [address] [phone]            Contact information for after-discharge care  Destination    [facility] Follow up.   Specialty:  [place] information: [address] [address] [phone]                 Allergies  Allergen Reactions  . Pineapple Anaphylaxis    Consultations:  PCCM  PT/OT/SLP  Social work   Procedures/Studies: Dg Chest 1 View  Result Date: 05/03/2016 CLINICAL DATA:  Recent RIGHT lower lobe pneumonia, concern for possible aspiration during orogastric tube feeding, former smoker EXAM: CHEST 1 VIEW COMPARISON:  Portable exam 1208 hours compared to 0450 hours FINDINGS: Tip of endotracheal tube projects 3.8 cm above carina. Nasogastric tube extends into stomach. RIGHT arm PICC line tip projects over RIGHT atrium though this may be accentuated by positioning, rotation and kyphosis. Enlargement of cardiac silhouette. BILATERAL pulmonary infiltrates RIGHT greater than LEFT. Probable small RIGHT pleural effusion. No definite pneumothorax. Bones demineralized with old healed RIGHT humeral diaphyseal fracture deformity. IMPRESSION:  Persistent BILATERAL pulmonary infiltrates RIGHT greater than LEFT with suspected small RIGHT pleural effusion. No significant interval change since earlier study. Electronically Signed   By: Lavonia Dana M.D.   On: 05/03/2016 12:35   Dg Chest 2 View  Result Date: 04/30/2016 CLINICAL DATA:  Code sepsis.  Cough hypothermia EXAM: CHEST  2 VIEW COMPARISON:  04/22/2016 FINDINGS: Severe diffuse bilateral airspace disease with marked progression since prior study. Airspace disease is bilateral but more prominent on the right. Air bronchograms bilaterally. Small right effusion. Chronic fracture deformity right humerus IMPRESSION: Severe diffuse bilateral airspace disease. Differential includes multifocal pneumonia versus pulmonary edema. Electronically Signed   By: Franchot Gallo M.D.   On: 04/30/2016 13:38   Dg Chest 2 View  Result Date: 04/22/2016 CLINICAL DATA:  Cough productive of yellow sputum EXAM: CHEST  2 VIEW COMPARISON:  09/11/2014 CXR, CT abdomen the lower thorax and lung bases from 12/10/2010 FINDINGS: Patchy airspace opacities are noted at the right lung base suspicious for pneumonia possibly related to aspiration. Small right effusion obscuring the right costophrenic angle and right hemidiaphragm. Minimal atelectasis at the left lung base. There is mild pulmonary vascular congestion. The heart is borderline enlarged. The patient's chin obscures the apices. Sclerotic foci involving the left seventh and eighth ribs consistent with bony callus based on prior CT. Subtle old fracture deformity also noted of the left seventh and eighth ribs. IMPRESSION: 1. Patchy airspace opacities at the right lung base suspicious for pneumonia possibly related to aspiration pneumonia. 2. Small right effusion. 3. Mild vascular congestion. 4. Left seventh and eighth rib sclerotic densities likely reflect bony callus from prior old trauma. Electronically Signed   By: Ashley Royalty M.D.   On: 04/22/2016 20:38   Dg Abd 1  View  Result Date: 05/12/2016 CLINICAL DATA:  Feeding tube placement EXAM: ABDOMEN - 1 VIEW COMPARISON:  May 09, 2016 FINDINGS: Feeding tube tip is in the distal stomach near the pylorus. Bowel gas pattern is normal. Lung bases clear. No bowel obstruction or free air evident. IMPRESSION: Feeding tube tip in distal stomach near the pylorus. Bowel gas pattern unremarkable. Electronically Signed   By: Lowella Grip III M.D.   On: 05/12/2016 11:28   Dg Abd 1 View  Result Date: 05/09/2016 CLINICAL DATA:  NG tube placement. EXAM: ABDOMEN - 1 VIEW COMPARISON:  None. FINDINGS: The bowel gas pattern is normal. Residual oral contrast throughout the abdomen. Feeding catheter is seen looping on itself in the expected  location of gastric body/gastric cardia. IMPRESSION: Enteric catheter loops on itself with tip in the expected location of the gastric cardia. Nonobstructive bowel gas pattern. Electronically Signed   By: Fidela Salisbury M.D.   On: 05/09/2016 22:53   Dg Abd 1 View  Result Date: 05/08/2016 CLINICAL DATA:  NG tube placement EXAM: ABDOMEN - 1 VIEW COMPARISON:  05/03/2016 FINDINGS: Linear atelectasis at the right base. Decreased right pleural effusion. Esophageal tube tip overlies the distal stomach. IMPRESSION: Tip of the esophageal tube overlies the distal stomach Electronically Signed   By: Donavan Foil M.D.   On: 05/08/2016 23:20   Dg Abd 1 View  Result Date: 05/03/2016 CLINICAL DATA:  NG tube placement. EXAM: ABDOMEN - 1 VIEW COMPARISON:  04/30/2016 FINDINGS: Tip and side port of the enteric tube below the diaphragm in the stomach. Previous prominent bowel loop in the central abdomen is no longer seen. No evidence of obstruction or free air. A right pleural effusion is incidentally noted. Scoliotic curvature of the spine. IMPRESSION: Tip and side port of the enteric tube below the diaphragm in the stomach. Electronically Signed   By: Jeb Levering M.D.   On: 05/03/2016 21:44    Ct Head Wo Contrast  Result Date: 04/22/2016 CLINICAL DATA:  Status post fall, with head injury. Initial encounter. EXAM: CT HEAD WITHOUT CONTRAST TECHNIQUE: Contiguous axial images were obtained from the base of the skull through the vertex without intravenous contrast. COMPARISON:  CT of the head performed 09/10/2015 FINDINGS: Brain: No evidence of acute infarction, hemorrhage, hydrocephalus, extra-axial collection or mass lesion/mass effect. Prominence of the ventricles and sulci reflects mild to moderate cortical volume loss. Chronic encephalomalacia is noted at the high left frontal lobe. Cerebellar atrophy is noted. The brainstem and fourth ventricle are within normal limits. The basal ganglia are unremarkable in appearance. No mass effect or midline shift is seen. Vascular: No hyperdense vessel or unexpected calcification. Skull: There is mild chronic deformity involving the nasal bone, with mild rightward displacement, stable from the prior study. There is no evidence of acute fracture. Postoperative change is noted at the frontal calvarium bilaterally. Sinuses/Orbits: The orbits are within normal limits. The paranasal sinuses and mastoid air cells are well-aerated. Other: No significant soft tissue abnormalities are seen. IMPRESSION: 1. No acute intracranial pathology on CT. 2. Mild to moderate cortical volume loss noted. Chronic encephalomalacia at the high left frontal lobe. 3. Mild chronic deformity of the nasal bone, with mild rightward displacement, stable from the prior study. Electronically Signed   By: Garald Balding M.D.   On: 04/22/2016 23:21   Ct Angio Chest Pe W Or Wo Contrast  Result Date: 05/09/2016 CLINICAL DATA:  Shortness of Breath EXAM: CT ANGIOGRAPHY CHEST WITH CONTRAST TECHNIQUE: Multidetector CT imaging of the chest was performed using the standard protocol during bolus administration of intravenous contrast. Multiplanar CT image reconstructions and MIPs were obtained to  evaluate the vascular anatomy. CONTRAST:  100 mL Omnipaque 300 COMPARISON:  None. FINDINGS: Cardiovascular: Thoracic aorta demonstrates mild calcification without aneurysmal dilatation. Pulmonary artery is well visualized and demonstrates a normal branching pattern. No filling defects to suggest pulmonary embolism are seen. A small air bubble is noted within the main pulmonary artery just above the pulmonic valve likely related to IV administration. Cardiac structures are mildly prominent. Mediastinum/Nodes: Thoracic inlet is within normal limits. No hilar or mediastinal lymphadenopathy is identified. Right PICC line is noted in satisfactory position. Lungs/Pleura: Small left pleural effusion is identified. No focal infiltrate  is noted on the left. Small right pleural effusion is noted as well. Diffuse infiltrate is noted in the right upper lobe. Some slight nodularity is identified. Upper Abdomen: Feeding catheter is noted extending into the stomach. No acute abnormality in the upper abdomen is seen. Musculoskeletal: Within normal limits. Review of the MIP images confirms the above findings. IMPRESSION: No evidence of pulmonary embolism. Diffuse right upper lobe infiltrate with a somewhat nodular appearance. Short-term followup with noncontrast CT following appropriate therapy is recommended. Bilateral small pleural effusions. Electronically Signed   By: Inez Catalina M.D.   On: 05/09/2016 16:36   Dg Chest Port 1 View  Result Date: 05/12/2016 CLINICAL DATA:  50 year old male with acute respiratory favor and 2 episodes of emesis. Possible aspiration. EXAM: PORTABLE CHEST 1 VIEW COMPARISON:  Chest radiograph dated 05/12/2016 FINDINGS: The patient is rotated to the left. Right-sided PICC in stable position with tip projecting over the right atrium. There has been interval displacement of the enteric tube. The tube exchange the the distal esophagus and folds back and extends up into the oropharynx and folds again  and extends into the upper esophagus. The radiopaque weighted tip of the tube is in the upper esophagus at the level above the aortic arch. Recommend removal of the tube and repositioning. Right upper lobe as well as right lung base densities again noted. Trace right pleural effusion. No pneumothorax. The cardiac silhouette is within normal limits. There is scoliosis. No acute fracture. IMPRESSION: Interval dislodging of the enteric tube extending up into the oropharynx and back down into the upper esophagus. Recommend removal of the enteric tube and repositioning. Right upper lobe and right lung base airspace infiltrate and small right pleural effusion. These results were called by telephone at the time of interpretation on 05/12/2016 at 11:47 pm to Dr. Concepcion Living who verbally acknowledged these results. Electronically Signed   By: Anner Crete M.D.   On: 05/12/2016 23:49   Dg Chest Port 1 View  Result Date: 05/12/2016 CLINICAL DATA:  Respiratory failure. EXAM: PORTABLE CHEST 1 VIEW COMPARISON:  05/11/2016. FINDINGS: Patient rotated to the right. Right PICC line and the feeding tube in stable position. Cardiomegaly. Progressive right upper lobe infiltrate. Right base atelectasis. Small right pleural effusion. No pneumothorax. IMPRESSION: 1.  Right PICC line and feeding tube in stable position. 2. Progressive right upper lobe infiltrate.  Right base atelectasis. Electronically Signed   By: Marcello Moores  Register   On: 05/12/2016 07:14   Dg Chest Port 1 View  Result Date: 05/11/2016 CLINICAL DATA:  Respiratory failure. EXAM: PORTABLE CHEST 1 VIEW COMPARISON:  05/10/2016 FINDINGS: Right PICC line remains in place, unchanged. Increasing patchy airspace disease throughout the right lung. Low lung volumes. No confluent opacity on the left. Heart is borderline in size. IMPRESSION: Increasing patchy airspace disease throughout the right lung. Low lung volumes. Electronically Signed   By: Rolm Baptise M.D.   On:  05/11/2016 07:19   Dg Chest Port 1 View  Result Date: 05/10/2016 CLINICAL DATA:  50 y/o  M; acute on chronic respiratory failure. EXAM: PORTABLE CHEST 1 VIEW COMPARISON:  05/09/2016 chest radiograph and chest CT. FINDINGS: Ill-defined opacity in the right lung apex is better characterized on prior CT. Stable cardiac silhouette. Enteric tube tip below the field of view in the abdomen. Right PICC line tip projects over the cavoatrial junction. No new focal airspace opacity. Bones are unremarkable. IMPRESSION: Stable ill-defined consolidation in the right upper lobe. No new focal airspace opacity. Electronically Signed  By: Kristine Garbe M.D.   On: 05/10/2016 01:23   Dg Chest Port 1 View  Result Date: 05/09/2016 CLINICAL DATA:  Respiratory failure EXAM: PORTABLE CHEST 1 VIEW COMPARISON:  May 08, 2016 FINDINGS: Central catheter tip is at the cavoatrial junction. Feeding tube tip is below the diaphragm. No pneumothorax. There is a nondisplaced recently appearing fracture of the right posterior third rib. There old healed rib fractures on the left. There is patchy airspace consolidation in the left lower lobe. There is mild right base atelectasis. Lungs elsewhere are clear. Heart is upper normal in size with pulmonary vascularity within normal limits. No adenopathy. IMPRESSION: Patchy airspace disease left lower lobe, concerning for focal pneumonia. Mild right base atelectasis. Stable cardiac silhouette. Tube and catheter positions as described. No pneumothorax. Recent appearing fracture posterior right third rib. Electronically Signed   By: Lowella Grip III M.D.   On: 05/09/2016 07:06   Dg Chest Port 1 View  Result Date: 05/08/2016 CLINICAL DATA:  Follow-up pneumothorax EXAM: PORTABLE CHEST 1 VIEW COMPARISON:  05/08/2016 FINDINGS: Cardiomediastinal silhouette is stable. NG tube has been removed right arm PICC line is unchanged in position. No pneumothorax. Persistent patchy airspace  disease in right upper lung. No pulmonary edema. IMPRESSION: NG tube has been removed. Stable right PICC line position. No pneumothorax. Persistent patchy airspace disease in right upper lobe. Electronically Signed   By: Lahoma Crocker M.D.   On: 05/08/2016 16:29   Dg Chest Port 1 View  Result Date: 05/08/2016 CLINICAL DATA:  50 year old male with acute respiratory failure and hypoxia. EXAM: PORTABLE CHEST 1 VIEW COMPARISON:  Chest radiograph dated 05/07/2016 FINDINGS: There has been interval retraction of the endotracheal tube. Right-sided PICC, enteric tube, and right chest tube are in stable positioning. Left lung base density appears grossly similar or slightly improved since the prior radiograph. Residual density noted in the right upper lobe similar to prior radiograph. There is no pleural effusion or pneumothorax. Stable cardiac silhouette. No acute osseous pathology. IMPRESSION: Interval removal of the endotracheal tube with slight interval improvement of the aeration of the left lung base. Persistent residual airspace densities at the left lung base and right upper lobe. Follow-up to resolution recommended. Electronically Signed   By: Anner Crete M.D.   On: 05/08/2016 06:00   Dg Chest Port 1 View  Result Date: 05/07/2016 CLINICAL DATA:  Respiratory failure. EXAM: PORTABLE CHEST 1 VIEW COMPARISON:  05/06/2016. FINDINGS: Endotracheal tube, NG tube, right PICC line stable position. Right chest tube in stable position. No pneumothorax. Cardiomegaly. Left lower lobe infiltrate atelectasis. Interim improvement of diffuse right lung infiltrate with mild residual in the right upper lobe. Small left pleural effusion. IMPRESSION: 1. Lines and tubes including right chest tube in stable position. No pneumothorax. 2.  Left lower lobe atelectasis and consolidation. 3. Interim improvement of diffuse right lung infiltrate with mild residual in the right upper lobe. Electronically Signed   By: Marcello Moores  Register    On: 05/07/2016 06:41   Dg Chest Port 1 View  Result Date: 05/06/2016 CLINICAL DATA:  Acute respiratory failure with hypoxemia. EXAM: PORTABLE CHEST 1 VIEW COMPARISON:  05/04/2016 FINDINGS: Endotracheal tube is 3.6 cm above the carina and appropriately positioned. Right arm PICC line tip near the superior cavoatrial junction. Nasogastric tube extends into the abdomen. Improved aeration in the left lung and right lung base. Persistent airspace densities in the right upper lung. There are some densities at the left lung base. Heart size is within normal limits  and stable. Stable position of the right chest pigtail drain. There may be a tiny right apical pneumothorax. IMPRESSION: Improved aeration in both lungs with residual airspace disease in the right upper lung. Stable position of the right chest tube. Question a tiny right apical pneumothorax. Endotracheal tube is appropriately positioned. Electronically Signed   By: Markus Daft M.D.   On: 05/06/2016 07:48   Dg Chest Port 1 View  Result Date: 05/04/2016 CLINICAL DATA:  RIGHT chest tube insertion EXAM: PORTABLE CHEST 1 VIEW COMPARISON:  Portable exam 1607 hours compared to 1419 hours FINDINGS: Interval placement of a pigtail RIGHT thoracostomy tube extending to apex. Interval marked reduction of RIGHT pneumothorax post thoracostomy tube insertion. Tip of endotracheal tube projects 2.2 cm above carina. Nasogastric tube extends into stomach. RIGHT arm PICC line tip projects over cavoatrial junction. Borderline enlargement of cardiac silhouette. Extensive BILATERAL pulmonary infiltrates again identified. Small RIGHT pleural effusion. No pneumothorax. Bones demineralized with multiple old LEFT rib fractures. IMPRESSION: Near complete re-expansion of RIGHT lung post thoracostomy tube insertion. Persistent diffuse BILATERAL pulmonary infiltrates and small RIGHT pleural effusion, could reflect edema or infection. Electronically Signed   By: Lavonia Dana M.D.   On:  05/04/2016 16:36   Dg Chest Port 1 View  Result Date: 05/04/2016 CLINICAL DATA:  Endotracheal tube.  Coughing up a lot of mucus. EXAM: PORTABLE CHEST 1 VIEW COMPARISON:  05/04/2016 at 10:54 a.m. FINDINGS: Endotracheal tube has been placed with tip 3.2 cm above the carina. Nasogastric tube the is coiled over the stomach with tip not visualized. Right-sided PICC line has tip at the cavoatrial junction. Patient is rotated to the left. There has been interval development of a large right-sided pneumothorax of approximately 50%. Moderate airspace opacification within the collapsed lung. Suggestion of a right-sided hydropneumothorax. Worsening opacification over the left perihilar region and medial left base likely edema with basilar atelectasis/effusion. Remainder the exam is unchanged. IMPRESSION: Interval development of large right-sided pneumothorax likely with component of hydropneumothorax. Worsening opacification of the left perihilar region suggesting edema as well as opacification of the medial left base likely basilar atelectasis/effusion. Tubes and lines as described. Endotracheal tube with tip 3 cm above the carina. Critical Value/emergent results were called by telephone at the time of interpretation on 05/04/2016 at 3:12 pm to patient's nurse, Jacklynn Lewis, who verbally acknowledged these results and will notify physician in charge. Electronically Signed   By: Marin Olp M.D.   On: 05/04/2016 15:12   Dg Chest Port 1 View  Result Date: 05/04/2016 CLINICAL DATA:  Hypoxia with weakness and altered mental status. EXAM: PORTABLE CHEST 1 VIEW COMPARISON:  05/03/2016 FINDINGS: Patient is moderately rotated to the right. Nasogastric tube is looped over the stomach as tip is not visualized. Right-sided PICC line unchanged with tip over the right atrium. Near complete opacification of the right lung with interval worsening likely due to combination of collapse and effusion as there is cut off of the  right mainstem bronchus air column. Cannot exclude underlying mass or infection. Likely mild cardiomediastinal shift to the right. The mild prominence of the left perihilar markings unchanged. Remainder of the exam is unchanged. IMPRESSION: Interval worsening of near complete opacification of the right lung with minimal the cardiomediastinal shift to the right and cut off of the right mainstem bronchus as findings are likely due to lobar collapse and effusion. Tubes and lines unchanged. Electronically Signed   By: Marin Olp M.D.   On: 05/04/2016 11:38   Dg Chest  Port 1 View  Result Date: 05/03/2016 CLINICAL DATA:  Respiratory failure. EXAM: PORTABLE CHEST 1 VIEW COMPARISON:  Chest radiograph 05/02/2016. FINDINGS: ET tube terminates in the distal trachea. Right upper extremity PICC line tip projects over the superior vena cava. Monitoring leads overlie the patient. Enteric tube courses inferior to the diaphragm. Stable cardiac and mediastinal contours. Leftward patient rotation. Grossly unchanged diffuse right lung consolidation with small layering right pleural effusion. Patchy consolidation within the left lung is unchanged. Old right humerus fracture. IMPRESSION: Grossly unchanged right-greater-than-left consolidation with small right pleural effusion. Electronically Signed   By: Lovey Newcomer M.D.   On: 05/03/2016 06:58   Dg Chest Port 1 View  Result Date: 05/02/2016 CLINICAL DATA:  Acute respiratory failure with hypoxia. EXAM: PORTABLE CHEST 1 VIEW COMPARISON:  Radiograph of May 01, 2016. FINDINGS: Stable cardiomediastinal silhouette. Endotracheal and nasogastric tubes are unchanged in position. Interval placement of right-sided PICC line with distal tip in expected position of cavoatrial junction. No pneumothorax is noted. Increased right lung opacity is noted concerning for edema or pneumonia with associated pleural effusion. Left perihilar opacity is noted concerning for pneumonia or edema. Bony  thorax is unremarkable. IMPRESSION: Stable support apparatus. Interval placement of right-sided PICC line with distal tip in expected position of cavoatrial junction. Increased right lung opacity is noted concerning for worsening edema or pneumonia with associated pleural effusion. Stable left perihilar edema or infiltrate is noted. Electronically Signed   By: Marijo Conception, M.D.   On: 05/02/2016 08:32   Dg Chest Port 1 View  Result Date: 05/01/2016 CLINICAL DATA:  Acute respiratory failure with hypoxia, hospital acquired pneumonia EXAM: PORTABLE CHEST 1 VIEW COMPARISON:  Chest x-ray of April 30, 2016 FINDINGS: The endotracheal tube has been withdrawn and its tip now lies 3.3 cm above the carina. The lungs appear better aerated. There has been interval decrease in the conspicuity of alveolar opacities throughout both lungs. There is persistent alveolar density in the right upper lobe. The interstitial markings remain increased in the mid and lower right lung and in the left lung. The heart is normal in size. The pulmonary vascularity is not clearly engorged. The esophagogastric tube tip projects below the inferior margin of the image. IMPRESSION: Improved appearance of both lungs. Decreased alveolar opacities consistent with improving pneumonia. Successful repositioning of the endotracheal tube. Electronically Signed   By: Cory  Martinique M.D.   On: 05/01/2016 10:13   Dg Chest Portable 1 View  Result Date: 04/30/2016 CLINICAL DATA:  Status post intubation EXAM: PORTABLE CHEST 1 VIEW COMPARISON:  04/30/2016 at 1325 hours FINDINGS: Preferential intubation of the right mainstem bronchus. Withdrawal approximately 4 cm is suggested. Multifocal patchy opacities, right upper lobe predominant, suspicious for multifocal pneumonia. Interstitial edema is also possible but considered less likely. No definite pleural effusions. Cardiomegaly. IMPRESSION: Preferential intubation of the right mainstem bronchus. Withdrawal  approximately 4 cm is suggested. Multifocal patchy opacities, right upper lobe predominant, suspicious for multifocal pneumonia. Electronically Signed   By: Julian Hy M.D.   On: 04/30/2016 17:04   Dg Abd Portable 1 View  Result Date: 04/30/2016 CLINICAL DATA:  OG tube placement. EXAM: PORTABLE ABDOMEN - 1 VIEW COMPARISON:  CT the abdomen and pelvis 12/10/2010 FINDINGS: OG tube is in place. Side port of the NG tube is in the stomach. The stomach is decompressed. There is a single distended loop of small bowel in the mid abdomen. Gas and stool are present throughout the colon. Levoconvex curvature is present in  the lumbar spine. IMPRESSION: 1. The side port of the NG tube is within the stomach. 2. Single dilated loop of bowel in the mid abdomen may reflect focal ileus without evidence for obstruction. Electronically Signed   By: San Morelle M.D.   On: 04/30/2016 17:27   Dg Swallowing Func-speech Pathology  Result Date: 05/14/2016 Late Entry: Test completed on 05/09/16; Report completed 05/14/16 Objective Swallowing Evaluation: Type of Study: MBS-Modified Barium Swallow Study Patient Details Name: CHADWICK KOSTRZEWSKI MRN: [id number] Date of Birth: 11-18-1965 Today's Date: 05/09/2016 Time: SLP Start Time (ACUTE ONLY): 1240-SLP Stop Time (ACUTE ONLY): 1255 SLP Time Calculation (min) (ACUTE ONLY): 15 min Past Medical History: Past Medical History: Diagnosis Date . Anxiety  . Bipolar disorder ([place])  . BPH (benign prostatic hyperplasia)  . Closed head injury  . Constipation  . Depression  . Esophageal ulcer  . Malnutrition ([place])  . Mental retardation, mild (I.Q. 50-70)  . 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 Peptic ulcer disease  . Seizures ([place])  . Smoker  Past Surgical History: Past Surgical History: Procedure Laterality Date . ESOPHAGOGASTRODUODENOSCOPY  06/17/2011  Procedure: ESOPHAGOGASTRODUODENOSCOPY (EGD);  Surgeon: Wonda Horner, MD;  Location: [facility];   Service: Endoscopy;  Laterality: N/A; . GASTRECTOMY   HPI: 50 y/o M with PMH of MR (baseline lives at group home, IQ ~ 50-70) and prior tracheostomy, with recent admit for RLL PNA. Patient treated and discharged on levaquin. Found 12/6 altered, hypothermic, anemic with diffuse bilateral infiltrates. Required intubation in ER. Self extubated 12/9, re-intubated until 12/13. During prior admission pt seen by SLP, resumed diet of Dys 3/nectar thick liquids that pt consumes at group home with full supervision due to impulsivity.  No Data Recorded Assessment / Plan / Recommendation CHL IP CLINICAL IMPRESSIONS 05/09/2016 Therapy Diagnosis Severe pharyngeal phase dysphagia;Moderate oral phase dysphagia Clinical Impression Pt demonstrates a moderate to severe oropharyngeal dysphagia. Oral phase is characterized by forward leaning posture with some anterior spillage, particularly of secretions. Pt transits bolus with lingual pumping with 50% of bolus remaining, that pt does transit for a second swallow independently. Liquids arrive and the pyriforms prior to swallow initiation, leading to aspiration before and during the swallow with all liquid textures, even with a chin tuck. Sensation is late and cough is ineffective to clear. Pt tolerated purees in this study, though aspiration before the swallow is still possible due to severity of delay. Recommend initiating a puree/pudding thick diet, no liquids though risk of aspiration of POs and secretions still high. Will follow for tolerance and diet advancement. Impact on safety and function Severe aspiration risk   CHL IP TREATMENT RECOMMENDATION 05/09/2016 Treatment Recommendations Therapy as outlined in treatment plan below   Prognosis 05/09/2016 Prognosis for Safe Diet Advancement Fair Barriers to Reach Goals -- Barriers/Prognosis Comment -- CHL IP DIET RECOMMENDATION 05/14/2016 SLP Diet Recommendations -- Liquid Administration via -- Medication Administration --  Compensations Slow rate;Small sips/bites;Minimize environmental distractions Postural Changes --   CHL IP OTHER RECOMMENDATIONS 05/09/2016 Recommended Consults -- Oral Care Recommendations Oral care BID Other Recommendations Order thickener from pharmacy;Have oral suction available   CHL IP FOLLOW UP RECOMMENDATIONS 05/14/2016 Follow up Recommendations Home health SLP   CHL IP FREQUENCY AND DURATION 05/09/2016 Speech Therapy Frequency (ACUTE ONLY) min 2x/week Treatment Duration 2 weeks      CHL IP ORAL PHASE 05/09/2016 Oral Phase Impaired Oral - Pudding Teaspoon -- Oral - Pudding Cup -- Oral - Honey Teaspoon Delayed oral transit;Decreased  bolus cohesion;Lingual pumping;Piecemeal swallowing Oral - Honey Cup -- Oral - Nectar Teaspoon Delayed oral transit;Decreased bolus cohesion;Lingual pumping;Piecemeal swallowing Oral - Nectar Cup -- Oral - Nectar Straw -- Oral - Thin Teaspoon -- Oral - Thin Cup -- Oral - Thin Straw -- Oral - Puree Delayed oral transit;Decreased bolus cohesion;Lingual pumping;Piecemeal swallowing Oral - Mech Soft -- Oral - Regular -- Oral - Multi-Consistency -- Oral - Pill -- Oral Phase - Comment --  CHL IP PHARYNGEAL PHASE 05/09/2016 Pharyngeal Phase Impaired Pharyngeal- Pudding Teaspoon -- Pharyngeal -- Pharyngeal- Pudding Cup -- Pharyngeal -- Pharyngeal- Honey Teaspoon Delayed swallow initiation-pyriform sinuses;Penetration/Aspiration during swallow Pharyngeal Material enters airway, passes BELOW cords and not ejected out despite cough attempt by patient;Material enters airway, passes BELOW cords without attempt by patient to eject out (silent aspiration) Pharyngeal- Honey Cup -- Pharyngeal -- Pharyngeal- Nectar Teaspoon Delayed swallow initiation-pyriform sinuses;Penetration/Aspiration before swallow;Moderate aspiration Pharyngeal Material enters airway, passes BELOW cords and not ejected out despite cough attempt by patient Pharyngeal- Nectar Cup -- Pharyngeal -- Pharyngeal- Nectar Straw --  Pharyngeal -- Pharyngeal- Thin Teaspoon -- Pharyngeal -- Pharyngeal- Thin Cup -- Pharyngeal -- Pharyngeal- Thin Straw -- Pharyngeal -- Pharyngeal- Puree Delayed swallow initiation-pyriform sinuses Pharyngeal -- Pharyngeal- Mechanical Soft -- Pharyngeal -- Pharyngeal- Regular -- Pharyngeal -- Pharyngeal- Multi-consistency -- Pharyngeal -- Pharyngeal- Pill -- Pharyngeal -- Pharyngeal Comment --  No flowsheet data found. No flowsheet data found. Herbie [place], [place] CCC-SLP [phone] Othelia Pulling Katherene Ponto 05/14/2016, 2:41 PM                 Subjective: Patient sitting up in chair this am.  Says he feels great.  Says his legs do not hurt and say that he is ready to go to the rehab facility.  Has a great appetite.  Called family and updated them on discussion with endocrinologist yesterday.  Patient will need to follow up with endocrinology outpatient after discharge.  Discharge Exam: Vitals:   05/16/16 2325 05/17/16 0652  BP: (!) 100/57 101/68  Pulse: 95 94  Resp: 19 18  Temp: 98.3 F (36.8 C) 97.9 F (36.6 C)   Vitals:   05/16/16 1408 05/16/16 2325 05/17/16 0652 05/17/16 0739  BP: (!) 90/54 (!) 100/57 101/68   Pulse: 98 95 94   Resp: 16 19 18    Temp: 98.9 F (37.2 C) 98.3 F (36.8 C) 97.9 F (36.6 C)   TempSrc: Oral Oral Oral   SpO2: 96% 95% 95%   Weight:    82.7 kg (182 lb 5.1 oz)  Height:        General: Pt is alert, awake, not in acute distress Cardiovascular: RRR, S1/S2 +, no rubs, no gallops Respiratory: CTA bilaterally, no wheezing, no rhonchi Abdominal: Soft, NT, ND, bowel sounds + Extremities: no edema, no cyanosis    The results of significant diagnostics from this hospitalization (including imaging, microbiology, ancillary and laboratory) are listed below for reference.     Microbiology: No results found for this or any previous visit (from the past 240 hour(s)).   Labs: BNP (last 3 results)  Recent Labs  04/30/16 1217 05/08/16 0505  BNP 184.3* 248.4*    Basic Metabolic Panel:  Recent Labs Lab 05/10/16 1702  05/13/16 0500 05/14/16 0410 05/15/16 0518 05/16/16 1056 05/17/16 1107  NA  --   < > 145 144 142 144 142  K  --   < > 4.0 3.5 4.0 3.3* 3.5  CL  --   < > 105 109 108 109 107  CO2  --   < > 33* 30 28 28 29   GLUCOSE  --   < > 131* 101* 118* 135* 144*  BUN  --   < > 18 20 22* 16 16  CREATININE  --   < > 0.80 0.81 0.80 0.74 0.62  CALCIUM  --   < > 8.2* 8.2* 8.2* 7.9* 8.3*  MG 2.2  --  2.1 2.2 2.0 1.8  --   PHOS 2.5  --  3.7 3.6 3.8  --   --   < > = values in this interval not displayed. Liver Function Tests:  Recent Labs Lab 05/13/16 0500 05/14/16 0410 05/15/16 0518  ALBUMIN 2.7* 2.5* 2.5*   No results for input(s): LIPASE, AMYLASE in the last 168 hours. No results for input(s): AMMONIA in the last 168 hours. CBC:  Recent Labs Lab 05/11/16 0433 05/12/16 0426 05/13/16 0500 05/14/16 0410 05/15/16 0518  WBC 7.7 7.4 9.4 8.9 7.0  NEUTROABS  --   --  8.4* 6.6 5.2  HGB 8.0* 7.8* 8.2* 7.3* 7.3*  HCT 26.0* 25.3* 26.7* 24.0* 24.6*  MCV 93.9 94.4 92.7 93.8 93.2  PLT 828* 769* 834* 716* 699*   Cardiac Enzymes: No results for input(s): CKTOTAL, CKMB, CKMBINDEX, TROPONINI in the last 168 hours. BNP: Invalid input(s): POCBNP CBG:  Recent Labs Lab 05/16/16 1139 05/16/16 1702 05/16/16 2324 05/17/16 0757 05/17/16 1200  GLUCAP 115* 109* 107* 90 127*   D-Dimer No results for input(s): DDIMER in the last 72 hours. Hgb A1c No results for input(s): HGBA1C in the last 72 hours. Lipid Profile No results for input(s): CHOL, HDL, LDLCALC, TRIG, CHOLHDL, LDLDIRECT in the last 72 hours. Thyroid function studies No results for input(s): TSH, T4TOTAL, T3FREE, THYROIDAB in the last 72 hours.  Invalid input(s): FREET3 Anemia work up No results for input(s): VITAMINB12, FOLATE, FERRITIN, TIBC, IRON, RETICCTPCT in the last 72 hours. Urinalysis    Component Value Date/Time   COLORURINE YELLOW 04/30/2016 1431    APPEARANCEUR CLEAR 04/30/2016 1431   LABSPEC 1.010 04/30/2016 1431   PHURINE 6.0 04/30/2016 1431   GLUCOSEU NEGATIVE 04/30/2016 1431   HGBUR NEGATIVE 04/30/2016 1431   HGBUR negative 07/31/2009 1526   BILIRUBINUR NEGATIVE 04/30/2016 1431   KETONESUR NEGATIVE 04/30/2016 1431   PROTEINUR NEGATIVE 04/30/2016 1431   UROBILINOGEN 0.2 09/11/2014 1951   NITRITE NEGATIVE 04/30/2016 1431   LEUKOCYTESUR NEGATIVE 04/30/2016 1431   Sepsis Labs Invalid input(s): PROCALCITONIN,  WBC,  LACTICIDVEN Microbiology No results found for this or any previous visit (from the past 240 hour(s)).   Time coordinating discharge: Over 30 minutes  SIGNED:   Loretha Stapler, MD  [facility] 05/17/2016, 1:10 PM Pager [phone] If 7PM-7AM, please contact night-coverage [email or web address] Password [email or web address]

## 2016-05-21 ENCOUNTER — Other Ambulatory Visit (HOSPITAL_COMMUNITY): Payer: Self-pay | Admitting: Internal Medicine

## 2016-05-21 ENCOUNTER — Encounter: Payer: Self-pay | Admitting: Adult Health

## 2016-05-21 ENCOUNTER — Non-Acute Institutional Stay (SKILLED_NURSING_FACILITY): Payer: Medicare Other | Admitting: Adult Health

## 2016-05-21 DIAGNOSIS — K5901 Slow transit constipation: Secondary | ICD-10-CM

## 2016-05-21 DIAGNOSIS — J309 Allergic rhinitis, unspecified: Secondary | ICD-10-CM | POA: Diagnosis not present

## 2016-05-21 DIAGNOSIS — E271 Primary adrenocortical insufficiency: Secondary | ICD-10-CM

## 2016-05-21 DIAGNOSIS — G40309 Generalized idiopathic epilepsy and epileptic syndromes, not intractable, without status epilepticus: Secondary | ICD-10-CM | POA: Diagnosis not present

## 2016-05-21 DIAGNOSIS — J690 Pneumonitis due to inhalation of food and vomit: Secondary | ICD-10-CM | POA: Diagnosis not present

## 2016-05-21 DIAGNOSIS — F319 Bipolar disorder, unspecified: Secondary | ICD-10-CM | POA: Diagnosis not present

## 2016-05-21 DIAGNOSIS — I959 Hypotension, unspecified: Secondary | ICD-10-CM | POA: Diagnosis not present

## 2016-05-21 DIAGNOSIS — K219 Gastro-esophageal reflux disease without esophagitis: Secondary | ICD-10-CM | POA: Diagnosis not present

## 2016-05-21 DIAGNOSIS — A419 Sepsis, unspecified organism: Secondary | ICD-10-CM

## 2016-05-21 DIAGNOSIS — R1319 Other dysphagia: Secondary | ICD-10-CM

## 2016-05-21 NOTE — Progress Notes (Signed)
Location:   [facility]   Place of Service:  SNF (31)   CODE STATUS: full code   Allergies  Allergen Reactions  . Pineapple Anaphylaxis    Chief Complaint  Patient presents with  . Hospitalization Follow-up    HPI:  He has been hospitalized for aspiration pneumonia; pneumothorax post intubation. He has been diagnosed with adrenal insuffiencey. He is here for short term rehab with the goal to return back to his group home. He is unable to fully participate in the hpi or ros.    Past Medical History:  Diagnosis Date  . Anxiety   . Bipolar disorder ([place])   . BPH (benign prostatic hyperplasia)   . Closed head injury   . Constipation   . Depression   . Esophageal ulcer   . Malnutrition ([place])   . 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 Peptic ulcer disease   . Seizures ([place])   . Smoker     Past Surgical History:  Procedure Laterality Date  . ESOPHAGOGASTRODUODENOSCOPY  06/17/2011   Procedure: ESOPHAGOGASTRODUODENOSCOPY (EGD);  Surgeon: Wonda Horner, MD;  Location: [facility];  Service: Endoscopy;  Laterality: N/A;  . GASTRECTOMY      Social History   Social History  . Marital status: Single    Spouse name: N/A  . Number of children: N/A  . Years of education: N/A   Occupational History  . Not on file.   Social History Main Topics  . Smoking status: Former Smoker    Packs/day: 0.50    Quit date: 05/26/2010  . Smokeless tobacco: Former Systems developer    Quit date: 06/08/2001  . Alcohol use No  . Drug use: No  . Sexual activity: No   Other Topics Concern  . Not on file   Social History Narrative   Patient lives at home alone w/ caretakers.   Patient does not drink caffeine.   Patient is right handed.    Family History  Problem Relation Age of Onset  . Family history unknown: Yes      VITAL SIGNS BP 106/62   Pulse 80   Temp (!) 96.6 F (35.9 C)   Resp 20   Ht 6\' 2"   (1.88 m)   Wt 182 lb 9.6 oz (82.8 kg)   SpO2 96%   BMI 23.44 kg/m   Patient's Medications  New Prescriptions   No medications on file  Previous Medications   BENZTROPINE (COGENTIN) 0.5 MG TABLET    Take 0.5 mg by mouth daily.   CARBAMAZEPINE (TEGRETOL) 200 MG TABLET    Take 1 tablet (200 mg total) by mouth 3 (three) times daily.   CLONAZEPAM (KLONOPIN) 0.5 MG TABLET    Take 0.5 mg by mouth at bedtime.   DOCUSATE SODIUM (COLACE) 100 MG CAPSULE    Take 1 capsule (100 mg total) by mouth 2 (two) times daily.   ESCITALOPRAM (LEXAPRO) 10 MG TABLET    Take 10 mg by mouth daily.   FAMOTIDINE (PEPCID) 20 MG TABLET    Take 1 tablet (20 mg total) by mouth 2 (two) times daily.   HYDROCORTISONE (CORTEF) 10 MG TABLET    Take 1 tablet (10 mg total) by mouth daily with breakfast. Please give at 7am every morning   HYDROCORTISONE (CORTEF) 5 MG TABLET    Take 1 tablet (5 mg total) by mouth daily at 12 noon.   LAMOTRIGINE (LAMICTAL) 100  MG TABLET    Take 1 tablet (100 mg total) by mouth 2 (two) times daily.   LORATADINE (CLARITIN) 10 MG TABLET    Take 10 mg by mouth daily.   MIDODRINE (PROAMATINE) 10 MG TABLET    Take 1 tablet (10 mg total) by mouth 3 (three) times daily with meals.   PANTOPRAZOLE (PROTONIX) 40 MG TABLET    Take 40 mg by mouth daily.   RISPERIDONE (RISPERDAL) 2 MG TABLET    Take 1 mg by mouth 2 (two) times daily.    SENNA-DOCUSATE (SENOKOT-S) 8.6-50 MG TABLET    Take 1 tablet by mouth at bedtime as needed for mild constipation.  Modified Medications   No medications on file  Discontinued Medications   CLONAZEPAM (KLONOPIN) 0.5 MG TABLET    Take 1 tablet (0.5 mg total) by mouth at bedtime.     SIGNIFICANT DIAGNOSTIC EXAMS  04-30-16: chest x-ray; Severe diffuse bilateral airspace disease. Differential includes multifocal pneumonia versus pulmonary edema.  05-08-16: chest x-ray: NG tube has been removed. Stable right PICC line position. No pneumothorax. Persistent patchy airspace disease  in right upper lobe.   05-08-16: 2-d echo: - Left ventricle: The cavity size was normal. Wall thickness was increased increased in a pattern of mild to moderate LVH. Systolic function was normal. The estimated ejection fraction was in the range of 55% to 60%. Wall motion was normal; there were no regional wall motion abnormalities.  05-09-16: ct angio of chest: No evidence of pulmonary embolism. Diffuse right upper lobe infiltrate with a somewhat nodular appearance. Short-term followup with noncontrast CT following appropriate therapy is recommended. Bilateral small pleural effusions.   05-14-16: swallow study: Recommend initiating a puree/pudding thick diet, no liquids though risk of aspiration of POs and secretions still high. Will follow for tolerance and diet advancement.    LABS REVIEWED:   04-30-16: wbc 6.1; hgb 7.5; hct 23.9; mcv 91.9; plt 143; glucose 91; bun 12; creat 0.89; k+ 4.2; na++ 144; liver normal albumin 2.7; mag 1.8; phos 3.4 ;tsh 3.560; cortisol 12.9; urine and blood culture: no growth 05-03-16; wbc 5.3; hgb 7.6; hct 23.7; mcv 90.1 ;plt 120; glucose 124; bun 16; creat 0.86; k+ 3.3; na++ 143; mag 2.0 phos 2.5 05-07-16: wbc 7.0; hgb 8.6; hct 28.4; mcv 93.7; plt 447; glucose 176; bun 18; creat 0.76; k+ 3.5; na++ 139 05-11-16: wbc 7.7; hgb 8.0; hct 26.0; mcv 93.9; plt 828; glucose 179; bun 12; creat 0.80; k+ 3.6; na++ 141 05-12-16: hgb a1c 6.1 05-17-16: glucose 144; bun 16 ;creat 0.62; k+ 3.5; na++ 142     Review of Systems  Unable to perform ROS: Psychiatric disorder    Physical Exam  Constitutional: No distress.  Eyes: Conjunctivae are normal.  Neck: Neck supple. No JVD present. No thyromegaly present.  Cardiovascular: Normal rate, regular rhythm and intact distal pulses.   Murmur heard. Respiratory: Effort normal and breath sounds normal. No respiratory distress. He has no wheezes.  GI: Soft. Bowel sounds are normal. He exhibits no distension. There is no tenderness.    Musculoskeletal: He exhibits no edema.  Able to move all extremities   Lymphadenopathy:    He has no cervical adenopathy.  Neurological: He is alert.  Skin: Skin is warm and dry. He is not diaphoretic.  Psychiatric: He has a normal mood and affect.     ASSESSMENT/ PLAN:  1. Arterial hypotension: will continue midodrine 10 mg three times daily  2. Epilepsy:  No reports of seizure activity will  continue lamictal 100 mg twice daily tegretol 200 mg three times daily   3. Adrenal insuffiencey: will continue hydrocortisone 10 mg in the AM and 5 mg in the afternoon  4. Gerd: will continue pepcid 20 mg twice daily and protonix 40 mg daily   5. Allergic rhinitis: will continue claritin 10 mg daily   6. Bipolar disorder: will continue risperdal 2 mg twice daily klonopin 0.5 mg nightly for anxiety; cogentin 0.5 mg daily for tardive dyskinsia; and takes lexapro 10 mg daily for depression   7. Constipation will continue colace twice daily and senna daily as needed  8. Aspiration pneumonia: has completed abt; no signs of aspiration present will monitor    Time spent with patient 50    minutes >50% time spent counseling; reviewing medical record; tests; labs; and developing future plan of care    MD is aware of resident's narcotic use and is in agreement with current plan of care. We will attempt to wean resident as apropriate   Ok Edwards NP [facility]  Contact [phone] Monday through Friday 8am- 5pm  After hours call [phone]

## 2016-05-22 LAB — BASIC METABOLIC PANEL
BUN: 9 mg/dL (ref 4–21)
CREATININE: 0.8 mg/dL (ref <=1.3)
Glucose: 93 mg/dL
Potassium: 4.5 mmol/L (ref 3.4–5.3)
Sodium: 142 mmol/L (ref 137–147)

## 2016-05-22 LAB — CBC AND DIFFERENTIAL
HEMATOCRIT: 31 % — AB (ref 41–53)
Hemoglobin: 8.8 g/dL — AB (ref 13.5–17.5)
PLATELETS: 570 10*3/uL — AB (ref 150–399)
WBC: 5.8 10*3/mL

## 2016-05-22 LAB — HEPATIC FUNCTION PANEL
ALK PHOS: 158 U/L — AB (ref 25–125)
ALT: 33 U/L (ref 10–40)
AST: 21 U/L (ref 14–40)
BILIRUBIN, TOTAL: 0.2 mg/dL

## 2016-05-23 ENCOUNTER — Other Ambulatory Visit: Payer: Self-pay | Admitting: *Deleted

## 2016-05-28 ENCOUNTER — Ambulatory Visit (HOSPITAL_COMMUNITY): Admission: RE | Admit: 2016-05-28 | Payer: Medicare Other | Source: Ambulatory Visit

## 2016-05-28 ENCOUNTER — Inpatient Hospital Stay (HOSPITAL_COMMUNITY): Admission: RE | Admit: 2016-05-28 | Payer: Medicare Other | Source: Ambulatory Visit

## 2016-05-28 LAB — FUNGUS CULTURE WITH STAIN

## 2016-05-28 LAB — FUNGUS CULTURE RESULT

## 2016-05-28 LAB — FUNGAL ORGANISM REFLEX

## 2016-06-02 ENCOUNTER — Non-Acute Institutional Stay (SKILLED_NURSING_FACILITY): Payer: Medicare Other | Admitting: Internal Medicine

## 2016-06-02 ENCOUNTER — Encounter: Payer: Self-pay | Admitting: Internal Medicine

## 2016-06-02 DIAGNOSIS — E2740 Unspecified adrenocortical insufficiency: Secondary | ICD-10-CM | POA: Diagnosis not present

## 2016-06-02 DIAGNOSIS — R739 Hyperglycemia, unspecified: Secondary | ICD-10-CM

## 2016-06-02 DIAGNOSIS — G40309 Generalized idiopathic epilepsy and epileptic syndromes, not intractable, without status epilepticus: Secondary | ICD-10-CM | POA: Diagnosis not present

## 2016-06-02 DIAGNOSIS — F339 Major depressive disorder, recurrent, unspecified: Secondary | ICD-10-CM

## 2016-06-02 DIAGNOSIS — T50905A Adverse effect of unspecified drugs, medicaments and biological substances, initial encounter: Secondary | ICD-10-CM

## 2016-06-02 DIAGNOSIS — D508 Other iron deficiency anemias: Secondary | ICD-10-CM | POA: Diagnosis not present

## 2016-06-02 DIAGNOSIS — F411 Generalized anxiety disorder: Secondary | ICD-10-CM | POA: Diagnosis not present

## 2016-06-02 DIAGNOSIS — G8250 Quadriplegia, unspecified: Secondary | ICD-10-CM

## 2016-06-02 NOTE — Progress Notes (Signed)
Patient ID: [name], male   DOB: [birth year], 51 y.o.   MRN: [id number]    HISTORY AND PHYSICAL   DATE: 06/02/2016  Location:    [facility] Room Number: 127 B Place of Service: SNF (31)   Extended Emergency Contact Information Primary Emergency Contact: Servant,Heart Address: [address]          [place], [place] [address] [place] Phone: [phone] Mobile Phone: [phone] Relation: Other Secondary Emergency Contact: Trabert,Kathleen Address: [address]          [place], [place] [address] [place] of [place] Mobile Phone: [phone] Relation: Mother  Advanced Directive information Does Patient Have a Medical Advance Directive?: Yes, Type of Advance Directive: Out of facility DNR (pink MOST or yellow form), Pre-existing out of facility DNR order (yellow form or pink MOST form): Pink MOST form placed in chart (order not valid for inpatient use)  Chief Complaint  Patient presents with  . New Admit To SNF    HPI:  51 yo male seen today as a new admission into SNF following hospital stay for acute respiratory failure with hypoxemia, HCAP, septic shock, right PTX, adrenal insufficiency. CXR revealed diffuse infiltrates. He had hypothermia and was intubated -->self extubated-->reintubated due to increased WOB and right lung atelectasis. CXR showed PTX post intubation and chest tube placed. Labs c/w adrenal insufficiency. IV steroid given. meds adjusted. SSI for elevated CBGs. During hospital stay, he req'd pressor support. Pneumonia tx with IV abx (vanco, meropenem, unasyn, cefepime). He rec'd TF via OGT. 2D echo revealed EF 55-60% and NWMA.  Hgb dropped as low as 6.4-->PRBCs given -->7.3; Plts peaked 834K-->699K; A1c 6.1%; K 3.3-->3.5; albumin 2.2-->2.5; cortisol 12.9-->22.7; Na 142 at d/c. He presents to SNF for short term rehab  Today he reports no concerns. Appetite ok and sleeping well. No nursing concerns. He is a poor historian due  to mental status. Hx obtained from chart. He is currently on hydrocortisone and midodrine  Bipolar d/o/anxiety/depression/mentally challenged - mood/behavior stable on cogentin, klonopin, lexapro, risperdal and lamictal. Followed by psych  Protein calorie malnutrition - albumin 2.5. He gets nutritional supplements per facility protocol. Current weight 173 lbs  sz d/o - stable on lamictal and tegretol. Tegretol level 9.0 in Nov 2017. Na 142  Constipation/PUD/ hx esophageal ulcer- stable on pantoprazole, pepcid, colace and senna  BPH - stable without meds. He was previously on flomax. No urinary c/o  Hx tobacco abuse - quit smoking in 2012  Seasonal allergy - stable on claritin  Spastic quadriparesis/hx closed head injury - stable off meds; followed by neurology ([facility])  Past Medical History:  Diagnosis Date  . Anxiety   . Bipolar disorder ([place])   . BPH (benign prostatic hyperplasia)   . Closed head injury   . Constipation   . Depression   . Esophageal ulcer   . Malnutrition ([place])   . 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 Peptic ulcer disease   . Seizures ([place])   . Smoker     Past Surgical History:  Procedure Laterality Date  . ESOPHAGOGASTRODUODENOSCOPY  06/17/2011   Procedure: ESOPHAGOGASTRODUODENOSCOPY (EGD);  Surgeon: Wonda Horner, MD;  Location: [facility];  Service: Endoscopy;  Laterality: N/A;  . GASTRECTOMY      Patient Care Team: [name], MD as PCP - General (Family Medicine)  Social History   Social History  . Marital  status: Single    Spouse name: N/A  . Number of children: N/A  . Years of education: N/A   Occupational History  . Not on file.   Social History Main Topics  . Smoking status: Former Smoker    Packs/day: 0.50    Quit date: 05/26/2010  . Smokeless tobacco: Former Systems developer    Quit date: 06/08/2001  . Alcohol use No  . Drug use: No  . Sexual activity:  No   Other Topics Concern  . Not on file   Social History Narrative   Patient lives at home alone w/ caretakers.   Patient does not drink caffeine.   Patient is right handed.      reports that he quit smoking about 6 years ago. He smoked 0.50 packs per day. He quit smokeless tobacco use about 14 years ago. He reports that he does not drink alcohol or use drugs.  Family History  Problem Relation Age of Onset  . Family history unknown: Yes   Family Status  Relation Status  . Mother Alive  . Father Alive  . Sister Alive  . Brother Pension scheme manager History  Administered Date(s) Administered  . Influenza Split 06/10/2011  . Influenza Whole 03/26/2006, 03/26/2007, 06/07/2009  . PPD Test 05/25/2016  . Pneumococcal Polysaccharide-23 03/26/2005, 06/10/2011  . Pneumococcal-Unspecified 05/25/2016  . Td 03/26/2006  . Tdap 04/03/2014, 05/25/2016    Allergies  Allergen Reactions  . Pineapple Anaphylaxis    Medications: Patient's Medications  New Prescriptions   No medications on file  Previous Medications   BENZTROPINE (COGENTIN) 0.5 MG TABLET    Take 0.5 mg by mouth daily.   CARBAMAZEPINE (TEGRETOL) 200 MG TABLET    Take 1 tablet (200 mg total) by mouth 3 (three) times daily.   CLONAZEPAM (KLONOPIN) 0.5 MG TABLET    Take 0.5 mg by mouth at bedtime.   DOCUSATE SODIUM (COLACE) 100 MG CAPSULE    Take 1 capsule (100 mg total) by mouth 2 (two) times daily.   ESCITALOPRAM (LEXAPRO) 10 MG TABLET    Take 10 mg by mouth daily.   FAMOTIDINE (PEPCID) 20 MG TABLET    Take 1 tablet (20 mg total) by mouth 2 (two) times daily.   HYDROCORTISONE (CORTEF) 10 MG TABLET    Take 1 tablet (10 mg total) by mouth daily with breakfast. Please give at 7am every morning   HYDROCORTISONE (CORTEF) 5 MG TABLET    Take 1 tablet (5 mg total) by mouth daily at 12 noon.   LAMOTRIGINE (LAMICTAL) 100 MG TABLET    Take 1 tablet (100 mg total) by mouth 2 (two) times daily.   LORATADINE (CLARITIN) 10 MG  TABLET    Take 10 mg by mouth daily.   MIDODRINE (PROAMATINE) 10 MG TABLET    Take 1 tablet (10 mg total) by mouth 3 (three) times daily with meals.   PANTOPRAZOLE (PROTONIX) 40 MG TABLET    Take 40 mg by mouth daily.   RISPERIDONE (RISPERDAL) 2 MG TABLET    Take 1 mg by mouth 2 (two) times daily.    SENNA-DOCUSATE (SENOKOT-S) 8.6-50 MG TABLET    Take 1 tablet by mouth at bedtime as needed for mild constipation.  Modified Medications   No medications on file  Discontinued Medications   No medications on file    Review of Systems  Unable to perform ROS: Psychiatric disorder    Vitals:   06/02/16 1117  BP: 116/67  Pulse: 94  Resp: 20  Temp: 97 F (36.1 C)  TempSrc: Oral  SpO2: 96%  Weight: 173 lb 8 oz (78.7 kg)  Height: '6\' 2"'$  (1.88 m)   Body mass index is 22.28 kg/m.  Physical Exam  Constitutional: He appears well-developed.  Frail appearing sitting in w/c in NAD  HENT:  Mouth/Throat: Oropharynx is clear and moist.  Eyes: Pupils are equal, round, and reactive to light. No scleral icterus.  Neck: Neck supple. Carotid bruit is not present. No thyromegaly present.  Cardiovascular: Normal rate, regular rhythm and intact distal pulses.  Exam reveals no gallop and no friction rub.   Murmur (1/6 SEM) heard. no distal LE swelling. No calf TTP  Pulmonary/Chest: Effort normal and breath sounds normal. He has no wheezes. He has no rales. He exhibits no tenderness.  Abdominal: Soft. Bowel sounds are normal. He exhibits no distension, no abdominal bruit, no pulsatile midline mass and no mass. There is no hepatomegaly. There is no tenderness. There is no rebound and no guarding.  Musculoskeletal: He exhibits edema and deformity.  Lymphadenopathy:    He has no cervical adenopathy.  Neurological: He is alert.  Skin: Skin is warm and dry. No rash noted.  Psychiatric: He has a normal mood and affect. His behavior is normal. Thought content normal.     Labs reviewed: Lab on 05/23/2016    Component Date Value Ref Range Status  . Hemoglobin 05/22/2016 8.8* 13.5 - 17.5 g/dL Final  . HCT 05/22/2016 31* 41 - 53 % Final  . Platelets 05/22/2016 570* 150 - 399 K/L Final  . WBC 05/22/2016 5.8  10^3/mL Final  . Glucose 05/22/2016 93  mg/dL Final  . BUN 05/22/2016 9  4 - 21 mg/dL Final  . Creatinine 05/22/2016 0.8  0.6 - 1.3 mg/dL Final  . Potassium 05/22/2016 4.5  3.4 - 5.3 mmol/L Final  . Sodium 05/22/2016 142  137 - 147 mmol/L Final  . Alkaline Phosphatase 05/22/2016 158* 25 - 125 U/L Final  . ALT 05/22/2016 33  10 - 40 U/L Final  . AST 05/22/2016 21  14 - 40 U/L Final  . Bilirubin, Total 05/22/2016 0.2  mg/dL Final  Admission on 04/30/2016, Discharged on 05/17/2016  No results displayed because visit has over 200 results.    Admission on 04/22/2016, Discharged on 04/25/2016  Component Date Value Ref Range Status  . WBC 04/22/2016 4.6  4.0 - 10.5 K/uL Final  . RBC 04/22/2016 3.27* 4.22 - 5.81 MIL/uL Final  . Hemoglobin 04/22/2016 9.4* 13.0 - 17.0 g/dL Final  . HCT 04/22/2016 29.8* 39.0 - 52.0 % Final  . MCV 04/22/2016 91.1  78.0 - 100.0 fL Final  . MCH 04/22/2016 28.7  26.0 - 34.0 pg Final  . MCHC 04/22/2016 31.5  30.0 - 36.0 g/dL Final  . RDW 04/22/2016 15.8* 11.5 - 15.5 % Final  . Platelets 04/22/2016 143* 150 - 400 K/uL Final  . Neutrophils Relative % 04/22/2016 82  % Final  . Neutro Abs 04/22/2016 3.7  1.7 - 7.7 K/uL Final  . Lymphocytes Relative 04/22/2016 11  % Final  . Lymphs Abs 04/22/2016 0.5* 0.7 - 4.0 K/uL Final  . Monocytes Relative 04/22/2016 6  % Final  . Monocytes Absolute 04/22/2016 0.3  0.1 - 1.0 K/uL Final  . Eosinophils Relative 04/22/2016 1  % Final  . Eosinophils Absolute 04/22/2016 0.0  0.0 - 0.7 K/uL Final  . Basophils Relative 04/22/2016 0  % Final  . Basophils Absolute 04/22/2016 0.0  0.0 -  0.1 K/uL Final  . Sodium 04/22/2016 142  135 - 145 mmol/L Final  . Potassium 04/22/2016 3.7  3.5 - 5.1 mmol/L Final  . Chloride 04/22/2016 104  101 -  111 mmol/L Final  . CO2 04/22/2016 31  22 - 32 mmol/L Final  . Glucose, Bld 04/22/2016 80  65 - 99 mg/dL Final  . BUN 04/22/2016 19  6 - 20 mg/dL Final  . Creatinine, Ser 04/22/2016 0.93  0.61 - 1.24 mg/dL Final  . Calcium 04/22/2016 9.6  8.9 - 10.3 mg/dL Final  . GFR calc non Af Amer 04/22/2016 >60  >60 mL/min Final  . GFR calc Af Amer 04/22/2016 >60  >60 mL/min Final   Comment: (NOTE) The eGFR has been calculated using the CKD EPI equation. This calculation has not been validated in all clinical situations. eGFR's persistently <60 mL/min signify possible Chronic Kidney Disease.   . Anion gap 04/22/2016 7  5 - 15 Final  . Lactic Acid, Venous 04/22/2016 1.24  0.5 - 1.9 mmol/L Final  . Lactic Acid, Venous 04/22/2016 1.18  0.5 - 1.9 mmol/L Final  . Specimen Description 04/28/2016 BLOOD RIGHT ANTECUBITAL   Final  . Special Requests 04/28/2016 IN PEDIATRIC BOTTLE 3 CC   Final  . Culture 04/28/2016    Final                   Value:NO GROWTH 5 DAYS Performed at [facility]   . Report Status 04/28/2016 04/28/2016 FINAL   Final  . Specimen Description 04/28/2016 BLOOD LEFT ARM   Final  . Special Requests 04/28/2016 IN PEDIATRIC BOTTLE 4CC   Final  . Culture 04/28/2016    Final                   Value:NO GROWTH 5 DAYS Performed at [facility]   . Report Status 04/28/2016 04/28/2016 FINAL   Final  . Lactic Acid, Venous 04/23/2016 1.4  0.5 - 1.9 mmol/L Final  . TSH 04/23/2016 2.387  0.350 - 4.500 uIU/mL Final  . Cortisol, Plasma 04/23/2016 20.1  ug/dL Final   Comment: (NOTE) AM    6.7 - 22.6 ug/dL PM   <10.0       ug/dL Performed at [facility]   . ABO/RH(D) 04/23/2016 O POS   Final  . Antibody Screen 04/23/2016 NEG   Final  . Sample Expiration 04/23/2016 04/26/2016   Final  . HIV Screen 4th Generation wRfx 04/24/2016 Non Reactive  Non Reactive Final   Comment: (NOTE) Performed At: [facility] [place], [place] [id number] Lindon Romp MD OV:[id number]   . Strep Pneumo Urinary Antigen 04/23/2016 NEGATIVE  NEGATIVE Final   Comment:        Infection due to S. pneumoniae cannot be absolutely ruled out since the antigen present may be below the detection limit of the test.   . WBC 04/23/2016 4.6  4.0 - 10.5 K/uL Final  . RBC 04/23/2016 2.68* 4.22 - 5.81 MIL/uL Final  . Hemoglobin 04/23/2016 7.6* 13.0 - 17.0 g/dL Final  . HCT 04/23/2016 24.8* 39.0 - 52.0 % Final  . MCV 04/23/2016 92.5  78.0 - 100.0 fL Final  . MCH 04/23/2016 28.4  26.0 - 34.0 pg Final  . MCHC 04/23/2016 30.6  30.0 - 36.0 g/dL Final  . RDW 04/23/2016 16.1* 11.5 - 15.5 % Final  . Platelets 04/23/2016 133* 150 - 400 K/uL Final  . Neutrophils Relative % 04/23/2016 78  %  Final  . Neutro Abs 04/23/2016 3.6  1.7 - 7.7 K/uL Final  . Lymphocytes Relative 04/23/2016 13  % Final  . Lymphs Abs 04/23/2016 0.6* 0.7 - 4.0 K/uL Final  . Monocytes Relative 04/23/2016 8  % Final  . Monocytes Absolute 04/23/2016 0.4  0.1 - 1.0 K/uL Final  . Eosinophils Relative 04/23/2016 1  % Final  . Eosinophils Absolute 04/23/2016 0.0  0.0 - 0.7 K/uL Final  . Basophils Relative 04/23/2016 0  % Final  . Basophils Absolute 04/23/2016 0.0  0.0 - 0.1 K/uL Final  . Sodium 04/23/2016 144  135 - 145 mmol/L Final  . Potassium 04/23/2016 3.7  3.5 - 5.1 mmol/L Final  . Chloride 04/23/2016 112* 101 - 111 mmol/L Final  . CO2 04/23/2016 26  22 - 32 mmol/L Final  . Glucose, Bld 04/23/2016 52* 65 - 99 mg/dL Final  . BUN 04/23/2016 15  6 - 20 mg/dL Final  . Creatinine, Ser 04/23/2016 0.80  0.61 - 1.24 mg/dL Final  . Calcium 04/23/2016 8.3* 8.9 - 10.3 mg/dL Final  . Total Protein 04/23/2016 6.1* 6.5 - 8.1 g/dL Final  . Albumin 04/23/2016 2.7* 3.5 - 5.0 g/dL Final  . AST 04/23/2016 33  15 - 41 U/L Final  . ALT 04/23/2016 46  17 - 63 U/L Final  . Alkaline Phosphatase 04/23/2016 130* 38 - 126 U/L Final  . Total Bilirubin 04/23/2016 0.3  0.3 - 1.2 mg/dL Final  . GFR calc non Af Amer  04/23/2016 >60  >60 mL/min Final  . GFR calc Af Amer 04/23/2016 >60  >60 mL/min Final   Comment: (NOTE) The eGFR has been calculated using the CKD EPI equation. This calculation has not been validated in all clinical situations. eGFR's persistently <60 mL/min signify possible Chronic Kidney Disease.   . Anion gap 04/23/2016 6  5 - 15 Final  . Procalcitonin 04/23/2016 <0.10  ng/mL Final   Comment:        Interpretation: PCT (Procalcitonin) <= 0.5 ng/mL: Systemic infection (sepsis) is not likely. Local bacterial infection is possible. (NOTE)         ICU PCT Algorithm               Non ICU PCT Algorithm    ----------------------------     ------------------------------         PCT < 0.25 ng/mL                 PCT < 0.1 ng/mL     Stopping of antibiotics            Stopping of antibiotics       strongly encouraged.               strongly encouraged.    ----------------------------     ------------------------------       PCT level decrease by               PCT < 0.25 ng/mL       >= 80% from peak PCT       OR PCT 0.25 - 0.5 ng/mL          Stopping of antibiotics                                             encouraged.     Stopping of antibiotics  encouraged.    ----------------------------     ------------------------------       PCT level decrease by              PCT >= 0.25 ng/mL       < 80% from peak PCT        AND PCT >= 0.5 ng/mL            Continuin                          g antibiotics                                              encouraged.       Continuing antibiotics            encouraged.    ----------------------------     ------------------------------     PCT level increase compared          PCT > 0.5 ng/mL         with peak PCT AND          PCT >= 0.5 ng/mL             Escalation of antibiotics                                          strongly encouraged.      Escalation of antibiotics        strongly encouraged.   . L. pneumophila Serogp 1 Ur Ag  04/24/2016 Negative  Negative Final   Comment: (NOTE) Presumptive negative for L. pneumophila serogroup 1 antigen in urine, suggesting no recent or current infection. Legionnaires' disease cannot be ruled out since other serogroups and species may also cause disease. Performed At: [facility] [address] [place], [place] [id number] Lindon Romp MD [id number]   . Source of Sample 04/24/2016 URINE, CLEAN CATCH   Final  . Influenza A By PCR 04/23/2016 NEGATIVE  NEGATIVE Final  . Influenza B By PCR 04/23/2016 NEGATIVE  NEGATIVE Final   Comment: (NOTE) The Xpert Xpress Flu assay is intended as an aid in the diagnosis of  influenza and should not be used as a sole basis for treatment.  This  assay is FDA approved for nasopharyngeal swab specimens only. Nasal  washings and aspirates are unacceptable for Xpert Xpress Flu testing.   Marland Kitchen MRSA by PCR 04/23/2016 NEGATIVE  NEGATIVE Final   Comment:        The GeneXpert MRSA Assay (FDA approved for NASAL specimens only), is one component of a comprehensive MRSA colonization surveillance program. It is not intended to diagnose MRSA infection nor to guide or monitor treatment for MRSA infections.   . Troponin I 04/23/2016 <0.03  <0.03 ng/mL Final  . Carbamazepine Lvl 04/23/2016 9.0  4.0 - 12.0 ug/mL Final  . Glucose-Capillary 04/23/2016 64* 65 - 99 mg/dL Final  . Comment 1 04/23/2016 Notify RN   Final  . Comment 2 04/23/2016 Document in Chart   Final  . Iron 04/23/2016 21* 45 - 182 ug/dL Final  . TIBC 04/23/2016 277  250 - 450 ug/dL Final  . Saturation Ratios 04/23/2016 8* 17.9 - 39.5 % Final  . UIBC 04/23/2016 256  ug/dL Final  . Ferritin 04/23/2016 33  24 - 336 ng/mL Final  . Vitamin B-12 04/23/2016 716  180 - 914 pg/mL Final   Comment: (NOTE) This assay is not validated for testing neonatal or myeloproliferative syndrome specimens for Vitamin B12 levels. Performed at [facility]   . Folate, Hemolysate  04/24/2016 443.5  Not Estab. ng/mL Final  . Hematocrit 04/24/2016 22.4* 37.5 - 51.0 % Final  . Folate, RBC 04/24/2016 1980  >498 ng/mL Final   Comment: (NOTE) Performed At: [facility] [place], [place] [id number] Lindon Romp MD [id number]   . Glucose-Capillary 04/23/2016 68  65 - 99 mg/dL Final  . Comment 1 04/23/2016 Notify RN   Final  . Comment 2 04/23/2016 Document in Chart   Final  . Glucose-Capillary 04/23/2016 69  65 - 99 mg/dL Final  . Comment 1 04/23/2016 Notify RN   Final  . WBC 04/24/2016 3.7* 4.0 - 10.5 K/uL Final  . RBC 04/24/2016 2.62* 4.22 - 5.81 MIL/uL Final  . Hemoglobin 04/24/2016 7.4* 13.0 - 17.0 g/dL Final  . HCT 04/24/2016 23.8* 39.0 - 52.0 % Final  . MCV 04/24/2016 90.8  78.0 - 100.0 fL Final  . MCH 04/24/2016 28.2  26.0 - 34.0 pg Final  . MCHC 04/24/2016 31.1  30.0 - 36.0 g/dL Final  . RDW 04/24/2016 16.6* 11.5 - 15.5 % Final  . Platelets 04/24/2016 101* 150 - 400 K/uL Final  . Glucose-Capillary 04/24/2016 64* 65 - 99 mg/dL Final  . Glucose-Capillary 04/24/2016 84  65 - 99 mg/dL Final  . Glucose-Capillary 04/24/2016 72  65 - 99 mg/dL Final  . Glucose-Capillary 04/24/2016 92  65 - 99 mg/dL Final  . Glucose-Capillary 04/25/2016 61* 65 - 99 mg/dL Final    Dg Chest 1 View  Result Date: 05/03/2016 CLINICAL DATA:  Recent RIGHT lower lobe pneumonia, concern for possible aspiration during orogastric tube feeding, former smoker EXAM: CHEST 1 VIEW COMPARISON:  Portable exam 1208 hours compared to 0450 hours FINDINGS: Tip of endotracheal tube projects 3.8 cm above carina. Nasogastric tube extends into stomach. RIGHT arm PICC line tip projects over RIGHT atrium though this may be accentuated by positioning, rotation and kyphosis. Enlargement of cardiac silhouette. BILATERAL pulmonary infiltrates RIGHT greater than LEFT. Probable small RIGHT pleural effusion. No definite pneumothorax. Bones demineralized with old healed RIGHT humeral diaphyseal  fracture deformity. IMPRESSION: Persistent BILATERAL pulmonary infiltrates RIGHT greater than LEFT with suspected small RIGHT pleural effusion. No significant interval change since earlier study. Electronically Signed   By: Lavonia Dana M.D.   On: 05/03/2016 12:35   Dg Abd 1 View  Result Date: 05/12/2016 CLINICAL DATA:  Feeding tube placement EXAM: ABDOMEN - 1 VIEW COMPARISON:  May 09, 2016 FINDINGS: Feeding tube tip is in the distal stomach near the pylorus. Bowel gas pattern is normal. Lung bases clear. No bowel obstruction or free air evident. IMPRESSION: Feeding tube tip in distal stomach near the pylorus. Bowel gas pattern unremarkable. Electronically Signed   By: Lowella Grip III M.D.   On: 05/12/2016 11:28   Dg Abd 1 View  Result Date: 05/09/2016 CLINICAL DATA:  NG tube placement. EXAM: ABDOMEN - 1 VIEW COMPARISON:  None. FINDINGS: The bowel gas pattern is normal. Residual oral contrast throughout the abdomen. Feeding catheter is seen looping on itself in the expected location of gastric body/gastric cardia. IMPRESSION: Enteric catheter loops on itself with tip in the expected location of the gastric cardia. Nonobstructive bowel gas pattern.  Electronically Signed   By: Fidela Salisbury M.D.   On: 05/09/2016 22:53   Dg Abd 1 View  Result Date: 05/08/2016 CLINICAL DATA:  NG tube placement EXAM: ABDOMEN - 1 VIEW COMPARISON:  05/03/2016 FINDINGS: Linear atelectasis at the right base. Decreased right pleural effusion. Esophageal tube tip overlies the distal stomach. IMPRESSION: Tip of the esophageal tube overlies the distal stomach Electronically Signed   By: Donavan Foil M.D.   On: 05/08/2016 23:20   Dg Abd 1 View  Result Date: 05/03/2016 CLINICAL DATA:  NG tube placement. EXAM: ABDOMEN - 1 VIEW COMPARISON:  04/30/2016 FINDINGS: Tip and side port of the enteric tube below the diaphragm in the stomach. Previous prominent bowel loop in the central abdomen is no longer seen. No  evidence of obstruction or free air. A right pleural effusion is incidentally noted. Scoliotic curvature of the spine. IMPRESSION: Tip and side port of the enteric tube below the diaphragm in the stomach. Electronically Signed   By: Jeb Levering M.D.   On: 05/03/2016 21:44   Ct Angio Chest Pe W Or Wo Contrast  Result Date: 05/09/2016 CLINICAL DATA:  Shortness of Breath EXAM: CT ANGIOGRAPHY CHEST WITH CONTRAST TECHNIQUE: Multidetector CT imaging of the chest was performed using the standard protocol during bolus administration of intravenous contrast. Multiplanar CT image reconstructions and MIPs were obtained to evaluate the vascular anatomy. CONTRAST:  100 mL Omnipaque 300 COMPARISON:  None. FINDINGS: Cardiovascular: Thoracic aorta demonstrates mild calcification without aneurysmal dilatation. Pulmonary artery is well visualized and demonstrates a normal branching pattern. No filling defects to suggest pulmonary embolism are seen. A small air bubble is noted within the main pulmonary artery just above the pulmonic valve likely related to IV administration. Cardiac structures are mildly prominent. Mediastinum/Nodes: Thoracic inlet is within normal limits. No hilar or mediastinal lymphadenopathy is identified. Right PICC line is noted in satisfactory position. Lungs/Pleura: Small left pleural effusion is identified. No focal infiltrate is noted on the left. Small right pleural effusion is noted as well. Diffuse infiltrate is noted in the right upper lobe. Some slight nodularity is identified. Upper Abdomen: Feeding catheter is noted extending into the stomach. No acute abnormality in the upper abdomen is seen. Musculoskeletal: Within normal limits. Review of the MIP images confirms the above findings. IMPRESSION: No evidence of pulmonary embolism. Diffuse right upper lobe infiltrate with a somewhat nodular appearance. Short-term followup with noncontrast CT following appropriate therapy is recommended.  Bilateral small pleural effusions. Electronically Signed   By: Inez Catalina M.D.   On: 05/09/2016 16:36   Dg Chest Port 1 View  Result Date: 05/12/2016 CLINICAL DATA:  51 year old male with acute respiratory favor and 2 episodes of emesis. Possible aspiration. EXAM: PORTABLE CHEST 1 VIEW COMPARISON:  Chest radiograph dated 05/12/2016 FINDINGS: The patient is rotated to the left. Right-sided PICC in stable position with tip projecting over the right atrium. There has been interval displacement of the enteric tube. The tube exchange the the distal esophagus and folds back and extends up into the oropharynx and folds again and extends into the upper esophagus. The radiopaque weighted tip of the tube is in the upper esophagus at the level above the aortic arch. Recommend removal of the tube and repositioning. Right upper lobe as well as right lung base densities again noted. Trace right pleural effusion. No pneumothorax. The cardiac silhouette is within normal limits. There is scoliosis. No acute fracture. IMPRESSION: Interval dislodging of the enteric tube extending up into the oropharynx  and back down into the upper esophagus. Recommend removal of the enteric tube and repositioning. Right upper lobe and right lung base airspace infiltrate and small right pleural effusion. These results were called by telephone at the time of interpretation on 05/12/2016 at 11:47 pm to Dr. Concepcion Living who verbally acknowledged these results. Electronically Signed   By: Anner Crete M.D.   On: 05/12/2016 23:49   Dg Chest Port 1 View  Result Date: 05/12/2016 CLINICAL DATA:  Respiratory failure. EXAM: PORTABLE CHEST 1 VIEW COMPARISON:  05/11/2016. FINDINGS: Patient rotated to the right. Right PICC line and the feeding tube in stable position. Cardiomegaly. Progressive right upper lobe infiltrate. Right base atelectasis. Small right pleural effusion. No pneumothorax. IMPRESSION: 1.  Right PICC line and feeding tube in stable  position. 2. Progressive right upper lobe infiltrate.  Right base atelectasis. Electronically Signed   By: Marcello Moores  Register   On: 05/12/2016 07:14   Dg Chest Port 1 View  Result Date: 05/11/2016 CLINICAL DATA:  Respiratory failure. EXAM: PORTABLE CHEST 1 VIEW COMPARISON:  05/10/2016 FINDINGS: Right PICC line remains in place, unchanged. Increasing patchy airspace disease throughout the right lung. Low lung volumes. No confluent opacity on the left. Heart is borderline in size. IMPRESSION: Increasing patchy airspace disease throughout the right lung. Low lung volumes. Electronically Signed   By: Rolm Baptise M.D.   On: 05/11/2016 07:19   Dg Chest Port 1 View  Result Date: 05/10/2016 CLINICAL DATA:  51 y/o  M; acute on chronic respiratory failure. EXAM: PORTABLE CHEST 1 VIEW COMPARISON:  05/09/2016 chest radiograph and chest CT. FINDINGS: Ill-defined opacity in the right lung apex is better characterized on prior CT. Stable cardiac silhouette. Enteric tube tip below the field of view in the abdomen. Right PICC line tip projects over the cavoatrial junction. No new focal airspace opacity. Bones are unremarkable. IMPRESSION: Stable ill-defined consolidation in the right upper lobe. No new focal airspace opacity. Electronically Signed   By: Kristine Garbe M.D.   On: 05/10/2016 01:23   Dg Chest Port 1 View  Result Date: 05/09/2016 CLINICAL DATA:  Respiratory failure EXAM: PORTABLE CHEST 1 VIEW COMPARISON:  May 08, 2016 FINDINGS: Central catheter tip is at the cavoatrial junction. Feeding tube tip is below the diaphragm. No pneumothorax. There is a nondisplaced recently appearing fracture of the right posterior third rib. There old healed rib fractures on the left. There is patchy airspace consolidation in the left lower lobe. There is mild right base atelectasis. Lungs elsewhere are clear. Heart is upper normal in size with pulmonary vascularity within normal limits. No adenopathy.  IMPRESSION: Patchy airspace disease left lower lobe, concerning for focal pneumonia. Mild right base atelectasis. Stable cardiac silhouette. Tube and catheter positions as described. No pneumothorax. Recent appearing fracture posterior right third rib. Electronically Signed   By: Lowella Grip III M.D.   On: 05/09/2016 07:06   Dg Chest Port 1 View  Result Date: 05/08/2016 CLINICAL DATA:  Follow-up pneumothorax EXAM: PORTABLE CHEST 1 VIEW COMPARISON:  05/08/2016 FINDINGS: Cardiomediastinal silhouette is stable. NG tube has been removed right arm PICC line is unchanged in position. No pneumothorax. Persistent patchy airspace disease in right upper lung. No pulmonary edema. IMPRESSION: NG tube has been removed. Stable right PICC line position. No pneumothorax. Persistent patchy airspace disease in right upper lobe. Electronically Signed   By: Lahoma Crocker M.D.   On: 05/08/2016 16:29   Dg Chest Port 1 View  Result Date: 05/08/2016 CLINICAL DATA:  51 year old  male with acute respiratory failure and hypoxia. EXAM: PORTABLE CHEST 1 VIEW COMPARISON:  Chest radiograph dated 05/07/2016 FINDINGS: There has been interval retraction of the endotracheal tube. Right-sided PICC, enteric tube, and right chest tube are in stable positioning. Left lung base density appears grossly similar or slightly improved since the prior radiograph. Residual density noted in the right upper lobe similar to prior radiograph. There is no pleural effusion or pneumothorax. Stable cardiac silhouette. No acute osseous pathology. IMPRESSION: Interval removal of the endotracheal tube with slight interval improvement of the aeration of the left lung base. Persistent residual airspace densities at the left lung base and right upper lobe. Follow-up to resolution recommended. Electronically Signed   By: Anner Crete M.D.   On: 05/08/2016 06:00   Dg Chest Port 1 View  Result Date: 05/07/2016 CLINICAL DATA:  Respiratory failure. EXAM:  PORTABLE CHEST 1 VIEW COMPARISON:  05/06/2016. FINDINGS: Endotracheal tube, NG tube, right PICC line stable position. Right chest tube in stable position. No pneumothorax. Cardiomegaly. Left lower lobe infiltrate atelectasis. Interim improvement of diffuse right lung infiltrate with mild residual in the right upper lobe. Small left pleural effusion. IMPRESSION: 1. Lines and tubes including right chest tube in stable position. No pneumothorax. 2.  Left lower lobe atelectasis and consolidation. 3. Interim improvement of diffuse right lung infiltrate with mild residual in the right upper lobe. Electronically Signed   By: Marcello Moores  Register   On: 05/07/2016 06:41   Dg Chest Port 1 View  Result Date: 05/06/2016 CLINICAL DATA:  Acute respiratory failure with hypoxemia. EXAM: PORTABLE CHEST 1 VIEW COMPARISON:  05/04/2016 FINDINGS: Endotracheal tube is 3.6 cm above the carina and appropriately positioned. Right arm PICC line tip near the superior cavoatrial junction. Nasogastric tube extends into the abdomen. Improved aeration in the left lung and right lung base. Persistent airspace densities in the right upper lung. There are some densities at the left lung base. Heart size is within normal limits and stable. Stable position of the right chest pigtail drain. There may be a tiny right apical pneumothorax. IMPRESSION: Improved aeration in both lungs with residual airspace disease in the right upper lung. Stable position of the right chest tube. Question a tiny right apical pneumothorax. Endotracheal tube is appropriately positioned. Electronically Signed   By: Markus Daft M.D.   On: 05/06/2016 07:48   Dg Chest Port 1 View  Result Date: 05/04/2016 CLINICAL DATA:  RIGHT chest tube insertion EXAM: PORTABLE CHEST 1 VIEW COMPARISON:  Portable exam 1607 hours compared to 1419 hours FINDINGS: Interval placement of a pigtail RIGHT thoracostomy tube extending to apex. Interval marked reduction of RIGHT pneumothorax post  thoracostomy tube insertion. Tip of endotracheal tube projects 2.2 cm above carina. Nasogastric tube extends into stomach. RIGHT arm PICC line tip projects over cavoatrial junction. Borderline enlargement of cardiac silhouette. Extensive BILATERAL pulmonary infiltrates again identified. Small RIGHT pleural effusion. No pneumothorax. Bones demineralized with multiple old LEFT rib fractures. IMPRESSION: Near complete re-expansion of RIGHT lung post thoracostomy tube insertion. Persistent diffuse BILATERAL pulmonary infiltrates and small RIGHT pleural effusion, could reflect edema or infection. Electronically Signed   By: Lavonia Dana M.D.   On: 05/04/2016 16:36   Dg Chest Port 1 View  Result Date: 05/04/2016 CLINICAL DATA:  Endotracheal tube.  Coughing up a lot of mucus. EXAM: PORTABLE CHEST 1 VIEW COMPARISON:  05/04/2016 at 10:54 a.m. FINDINGS: Endotracheal tube has been placed with tip 3.2 cm above the carina. Nasogastric tube the is coiled over the  stomach with tip not visualized. Right-sided PICC line has tip at the cavoatrial junction. Patient is rotated to the left. There has been interval development of a large right-sided pneumothorax of approximately 50%. Moderate airspace opacification within the collapsed lung. Suggestion of a right-sided hydropneumothorax. Worsening opacification over the left perihilar region and medial left base likely edema with basilar atelectasis/effusion. Remainder the exam is unchanged. IMPRESSION: Interval development of large right-sided pneumothorax likely with component of hydropneumothorax. Worsening opacification of the left perihilar region suggesting edema as well as opacification of the medial left base likely basilar atelectasis/effusion. Tubes and lines as described. Endotracheal tube with tip 3 cm above the carina. Critical Value/emergent results were called by telephone at the time of interpretation on 05/04/2016 at 3:12 pm to patient's nurse, Jacklynn Lewis, who  verbally acknowledged these results and will notify physician in charge. Electronically Signed   By: Marin Olp M.D.   On: 05/04/2016 15:12   Dg Chest Port 1 View  Result Date: 05/04/2016 CLINICAL DATA:  Hypoxia with weakness and altered mental status. EXAM: PORTABLE CHEST 1 VIEW COMPARISON:  05/03/2016 FINDINGS: Patient is moderately rotated to the right. Nasogastric tube is looped over the stomach as tip is not visualized. Right-sided PICC line unchanged with tip over the right atrium. Near complete opacification of the right lung with interval worsening likely due to combination of collapse and effusion as there is cut off of the right mainstem bronchus air column. Cannot exclude underlying mass or infection. Likely mild cardiomediastinal shift to the right. The mild prominence of the left perihilar markings unchanged. Remainder of the exam is unchanged. IMPRESSION: Interval worsening of near complete opacification of the right lung with minimal the cardiomediastinal shift to the right and cut off of the right mainstem bronchus as findings are likely due to lobar collapse and effusion. Tubes and lines unchanged. Electronically Signed   By: Marin Olp M.D.   On: 05/04/2016 11:38   Dg Swallowing Func-speech Pathology  Result Date: 05/14/2016 Late Entry: Test completed on 05/09/16; Report completed 05/14/16 Objective Swallowing Evaluation: Type of Study: MBS-Modified Barium Swallow Study Patient Details Name: ARAGORN RECKER MRN: [id number] Date of Birth: 08-Apr-1966 Today's Date: 05/09/2016 Time: SLP Start Time (ACUTE ONLY): 1240-SLP Stop Time (ACUTE ONLY): 1255 SLP Time Calculation (min) (ACUTE ONLY): 15 min Past Medical History: Past Medical History: Diagnosis Date . Anxiety  . Bipolar disorder ([place])  . BPH (benign prostatic hyperplasia)  . Closed head injury  . Constipation  . Depression  . Esophageal ulcer  . Malnutrition ([place])  . Mental retardation, mild (I.Q. 50-70)  . 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 Peptic ulcer disease  . Seizures ([place])  . Smoker  Past Surgical History: Past Surgical History: Procedure Laterality Date . ESOPHAGOGASTRODUODENOSCOPY  06/17/2011  Procedure: ESOPHAGOGASTRODUODENOSCOPY (EGD);  Surgeon: Wonda Horner, MD;  Location: [facility];  Service: Endoscopy;  Laterality: N/A; . GASTRECTOMY   HPI: 51 y/o M with PMH of MR (baseline lives at group home, IQ ~ 50-70) and prior tracheostomy, with recent admit for RLL PNA. Patient treated and discharged on levaquin. Found 12/6 altered, hypothermic, anemic with diffuse bilateral infiltrates. Required intubation in ER. Self extubated 12/9, re-intubated until 12/13. During prior admission pt seen by SLP, resumed diet of Dys 3/nectar thick liquids that pt consumes at group home with full supervision due to impulsivity.  No Data Recorded Assessment / Plan / Recommendation CHL IP CLINICAL IMPRESSIONS 05/09/2016 Therapy Diagnosis Severe pharyngeal  phase dysphagia;Moderate oral phase dysphagia Clinical Impression Pt demonstrates a moderate to severe oropharyngeal dysphagia. Oral phase is characterized by forward leaning posture with some anterior spillage, particularly of secretions. Pt transits bolus with lingual pumping with 50% of bolus remaining, that pt does transit for a second swallow independently. Liquids arrive and the pyriforms prior to swallow initiation, leading to aspiration before and during the swallow with all liquid textures, even with a chin tuck. Sensation is late and cough is ineffective to clear. Pt tolerated purees in this study, though aspiration before the swallow is still possible due to severity of delay. Recommend initiating a puree/pudding thick diet, no liquids though risk of aspiration of POs and secretions still high. Will follow for tolerance and diet advancement. Impact on safety and function Severe aspiration risk   CHL IP TREATMENT RECOMMENDATION 05/09/2016 Treatment  Recommendations Therapy as outlined in treatment plan below   Prognosis 05/09/2016 Prognosis for Safe Diet Advancement Fair Barriers to Reach Goals -- Barriers/Prognosis Comment -- CHL IP DIET RECOMMENDATION 05/14/2016 SLP Diet Recommendations -- Liquid Administration via -- Medication Administration -- Compensations Slow rate;Small sips/bites;Minimize environmental distractions Postural Changes --   CHL IP OTHER RECOMMENDATIONS 05/09/2016 Recommended Consults -- Oral Care Recommendations Oral care BID Other Recommendations Order thickener from pharmacy;Have oral suction available   CHL IP FOLLOW UP RECOMMENDATIONS 05/14/2016 Follow up Recommendations Home health SLP   CHL IP FREQUENCY AND DURATION 05/09/2016 Speech Therapy Frequency (ACUTE ONLY) min 2x/week Treatment Duration 2 weeks      CHL IP ORAL PHASE 05/09/2016 Oral Phase Impaired Oral - Pudding Teaspoon -- Oral - Pudding Cup -- Oral - Honey Teaspoon Delayed oral transit;Decreased bolus cohesion;Lingual pumping;Piecemeal swallowing Oral - Honey Cup -- Oral - Nectar Teaspoon Delayed oral transit;Decreased bolus cohesion;Lingual pumping;Piecemeal swallowing Oral - Nectar Cup -- Oral - Nectar Straw -- Oral - Thin Teaspoon -- Oral - Thin Cup -- Oral - Thin Straw -- Oral - Puree Delayed oral transit;Decreased bolus cohesion;Lingual pumping;Piecemeal swallowing Oral - Mech Soft -- Oral - Regular -- Oral - Multi-Consistency -- Oral - Pill -- Oral Phase - Comment --  CHL IP PHARYNGEAL PHASE 05/09/2016 Pharyngeal Phase Impaired Pharyngeal- Pudding Teaspoon -- Pharyngeal -- Pharyngeal- Pudding Cup -- Pharyngeal -- Pharyngeal- Honey Teaspoon Delayed swallow initiation-pyriform sinuses;Penetration/Aspiration during swallow Pharyngeal Material enters airway, passes BELOW cords and not ejected out despite cough attempt by patient;Material enters airway, passes BELOW cords without attempt by patient to eject out (silent aspiration) Pharyngeal- Honey Cup -- Pharyngeal --  Pharyngeal- Nectar Teaspoon Delayed swallow initiation-pyriform sinuses;Penetration/Aspiration before swallow;Moderate aspiration Pharyngeal Material enters airway, passes BELOW cords and not ejected out despite cough attempt by patient Pharyngeal- Nectar Cup -- Pharyngeal -- Pharyngeal- Nectar Straw -- Pharyngeal -- Pharyngeal- Thin Teaspoon -- Pharyngeal -- Pharyngeal- Thin Cup -- Pharyngeal -- Pharyngeal- Thin Straw -- Pharyngeal -- Pharyngeal- Puree Delayed swallow initiation-pyriform sinuses Pharyngeal -- Pharyngeal- Mechanical Soft -- Pharyngeal -- Pharyngeal- Regular -- Pharyngeal -- Pharyngeal- Multi-consistency -- Pharyngeal -- Pharyngeal- Pill -- Pharyngeal -- Pharyngeal Comment --  No flowsheet data found. No flowsheet data found. Herbie Baltimore, MA CCC-SLP [phone] Lynann Beaver 05/14/2016, 2:41 PM                Assessment/Plan   ICD-9-CM ICD-10-CM   1. Adrenal insufficiency (HCC) 255.41 E27.40   2. 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 Recurrent major depressive disorder, remission status unspecified ([place]) 296.30 F33.9   5. Anxiety state  300.00 F41.1   6. Other iron deficiency anemia 280.8 D50.8   7. Hyperglycemia, drug-induced - A1c 6.1% 790.29 R73.9    E947.9 T50.905A     Check BMP, CBC  Refer to Endo for adrenal insufficiency  Cont current meds as ordered  Follow CBGs - may need insulin tx if >200s  PT/OT/ST as ordered  Nutritional supplements as ordered  GOAL: short term rehab with potential for long term care. Communicated with pt and nursing.  Will follow  Kaylor Maiers S. Perlie Gold  [facility] [address] [place], [place] [address] [phone] Cell (Monday-Friday 8 AM - 5 PM) [phone] After 5 PM and follow prompts

## 2016-06-05 ENCOUNTER — Ambulatory Visit (HOSPITAL_COMMUNITY)
Admission: RE | Admit: 2016-06-05 | Discharge: 2016-06-05 | Disposition: A | Discharge status: Home / Self Care | Payer: No Typology Code available for payment source | Source: Ambulatory Visit | Attending: Internal Medicine | Admitting: Internal Medicine

## 2016-06-05 DIAGNOSIS — F70 Mild intellectual disabilities: Secondary | ICD-10-CM | POA: Insufficient documentation

## 2016-06-05 DIAGNOSIS — G40909 Epilepsy, unspecified, not intractable, without status epilepticus: Secondary | ICD-10-CM | POA: Diagnosis not present

## 2016-06-05 DIAGNOSIS — R1319 Other dysphagia: Secondary | ICD-10-CM | POA: Diagnosis not present

## 2016-06-05 DIAGNOSIS — R1310 Dysphagia, unspecified: Secondary | ICD-10-CM | POA: Insufficient documentation

## 2016-06-05 DIAGNOSIS — R569 Unspecified convulsions: Secondary | ICD-10-CM | POA: Insufficient documentation

## 2016-06-05 DIAGNOSIS — F319 Bipolar disorder, unspecified: Secondary | ICD-10-CM | POA: Insufficient documentation

## 2016-06-05 DIAGNOSIS — F329 Major depressive disorder, single episode, unspecified: Secondary | ICD-10-CM | POA: Insufficient documentation

## 2016-06-05 DIAGNOSIS — N400 Enlarged prostate without lower urinary tract symptoms: Secondary | ICD-10-CM | POA: Insufficient documentation

## 2016-06-05 DIAGNOSIS — K279 Peptic ulcer, site unspecified, unspecified as acute or chronic, without hemorrhage or perforation: Secondary | ICD-10-CM | POA: Insufficient documentation

## 2016-06-05 DIAGNOSIS — F419 Anxiety disorder, unspecified: Secondary | ICD-10-CM | POA: Insufficient documentation

## 2016-06-05 DIAGNOSIS — K5900 Constipation, unspecified: Secondary | ICD-10-CM | POA: Insufficient documentation

## 2016-06-05 DIAGNOSIS — F17200 Nicotine dependence, unspecified, uncomplicated: Secondary | ICD-10-CM | POA: Diagnosis not present

## 2016-06-10 ENCOUNTER — Ambulatory Visit: Payer: Medicare Other | Admitting: Neurology

## 2016-06-13 DIAGNOSIS — F319 Bipolar disorder, unspecified: Secondary | ICD-10-CM | POA: Insufficient documentation

## 2016-06-13 DIAGNOSIS — J309 Allergic rhinitis, unspecified: Secondary | ICD-10-CM | POA: Insufficient documentation

## 2016-06-13 DIAGNOSIS — E271 Primary adrenocortical insufficiency: Secondary | ICD-10-CM | POA: Insufficient documentation

## 2016-06-13 DIAGNOSIS — K5901 Slow transit constipation: Secondary | ICD-10-CM | POA: Insufficient documentation

## 2016-07-02 ENCOUNTER — Encounter: Payer: Self-pay | Admitting: Adult Health

## 2016-07-02 ENCOUNTER — Non-Acute Institutional Stay (SKILLED_NURSING_FACILITY): Payer: Medicare Other | Admitting: Adult Health

## 2016-07-02 DIAGNOSIS — J939 Pneumothorax, unspecified: Secondary | ICD-10-CM | POA: Diagnosis not present

## 2016-07-02 DIAGNOSIS — G800 Spastic quadriplegic cerebral palsy: Secondary | ICD-10-CM

## 2016-07-02 DIAGNOSIS — G40309 Generalized idiopathic epilepsy and epileptic syndromes, not intractable, without status epilepticus: Secondary | ICD-10-CM

## 2016-07-02 DIAGNOSIS — I959 Hypotension, unspecified: Secondary | ICD-10-CM

## 2016-07-02 DIAGNOSIS — G8250 Quadriplegia, unspecified: Secondary | ICD-10-CM | POA: Diagnosis not present

## 2016-07-02 NOTE — Progress Notes (Signed)
Location:  [facility] Room Number: [place] of Service:  SNF (31)   CODE STATUS: Full Code  Allergies  Allergen Reactions  . Pineapple Anaphylaxis    Chief Complaint  Patient presents with  . Discharge Note    HPI:  He is being discharged back to home with home health for pt/ot. He will need a high back wheelchair. He will need his prescriptions to be written and will need to follow up with his medical provider.  He had been hospitalized for pneumonia and new diagnosis of adrenal insuffiencey   Past Medical History:  Diagnosis Date  . Anxiety   . Bipolar disorder ([place])   . BPH (benign prostatic hyperplasia)   . Closed head injury   . Constipation   . Depression   . Esophageal ulcer   . Malnutrition ([place])   . 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 Peptic ulcer disease   . Seizures ([place])   . Smoker     Past Surgical History:  Procedure Laterality Date  . ESOPHAGOGASTRODUODENOSCOPY  06/17/2011   Procedure: ESOPHAGOGASTRODUODENOSCOPY (EGD);  Surgeon: Wonda Horner, MD;  Location: [facility];  Service: Endoscopy;  Laterality: N/A;  . GASTRECTOMY      Social History   Social History  . Marital status: Single    Spouse name: N/A  . Number of children: N/A  . Years of education: N/A   Occupational History  . Not on file.   Social History Main Topics  . Smoking status: Former Smoker    Packs/day: 0.50    Quit date: 05/26/2010  . Smokeless tobacco: Former Systems developer    Quit date: 06/08/2001  . Alcohol use No  . Drug use: No  . Sexual activity: No   Other Topics Concern  . Not on file   Social History Narrative   Patient lives at home alone w/ caretakers.   Patient does not drink caffeine.   Patient is right handed.    Family History  Problem Relation Age of Onset  . Family history unknown: Yes    VITAL SIGNS BP 132/84   Pulse 76   Temp 98.6 F (37 C)    Resp 18   Ht 6\' 2"  (1.88 m)   Wt 173 lb 8 oz (78.7 kg)   SpO2 97%   BMI 22.28 kg/m   Patient's Medications  New Prescriptions   No medications on file  Previous Medications   BENZTROPINE (COGENTIN) 0.5 MG TABLET    Take 0.5 mg by mouth daily.   CARBAMAZEPINE (TEGRETOL) 200 MG TABLET    Take 1 tablet (200 mg total) by mouth 3 (three) times daily.   CLONAZEPAM (KLONOPIN) 0.5 MG TABLET    Take 0.5 mg by mouth at bedtime.   DOCUSATE SODIUM (COLACE) 100 MG CAPSULE    Take 1 capsule (100 mg total) by mouth 2 (two) times daily.   ESCITALOPRAM (LEXAPRO) 10 MG TABLET    Take 10 mg by mouth daily.   FAMOTIDINE (PEPCID) 20 MG TABLET    Take 1 tablet (20 mg total) by mouth 2 (two) times daily.   HYDROCORTISONE (CORTEF) 10 MG TABLET    Take 1 tablet (10 mg total) by mouth daily with breakfast. Please give at 7am every morning   HYDROCORTISONE (CORTEF) 5 MG TABLET    Take 1 tablet (5 mg total) by mouth daily at 12 noon.   LAMOTRIGINE (  LAMICTAL) 100 MG TABLET    Take 1 tablet (100 mg total) by mouth 2 (two) times daily.   LORATADINE (CLARITIN) 10 MG TABLET    Take 10 mg by mouth daily.   MIDODRINE (PROAMATINE) 10 MG TABLET    Take 1 tablet (10 mg total) by mouth 3 (three) times daily with meals.   PANTOPRAZOLE (PROTONIX) 40 MG TABLET    Take 40 mg by mouth daily.   RISPERIDONE (RISPERDAL) 2 MG TABLET    Take 1 mg by mouth 2 (two) times daily.    SENNA-DOCUSATE (SENOKOT-S) 8.6-50 MG TABLET    Take 1 tablet by mouth at bedtime as needed for mild constipation.  Modified Medications   No medications on file  Discontinued Medications   No medications on file     SIGNIFICANT DIAGNOSTIC EXAMS   04-30-16: chest x-ray; Severe diffuse bilateral airspace disease. Differential includes multifocal pneumonia versus pulmonary edema.  05-08-16: chest x-ray: NG tube has been removed. Stable right PICC line position. No pneumothorax. Persistent patchy airspace disease in right upper lobe.   05-08-16: 2-d  echo: - Left ventricle: The cavity size was normal. Wall thickness was increased increased in a pattern of mild to moderate LVH. Systolic function was normal. The estimated ejection fraction was in the range of 55% to 60%. Wall motion was normal; there were no regional wall motion abnormalities.  05-09-16: ct angio of chest: No evidence of pulmonary embolism. Diffuse right upper lobe infiltrate with a somewhat nodular appearance. Short-term followup with noncontrast CT following appropriate therapy is recommended. Bilateral small pleural effusions.   05-14-16: swallow study: Recommend initiating a puree/pudding thick diet, no liquids though risk of aspiration of POs and secretions still high. Will follow for tolerance and diet advancement.    LABS REVIEWED:   04-30-16: wbc 6.1; hgb 7.5; hct 23.9; mcv 91.9; plt 143; glucose 91; bun 12; creat 0.89; k+ 4.2; na++ 144; liver normal albumin 2.7; mag 1.8; phos 3.4 ;tsh 3.560; cortisol 12.9; urine and blood culture: no growth 05-03-16; wbc 5.3; hgb 7.6; hct 23.7; mcv 90.1 ;plt 120; glucose 124; bun 16; creat 0.86; k+ 3.3; na++ 143; mag 2.0 phos 2.5 05-07-16: wbc 7.0; hgb 8.6; hct 28.4; mcv 93.7; plt 447; glucose 176; bun 18; creat 0.76; k+ 3.5; na++ 139 05-11-16: wbc 7.7; hgb 8.0; hct 26.0; mcv 93.9; plt 828; glucose 179; bun 12; creat 0.80; k+ 3.6; na++ 141 05-12-16: hgb a1c 6.1 05-17-16: glucose 144; bun 16 ;creat 0.62; k+ 3.5; na++ 142     Review of Systems  Unable to perform ROS: Psychiatric disorder    Physical Exam  Constitutional: No distress.  Eyes: Conjunctivae are normal.  Neck: Neck supple. No JVD present. No thyromegaly present.  Cardiovascular: Normal rate, regular rhythm and intact distal pulses.   Murmur heard. Respiratory: Effort normal and breath sounds normal. No respiratory distress. He has no wheezes.  GI: Soft. Bowel sounds are normal. He exhibits no distension. There is no tenderness.  Musculoskeletal: He exhibits no  edema.  Able to move all extremities   Lymphadenopathy:    He has no cervical adenopathy.  Neurological: He is alert.  Skin: Skin is warm and dry. He is not diaphoretic.  Psychiatric: He has a normal mood and affect.    ASSESSMENT/ PLAN:  Patient is being discharged with the following home health services:  Pt/ot to evaluate and treat as indicated for gait balance strength adl training.   Patient is being discharged with the following durable  medical equipment:  Standard high back wheelchair with elevated leg rests; cushion; anti-tippers; brake extensions in order to allow him to maintain current level of independence with adl's which cannot be achieved with a walker. Is able to self propel.   Patient has been advised to f/u with their PCP in 1-2 weeks to bring them up to date on their rehab stay.  Social services at facility was responsible for arranging this appointment.  Pt was provided with a 30 day supply of prescriptions for medications and refills must be obtained from their PCP.  For controlled substances, a more limited supply may be provided adequate until PCP appointment only.   Time spent with patient  45  minutes >50% time spent counseling; reviewing medical record; tests; labs; and developing future plan of care   Ok Edwards NP [facility]  Contact [phone] Monday through Friday 8am- 5pm  After hours call [phone]

## 2016-07-21 ENCOUNTER — Encounter (HOSPITAL_COMMUNITY): Payer: Self-pay

## 2016-07-21 ENCOUNTER — Emergency Department (HOSPITAL_COMMUNITY): Payer: Medicare Other

## 2016-07-21 ENCOUNTER — Inpatient Hospital Stay (HOSPITAL_COMMUNITY)
Admission: EM | Admit: 2016-07-21 | Discharge: 2016-07-26 | DRG: 391 | Disposition: A | Discharge status: Home / Self Care | Payer: Medicare Other | Attending: Internal Medicine | Admitting: Internal Medicine

## 2016-07-21 DIAGNOSIS — K922 Gastrointestinal hemorrhage, unspecified: Secondary | ICD-10-CM | POA: Diagnosis not present

## 2016-07-21 DIAGNOSIS — K5909 Other constipation: Secondary | ICD-10-CM | POA: Diagnosis present

## 2016-07-21 DIAGNOSIS — J690 Pneumonitis due to inhalation of food and vomit: Secondary | ICD-10-CM | POA: Diagnosis present

## 2016-07-21 DIAGNOSIS — F70 Mild intellectual disabilities: Secondary | ICD-10-CM | POA: Diagnosis present

## 2016-07-21 DIAGNOSIS — Z8701 Personal history of pneumonia (recurrent): Secondary | ICD-10-CM

## 2016-07-21 DIAGNOSIS — D649 Anemia, unspecified: Secondary | ICD-10-CM | POA: Diagnosis not present

## 2016-07-21 DIAGNOSIS — Z903 Acquired absence of stomach [part of]: Secondary | ICD-10-CM

## 2016-07-21 DIAGNOSIS — K2100 Gastro-esophageal reflux disease with esophagitis, without bleeding: Secondary | ICD-10-CM | POA: Diagnosis present

## 2016-07-21 DIAGNOSIS — Z8719 Personal history of other diseases of the digestive system: Secondary | ICD-10-CM

## 2016-07-21 DIAGNOSIS — D473 Essential (hemorrhagic) thrombocythemia: Secondary | ICD-10-CM | POA: Diagnosis not present

## 2016-07-21 DIAGNOSIS — G8250 Quadriplegia, unspecified: Secondary | ICD-10-CM | POA: Diagnosis not present

## 2016-07-21 DIAGNOSIS — Z91018 Allergy to other foods: Secondary | ICD-10-CM

## 2016-07-21 DIAGNOSIS — D62 Acute posthemorrhagic anemia: Secondary | ICD-10-CM | POA: Diagnosis present

## 2016-07-21 DIAGNOSIS — R0989 Other specified symptoms and signs involving the circulatory and respiratory systems: Secondary | ICD-10-CM

## 2016-07-21 DIAGNOSIS — E271 Primary adrenocortical insufficiency: Secondary | ICD-10-CM | POA: Diagnosis present

## 2016-07-21 DIAGNOSIS — Z79899 Other long term (current) drug therapy: Secondary | ICD-10-CM

## 2016-07-21 DIAGNOSIS — F319 Bipolar disorder, unspecified: Secondary | ICD-10-CM | POA: Diagnosis present

## 2016-07-21 DIAGNOSIS — G40909 Epilepsy, unspecified, not intractable, without status epilepticus: Secondary | ICD-10-CM | POA: Diagnosis present

## 2016-07-21 DIAGNOSIS — Z87820 Personal history of traumatic brain injury: Secondary | ICD-10-CM

## 2016-07-21 DIAGNOSIS — N400 Enlarged prostate without lower urinary tract symptoms: Secondary | ICD-10-CM | POA: Diagnosis present

## 2016-07-21 DIAGNOSIS — K219 Gastro-esophageal reflux disease without esophagitis: Secondary | ICD-10-CM

## 2016-07-21 DIAGNOSIS — Z8711 Personal history of peptic ulcer disease: Secondary | ICD-10-CM

## 2016-07-21 DIAGNOSIS — R509 Fever, unspecified: Secondary | ICD-10-CM

## 2016-07-21 DIAGNOSIS — Z6822 Body mass index (BMI) 22.0-22.9, adult: Secondary | ICD-10-CM

## 2016-07-21 DIAGNOSIS — D75839 Thrombocytosis, unspecified: Secondary | ICD-10-CM | POA: Diagnosis present

## 2016-07-21 DIAGNOSIS — Z87891 Personal history of nicotine dependence: Secondary | ICD-10-CM

## 2016-07-21 DIAGNOSIS — K2210 Ulcer of esophagus without bleeding: Secondary | ICD-10-CM | POA: Diagnosis present

## 2016-07-21 LAB — BASIC METABOLIC PANEL
Anion gap: 5 (ref 5–15)
BUN: 13 mg/dL (ref 6–20)
CALCIUM: 8.3 mg/dL — AB (ref 8.9–10.3)
CHLORIDE: 107 mmol/L (ref 101–111)
CO2: 28 mmol/L (ref 22–32)
CREATININE: 0.75 mg/dL (ref 0.61–1.24)
GFR calc non Af Amer: >60 mL/min (ref >60)
Glucose, Bld: 98 mg/dL (ref 65–99)
Potassium: 3.9 mmol/L (ref 3.5–5.1)
SODIUM: 140 mmol/L (ref 135–145)

## 2016-07-21 LAB — URINALYSIS, ROUTINE W REFLEX MICROSCOPIC
Bilirubin Urine: NEGATIVE
Glucose, UA: NEGATIVE mg/dL
Hgb urine dipstick: NEGATIVE
Ketones, ur: NEGATIVE mg/dL
LEUKOCYTES UA: NEGATIVE
NITRITE: NEGATIVE
PROTEIN: NEGATIVE mg/dL
Specific Gravity, Urine: 1.019 (ref 1.005–1.030)
pH: 6 (ref 5.0–8.0)

## 2016-07-21 LAB — CBC
HCT: 22.4 % — ABNORMAL LOW (ref 39.0–52.0)
Hemoglobin: 6 g/dL — CL (ref 13.0–17.0)
MCH: 19.2 pg — ABNORMAL LOW (ref 26.0–34.0)
MCHC: 26.8 g/dL — ABNORMAL LOW (ref 30.0–36.0)
MCV: 71.8 fL — ABNORMAL LOW (ref 78.0–100.0)
PLATELETS: 580 10*3/uL — AB (ref 150–400)
RBC: 3.12 MIL/uL — AB (ref 4.22–5.81)
RDW: 19.6 % — ABNORMAL HIGH (ref 11.5–15.5)
WBC: 6.8 10*3/uL (ref 4.0–10.5)

## 2016-07-21 LAB — PREPARE RBC (CROSSMATCH)

## 2016-07-21 LAB — CBG MONITORING, ED: GLUCOSE-CAPILLARY: 95 mg/dL (ref 65–99)

## 2016-07-21 MED ORDER — PANTOPRAZOLE SODIUM 40 MG PO TBEC
40.0000 mg | DELAYED_RELEASE_TABLET | Freq: Two times a day (BID) | ORAL | Status: DC
  Administered 2016-07-22 – 2016-07-26 (×9): 40 mg via ORAL
  Filled 2016-07-21 (×9): qty 1

## 2016-07-21 MED ORDER — BENZTROPINE MESYLATE 0.5 MG PO TABS
0.5000 mg | ORAL_TABLET | Freq: Every day | ORAL | Status: DC
  Administered 2016-07-22 – 2016-07-26 (×5): 0.5 mg via ORAL
  Filled 2016-07-21 (×6): qty 1

## 2016-07-21 MED ORDER — LAMOTRIGINE 100 MG PO TABS
100.0000 mg | ORAL_TABLET | Freq: Two times a day (BID) | ORAL | Status: DC
  Administered 2016-07-22 – 2016-07-26 (×9): 100 mg via ORAL
  Filled 2016-07-21 (×9): qty 1

## 2016-07-21 MED ORDER — SODIUM CHLORIDE 0.9 % IV SOLN
Freq: Once | INTRAVENOUS | Status: AC
  Administered 2016-07-22: 01:00:00 via INTRAVENOUS

## 2016-07-21 MED ORDER — HYDROCORTISONE 5 MG PO TABS
10.0000 mg | ORAL_TABLET | Freq: Every day | ORAL | Status: DC
  Administered 2016-07-22 – 2016-07-26 (×5): 10 mg via ORAL
  Filled 2016-07-21 (×5): qty 2

## 2016-07-21 MED ORDER — CARBAMAZEPINE 200 MG PO TABS
200.0000 mg | ORAL_TABLET | Freq: Three times a day (TID) | ORAL | Status: DC
  Administered 2016-07-22 – 2016-07-26 (×13): 200 mg via ORAL
  Filled 2016-07-21 (×15): qty 1

## 2016-07-21 MED ORDER — SODIUM CHLORIDE 0.9 % IV BOLUS (SEPSIS)
1000.0000 mL | Freq: Once | INTRAVENOUS | Status: AC
  Administered 2016-07-21: 1000 mL via INTRAVENOUS

## 2016-07-21 MED ORDER — ACETAMINOPHEN 650 MG RE SUPP: 650.0000 mg | Freq: Four times a day (QID) | RECTAL | Status: DC | PRN

## 2016-07-21 MED ORDER — ONDANSETRON HCL 4 MG/2ML IJ SOLN
4.0000 mg | Freq: Four times a day (QID) | INTRAMUSCULAR | Status: DC | PRN
  Administered 2016-07-24: 4 mg via INTRAVENOUS
  Filled 2016-07-21: qty 2

## 2016-07-21 MED ORDER — MIDODRINE HCL 5 MG PO TABS
10.0000 mg | ORAL_TABLET | Freq: Three times a day (TID) | ORAL | Status: DC
Start: 2016-07-22 — End: 2016-07-26
  Administered 2016-07-22 – 2016-07-26 (×14): 10 mg via ORAL
  Filled 2016-07-21 (×15): qty 2

## 2016-07-21 MED ORDER — ESCITALOPRAM OXALATE 10 MG PO TABS
10.0000 mg | ORAL_TABLET | Freq: Every day | ORAL | Status: DC
  Administered 2016-07-22 – 2016-07-26 (×5): 10 mg via ORAL
  Filled 2016-07-21 (×5): qty 1

## 2016-07-21 MED ORDER — ALBUTEROL SULFATE (2.5 MG/3ML) 0.083% IN NEBU: 2.5000 mg | INHALATION_SOLUTION | RESPIRATORY_TRACT | Status: DC | PRN

## 2016-07-21 MED ORDER — ACETAMINOPHEN 325 MG PO TABS
650.0000 mg | ORAL_TABLET | Freq: Four times a day (QID) | ORAL | Status: DC | PRN
  Administered 2016-07-22 – 2016-07-25 (×4): 650 mg via ORAL
  Filled 2016-07-21 (×5): qty 2

## 2016-07-21 MED ORDER — ONDANSETRON HCL 4 MG PO TABS
4.0000 mg | ORAL_TABLET | Freq: Four times a day (QID) | ORAL | Status: DC | PRN
  Filled 2016-07-21: qty 1

## 2016-07-21 NOTE — H&P (Addendum)
History and Physical    [name] [id number] DOB: August 30, 1965 DOA: 07/21/2016  Referring MD/NP/PA: Dr. Gilford Raid PCP: [name], MD  Patient coming from: PCP office  Chief Complaint: Abnormal lab work  HPI: [name] is a 51 y.o. male with medical history significant of TBI, MR, spastic quadriplegia, chronic anemia, and s/p gasterctomy; who presents with abnormal lab work. Patient's caregiver is at bedside and helps provide additional history. Patient had complained of some abdominal pain sometime last week, but was given Pepto-Bismol with relief of symptoms. Caregiver notes associated symptoms of intermittent cough. He has not complained of any abdominal pain, shortness of breath, nausea, vomiting, blood in stool/urine, or diarrhea. Caregiver denies any NSAID use. Previously hospitalized 2 months ago with acute respiratory failure requiring intubation found to have likely aspiration pneumonia. During the hospitalization patient's hemoglobin dropped and required transfusion of 1 unit of PRBC. Lab work that had recently been obtained was noted to have low blood counts for which the patient was sent to the hospital for further evaluation.   ED Course: Upon admission into the emergency department patient was found to be afebrile, pulse 96-103, respirations 17-22, and all other vitals within normal limits. Lab work revealed hemoglobin 6, platelets 580, and all other labs within normal limits. Stool guaiac was pending. Chest x-ray showed right suprahilar and basilar opacities suspicious for atelectasis versus recurrent pneumonia. Patient was ordered to be transfused 2 units of PRBCs.  Review of Systems: As per HPI otherwise 10 point review of systems negative.   Past Medical History:  Diagnosis Date  . Anxiety   . Bipolar disorder ([place])   . BPH (benign prostatic hyperplasia)   . Closed head injury   . Constipation   . Depression   . Esophageal ulcer   . Malnutrition ([place])   . Mental  retardation, mild (I.Q. 50-70)   . 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 Peptic ulcer disease   . Seizures ([place])   . Smoker     Past Surgical History:  Procedure Laterality Date  . ESOPHAGOGASTRODUODENOSCOPY  06/17/2011   Procedure: ESOPHAGOGASTRODUODENOSCOPY (EGD);  Surgeon: Wonda Horner, MD;  Location: [facility];  Service: Endoscopy;  Laterality: N/A;  . GASTRECTOMY       reports that he quit smoking about 6 years ago. He smoked 0.50 packs per day. He quit smokeless tobacco use about 15 years ago. He reports that he does not drink alcohol or use drugs.  Allergies  Allergen Reactions  . Pineapple Anaphylaxis    Family History  Problem Relation Age of Onset  . Family history unknown: Yes    Prior to Admission medications   Medication Sig Start Date End Date Taking? Authorizing Provider  benztropine (COGENTIN) 0.5 MG tablet Take 0.5 mg by mouth every morning.    Yes Historical Provider, MD  carbamazepine (TEGRETOL) 200 MG tablet Take 1 tablet (200 mg total) by mouth 3 (three) times daily. 05/17/16  Yes Eber Jones, MD  escitalopram (LEXAPRO) 10 MG tablet Take 10 mg by mouth every morning.    Yes Historical Provider, MD  hydrocortisone (CORTEF) 5 MG tablet Take 1 tablet (5 mg total) by mouth daily at 12 noon. Patient taking differently: Take 5-10 mg by mouth 2 (two) times daily. Take 10 mg every morning and 5 mg every evening 05/18/16  Yes Eber Jones, MD  lamoTRIgine (LAMICTAL) 100 MG tablet Take 1 tablet (100 mg total) by mouth 2 (two)  times daily. 05/17/16  Yes Eber Jones, MD  midodrine (PROAMATINE) 10 MG tablet Take 1 tablet (10 mg total) by mouth 3 (three) times daily with meals. 05/17/16  Yes Eber Jones, MD  pantoprazole (PROTONIX) 40 MG tablet Take 40 mg by mouth every morning.    Yes Historical Provider, MD  docusate sodium (COLACE) 100 MG capsule Take 1 capsule (100 mg total) by mouth 2  (two) times daily. Patient not taking: Reported on 07/21/2016 05/17/16   Eber Jones, MD  famotidine (PEPCID) 20 MG tablet Take 1 tablet (20 mg total) by mouth 2 (two) times daily. Patient not taking: Reported on 07/21/2016 05/17/16   Eber Jones, MD  hydrocortisone (CORTEF) 10 MG tablet Take 1 tablet (10 mg total) by mouth daily with breakfast. Please give at 7am every morning Patient not taking: Reported on 07/21/2016 05/18/16   Eber Jones, MD  senna-docusate (SENOKOT-S) 8.6-50 MG tablet Take 1 tablet by mouth at bedtime as needed for mild constipation. Patient not taking: Reported on 07/21/2016 05/17/16   Eber Jones, MD    Physical Exam:  Constitutional: NAD, calm, comfortable Vitals:   07/21/16 2130 07/21/16 2150 07/21/16 2200 07/21/16 2304  BP: 103/75  110/60 102/59  Pulse: 98  96 101  Resp: 18  19 17   Temp:      TempSrc:      SpO2: 96%  98% 99%  Weight:  81.6 kg (180 lb)    Height:  6\' 3"  (1.905 m)     Eyes: PERRL, lids and conjunctivae normal ENMT: Mucous membranes are moist. Posterior pharynx clear of any exudate or lesions.Normal dentition.  Neck: normal, supple, no masses, no thyromegaly Respiratory: clear to auscultation bilaterally, no wheezing, no crackles. Normal respiratory effort. No accessory muscle use.  Cardiovascular: Regular rate and rhythm, no murmurs / rubs / gallops. No extremity edema. 2+ pedal pulses. No carotid bruits.  Abdomen: no tenderness, no masses palpated. No hepatosplenomegaly. Bowel sounds positive.  Musculoskeletal: no clubbing / cyanosis. Contractures noted of the upper and lower extremities. Skin: Pallor. no rashes, lesions, ulcers. No induration Neurologic: CN 2-12 grossly intact. Spastic quadriparesis ts noted secondary to previous TBI   Labs on Admission: I have personally reviewed following labs and imaging studies  CBC:  Recent Labs Lab 07/21/16 1940  WBC 6.8  HGB 6.0*  HCT 22.4*  MCV 71.8*    PLT 123456*   Basic Metabolic Panel:  Recent Labs Lab 07/21/16 1940  NA 140  K 3.9  CL 107  CO2 28  GLUCOSE 98  BUN 13  CREATININE 0.75  CALCIUM 8.3*   GFR: Estimated Creatinine Clearance: 128.9 mL/min (by C-G formula based on SCr of 0.75 mg/dL). Liver Function Tests: No results for input(s): AST, ALT, ALKPHOS, BILITOT, PROT, ALBUMIN in the last 168 hours. No results for input(s): LIPASE, AMYLASE in the last 168 hours. No results for input(s): AMMONIA in the last 168 hours. Coagulation Profile: No results for input(s): INR, PROTIME in the last 168 hours. Cardiac Enzymes: No results for input(s): CKTOTAL, CKMB, CKMBINDEX, TROPONINI in the last 168 hours. BNP (last 3 results) No results for input(s): PROBNP in the last 8760 hours. HbA1C: No results for input(s): HGBA1C in the last 72 hours. CBG:  Recent Labs Lab 07/21/16 2117  GLUCAP 95   Lipid Profile: No results for input(s): CHOL, HDL, LDLCALC, TRIG, CHOLHDL, LDLDIRECT in the last 72 hours. Thyroid Function Tests: No results for input(s): TSH, T4TOTAL, FREET4, T3FREE, THYROIDAB  in the last 72 hours. Anemia Panel: No results for input(s): VITAMINB12, FOLATE, FERRITIN, TIBC, IRON, RETICCTPCT in the last 72 hours. Urine analysis:    Component Value Date/Time   COLORURINE YELLOW 07/21/2016 1937   APPEARANCEUR CLEAR 07/21/2016 1937   LABSPEC 1.019 07/21/2016 1937   PHURINE 6.0 07/21/2016 1937   GLUCOSEU NEGATIVE 07/21/2016 1937   HGBUR NEGATIVE 07/21/2016 1937   HGBUR negative 07/31/2009 [place] 07/21/2016 1937   KETONESUR NEGATIVE 07/21/2016 1937   PROTEINUR NEGATIVE 07/21/2016 1937   UROBILINOGEN 0.2 09/11/2014 1951   NITRITE NEGATIVE 07/21/2016 1937   LEUKOCYTESUR NEGATIVE 07/21/2016 1937   Sepsis Labs: No results found for this or any previous visit (from the past 240 hour(s)).   Radiological Exams on Admission: Dg Chest Portable 1 View  Result Date: 07/21/2016 CLINICAL DATA:   Cough. EXAM: PORTABLE CHEST 1 VIEW COMPARISON:  Radiograph 05/12/2016, CT 05/09/2016 FINDINGS: Improved right lung aeration with minimal residual ill-defined suprahilar and right basilar patchy opacity. Stable cardiomegaly and mediastinal contours. Mild interstitial and bronchial thickening. No pleural fluid or pneumothorax. Unchanged osseous structures. IMPRESSION: Improved right lung aeration from exam 3 months prior with residual or recurrent right basilar and suprahilar patchy opacities, atelectasis/scarring versus recurrent infection. Electronically Signed   By: Jeb Levering M.D.   On: 07/21/2016 22:33    EKG: Independently reviewed.Sinus rhythm with low voltage  Assessment/Plan Symptomatic anemia/ question GI bleed: Acute. Patient found to have a hemoglobin of 6 on admission. Stool guaiac pending. - Admit to a telemetry bed - Continue with transfusion of 2 units of PRBC - Check serial H&H's - Follow-up stool guaiac  - Will need to consult GI in a.m.   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  Thrombocytosis- Platelets 580 on admission suspect this could be reactive. -  Continue to monitor  Adrenal insufficiency - Continue hydrocortisone   GERD - Continue Protonix  DVT prophylaxis: SCD Code Status: Full Family Communication: Discussed plan of care with the patient and caregiver present at bedside Disposition Plan: Discharge to group home once medically stable   Consults called: none Admission status: Observation  Norval Morton MD [facility] Pager [phone]  If 7PM-7AM, please contact night-coverage [email or web address] Password [facility]  07/21/2016, 11:22 PM

## 2016-07-21 NOTE — ED Notes (Signed)
Pt presents to ED for low hemoglobin level that was drawn today. Caregiver concerned of pt having return of pneumonia due to coughing.

## 2016-07-21 NOTE — ED Notes (Signed)
Consent for blood obtained.

## 2016-07-21 NOTE — ED Provider Notes (Signed)
[facility] DEPT Provider Note   CSN: [id number] Arrival date & time: 07/21/16  [place]     History   Chief Complaint Chief Complaint  Patient presents with  . abnormal labs    HPI [name] is a 51 y.o. male.  Pt with a hx of TBI and MR with spastic quadriplegia presents to the ED today with low hemoglobin.  The pt had some blood drawn today by pcp and was told to come to the ED.  Pt denies any black or bloody stool.  The pt feels weak.  The caregiver said he's been coughing a lot, so she was worried about pneumonia.  No fevers.      Past Medical History:  Diagnosis Date  . Anxiety   . Bipolar disorder ([place])   . BPH (benign prostatic hyperplasia)   . Closed head injury   . Constipation   . Depression   . Esophageal ulcer   . Malnutrition ([place])   . 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 Peptic ulcer disease   . Seizures ([place])   . Smoker     Patient Active Problem List   Diagnosis Date Noted  . Adrenal insufficiency (Addison's disease) ([place]) 06/13/2016  . Bipolar I disorder ([place]) 06/13/2016  . Allergic rhinitis 06/13/2016  . Slow transit constipation 06/13/2016  . Pneumothorax, right   . HAP (hospital-acquired pneumonia)   . Bilateral pulmonary infiltrates on chest x-ray   . Arterial hypotension   . HCAP (healthcare-associated pneumonia)   . Acute respiratory failure with hypoxemia ([place]) 04/30/2016  . Anemia, iron deficiency 04/25/2016  . Altered mental status   . Aspiration pneumonia of right lower lobe due to gastric secretions ([place])   . Sepsis ([place]) 04/23/2016  . Pressure injury of skin 04/23/2016  . Community acquired pneumonia of right lower lobe of lung ([place]) 04/22/2016  . Absolute anemia 02/22/2015  . Closed head injury 02/22/2015  . Spastic quadriparesis ([place]) 02/22/2015  . Anxiety state 02/22/2015  . 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 Depression  03/28/2007  . GERD without esophagitis 02/24/2007  . 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 ESOPHAGOGASTRODUODENOSCOPY  06/17/2011   Procedure: ESOPHAGOGASTRODUODENOSCOPY (EGD);  Surgeon: Wonda Horner, MD;  Location: [facility];  Service: Endoscopy;  Laterality: N/A;  . GASTRECTOMY         Home Medications    Prior to Admission medications   Medication Sig Start Date End Date Taking? Authorizing Provider  benztropine (COGENTIN) 0.5 MG tablet Take 0.5 mg by mouth every morning.    Yes Historical Provider, MD  carbamazepine (TEGRETOL) 200 MG tablet Take 1 tablet (200 mg total) by mouth 3 (three) times daily. 05/17/16  Yes Eber Jones, MD  escitalopram (LEXAPRO) 10 MG tablet Take 10 mg by mouth every morning.    Yes Historical Provider, MD  hydrocortisone (CORTEF) 5 MG tablet Take 1 tablet (5 mg total) by mouth daily at 12 noon. Patient taking differently: Take 5-10 mg by mouth 2 (two) times daily. Take 10 mg every morning and 5 mg every evening 05/18/16  Yes Eber Jones, MD  lamoTRIgine (LAMICTAL) 100 MG tablet Take 1 tablet (100 mg total) by mouth 2 (two) times daily. 05/17/16  Yes Eber Jones, MD  midodrine (PROAMATINE) 10 MG tablet Take 1 tablet (10 mg total) by mouth 3 (three) times  daily with meals. 05/17/16  Yes Eber Jones, MD  pantoprazole (PROTONIX) 40 MG tablet Take 40 mg by mouth every morning.    Yes Historical Provider, MD  docusate sodium (COLACE) 100 MG capsule Take 1 capsule (100 mg total) by mouth 2 (two) times daily. Patient not taking: Reported on 07/21/2016 05/17/16   Eber Jones, MD  famotidine (PEPCID) 20 MG tablet Take 1 tablet (20 mg total) by mouth 2 (two) times daily. Patient not taking: Reported on 07/21/2016 05/17/16   Eber Jones, MD  hydrocortisone (CORTEF) 10 MG tablet Take 1 tablet (10 mg total) by mouth daily with breakfast. Please give  at 7am every morning Patient not taking: Reported on 07/21/2016 05/18/16   Eber Jones, MD  senna-docusate (SENOKOT-S) 8.6-50 MG tablet Take 1 tablet by mouth at bedtime as needed for mild constipation. Patient not taking: Reported on 07/21/2016 05/17/16   Eber Jones, MD    Family History Family History  Problem Relation Age of Onset  . Family history unknown: Yes    Social History Social History  Substance Use Topics  . Smoking status: Former Smoker    Packs/day: 0.50    Quit date: 05/26/2010  . Smokeless tobacco: Former Systems developer    Quit date: 06/08/2001  . Alcohol use No     Allergies   Pineapple   Review of Systems Review of Systems  Respiratory: Positive for cough.   Neurological: Positive for weakness.  All other systems reviewed and are negative.    Physical Exam Updated Vital Signs BP 102/59 (BP Location: Right Arm)   Pulse 101   Temp 97.8 F (36.6 C) (Oral)   Resp 17   Ht 6\' 3"  (1.905 m)   Wt 180 lb (81.6 kg)   SpO2 99%   BMI 22.50 kg/m   Physical Exam  Constitutional: He is oriented to person, place, and time. He appears well-developed and well-nourished.  HENT:  Head: Atraumatic.  Nose: Nose normal.  Mouth/Throat: Oropharynx is clear and moist.  Eyes: Pupils are equal, round, and reactive to light.  Left eye: with outward ambylopia  Neck: Normal range of motion.  Cardiovascular: Normal heart sounds and intact distal pulses.  Tachycardia present.   Pulmonary/Chest: Effort normal and breath sounds normal.  Abdominal: Soft. Bowel sounds are normal.  Genitourinary: Rectal exam shows guaiac negative stool.  Genitourinary Comments: Mucous only on rectal exam  Musculoskeletal:  Spastic quadriplegia  Neurological: He is alert and oriented to person, place, and time.  Skin: Skin is warm.  Psychiatric: He has a normal mood and affect.  Nursing note and vitals reviewed.    ED Treatments / Results  Labs (all labs ordered are listed,  but only abnormal results are displayed) Labs Reviewed  BASIC METABOLIC PANEL - Abnormal; Notable for the following:       Result Value   Calcium 8.3 (*)    All other components within normal limits  CBC - Abnormal; Notable for the following:    RBC 3.12 (*)    Hemoglobin 6.0 (*)    HCT 22.4 (*)    MCV 71.8 (*)    MCH 19.2 (*)    MCHC 26.8 (*)    RDW 19.6 (*)    Platelets 580 (*)    All other components within normal limits  URINALYSIS, ROUTINE W REFLEX MICROSCOPIC  CBG MONITORING, ED  POC OCCULT BLOOD, ED  TYPE AND SCREEN  PREPARE RBC (CROSSMATCH)    EKG  EKG  Interpretation  Date/Time:  Monday July 21 2016 22:21:38 EST Ventricular Rate:  99 PR Interval:    QRS Duration: 107 QT Interval:  352 QTC Calculation: 452 R Axis:   130 Text Interpretation:  Sinus rhythm Low voltage, precordial leads Probable right ventricular hypertrophy Confirmed by Gilford Raid MD, Hara Milholland ([id number]) on 07/21/2016 10:33:58 PM       Radiology Dg Chest Portable 1 View  Result Date: 07/21/2016 CLINICAL DATA:  Cough. EXAM: PORTABLE CHEST 1 VIEW COMPARISON:  Radiograph 05/12/2016, CT 05/09/2016 FINDINGS: Improved right lung aeration with minimal residual ill-defined suprahilar and right basilar patchy opacity. Stable cardiomegaly and mediastinal contours. Mild interstitial and bronchial thickening. No pleural fluid or pneumothorax. Unchanged osseous structures. IMPRESSION: Improved right lung aeration from exam 3 months prior with residual or recurrent right basilar and suprahilar patchy opacities, atelectasis/scarring versus recurrent infection. Electronically Signed   By: Jeb Levering M.D.   On: 07/21/2016 22:33    Procedures Procedures (including critical care time)  Medications Ordered in ED Medications  0.9 %  sodium chloride infusion (not administered)  sodium chloride 0.9 % bolus 1,000 mL (1,000 mLs Intravenous New Bag/Given 07/21/16 2307)     Initial Impression / Assessment and Plan /  ED Course  I have reviewed the triage vital signs and the nursing notes.  Pertinent labs & imaging results that were available during my care of the patient were reviewed by me and considered in my medical decision making (see chart for details).     pRBCs ordered.  The etiology of the blood loss is unclear.  Pt d/w (Dr. Tamala Julian)  hospitalists for admission.  Final Clinical Impressions(s) / ED Diagnoses   Final diagnoses:  Symptomatic anemia    New Prescriptions New Prescriptions   No medications on file     Isla Pence, MD 07/21/16 [phone]

## 2016-07-21 NOTE — ED Triage Notes (Signed)
Pt had blood drawn today and was told that his hemoglobin was low and to come to the ER

## 2016-07-21 NOTE — ED Notes (Signed)
IV access attempted x 3 all unsuccessful.

## 2016-07-21 NOTE — ED Notes (Signed)
Pt assisted with urinal

## 2016-07-22 ENCOUNTER — Inpatient Hospital Stay (HOSPITAL_COMMUNITY): Payer: Medicare Other

## 2016-07-22 DIAGNOSIS — D473 Essential (hemorrhagic) thrombocythemia: Secondary | ICD-10-CM | POA: Diagnosis present

## 2016-07-22 DIAGNOSIS — D75839 Thrombocytosis, unspecified: Secondary | ICD-10-CM | POA: Diagnosis present

## 2016-07-22 DIAGNOSIS — G8250 Quadriplegia, unspecified: Secondary | ICD-10-CM | POA: Diagnosis present

## 2016-07-22 DIAGNOSIS — K922 Gastrointestinal hemorrhage, unspecified: Secondary | ICD-10-CM | POA: Diagnosis present

## 2016-07-22 DIAGNOSIS — K2210 Ulcer of esophagus without bleeding: Secondary | ICD-10-CM | POA: Diagnosis present

## 2016-07-22 DIAGNOSIS — K210 Gastro-esophageal reflux disease with esophagitis: Secondary | ICD-10-CM | POA: Diagnosis present

## 2016-07-22 DIAGNOSIS — Z6822 Body mass index (BMI) 22.0-22.9, adult: Secondary | ICD-10-CM | POA: Diagnosis not present

## 2016-07-22 DIAGNOSIS — D649 Anemia, unspecified: Secondary | ICD-10-CM | POA: Diagnosis present

## 2016-07-22 DIAGNOSIS — Z8711 Personal history of peptic ulcer disease: Secondary | ICD-10-CM | POA: Diagnosis not present

## 2016-07-22 DIAGNOSIS — Z903 Acquired absence of stomach [part of]: Secondary | ICD-10-CM | POA: Diagnosis not present

## 2016-07-22 DIAGNOSIS — J690 Pneumonitis due to inhalation of food and vomit: Secondary | ICD-10-CM | POA: Diagnosis present

## 2016-07-22 DIAGNOSIS — F70 Mild intellectual disabilities: Secondary | ICD-10-CM | POA: Diagnosis present

## 2016-07-22 DIAGNOSIS — Z91018 Allergy to other foods: Secondary | ICD-10-CM | POA: Diagnosis not present

## 2016-07-22 DIAGNOSIS — Z87820 Personal history of traumatic brain injury: Secondary | ICD-10-CM | POA: Diagnosis not present

## 2016-07-22 DIAGNOSIS — F319 Bipolar disorder, unspecified: Secondary | ICD-10-CM | POA: Diagnosis present

## 2016-07-22 DIAGNOSIS — G40909 Epilepsy, unspecified, not intractable, without status epilepticus: Secondary | ICD-10-CM | POA: Diagnosis present

## 2016-07-22 DIAGNOSIS — K5909 Other constipation: Secondary | ICD-10-CM | POA: Diagnosis present

## 2016-07-22 DIAGNOSIS — E271 Primary adrenocortical insufficiency: Secondary | ICD-10-CM | POA: Diagnosis not present

## 2016-07-22 DIAGNOSIS — Z87891 Personal history of nicotine dependence: Secondary | ICD-10-CM | POA: Diagnosis not present

## 2016-07-22 DIAGNOSIS — D62 Acute posthemorrhagic anemia: Secondary | ICD-10-CM | POA: Diagnosis not present

## 2016-07-22 DIAGNOSIS — Z79899 Other long term (current) drug therapy: Secondary | ICD-10-CM | POA: Diagnosis not present

## 2016-07-22 DIAGNOSIS — N400 Enlarged prostate without lower urinary tract symptoms: Secondary | ICD-10-CM | POA: Diagnosis present

## 2016-07-22 DIAGNOSIS — Z8719 Personal history of other diseases of the digestive system: Secondary | ICD-10-CM | POA: Diagnosis not present

## 2016-07-22 LAB — BASIC METABOLIC PANEL
ANION GAP: 8 (ref 5–15)
BUN: 8 mg/dL (ref 6–20)
CALCIUM: 7.9 mg/dL — AB (ref 8.9–10.3)
CO2: 26 mmol/L (ref 22–32)
CREATININE: 0.94 mg/dL (ref 0.61–1.24)
Chloride: 104 mmol/L (ref 101–111)
GLUCOSE: 113 mg/dL — AB (ref 65–99)
Potassium: 3.6 mmol/L (ref 3.5–5.1)
Sodium: 138 mmol/L (ref 135–145)

## 2016-07-22 LAB — CBC WITH DIFFERENTIAL/PLATELET
BASOS ABS: 0.1 10*3/uL (ref 0.0–0.1)
BASOS PCT: 1 %
EOS ABS: 0.1 10*3/uL (ref 0.0–0.7)
Eosinophils Relative: 1 %
HCT: 27.1 % — ABNORMAL LOW (ref 39.0–52.0)
HEMOGLOBIN: 8 g/dL — AB (ref 13.0–17.0)
LYMPHS ABS: 0.9 10*3/uL (ref 0.7–4.0)
Lymphocytes Relative: 19 %
MCH: 21.4 pg — AB (ref 26.0–34.0)
MCHC: 29.5 g/dL — AB (ref 30.0–36.0)
MCV: 72.7 fL — ABNORMAL LOW (ref 78.0–100.0)
MONO ABS: 0.9 10*3/uL (ref 0.1–1.0)
Monocytes Relative: 18 %
NEUTROS ABS: 2.9 10*3/uL (ref 1.7–7.7)
Neutrophils Relative %: 61 %
PLATELETS: 438 10*3/uL — AB (ref 150–400)
RBC: 3.73 MIL/uL — ABNORMAL LOW (ref 4.22–5.81)
RDW: 19.9 % — ABNORMAL HIGH (ref 11.5–15.5)
WBC: 4.9 10*3/uL (ref 4.0–10.5)

## 2016-07-22 LAB — COMPREHENSIVE METABOLIC PANEL
ALK PHOS: 98 U/L (ref 38–126)
ALT: 10 U/L — ABNORMAL LOW (ref 17–63)
AST: 17 U/L (ref 15–41)
Albumin: 2.6 g/dL — ABNORMAL LOW (ref 3.5–5.0)
Anion gap: 5 (ref 5–15)
BUN: 8 mg/dL (ref 6–20)
CALCIUM: 7.8 mg/dL — AB (ref 8.9–10.3)
CO2: 26 mmol/L (ref 22–32)
Chloride: 105 mmol/L (ref 101–111)
Creatinine, Ser: 0.93 mg/dL (ref 0.61–1.24)
GFR calc non Af Amer: >60 mL/min (ref >60)
Glucose, Bld: 86 mg/dL (ref 65–99)
POTASSIUM: 3.9 mmol/L (ref 3.5–5.1)
SODIUM: 136 mmol/L (ref 135–145)
Total Bilirubin: 0.4 mg/dL (ref 0.3–1.2)
Total Protein: 6.5 g/dL (ref 6.5–8.1)

## 2016-07-22 LAB — OCCULT BLOOD X 1 CARD TO LAB, STOOL: Fecal Occult Bld: NEGATIVE

## 2016-07-22 LAB — CBC
HEMATOCRIT: 27.9 % — AB (ref 39.0–52.0)
HEMATOCRIT: 28 % — AB (ref 39.0–52.0)
Hemoglobin: 8.2 g/dL — ABNORMAL LOW (ref 13.0–17.0)
Hemoglobin: 8.1 g/dL — ABNORMAL LOW (ref 13.0–17.0)
MCH: 21.6 pg — ABNORMAL LOW (ref 26.0–34.0)
MCH: 20.9 pg — ABNORMAL LOW (ref 26.0–34.0)
MCHC: 29.4 g/dL — AB (ref 30.0–36.0)
MCHC: 28.9 g/dL — AB (ref 30.0–36.0)
MCV: 73.6 fL — AB (ref 78.0–100.0)
MCV: 72.2 fL — AB (ref 78.0–100.0)
PLATELETS: 468 10*3/uL — AB (ref 150–400)
Platelets: 456 10*3/uL — ABNORMAL HIGH (ref 150–400)
RBC: 3.79 MIL/uL — ABNORMAL LOW (ref 4.22–5.81)
RBC: 3.88 MIL/uL — ABNORMAL LOW (ref 4.22–5.81)
RDW: 19.6 % — AB (ref 11.5–15.5)
RDW: 19.4 % — AB (ref 11.5–15.5)
WBC: 5.7 10*3/uL (ref 4.0–10.5)
WBC: 4 10*3/uL (ref 4.0–10.5)

## 2016-07-22 LAB — MRSA PCR SCREENING: MRSA by PCR: NEGATIVE

## 2016-07-22 LAB — LACTIC ACID, PLASMA: Lactic Acid, Venous: 0.8 mmol/L (ref 0.5–1.9)

## 2016-07-22 MED ORDER — HYDROCORTISONE 5 MG PO TABS
5.0000 mg | ORAL_TABLET | Freq: Every day | ORAL | Status: DC
  Administered 2016-07-22 – 2016-07-25 (×4): 5 mg via ORAL
  Filled 2016-07-22 (×5): qty 1

## 2016-07-22 NOTE — ED Notes (Signed)
5th floor contacted and advised that nurse was receiving report at this time.

## 2016-07-22 NOTE — Progress Notes (Signed)
PROGRESS NOTE  [name]  [id number] DOB: June 13, 1965 DOA: 07/21/2016 PCP: [name], MD  Outpatient Specialists: Howie Ill, Dr. Michail Sermon  Brief Narrative: [name] is a 51 y.o. male with a history of TBI, MR, spastic paraplegia, chronic anemia, and s/p gasterctomy who presented with abnormal lab work. He had recent blood work showing an abnormally low hemoglobin, so he was sent to the ED for evaluation. He has complained of some abdominal pain sometime last week improved with pepto-bismol, but otherwise denies any symptoms. He has chronic constipation and denies noticing any GI bleeding, recent abdominal pain, N/V/D. No dyspnea, chest pain or palpitations. He had been hospitalized 2 months prior with acute respiratory failure due to aspiration pneumonia requiring intubation. Hemoglobin progressively dropped during that admission and he required 1u PRBCs. No work up was performed at that time, discharge hemoglobin was 8.8. In 2013 EGD showed erosive esophagitis and hiatal hernia, colonoscopy showed only internal hemorrhoids.    On arrival to the ED, the patient appeared afebrile, pulse 96-103, respirations 17-22, and all other vitals within normal limits. Lab work revealed hemoglobin 6, platelets 580, and all other labs within normal limits. Chest x-ray showed right suprahilar and basilar opacities suspicious for atelectasis versus recurrent pneumonia. Patient was ordered to be transfused 2 units of PRBCs with improvement in hemoglobin to 8.2. GI was consulted for acute anemia and concern for GI bleeding.  Assessment & Plan: Principal Problem:   Symptomatic anemia Active Problems:   GERD without esophagitis   Spastic quadriparesis (HCC)   Adrenal insufficiency (Addison's disease) (HCC)   GI bleed   Thrombocytosis (HCC)  Acute anemia: Hgb 6 on admission from 8.8 at discharge in Dec 2017. FOBT negative x1. No reports of GI bleeding though pt may not be reliable historian. - Check  serial H&H's for stability - GI consulted, pt on clear liquid diet.   GERD: With h/o ulcerative esophagitis, hiatal hernia in EGD 2013. - Continue PPI  History of traumatic brain injury: w/ cognitive deficits and spastic quadriparesis: Chronic, stable - Continue home medications: carbamazepine, lamictal, lexapro, cogentin  History of aspiration pneumonia: Currently no symptoms of recurrent pneumonia, no vital sign abnormalities, and no leukocytosis. CXR shows residual atelectasis vs. scarring vs. recurrent infiltrate.  - Monitor for S/Sxs of infection   Thrombocytosis: Platelets 580 on admission. Suspected to be reactive. -  Continue to monitor  Adrenal insufficiency: Chronic, stable - Continue hydrocortisone, midodrine   DVT prophylaxis: SCDs Code Status: Full Family Communication: None at bedside Disposition Plan: Anticipate DC back to group home if hemoglobin stable. GI evaluation pending.  Consultants:   [facility]  Procedures:   Condom catheter 2/26  Antimicrobials:  None   Subjective: Pt without complaints. Denies noticing unusual bleeding or bruising. No chest pain, dyspnea, palpitations.  Objective: Vitals:   07/22/16 0324 07/22/16 0424 07/22/16 0510 07/22/16 0645  BP: 117/62 126/64 124/65 118/60  Pulse: 87 90 88 94  Resp: 20 18 20 18   Temp: 98.1 F (36.7 C) 98.5 F (36.9 C) 98.5 F (36.9 C) 98.6 F (37 C)  TempSrc: Oral Oral Oral Oral  SpO2: 99% 98% 98% 99%  Weight:      Height:        Intake/Output Summary (Last 24 hours) at 07/22/16 1344 Last data filed at 07/22/16 1256  Gross per 24 hour  Intake          2821.25 ml  Output  3110 ml  Net          -288.75 ml   Filed Weights   07/21/16 2150 07/22/16 0210  Weight: 81.6 kg (180 lb) 81.6 kg (180 lb)    Examination: General exam: Pale 51yo male in no distress Respiratory system: Non-labored breathing room air. Clear to auscultation bilaterally.  Cardiovascular system: Regular rate  and rhythm. No murmur, rub, or gallop. No JVD, and trace pitting LE edema. Gastrointestinal system: Abdomen soft, non-tender, non-distended, with normoactive bowel sounds. No organomegaly or masses felt. Central nervous system: Moves all extremities with significant diffuse weakness. CN II-XI intact.  Extremities: Warm, no deformities Skin: No rashes, bleeding or bruising Psychiatry: Judgement and insight appear impaired. Mood euthymic & affect abnormal  Data Reviewed: I have personally reviewed following labs and imaging studies  CBC:  Recent Labs Lab 07/21/16 1940 07/22/16 0848  WBC 6.8 5.7  HGB 6.0* 8.2*  HCT 22.4* 27.9*  MCV 71.8* 73.6*  PLT 580* [id number]*   Basic Metabolic Panel:  Recent Labs Lab 07/21/16 1940 07/22/16 0848  NA 140 138  K 3.9 3.6  CL 107 104  CO2 28 26  GLUCOSE 98 113*  BUN 13 8  CREATININE 0.75 0.94  CALCIUM 8.3* 7.9*   GFR: Estimated Creatinine Clearance: 109.7 mL/min (by C-G formula based on SCr of 0.94 mg/dL). Liver Function Tests: No results for input(s): AST, ALT, ALKPHOS, BILITOT, PROT, ALBUMIN in the last 168 hours. No results for input(s): LIPASE, AMYLASE in the last 168 hours. No results for input(s): AMMONIA in the last 168 hours. Coagulation Profile: No results for input(s): INR, PROTIME in the last 168 hours. Cardiac Enzymes: No results for input(s): CKTOTAL, CKMB, CKMBINDEX, TROPONINI in the last 168 hours. BNP (last 3 results) No results for input(s): PROBNP in the last 8760 hours. HbA1C: No results for input(s): HGBA1C in the last 72 hours. CBG:  Recent Labs Lab 07/21/16 2117  GLUCAP 95   Lipid Profile: No results for input(s): CHOL, HDL, LDLCALC, TRIG, CHOLHDL, LDLDIRECT in the last 72 hours. Thyroid Function Tests: No results for input(s): TSH, T4TOTAL, FREET4, T3FREE, THYROIDAB in the last 72 hours. Anemia Panel: No results for input(s): VITAMINB12, FOLATE, FERRITIN, TIBC, IRON, RETICCTPCT in the last 72 hours. Urine  analysis:    Component Value Date/Time   COLORURINE YELLOW 07/21/2016 1937   APPEARANCEUR CLEAR 07/21/2016 1937   LABSPEC 1.019 07/21/2016 1937   PHURINE 6.0 07/21/2016 1937   GLUCOSEU NEGATIVE 07/21/2016 1937   HGBUR NEGATIVE 07/21/2016 1937   HGBUR negative 07/31/2009 [place] 07/21/2016 1937   KETONESUR NEGATIVE 07/21/2016 1937   PROTEINUR NEGATIVE 07/21/2016 1937   UROBILINOGEN 0.2 09/11/2014 1951   NITRITE NEGATIVE 07/21/2016 1937   LEUKOCYTESUR NEGATIVE 07/21/2016 1937   No results found for this or any previous visit (from the past 240 hour(s)).    Radiology Studies: Dg Chest Portable 1 View  Result Date: 07/21/2016 CLINICAL DATA:  Cough. EXAM: PORTABLE CHEST 1 VIEW COMPARISON:  Radiograph 05/12/2016, CT 05/09/2016 FINDINGS: Improved right lung aeration with minimal residual ill-defined suprahilar and right basilar patchy opacity. Stable cardiomegaly and mediastinal contours. Mild interstitial and bronchial thickening. No pleural fluid or pneumothorax. Unchanged osseous structures. IMPRESSION: Improved right lung aeration from exam 3 months prior with residual or recurrent right basilar and suprahilar patchy opacities, atelectasis/scarring versus recurrent infection. Electronically Signed   By: Jeb Levering M.D.   On: 07/21/2016 22:33    Scheduled Meds: . benztropine  0.5 mg Oral  Daily  . carbamazepine  200 mg Oral TID  . escitalopram  10 mg Oral Daily  . hydrocortisone  10 mg Oral Q breakfast  . hydrocortisone  5 mg Oral Q supper  . lamoTRIgine  100 mg Oral BID  . midodrine  10 mg Oral TID WC  . pantoprazole  40 mg Oral BID   Continuous Infusions:   LOS: 0 days   Time spent: 25 minutes.  Vance Gather, MD [facility] Pager [phone]  If 7PM-7AM, please contact night-coverage [email or web address] Password [email or web address] 07/22/2016, 1:44 PM

## 2016-07-22 NOTE — Consult Note (Signed)
Referring Provider: Dr. Bonner Puna Primary Care Physician:  [name], MD Primary Gastroenterologist:  Dr. Michail Sermon  Reason for Consultation:  Anemia  HPI: [name] is a 51 y.o. male with traumatic brain injury and spastic paraplegia a chronic history of anemia who was hospitalized 2 months ago for pneumonia and had a discharge Hgb 8.8. He had an admit Hgb 6 without any overt bleeding. Hgb 8.2 after 2 U PRBCs. Hemoccult negative. Colonoscopy in 2013 showed small internal hemorrhoids. EGD in 2013 showed erosive esophagitis and a hiatal hernia. He is unable to give me any history. No family at bedside.   Past Medical History:  Diagnosis Date  . Anxiety   . Bipolar disorder ([place])   . BPH (benign prostatic hyperplasia)   . Closed head injury   . Constipation   . Depression   . Esophageal ulcer   . Malnutrition ([place])   . 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 Peptic ulcer disease   . Seizures ([place])   . Smoker     Past Surgical History:  Procedure Laterality Date  . ESOPHAGOGASTRODUODENOSCOPY  06/17/2011   Procedure: ESOPHAGOGASTRODUODENOSCOPY (EGD);  Surgeon: Wonda Horner, MD;  Location: [facility];  Service: Endoscopy;  Laterality: N/A;  . GASTRECTOMY      Prior to Admission medications   Medication Sig Start Date End Date Taking? Authorizing Provider  benztropine (COGENTIN) 0.5 MG tablet Take 0.5 mg by mouth every morning.    Yes Historical Provider, MD  carbamazepine (TEGRETOL) 200 MG tablet Take 1 tablet (200 mg total) by mouth 3 (three) times daily. 05/17/16  Yes Eber Jones, MD  escitalopram (LEXAPRO) 10 MG tablet Take 10 mg by mouth every morning.    Yes Historical Provider, MD  hydrocortisone (CORTEF) 5 MG tablet Take 1 tablet (5 mg total) by mouth daily at 12 noon. Patient taking differently: Take 5-10 mg by mouth 2 (two) times daily. Take 10 mg every morning and 5 mg every  evening 05/18/16  Yes Eber Jones, MD  lamoTRIgine (LAMICTAL) 100 MG tablet Take 1 tablet (100 mg total) by mouth 2 (two) times daily. 05/17/16  Yes Eber Jones, MD  midodrine (PROAMATINE) 10 MG tablet Take 1 tablet (10 mg total) by mouth 3 (three) times daily with meals. 05/17/16  Yes Eber Jones, MD  pantoprazole (PROTONIX) 40 MG tablet Take 40 mg by mouth every morning.    Yes Historical Provider, MD  docusate sodium (COLACE) 100 MG capsule Take 1 capsule (100 mg total) by mouth 2 (two) times daily. Patient not taking: Reported on 07/21/2016 05/17/16   Eber Jones, MD  famotidine (PEPCID) 20 MG tablet Take 1 tablet (20 mg total) by mouth 2 (two) times daily. Patient not taking: Reported on 07/21/2016 05/17/16   Eber Jones, MD  hydrocortisone (CORTEF) 10 MG tablet Take 1 tablet (10 mg total) by mouth daily with breakfast. Please give at 7am every morning Patient not taking: Reported on 07/21/2016 05/18/16   Eber Jones, MD  senna-docusate (SENOKOT-S) 8.6-50 MG tablet Take 1 tablet by mouth at bedtime as needed for mild constipation. Patient not taking: Reported on 07/21/2016 05/17/16   Eber Jones, MD    Scheduled Meds: . benztropine  0.5 mg Oral Daily  . carbamazepine  200 mg Oral TID  . escitalopram  10 mg Oral Daily  . hydrocortisone  10 mg Oral Q breakfast  .  hydrocortisone  5 mg Oral Q supper  . lamoTRIgine  100 mg Oral BID  . midodrine  10 mg Oral TID WC  . pantoprazole  40 mg Oral BID   Continuous Infusions: PRN Meds:.acetaminophen **OR** acetaminophen, albuterol, ondansetron **OR** ondansetron (ZOFRAN) IV  Allergies as of 07/21/2016 - Review Complete 07/21/2016  Allergen Reaction Noted  . 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 Family history unknown: Yes    Social History   Social History  . Marital status: Single    Spouse name: N/A  . Number of children: N/A   . Years of education: N/A   Occupational History  . Not on file.   Social History Main Topics  . Smoking status: Former Smoker    Packs/day: 0.50    Quit date: 05/26/2010  . Smokeless tobacco: Former Systems developer    Quit date: 06/08/2001  . Alcohol use No  . Drug use: No  . Sexual activity: No   Other Topics Concern  . Not on file   Social History Narrative   Patient lives at home alone w/ caretakers.   Patient does not drink caffeine.   Patient is right handed.     Review of Systems: All negative except as stated above in HPI.  Physical Exam: Vital signs: Vitals:   07/22/16 0510 07/22/16 0645  BP: 124/65 118/60  Pulse: 88 94  Resp: 20 18  Temp: 98.5 F (36.9 C) 98.6 F (37 C)   Last BM Date: 07/21/16 General: Lethargic, thin, no acute distress, contractures HEENT: anicteric sclera Neck: supple, nontender Lungs:  Clear throughout to auscultation.   No wheezes, crackles, or rhonchi. No acute distress. Heart:  Regular rate and rhythm; no murmurs, clicks, rubs,  or gallops. Abdomen: soft, nontender, nondistended, +BS  Rectal:  Deferred Skin: no rash   GI:  Lab Results:  Recent Labs  07/21/16 1940 07/22/16 0848  WBC 6.8 5.7  HGB 6.0* 8.2*  HCT 22.4* 27.9*  PLT 580* 468*   BMET  Recent Labs  07/21/16 1940 07/22/16 0848  NA 140 138  K 3.9 3.6  CL 107 104  CO2 28 26  GLUCOSE 98 113*  BUN 13 8  CREATININE 0.75 0.94  CALCIUM 8.3* 7.9*   LFT No results for input(s): PROT, ALBUMIN, AST, ALT, ALKPHOS, BILITOT, BILIDIR, IBILI in the last 72 hours. PT/INR No results for input(s): LABPROT, INR in the last 72 hours.   Studies/Results: Dg Chest Portable 1 View  Result Date: 07/21/2016 CLINICAL DATA:  Cough. EXAM: PORTABLE CHEST 1 VIEW COMPARISON:  Radiograph 05/12/2016, CT 05/09/2016 FINDINGS: Improved right lung aeration with minimal residual ill-defined suprahilar and right basilar patchy opacity. Stable cardiomegaly and mediastinal contours. Mild  interstitial and bronchial thickening. No pleural fluid or pneumothorax. Unchanged osseous structures. IMPRESSION: Improved right lung aeration from exam 3 months prior with residual or recurrent right basilar and suprahilar patchy opacities, atelectasis/scarring versus recurrent infection. Electronically Signed   By: Jeb Levering M.D.   On: 07/21/2016 22:33    Impression/Plan: Acute on chronic anemia without any overt bleeding. Would manage conservatively and not do any invasive GI procedures. Clear liquid diet. If Hgb stable tomorrow then ok to advance. Will follow.    LOS: 0 days   [place] C.  07/22/2016, 3:21 PM  Pager [phone]  If no answer or after 5 PM call [phone]

## 2016-07-22 NOTE — Progress Notes (Signed)
MD coverage paged due to Temperature of 102 and tachycardia.

## 2016-07-22 NOTE — ED Notes (Signed)
Pt awaiting nurse on 5th floor to arrive before he can be transported.

## 2016-07-23 DIAGNOSIS — D62 Acute posthemorrhagic anemia: Secondary | ICD-10-CM

## 2016-07-23 LAB — URINALYSIS, ROUTINE W REFLEX MICROSCOPIC
BILIRUBIN URINE: NEGATIVE
Glucose, UA: NEGATIVE mg/dL
Hgb urine dipstick: NEGATIVE
KETONES UR: NEGATIVE mg/dL
LEUKOCYTES UA: NEGATIVE
Nitrite: NEGATIVE
PROTEIN: NEGATIVE mg/dL
Specific Gravity, Urine: 1.004 — ABNORMAL LOW (ref 1.005–1.030)
pH: 7 (ref 5.0–8.0)

## 2016-07-23 LAB — TYPE AND SCREEN
ABO/RH(D): O POS
Antibody Screen: NEGATIVE
Unit division: 0
Unit division: 0

## 2016-07-23 LAB — BPAM RBC
Blood Product Expiration Date: 201803272359
Blood Product Expiration Date: 201803272359
ISSUE DATE / TIME: 201802262326
ISSUE DATE / TIME: 201802270258
UNIT TYPE AND RH: 5100
Unit Type and Rh: 5100

## 2016-07-23 LAB — CBC
HCT: 28.1 % — ABNORMAL LOW (ref 39.0–52.0)
Hemoglobin: 8.1 g/dL — ABNORMAL LOW (ref 13.0–17.0)
MCH: 21.1 pg — AB (ref 26.0–34.0)
MCHC: 28.8 g/dL — ABNORMAL LOW (ref 30.0–36.0)
MCV: 73.4 fL — ABNORMAL LOW (ref 78.0–100.0)
PLATELETS: 448 10*3/uL — AB (ref 150–400)
RBC: 3.83 MIL/uL — AB (ref 4.22–5.81)
RDW: 20 % — AB (ref 11.5–15.5)
WBC: 4.5 10*3/uL (ref 4.0–10.5)

## 2016-07-23 LAB — LACTIC ACID, PLASMA: LACTIC ACID, VENOUS: 0.6 mmol/L (ref 0.5–1.9)

## 2016-07-23 MED ORDER — FERROUS SULFATE 325 (65 FE) MG PO TABS
325.0000 mg | ORAL_TABLET | Freq: Two times a day (BID) | ORAL | Status: DC
  Administered 2016-07-23 – 2016-07-26 (×6): 325 mg via ORAL
  Filled 2016-07-23 (×6): qty 1

## 2016-07-23 MED ORDER — SODIUM CHLORIDE 0.9 % IV SOLN
INTRAVENOUS | Status: DC
  Administered 2016-07-23 – 2016-07-24 (×3): via INTRAVENOUS

## 2016-07-23 MED ORDER — CLONAZEPAM 0.5 MG PO TABS
0.5000 mg | ORAL_TABLET | Freq: Every day | ORAL | Status: DC
  Administered 2016-07-23 – 2016-07-25 (×3): 0.5 mg via ORAL
  Filled 2016-07-23 (×3): qty 1

## 2016-07-23 MED ORDER — SODIUM CHLORIDE 0.9 % IV SOLN
3.0000 g | Freq: Four times a day (QID) | INTRAVENOUS | Status: DC
  Administered 2016-07-23 – 2016-07-26 (×12): 3 g via INTRAVENOUS
  Filled 2016-07-23 (×12): qty 3

## 2016-07-23 MED ORDER — FOOD THICKENER (SIMPLYTHICK)
1.0000 | Freq: Three times a day (TID) | ORAL | Status: DC
  Filled 2016-07-23: qty 1

## 2016-07-23 MED ORDER — ENSURE ENLIVE PO LIQD
237.0000 mL | Freq: Two times a day (BID) | ORAL | Status: DC
  Administered 2016-07-23 – 2016-07-26 (×6): 237 mL via ORAL

## 2016-07-23 MED ORDER — FOOD THICKENER (SIMPLYTHICK): 1.0000 | Freq: Three times a day (TID) | ORAL | Status: DC

## 2016-07-23 MED ORDER — RISPERIDONE 1 MG PO TABS
1.0000 mg | ORAL_TABLET | Freq: Two times a day (BID) | ORAL | Status: DC
  Administered 2016-07-23 – 2016-07-25 (×5): 1 mg via ORAL
  Filled 2016-07-23 (×4): qty 1

## 2016-07-23 MED ORDER — RESOURCE THICKENUP CLEAR PO POWD
Freq: Three times a day (TID) | ORAL | Status: DC
  Administered 2016-07-23 – 2016-07-25 (×9): via ORAL
  Administered 2016-07-26 (×2): 1 via ORAL
  Filled 2016-07-23 (×2): qty 125

## 2016-07-23 MED ORDER — ACETAMINOPHEN 325 MG PO TABS
650.0000 mg | ORAL_TABLET | Freq: Once | ORAL | Status: AC
  Administered 2016-07-23: 650 mg via ORAL
  Filled 2016-07-23: qty 2

## 2016-07-23 NOTE — Progress Notes (Signed)
Initial Nutrition Assessment  DOCUMENTATION CODES:   Not applicable  INTERVENTION:   Ensure Enlive po BID, each supplement provides 350 kcal and 20 grams of protein  Magic cup TID with meals, each supplement provides 290 kcal and 9 grams of protein  NUTRITION DIAGNOSIS:   Inadequate oral intake related to poor appetite, acute illness as evidenced by per patient/family report, meal completion < 50%.  GOAL:   Patient will meet greater than or equal to 90% of their needs  MONITOR:   PO intake, Supplement acceptance, Labs, Weight trends  REASON FOR ASSESSMENT:   Malnutrition Screening Tool    ASSESSMENT:   51 y.o. male with traumatic brain injury and spastic paraplegia a chronic history of anemia who was hospitalized 2 months ago for pneumonia and had a discharge Hgb 8.8. He had an admit Hgb 6 without any overt bleeding. Hgb 8.2 after 2 U PRBCs. Hemoccult negative. Colonoscopy in 2013 showed small internal hemorrhoids. EGD in 2013 showed erosive esophagitis and a hiatal hernia. Pt with spastic quadriplegia, chronic anemia, and s/p gasterctomy; who presents with abnormal lab work.    Met with pt in room today. Pt reports poor appetite today and reports that his has been drinking a lot of water. Pt is poor historian and difficult to communicate with r/t history of TBI. Pt is currently eating <50% of his clear liquid diet. Pt advanced to full liquid diet today. RD will order Ensure and Magic Cups to try and encourage intake. Per chart, pt with weight gain likely r/t fluid as pt with moderate edema. Pt with history of gastrectomy; RD is unsure why pt had this surgery.    Medications reviewed and include: protonix  Labs reviewed: Ca 7.8(L) adj. 8.92 wnl, Alb 2.6(L) Hgb 8.1(L), Hct 28.1(L)  Nutrition-Focused physical exam completed. Findings are no fat depletion, no muscle depletion, and moderate edema.   Diet Order:  Diet full liquid Room service appropriate? Yes; Fluid consistency:  Thin  Skin:  Reviewed, no issues  Last BM:  2/27  Height:   Ht Readings from Last 1 Encounters:  07/22/16 6' 3"  (1.905 m)    Weight:   Wt Readings from Last 1 Encounters:  07/22/16 180 lb (81.6 kg)    Ideal Body Weight:  80.1 kg (adjusted for quadraplegia )  BMI:  Body mass index is 22.5 kg/m.  Estimated Nutritional Needs:   Kcal:  2050-2350kcal/day   Protein:  98-115g/day   Fluid:  >2L/day   EDUCATION NEEDS:   Education needs no appropriate at this time  Koleen Distance, RD, [place] Pager #[phone]

## 2016-07-23 NOTE — Progress Notes (Signed)
Pharmacy Antibiotic Note  [name] is a 51 y.o. male admitted on 07/21/2016 with symptomatic anemia. Patient also with fever; MD suspects aspiration pneumonia as source. Pharmacy has been consulted for Unasyn dosing.  Plan:  Unasyn 3g IV q6h  Given normal SCr and no history of renal insufficiency noted, do not anticipate need for further dose adjustment. Therefore, pharmacy will sign off at this time. Please re-consult if a change in clinical status warrants re-evaluation of dosage.   Height: 6\' 3"  (190.5 cm) (per pt) Weight: 180 lb (81.6 kg) (per pt) IBW/kg (Calculated) : 84.5  Temp (24hrs), Avg:100.6 F (38.1 C), Min:99 F (37.2 C), Max:102.1 F (38.9 C)   Recent Labs Lab 07/21/16 1940 07/22/16 0848 07/22/16 1711 07/22/16 2229 07/22/16 2246 07/23/16 0120 07/23/16 0508  WBC 6.8 5.7 4.0 4.9  --   --  4.5  CREATININE 0.75 0.94  --  0.93  --   --   --   LATICACIDVEN  --   --   --   --  0.8 0.6  --     Estimated Creatinine Clearance: 110.9 mL/min (by C-G formula based on SCr of 0.93 mg/dL).    Allergies  Allergen Reactions  . Pineapple Anaphylaxis    Antimicrobials this admission: 2/28 >> Unasyn >>  Dose adjustments this admission: --  Microbiology results: 2/27 BCx: sent 2/27 MRSA PCR: negative  Thank you for allowing pharmacy to be a part of this patient's care.   Lindell Spar, PharmD, BCPS Pager: [phone] 07/23/2016 3:23 PM

## 2016-07-23 NOTE — Progress Notes (Signed)
PROGRESS NOTE    [name]  [id number] DOB: 05-31-1965 DOA: 07/21/2016 PCP: [name], MD   Brief Narrative: [name] a 51 y.o.malewith a history of TBI, MR, spastic paraplegia, chronic anemia, and s/p gasterctomy who presented with abnormal lab work. [name] had recent blood work showing an abnormally low hemoglobin, so [name] was sent to the ED for evaluation. [name] has complained of some abdominal pain sometime last week improved with pepto-bismol, but otherwise denies any symptoms. [name] has chronic constipation and denies noticing any GI bleeding, recent abdominal pain, N/V/D. No dyspnea, chest pain or palpitations. [name] had been hospitalized 2 months prior with acute respiratory failure due to aspiration pneumonia requiring intubation. Hemoglobin progressively dropped during that admission and [name] required 1u PRBCs. No work up was performed at that time, discharge hemoglobin was 8.8. In 2013 EGD showed erosive esophagitis and hiatal hernia, colonoscopy showed only internal hemorrhoids.   On arrival to the ED, the patient appeared afebrile, pulse 96-103, respirations 17-22, and all other vitals within normal limits. Lab work revealed hemoglobin 6, platelets 580, and all other labs within normal limits. Chest x-ray showed right suprahilar and basilar opacities suspicious for atelectasis versus recurrent pneumonia. Patient was ordered to be transfused 2 units of PRBCs with improvement in hemoglobin to 8.2. GI was consulted for acute anemia and concern for GI bleeding.    Assessment & Plan:   Principal Problem:   Acute blood loss anemia Active Problems:   GERD with esophagitis   Spastic quadriparesis (HCC)   Adrenal insufficiency (Addison's disease) (HCC)   GI bleed   Thrombocytosis (HCC)   Ulcerative esophagitis  Acute Anemia;  Hb on admission at 6. After Blood transfusion it has increased to 8.  Advance diet,  GI is not planning further procedure.  Patient has received 2 units of  PRBC>  Start iron.   Fever;  Unclear etiology, suspect aspiration.  Will start Unasyn.  Speech to do swallow evaluation.  Follow blood culture.  No history of diarrhea, no worsening cough.  UA; negative for infection.    History of traumatic brain injury: w/ cognitive deficits and spastic quadriparesis: Chronic, stable - Continue home medications: carbamazepine, lamictal, lexapro, cogentin -per caregiver patient is on Risperdal; 1 mg BID. And klonopin at bedtime. I have order this medications.   Adrenal insufficiency: Chronic, stable - Continue hydrocortisone, midodrine   DVT prophylaxis: SCD.  Code Status:  Full code.  Family Communication: none at bedside.  Disposition Plan: home in 24 to 48 hours if afebrile.   Consultants:   GI   Procedures: none   Antimicrobials:   Unasyn 2-28   Subjective: [name] is feeling well, denies worsening cough. [name] wants to eat, no diarrhea.   Objective: Vitals:   07/22/16 0645 07/22/16 1500 07/22/16 2156 07/23/16 0507  BP: 118/60 (!) 96/52 (!) 104/56 127/68  Pulse: 94 99 (!) 107 (!) 105  Resp: 18 20 18 20   Temp: 98.6 F (37 C) 100.3 F (37.9 C) (!) 102.1 F (38.9 C) 99 F (37.2 C)  TempSrc: Oral Oral Oral Oral  SpO2: 99% 98% 95% 97%  Weight:      Height:        Intake/Output Summary (Last 24 hours) at 07/23/16 1117 Last data filed at 07/23/16 1022  Gross per 24 hour  Intake             2640 ml  Output             4825  ml  Net            -2185 ml   Filed Weights   07/21/16 2150 07/22/16 0210  Weight: 81.6 kg (180 lb) 81.6 kg (180 lb)    Examination:  General exam: Appears calm and comfortable  Respiratory system: Clear to auscultation. Respiratory effort normal. Cardiovascular system: S1 & S2 heard, RRR. No JVD, murmurs, rubs, gallops or clicks. No pedal edema. Gastrointestinal system: Abdomen is nondistended, soft and nontender. No organomegaly or masses felt. Normal bowel sounds heard. Central nervous system:  Alert and oriented.  Extremities: Symmetric 5 x 5 power. Skin: No rashes, lesions or ulcers     Data Reviewed: I have personally reviewed following labs and imaging studies  CBC:  Recent Labs Lab 07/21/16 1940 07/22/16 0848 07/22/16 1711 07/22/16 2229 07/23/16 0508  WBC 6.8 5.7 4.0 4.9 4.5  NEUTROABS  --   --   --  2.9  --   HGB 6.0* 8.2* 8.1* 8.0* 8.1*  HCT 22.4* 27.9* 28.0* 27.1* 28.1*  MCV 71.8* 73.6* 72.2* 72.7* 73.4*  PLT 580* 468* 456* 438* [id number]*   Basic Metabolic Panel:  Recent Labs Lab 07/21/16 1940 07/22/16 0848 07/22/16 2229  NA 140 138 136  K 3.9 3.6 3.9  CL 107 104 105  CO2 28 26 26   GLUCOSE 98 113* 86  BUN 13 8 8   CREATININE 0.75 0.94 0.93  CALCIUM 8.3* 7.9* 7.8*   GFR: Estimated Creatinine Clearance: 110.9 mL/min (by C-G formula based on SCr of 0.93 mg/dL). Liver Function Tests:  Recent Labs Lab 07/22/16 2229  AST 17  ALT 10*  ALKPHOS 98  BILITOT 0.4  PROT 6.5  ALBUMIN 2.6*   No results for input(s): LIPASE, AMYLASE in the last 168 hours. No results for input(s): AMMONIA in the last 168 hours. Coagulation Profile: No results for input(s): INR, PROTIME in the last 168 hours. Cardiac Enzymes: No results for input(s): CKTOTAL, CKMB, CKMBINDEX, TROPONINI in the last 168 hours. BNP (last 3 results) No results for input(s): PROBNP in the last 8760 hours. HbA1C: No results for input(s): HGBA1C in the last 72 hours. CBG:  Recent Labs Lab 07/21/16 2117  GLUCAP 95   Lipid Profile: No results for input(s): CHOL, HDL, LDLCALC, TRIG, CHOLHDL, LDLDIRECT in the last 72 hours. Thyroid Function Tests: No results for input(s): TSH, T4TOTAL, FREET4, T3FREE, THYROIDAB in the last 72 hours. Anemia Panel: No results for input(s): VITAMINB12, FOLATE, FERRITIN, TIBC, IRON, RETICCTPCT in the last 72 hours. Sepsis Labs:  Recent Labs Lab 07/22/16 2246 07/23/16 0120  LATICACIDVEN 0.8 0.6    Recent Results (from the past 240 hour(s))  MRSA PCR  Screening     Status: None   Collection Time: 07/22/16 12:40 PM  Result Value Ref Range Status   MRSA by PCR NEGATIVE NEGATIVE Final    Comment:        The GeneXpert MRSA Assay (FDA approved for NASAL specimens only), is one component of a comprehensive MRSA colonization surveillance program. It is not intended to diagnose MRSA infection nor to guide or monitor treatment for MRSA infections.          Radiology Studies: Dg Chest Port 1 View  Result Date: 07/22/2016 CLINICAL DATA:  Fever and tachycardia EXAM: PORTABLE CHEST 1 VIEW COMPARISON:  07/21/2016 FINDINGS: Heart is borderline enlarged but stable. No aortic aneurysm. Minimal atelectasis at the right lung base. No pulmonary consolidations. No pneumothorax or pulmonary edema. Old healed fracture deformity of the right humeral  shaft. Left seventh and eighth chronic rib fractures. IMPRESSION: No active disease. Electronically Signed   By: Ashley Royalty M.D.   On: 07/22/2016 23:11   Dg Chest Portable 1 View  Result Date: 07/21/2016 CLINICAL DATA:  Cough. EXAM: PORTABLE CHEST 1 VIEW COMPARISON:  Radiograph 05/12/2016, CT 05/09/2016 FINDINGS: Improved right lung aeration with minimal residual ill-defined suprahilar and right basilar patchy opacity. Stable cardiomegaly and mediastinal contours. Mild interstitial and bronchial thickening. No pleural fluid or pneumothorax. Unchanged osseous structures. IMPRESSION: Improved right lung aeration from exam 3 months prior with residual or recurrent right basilar and suprahilar patchy opacities, atelectasis/scarring versus recurrent infection. Electronically Signed   By: Jeb Levering M.D.   On: 07/21/2016 22:33        Scheduled Meds: . benztropine  0.5 mg Oral Daily  . carbamazepine  200 mg Oral TID  . escitalopram  10 mg Oral Daily  . hydrocortisone  10 mg Oral Q breakfast  . hydrocortisone  5 mg Oral Q supper  . lamoTRIgine  100 mg Oral BID  . midodrine  10 mg Oral TID WC  .  pantoprazole  40 mg Oral BID   Continuous Infusions: . sodium chloride 75 mL/hr at 07/23/16 0648     LOS: 1 day    Time spent: 35 minutes.     Elmarie Shiley, MD [facility] Pager [phone]  If 7PM-7AM, please contact night-coverage [email or web address] Password [facility] 07/23/2016, 11:17 AM

## 2016-07-23 NOTE — Progress Notes (Signed)
[facility]  Patient Status: Active  [facility] is providing the following services: SN, PT, OT  If patient discharges after hours, please call [phone].   [name] 07/23/2016, 1:07 PM

## 2016-07-23 NOTE — Evaluation (Signed)
Clinical/Bedside Swallow Evaluation Patient Details  Name: [name] MRN: [id number] Date of Birth: 1965-09-14  Today's Date: 07/23/2016 Time: SLP Start Time (ACUTE ONLY): 1335 SLP Stop Time (ACUTE ONLY): 1405 SLP Time Calculation (min) (ACUTE ONLY): 30 min  Past Medical History:  Past Medical History:  Diagnosis Date  . Anxiety   . Bipolar disorder ([place])   . BPH (benign prostatic hyperplasia)   . Closed head injury   . Constipation   . Depression   . Esophageal ulcer   . Malnutrition ([place])   . 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 Peptic ulcer disease   . Seizures ([place])   . Smoker    Past Surgical History:  Past Surgical History:  Procedure Laterality Date  . ESOPHAGOGASTRODUODENOSCOPY  06/17/2011   Procedure: ESOPHAGOGASTRODUODENOSCOPY (EGD);  Surgeon: Wonda Horner, MD;  Location: [facility];  Service: Endoscopy;  Laterality: N/A;  . GASTRECTOMY     HPI:  51 year old male admitted 07/21/16 due to abnormal labs. PMH significant for TBI, mild mental retardation, spastic quadriplegia, chronic anemia, gastrectomy, previous hospitalization for acute respiratory failure requiring intubation, aspiration PNA, BiPolar, anxiety, BPH, esophageal ulcer, malnutrition, seizures, GERD, multiple MBS and BSE completed. CXR indicates no active disease.    Assessment / Plan / Recommendation Clinical Impression  Pt presents with general oral motor weakness, unchanged from baseline. Pt was given boluses of nectar thick liquid, puree, and solid. Thin liquids were not given, due to history of silent aspiration of thin liquids on MBS 06/05/16. Pt also with history of silent aspiration of nectar per MBS, however, chin tuck and straw use were recommended following MBS last month. Pt is currently hospitalized due to abnormal labs, not PNA. Caregiver from pt's facility indicates pt was tolerating mechanical soft diet and  nectar thick liquids well prior to admit, and pt/staff are aware of pt high aspiration risk. Repeat MBS is not recommended at this time.   Diet changed to mech soft with chopped meats, extra gravy, nectar thick liquids, crushed meds. Safe swallow precautions were reviewed with pt/caregiver and posted at [facility]. RN informed of results and recommendations. ST will follow for assessment of diet tolerance and education.    SLP Visit Diagnosis: Dysphagia, oropharyngeal phase (R13.12)    Aspiration Risk  Moderate aspiration risk    Diet Recommendation Dysphagia 3 (Mech soft);Nectar-thick liquid (chop meats, extra gravy to moisten)   Liquid Administration via: Straw with chin tuck Medication Administration: Crushed with puree Supervision: Patient able to self feed;Full supervision/cueing for compensatory strategies;Staff to assist with self feeding Compensations: Small sips/bites;Minimize environmental distractions;Slow rate;Chin tuck;Use straw to facilitate chin tuck Postural Changes: Seated upright at 90 degrees;Remain upright for at least 30 minutes after po intake    Other  Recommendations Oral Care Recommendations: Oral care BID;Staff/trained caregiver to provide oral care Other Recommendations: Remove water pitcher;Order thickener from pharmacy   Follow up Recommendations  (TBD)      Frequency and Duration min 1 x/week  1 week       Prognosis Prognosis for Safe Diet Advancement: Guarded Barriers to Reach Goals: Cognitive deficits;Time post onset      Swallow Study   General Date of Onset: 07/21/16 HPI: 51 year old male admitted 07/21/16 due to abnormal labs. PMH significant for TBI, mild mental retardation, spastic quadriplegia, chronic anemia, gastrectomy, previous hospitalization for acute respiratory failure requiring intubation, aspiration PNA, BiPolar, anxiety, BPH, esophageal ulcer,  malnutrition, seizures, GERD, multiple MBS and BSE completed. CXR indicates no active disease.   Type of Study: Bedside Swallow Evaluation Previous Swallow Assessment: most recent MBS 06/05/16 = Dys 3 with nectar thick liquids via straw with chin tuck, crushed meds. Diet Prior to this Study:  (full liquid) Temperature Spikes Noted: Yes Respiratory Status: Room air History of Recent Intubation: No Behavior/Cognition: Alert;Cooperative;Pleasant mood Oral Cavity Assessment: Within Functional Limits Oral Care Completed by SLP: No Oral Cavity - Dentition: Adequate natural dentition Vision: Functional for self-feeding Self-Feeding Abilities: Able to feed self;Needs assist Patient Positioning: Upright in bed Baseline Vocal Quality: Normal Volitional Cough: Strong Volitional Swallow: Able to elicit    Oral/Motor/Sensory Function Overall Oral Motor/Sensory Function: Mild impairment (general weakness)   Ice Chips Ice chips: Not tested   Thin Liquid Thin Liquid: Not tested Other Comments: history of silent aspiration of thin liquids    Nectar Thick Nectar Thick Liquid: Within functional limits Presentation: Spoon;Straw Other Comments: history of silent aspiration. chin tuck with straw recommended last MBS   Honey Thick Honey Thick Liquid: Not tested   Puree Puree: Within functional limits Presentation: Spoon   Solid   GO   Solid: Impaired Oral Phase Impairments:  (slow bolus prep) Pharyngeal Phase Impairments: Suspected delayed Swallow (dry cracker) Other Comments: Pt is impulsive when self-feeding       Celia B. Quentin Ore [facility], CCC-SLP [id number]  Shonna Chock 07/23/2016,2:48 PM

## 2016-07-24 LAB — COMPREHENSIVE METABOLIC PANEL
ALT: 11 U/L — AB (ref 17–63)
ANION GAP: 6 (ref 5–15)
AST: 20 U/L (ref 15–41)
Albumin: 2.3 g/dL — ABNORMAL LOW (ref 3.5–5.0)
Alkaline Phosphatase: 88 U/L (ref 38–126)
BUN: 11 mg/dL (ref 6–20)
CALCIUM: 8 mg/dL — AB (ref 8.9–10.3)
CO2: 27 mmol/L (ref 22–32)
CREATININE: 1.06 mg/dL (ref 0.61–1.24)
Chloride: 107 mmol/L (ref 101–111)
Glucose, Bld: 92 mg/dL (ref 65–99)
Potassium: 4 mmol/L (ref 3.5–5.1)
Sodium: 140 mmol/L (ref 135–145)
Total Bilirubin: 0.6 mg/dL (ref 0.3–1.2)
Total Protein: 6.4 g/dL — ABNORMAL LOW (ref 6.5–8.1)

## 2016-07-24 LAB — CBC
HCT: 27.8 % — ABNORMAL LOW (ref 39.0–52.0)
Hemoglobin: 7.9 g/dL — ABNORMAL LOW (ref 13.0–17.0)
MCH: 21 pg — AB (ref 26.0–34.0)
MCHC: 28.4 g/dL — AB (ref 30.0–36.0)
MCV: 73.9 fL — AB (ref 78.0–100.0)
PLATELETS: 394 10*3/uL (ref 150–400)
RBC: 3.76 MIL/uL — ABNORMAL LOW (ref 4.22–5.81)
RDW: 20.2 % — ABNORMAL HIGH (ref 11.5–15.5)
WBC: 5.1 10*3/uL (ref 4.0–10.5)

## 2016-07-24 NOTE — Clinical Social Work Note (Signed)
Clinical Social Work Assessment  Patient Details  Name: [name] MRN: [id number] Date of Birth: Nov 20, 1965  Date of referral:  07/24/16               Reason for consult:  Facility Placement ([place])                Permission sought to share information with:  Facility Sport and exercise psychologist Permission granted to share information::     Name::        Agency::  [facility]  Relationship::  Caretakers  Contact Information:  [phone]  Housing/Transportation Living arrangements for the past 2 months:  Group Home Source of Information:  Patient Patient Interpreter Needed:  None Criminal Activity/Legal Involvement Pertinent to Current Situation/Hospitalization:  No - Comment as needed Significant Relationships:  Warehouse manager Lives with:  Facility Resident Do you feel safe going back to the place where you live?  Yes Need for family participation in patient care:  Yes (Comment)  Care giving concerns:  No concerns.    Social Worker assessment / plan:  Plan: Patient to return to [place]. LCSWA spoke with medical coordinator -[name] at facility. She reports the patient has been living at [facility] since 1999. Patient can feed self, stand, ambulate,and  transfer on his own. The patient does need help with  ADL's.  PT consult is pending.  LCSWA will assist with disposition as needed.   Employment status:  Disabled (Comment on whether or not currently receiving Disability) Insurance information:  Medicare PT Recommendations:  Not assessed at this time Information / Referral to community resources:     Patient/Family's Response to care:  Agreeable.   Patient/Family's Understanding of and Emotional Response to Diagnosis, Current Treatment, and Prognosis:   Emotional Assessment Appearance:  Developmentally appropriate Attitude/Demeanor/Rapport:    Affect (typically observed):    Orientation:  Oriented to Self, Oriented to Place, Oriented to  Situation Alcohol / Substance use:  Not Applicable Psych involvement (Current and /or in the community):  No (Comment)  Discharge Needs  Concerns to be addressed:  Discharge Planning Concerns Readmission within the last 30 days:  No Current discharge risk:  None Barriers to Discharge:  Continued Medical Work up   [employer], LCSW 07/24/2016, 8:51 AM

## 2016-07-24 NOTE — Progress Notes (Signed)
PROGRESS NOTE    [name]  [id number] DOB: 06/07/1965 DOA: 07/21/2016 PCP: [name], MD   Brief Narrative: [name] a 51 y.o.malewith a history of TBI, MR, spastic paraplegia, chronic anemia, and s/p gasterctomy who presented with abnormal lab work. He had recent blood work showing an abnormally low hemoglobin, so he was sent to the ED for evaluation. He has complained of some abdominal pain sometime last week improved with pepto-bismol, but otherwise denies any symptoms. He has chronic constipation and denies noticing any GI bleeding, recent abdominal pain, N/V/D. No dyspnea, chest pain or palpitations. He had been hospitalized 2 months prior with acute respiratory failure due to aspiration pneumonia requiring intubation. Hemoglobin progressively dropped during that admission and he required 1u PRBCs. No work up was performed at that time, discharge hemoglobin was 8.8. In 2013 EGD showed erosive esophagitis and hiatal hernia, colonoscopy showed only internal hemorrhoids.   On arrival to the ED, the patient appeared afebrile, pulse 96-103, respirations 17-22, and all other vitals within normal limits. Lab work revealed hemoglobin 6, platelets 580, and all other labs within normal limits. Chest x-ray showed right suprahilar and basilar opacities suspicious for atelectasis versus recurrent pneumonia. Patient was ordered to be transfused 2 units of PRBCs with improvement in hemoglobin to 8.2. GI was consulted for acute anemia and concern for GI bleeding.    Assessment & Plan:   Principal Problem:   Acute blood loss anemia Active Problems:   GERD with esophagitis   Spastic quadriparesis (HCC)   Adrenal insufficiency (Addison's disease) (HCC)   GI bleed   Thrombocytosis (HCC)   Ulcerative esophagitis  Acute Anemia;  Hb on admission at 6. After Blood transfusion it has increased to 8.  Advance diet,  GI is not planning further procedure.  Patient has received 2 units of  PRBC>  Started  iron.  Hb slightly decreased to 7.9. Repeat labs in am.  Check B 12 and folic acid.   Fever;  Unclear etiology, suspect aspiration.  Continue with Unasyn.  Speech to do swallow evaluation. Diet change to dysphagia diet  Follow blood culture.  No history of diarrhea, no worsening cough.  UA; negative for infection.    History of traumatic brain injury: w/ cognitive deficits and spastic quadriparesis: Chronic, stable - Continue home medications: carbamazepine, lamictal, lexapro, cogentin -per caregiver patient is on Risperdal; 1 mg BID. And klonopin at bedtime. I have order this medications.   Adrenal insufficiency: Chronic, stable - Continue hydrocortisone, midodrine   DVT prophylaxis: SCD.  Code Status:  Full code.  Family Communication: none at bedside.  Disposition Plan: remain inpatient for IV antibiotics and repeat hb in am.   Consultants:   GI   Procedures: none   Antimicrobials:   Unasyn 2-28   Subjective: No bloody BM.  He had fever this morning.   Objective: Vitals:   07/23/16 2029 07/24/16 0525 07/24/16 0822 07/24/16 1400  BP: 110/60 102/68 136/74 102/68  Pulse: 100 100 96 100  Resp: 19 20 20 20   Temp: 99.5 F (37.5 C) 100.1 F (37.8 C) 98.8 F (37.1 C) 98.8 F (37.1 C)  TempSrc: Oral Oral Oral Oral  SpO2: 94% 91% 98% 95%  Weight:      Height:        Intake/Output Summary (Last 24 hours) at 07/24/16 1512 Last data filed at 07/24/16 1400  Gross per 24 hour  Intake          3138.25 ml  Output  3085 ml  Net            53.25 ml   Filed Weights   07/21/16 2150 07/22/16 0210  Weight: 81.6 kg (180 lb) 81.6 kg (180 lb)    Examination:  General exam: Appears calm and comfortable  Respiratory system: Clear to auscultation. Respiratory effort normal. Cardiovascular system: S1 & S2 heard, RRR. No JVD, murmurs, rubs, gallops or clicks. No pedal edema. Gastrointestinal system: Abdomen is nondistended, soft and  nontender. No organomegaly or masses felt. Normal bowel sounds heard. Central nervous system: Alert and oriented.  Extremities: Symmetric 5 x 5 power. Skin: No rashes, lesions or ulcers     Data Reviewed: I have personally reviewed following labs and imaging studies  CBC:  Recent Labs Lab 07/22/16 0848 07/22/16 1711 07/22/16 2229 07/23/16 0508 07/24/16 0831  WBC 5.7 4.0 4.9 4.5 5.1  NEUTROABS  --   --  2.9  --   --   HGB 8.2* 8.1* 8.0* 8.1* 7.9*  HCT 27.9* 28.0* 27.1* 28.1* 27.8*  MCV 73.6* 72.2* 72.7* 73.4* 73.9*  PLT 468* 456* 438* 448* [id number]   Basic Metabolic Panel:  Recent Labs Lab 07/21/16 1940 07/22/16 0848 07/22/16 2229 07/24/16 0831  NA 140 138 136 140  K 3.9 3.6 3.9 4.0  CL 107 104 105 107  CO2 28 26 26 27   GLUCOSE 98 113* 86 92  BUN 13 8 8 11   CREATININE 0.75 0.94 0.93 1.06  CALCIUM 8.3* 7.9* 7.8* 8.0*   GFR: Estimated Creatinine Clearance: 97.3 mL/min (by C-G formula based on SCr of 1.06 mg/dL). Liver Function Tests:  Recent Labs Lab 07/22/16 2229 07/24/16 0831  AST 17 20  ALT 10* 11*  ALKPHOS 98 88  BILITOT 0.4 0.6  PROT 6.5 6.4*  ALBUMIN 2.6* 2.3*   No results for input(s): LIPASE, AMYLASE in the last 168 hours. No results for input(s): AMMONIA in the last 168 hours. Coagulation Profile: No results for input(s): INR, PROTIME in the last 168 hours. Cardiac Enzymes: No results for input(s): CKTOTAL, CKMB, CKMBINDEX, TROPONINI in the last 168 hours. BNP (last 3 results) No results for input(s): PROBNP in the last 8760 hours. HbA1C: No results for input(s): HGBA1C in the last 72 hours. CBG:  Recent Labs Lab 07/21/16 2117  GLUCAP 95   Lipid Profile: No results for input(s): CHOL, HDL, LDLCALC, TRIG, CHOLHDL, LDLDIRECT in the last 72 hours. Thyroid Function Tests: No results for input(s): TSH, T4TOTAL, FREET4, T3FREE, THYROIDAB in the last 72 hours. Anemia Panel: No results for input(s): VITAMINB12, FOLATE, FERRITIN, TIBC, IRON,  RETICCTPCT in the last 72 hours. Sepsis Labs:  Recent Labs Lab 07/22/16 2246 07/23/16 0120  LATICACIDVEN 0.8 0.6    Recent Results (from the past 240 hour(s))  MRSA PCR Screening     Status: None   Collection Time: 07/22/16 12:40 PM  Result Value Ref Range Status   MRSA by PCR NEGATIVE NEGATIVE Final    Comment:        The GeneXpert MRSA Assay (FDA approved for NASAL specimens only), is one component of a comprehensive MRSA colonization surveillance program. It is not intended to diagnose MRSA infection nor to guide or monitor treatment for MRSA infections.   Culture, blood (routine x 2)     Status: None (Preliminary result)   Collection Time: 07/22/16 10:29 PM  Result Value Ref Range Status   Specimen Description BLOOD RIGHT ANTECUBITAL  Final   Special Requests BOTTLES DRAWN AEROBIC AND ANAEROBIC 5.5 CC  EA  Final   Culture   Final    NO GROWTH 1 DAY Performed at [facility], [place] [address], [place], [place] [address]    Report Status PENDING  Incomplete  Culture, blood (routine x 2)     Status: None (Preliminary result)   Collection Time: 07/22/16 10:36 PM  Result Value Ref Range Status   Specimen Description BLOOD LAC  Final   Special Requests BOTTLES DRAWN AEROBIC AND ANAEROBIC 5CC EA  Final   Culture   Final    NO GROWTH 1 DAY Performed at [facility], [place] [address] [place], [place] [address]    Report Status PENDING  Incomplete         Radiology Studies: Dg Chest Port 1 View  Result Date: 07/22/2016 CLINICAL DATA:  Fever and tachycardia EXAM: PORTABLE CHEST 1 VIEW COMPARISON:  07/21/2016 FINDINGS: Heart is borderline enlarged but stable. No aortic aneurysm. Minimal atelectasis at the right lung base. No pulmonary consolidations. No pneumothorax or pulmonary edema. Old healed fracture deformity of the right humeral shaft. Left seventh and eighth chronic rib fractures. IMPRESSION: No active disease. Electronically Signed   By: Ashley Royalty  M.D.   On: 07/22/2016 23:11        Scheduled Meds: . ampicillin-sulbactam (UNASYN) IV  3 g Intravenous Q6H  . benztropine  0.5 mg Oral Daily  . carbamazepine  200 mg Oral TID  . clonazePAM  0.5 mg Oral QHS  . escitalopram  10 mg Oral Daily  . feeding supplement (ENSURE ENLIVE)  237 mL Oral BID BM  . ferrous sulfate  325 mg Oral BID WC  . hydrocortisone  10 mg Oral Q breakfast  . hydrocortisone  5 mg Oral Q supper  . lamoTRIgine  100 mg Oral BID  . midodrine  10 mg Oral TID WC  . pantoprazole  40 mg Oral BID  . RESOURCE THICKENUP CLEAR   Oral TID WC & HS  . risperiDONE  1 mg Oral BID   Continuous Infusions: . sodium chloride 75 mL/hr at 07/24/16 1318     LOS: 2 days    Time spent: 35 minutes.     Elmarie Shiley, MD [facility] Pager [phone]  If 7PM-7AM, please contact night-coverage [email or web address] Password [email or web address] 07/24/2016, 3:12 PM

## 2016-07-24 NOTE — Progress Notes (Signed)
  Speech Language Pathology Treatment: Dysphagia  Patient Details Name: AAKASH HOLLOMON MRN: [id number] DOB: [birth year] Today's Date: 07/24/2016 Time: 4037-0964 SLP Time Calculation (min) (ACUTE ONLY): 15 min  Assessment / Plan / Recommendation Clinical Impression  Pt demonstrates ongoing need for full supervision to follow precautions needed to reduce, but never eliminate, risk of aspiration. Pt will independently tuck chin, but will rapidly consume liquids if the straw is not pulled away. Rapid intake is followed by delayed coughing indicative of probable aspiration. RN reports staff is aware of precautions and are providing appropriate supervision. Diet continues to be appropriate and staff are meeting pts needs. No further SLP interventions needed will sign off.   HPI HPI: 51 year old male admitted 07/21/16 due to abnormal labs. PMH significant for TBI, mild mental retardation, spastic quadriplegia, chronic anemia, gastrectomy, previous hospitalization for acute respiratory failure requiring intubation, aspiration PNA, BiPolar, anxiety, BPH, esophageal ulcer, malnutrition, seizures, GERD, multiple MBS and BSE completed. CXR indicates no active disease.       SLP Plan  All goals met       Recommendations  Diet recommendations: Dysphagia 3 (mechanical soft);Nectar-thick liquid Liquids provided via: Straw Medication Administration: Crushed with puree Supervision: Full supervision/cueing for compensatory strategies Compensations: Slow rate;Small sips/bites;Minimize environmental distractions;Chin tuck;Use straw to facilitate chin tuck Postural Changes and/or Swallow Maneuvers: Seated upright 90 degrees;Upright 30-60 min after meal                Oral Care Recommendations: Oral care BID;Staff/trained caregiver to provide oral care Follow up Recommendations: 24 hour supervision/assistance SLP Visit Diagnosis: Dysphagia, oropharyngeal phase (R13.12) Plan: All goals met       GO                Herbie Baltimore, MA CCC-SLP [phone]  Lynann Beaver 07/24/2016, 1:58 PM

## 2016-07-24 NOTE — Evaluation (Signed)
Physical Therapy Evaluation Patient Details Name: [name] MRN: [id number] DOB: 1966/02/24 Today's Date: 07/24/2016   History of Present Illness  51 y.o. male with PMH of MR (baseline lives at group home), and prior tracheostomy, TBI, spastic paraplegia, chronic anemia and admitted for Acute blood loss anemia  Clinical Impression  Pt admitted with above diagnosis. Pt currently with functional limitations due to the deficits listed below (see PT Problem List).  Pt will benefit from skilled PT to increase their independence and safety with mobility to allow discharge to the venue listed below.   Pt typically able to transfer to his w/c without assist at baseline and had been working on ambulation with HHPT prior to admission.  Pt currently requiring at least mod assist for transfers and may need SNF upon d/c since pt is from a group home.     Follow Up Recommendations SNF    Equipment Recommendations  None recommended by PT    Recommendations for Other Services       Precautions / Restrictions Precautions Precautions: Fall      Mobility  Bed Mobility Overal bed mobility: Needs Assistance Bed Mobility: Supine to Sit     Supine to sit: Min assist;HOB elevated     General bed mobility comments: slight assist for scooting to EOB  Transfers Overall transfer level: Needs assistance Equipment used: Rolling walker (2 wheeled) Transfers: Sit to/from [facility] Sit to Stand: Mod assist;+2 physical assistance;+2 safety/equipment Stand pivot transfers: Mod assist;+2 physical assistance;+2 safety/equipment       General transfer comment: verbal cues for technique, pt pushes into extension quickly to come to standing position, slow uncoordinated steps over to recliner, poor control for descent to chair  Ambulation/Gait                Stairs            Wheelchair Mobility    Modified Rankin (Stroke Patients Only)       Balance Overall balance  assessment: Needs assistance         Standing balance support: Bilateral upper extremity supported;During functional activity Standing balance-Leahy Scale: Zero                               Pertinent Vitals/Pain Pain Assessment: No/denies pain    Home Living Family/patient expects to be discharged to:: Other (Comment) (group home) Living Arrangements: Group Home                    Prior Function Level of Independence: Independent with assistive device(s)         Comments: patient uses a WC for mobility, assisted with ADL's; receiving HHPT and working on ambulating with PT     Hand Dominance        Extremity/Trunk Assessment   Upper Extremity Assessment Upper Extremity Assessment: RUE deficits/detail;LUE deficits/detail RUE Deficits / Details: able to perform at least 90* shoulder flexion, good grip LUE Deficits / Details: able to perform at least 90* shoulder flexion, good grip    Lower Extremity Assessment Lower Extremity Assessment: RLE deficits/detail;LLE deficits/detail RLE Deficits / Details: able to perform AROM with time, likely uses tone for mobility, poor coordination RLE Coordination: decreased gross motor LLE Deficits / Details: able to perform AROM with time, likely uses tone for mobility, poor coordination LLE Coordination: decreased gross motor    Cervical / Trunk Assessment Cervical / Trunk Assessment: Kyphotic  Communication   Communication: Expressive difficulties  Cognition Arousal/Alertness: Awake/alert Behavior During Therapy: WFL for tasks assessed/performed Overall Cognitive Status: Within Functional Limits for tasks assessed                 General Comments: hx of MR, pt pleasant, cooperative and follows commands    General Comments      Exercises     Assessment/Plan    PT Assessment Patient needs continued PT services  PT Problem List Decreased strength;Decreased mobility;Decreased knowledge of use of  DME;Decreased balance       PT Treatment Interventions DME instruction;Gait training;Therapeutic activities;Therapeutic exercise;Patient/family education;Balance training;Wheelchair mobility training;Functional mobility training    PT Goals (Current goals can be found in the Care Plan section)  Acute Rehab PT Goals PT Goal Formulation: With patient Time For Goal Achievement: 07/31/16 Potential to Achieve Goals: Good    Frequency Min 3X/week   Barriers to discharge        Co-evaluation               End of Session Equipment Utilized During Treatment: Gait belt Activity Tolerance: Patient tolerated treatment well Patient left: in bed;with call bell/phone within reach;with chair alarm set;with nursing/sitter in room Nurse Communication: Mobility status PT Visit Diagnosis: Other abnormalities of gait and mobility (R26.89)         Time: 1431-1445 PT Time Calculation (min) (ACUTE ONLY): 14 min   Charges:   PT Evaluation $PT Eval Low Complexity: 1 Procedure     PT G Codes:         Calyn Rubi,KATHrine E 07/24/2016, 3:50 PM Carmelia Bake, PT, DPT 07/24/2016 Pager: [phone]

## 2016-07-25 LAB — BASIC METABOLIC PANEL
Anion gap: 5 (ref 5–15)
BUN: 14 mg/dL (ref 6–20)
CHLORIDE: 107 mmol/L (ref 101–111)
CO2: 27 mmol/L (ref 22–32)
CREATININE: 0.96 mg/dL (ref 0.61–1.24)
Calcium: 8 mg/dL — ABNORMAL LOW (ref 8.9–10.3)
GFR calc Af Amer: >60 mL/min (ref >60)
GFR calc non Af Amer: >60 mL/min (ref >60)
Glucose, Bld: 97 mg/dL (ref 65–99)
POTASSIUM: 3.8 mmol/L (ref 3.5–5.1)
Sodium: 139 mmol/L (ref 135–145)

## 2016-07-25 LAB — CBC
HEMATOCRIT: 26.6 % — AB (ref 39.0–52.0)
HEMOGLOBIN: 7.7 g/dL — AB (ref 13.0–17.0)
MCH: 21.8 pg — AB (ref 26.0–34.0)
MCHC: 28.9 g/dL — AB (ref 30.0–36.0)
MCV: 75.4 fL — AB (ref 78.0–100.0)
Platelets: 422 10*3/uL — ABNORMAL HIGH (ref 150–400)
RBC: 3.53 MIL/uL — ABNORMAL LOW (ref 4.22–5.81)
RDW: 21.3 % — ABNORMAL HIGH (ref 11.5–15.5)
WBC: 6.7 10*3/uL (ref 4.0–10.5)

## 2016-07-25 LAB — VITAMIN B12: Vitamin B-12: 206 pg/mL (ref 180–914)

## 2016-07-25 LAB — PREPARE RBC (CROSSMATCH)

## 2016-07-25 MED ORDER — VITAMIN B-12 100 MCG PO TABS
100.0000 ug | ORAL_TABLET | Freq: Every day | ORAL | Status: DC
  Administered 2016-07-26: 100 ug via ORAL
  Filled 2016-07-25 (×2): qty 1

## 2016-07-25 MED ORDER — SODIUM CHLORIDE 0.9 % IV SOLN
Freq: Once | INTRAVENOUS | Status: AC
  Administered 2016-07-25: 15:00:00 via INTRAVENOUS

## 2016-07-25 MED ORDER — CYANOCOBALAMIN 1000 MCG/ML IJ SOLN
1000.0000 ug | Freq: Once | INTRAMUSCULAR | Status: AC
  Administered 2016-07-25: 1000 ug via INTRAMUSCULAR
  Filled 2016-07-25: qty 1

## 2016-07-25 MED ORDER — RISPERIDONE 1 MG PO TABS
2.0000 mg | ORAL_TABLET | Freq: Two times a day (BID) | ORAL | Status: DC
  Administered 2016-07-25 – 2016-07-26 (×2): 2 mg via ORAL
  Filled 2016-07-25 (×2): qty 2

## 2016-07-25 NOTE — Progress Notes (Signed)
PROGRESS NOTE    FRASIER SPRINGBORN  [id number] DOB: [birth year] DOA: 07/21/2016 PCP: [name], MD   Brief Narrative: [name] a 51 y.o.malewith a history of TBI, MR, spastic paraplegia, chronic anemia, and s/p gasterctomy who presented with abnormal lab work. He had recent blood work showing an abnormally low hemoglobin, so he was sent to the ED for evaluation. He has complained of some abdominal pain sometime last week improved with pepto-bismol, but otherwise denies any symptoms. He has chronic constipation and denies noticing any GI bleeding, recent abdominal pain, N/V/D. No dyspnea, chest pain or palpitations. He had been hospitalized 2 months prior with acute respiratory failure due to aspiration pneumonia requiring intubation. Hemoglobin progressively dropped during that admission and he required 1u PRBCs. No work up was performed at that time, discharge hemoglobin was 8.8. In 2013 EGD showed erosive esophagitis and hiatal hernia, colonoscopy showed only internal hemorrhoids.   On arrival to the ED, the patient appeared afebrile, pulse 96-103, respirations 17-22, and all other vitals within normal limits. Lab work revealed hemoglobin 6, platelets 580, and all other labs within normal limits. Chest x-ray showed right suprahilar and basilar opacities suspicious for atelectasis versus recurrent pneumonia. Patient was ordered to be transfused 2 units of PRBCs with improvement in hemoglobin to 8.2. GI was consulted for acute anemia and concern for GI bleeding.    Assessment & Plan:   Principal Problem:   Acute blood loss anemia Active Problems:   GERD with esophagitis   Spastic quadriparesis (HCC)   Adrenal insufficiency (Addison's disease) (HCC)   GI bleed   Thrombocytosis (HCC)   Ulcerative esophagitis  Acute Anemia;  Hb on admission at 6. After Blood transfusion it has increased to 8.  Advance diet,  GI is not planning further procedure.  Patient has received 2 units of  PRBC>  Started  iron.  Hb slightly decreased to 7.9. Repeat labs in am.  B 12 low normal start supplement  and folic acid.  Hb slightly decreased. Will transfuse another unit, no evidence of GI bleed.   Fever; possible aspiration PNA  suspect aspiration.  Continue with Unasyn.  Speech to do swallow evaluation. Diet change to dysphagia diet  Follow blood culture.  No history of diarrhea, no worsening cough.  UA; negative for infection.    History of traumatic brain injury: w/ cognitive deficits and spastic quadriparesis: Chronic, stable - Continue home medications: carbamazepine, lamictal, lexapro, cogentin -per caregiver patient is on Risperdal; 1 mg BID. And klonopin at bedtime. I have order this medications.   Adrenal insufficiency: Chronic, stable - Continue hydrocortisone, midodrine   DVT prophylaxis: SCD.  Code Status:  Full code.  Family Communication: none at bedside.  Disposition Plan: remain inpatient for IV antibiotics and repeat hb in am.   Consultants:   GI   Procedures: none   Antimicrobials:   Unasyn 2-28   Subjective: He had BM , brown per report.  He is bore.   Objective: Vitals:   07/25/16 1237 07/25/16 1408 07/25/16 1630 07/25/16 1702  BP: 100/62 (!) 91/56 99/62 110/71  Pulse: 97 91 91 92  Resp: 19 18 18 18   Temp: 97.9 F (36.6 C) 97.8 F (36.6 C) 98.5 F (36.9 C) 99 F (37.2 C)  TempSrc: Oral Oral Oral Oral  SpO2: 97% 94% 94% 94%  Weight:      Height:        Intake/Output Summary (Last 24 hours) at 07/25/16 1740 Last data filed at 07/25/16  1636  Gross per 24 hour  Intake          2709.75 ml  Output             2800 ml  Net           -90.25 ml   Filed Weights   07/21/16 2150 07/22/16 0210  Weight: 81.6 kg (180 lb) 81.6 kg (180 lb)    Examination:  General exam: Appears calm and comfortable  Respiratory system: Clear to auscultation. Respiratory effort normal. Cardiovascular system: S1 & S2 heard, RRR. No JVD, murmurs,  rubs, gallops or clicks. No pedal edema. Gastrointestinal system: Abdomen is nondistended, soft and nontender. No organomegaly or masses felt. Normal bowel sounds heard. Central nervous system: Alert and oriented.  Extremities: Symmetric 5 x 5 power. Skin: No rashes, lesions or ulcers     Data Reviewed: I have personally reviewed following labs and imaging studies  CBC:  Recent Labs Lab 07/22/16 1711 07/22/16 2229 07/23/16 0508 07/24/16 0831 07/25/16 0655  WBC 4.0 4.9 4.5 5.1 6.7  NEUTROABS  --  2.9  --   --   --   HGB 8.1* 8.0* 8.1* 7.9* 7.7*  HCT 28.0* 27.1* 28.1* 27.8* 26.6*  MCV 72.2* 72.7* 73.4* 73.9* 75.4*  PLT 456* 438* 448* 394 [id number]*   Basic Metabolic Panel:  Recent Labs Lab 07/21/16 1940 07/22/16 0848 07/22/16 2229 07/24/16 0831 07/25/16 0655  NA 140 138 136 140 139  K 3.9 3.6 3.9 4.0 3.8  CL 107 104 105 107 107  CO2 28 26 26 27 27   GLUCOSE 98 113* 86 92 97  BUN 13 8 8 11 14   CREATININE 0.75 0.94 0.93 1.06 0.96  CALCIUM 8.3* 7.9* 7.8* 8.0* 8.0*   GFR: Estimated Creatinine Clearance: 107.4 mL/min (by C-G formula based on SCr of 0.96 mg/dL). Liver Function Tests:  Recent Labs Lab 07/22/16 2229 07/24/16 0831  AST 17 20  ALT 10* 11*  ALKPHOS 98 88  BILITOT 0.4 0.6  PROT 6.5 6.4*  ALBUMIN 2.6* 2.3*   No results for input(s): LIPASE, AMYLASE in the last 168 hours. No results for input(s): AMMONIA in the last 168 hours. Coagulation Profile: No results for input(s): INR, PROTIME in the last 168 hours. Cardiac Enzymes: No results for input(s): CKTOTAL, CKMB, CKMBINDEX, TROPONINI in the last 168 hours. BNP (last 3 results) No results for input(s): PROBNP in the last 8760 hours. HbA1C: No results for input(s): HGBA1C in the last 72 hours. CBG:  Recent Labs Lab 07/21/16 2117  GLUCAP 95   Lipid Profile: No results for input(s): CHOL, HDL, LDLCALC, TRIG, CHOLHDL, LDLDIRECT in the last 72 hours. Thyroid Function Tests: No results for input(s):  TSH, T4TOTAL, FREET4, T3FREE, THYROIDAB in the last 72 hours. Anemia Panel:  Recent Labs  07/25/16 0655  VITAMINB12 206   Sepsis Labs:  Recent Labs Lab 07/22/16 2246 07/23/16 0120  LATICACIDVEN 0.8 0.6    Recent Results (from the past 240 hour(s))  MRSA PCR Screening     Status: None   Collection Time: 07/22/16 12:40 PM  Result Value Ref Range Status   MRSA by PCR NEGATIVE NEGATIVE Final    Comment:        The GeneXpert MRSA Assay (FDA approved for NASAL specimens only), is one component of a comprehensive MRSA colonization surveillance program. It is not intended to diagnose MRSA infection nor to guide or monitor treatment for MRSA infections.   Culture, blood (routine x 2)  Status: None (Preliminary result)   Collection Time: 07/22/16 10:29 PM  Result Value Ref Range Status   Specimen Description BLOOD RIGHT ANTECUBITAL  Final   Special Requests BOTTLES DRAWN AEROBIC AND ANAEROBIC 5.5 CC EA  Final   Culture   Final    NO GROWTH 2 DAYS Performed at [facility], [place] [address], [place], [place] [address]    Report Status PENDING  Incomplete  Culture, blood (routine x 2)     Status: None (Preliminary result)   Collection Time: 07/22/16 10:36 PM  Result Value Ref Range Status   Specimen Description BLOOD LAC  Final   Special Requests BOTTLES DRAWN AEROBIC AND ANAEROBIC 5CC EA  Final   Culture   Final    NO GROWTH 2 DAYS Performed at [facility], [place] [address] [place], [place] [address]    Report Status PENDING  Incomplete         Radiology Studies: No results found.      Scheduled Meds: . ampicillin-sulbactam (UNASYN) IV  3 g Intravenous Q6H  . benztropine  0.5 mg Oral Daily  . carbamazepine  200 mg Oral TID  . clonazePAM  0.5 mg Oral QHS  . escitalopram  10 mg Oral Daily  . feeding supplement (ENSURE ENLIVE)  237 mL Oral BID BM  . ferrous sulfate  325 mg Oral BID WC  . hydrocortisone  10 mg Oral Q breakfast  . hydrocortisone   5 mg Oral Q supper  . lamoTRIgine  100 mg Oral BID  . midodrine  10 mg Oral TID WC  . pantoprazole  40 mg Oral BID  . RESOURCE THICKENUP CLEAR   Oral TID WC & HS  . risperiDONE  2 mg Oral BID   Continuous Infusions:    LOS: 3 days    Time spent: 35 minutes.     Elmarie Shiley, MD [facility] Pager [phone]  If 7PM-7AM, please contact night-coverage [email or web address] Password [facility] 07/25/2016, 5:40 PM

## 2016-07-25 NOTE — Progress Notes (Signed)
PT Cancellation Note  Patient Details Name: [name] MRN: [id number] DOB: 05-Sep-1965   Cancelled Treatment:    Reason Eval/Treat Not Completed: Medical issues which prohibited therapy (getting RBC's . )   Claretha Cooper 07/25/2016, 4:53 PM Tresa Endo PT [phone]

## 2016-07-25 NOTE — NC FL2 (Signed)
[place] LEVEL OF CARE SCREENING TOOL     IDENTIFICATION  Patient Name: [name] Birthdate: 01/28/1966 Sex: male Admission Date (Current Location): 07/21/2016  [facility] and [place] Number:  Herbalist and Address:  [facility],  [place] [address] [place]      Provider Number: [id number]  Attending Physician Name and Address:  Elmarie Shiley, MD  Relative Name and Phone Number:       Current Level of Care: Other (Comment) (Group Home) Recommended Level of Care:  (Group Home) Prior Approval Number:    Date Approved/Denied:   PASRR Number:    Discharge Plan:  (Group Home [facility])    Current Diagnoses: Patient Active Problem List   Diagnosis Date Noted  . GI bleed 07/22/2016  . Thrombocytosis ([place]) 07/22/2016  . Acute blood loss anemia   . Ulcerative esophagitis   . Symptomatic anemia 07/21/2016  . Adrenal insufficiency (Addison's disease) ([place]) 06/13/2016  . Bipolar I disorder ([place]) 06/13/2016  . Allergic rhinitis 06/13/2016  . Slow transit constipation 06/13/2016  . Pneumothorax, right   . HAP (hospital-acquired pneumonia)   . Bilateral pulmonary infiltrates on chest x-ray   . Arterial hypotension   . HCAP (healthcare-associated pneumonia)   . Acute respiratory failure with hypoxemia ([place]) 04/30/2016  . Anemia, iron deficiency 04/25/2016  . Altered mental status   . Aspiration pneumonia of right lower lobe due to gastric secretions ([place])   . Sepsis ([place]) 04/23/2016  . Pressure injury of skin 04/23/2016  . Community acquired pneumonia of right lower lobe of lung ([place]) 04/22/2016  . Absolute anemia 02/22/2015  . Closed head injury 02/22/2015  . Spastic quadriparesis ([place]) 02/22/2015  . Anxiety state 02/22/2015  . 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 Depression 03/28/2007  . GERD with esophagitis 02/24/2007  . 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    Orientation RESPIRATION BLADDER  Height & Weight     Self  Normal Indwelling catheter Weight: 180 lb (81.6 kg) (per pt) Height:  6\' 3"  (190.5 cm) (per pt)  BEHAVIORAL SYMPTOMS/MOOD NEUROLOGICAL BOWEL NUTRITION STATUS      Continent  (Dyshagia 3-Chopped food and Nector Thick )  AMBULATORY STATUS COMMUNICATION OF NEEDS Skin   Extensive Assist Verbally Normal                       Personal Care Assistance Level of Assistance  Bathing, Feeding, Dressing Bathing Assistance: Maximum assistance Feeding assistance: Limited assistance Dressing Assistance: Maximum assistance     Functional Limitations Info  Sight, Hearing Sight Info: Adequate Hearing Info: Adequate Speech Info: Adequate    SPECIAL CARE FACTORS FREQUENCY                       Contractures Contractures Info: Not present    Additional Factors Info  Code Status, Allergies Code Status Info: Fullcode Allergies Info: Pineapple           Current Medications (07/25/2016):  This is the current hospital active medication list Current Facility-Administered Medications  Medication Dose Route Frequency Provider Last Rate Last Dose  . acetaminophen (TYLENOL) tablet 650 mg  650 mg Oral Q6H PRN Norval Morton, MD   650 mg at 07/25/16 0505   Or  . acetaminophen (TYLENOL) suppository 650 mg  650 mg Rectal Q6H PRN Rondell A Tamala Julian, MD      . albuterol (PROVENTIL) (2.5 MG/3ML) 0.083% nebulizer solution 2.5  mg  2.5 mg Nebulization Q2H PRN Norval Morton, MD      . Ampicillin-Sulbactam (UNASYN) 3 g in sodium chloride 0.9 % 100 mL IVPB  3 g Intravenous Q6H Luiz Ochoa, RPH   3 g at 07/25/16 1545  . benztropine (COGENTIN) tablet 0.5 mg  0.5 mg Oral Daily Norval Morton, MD   0.5 mg at 07/25/16 1031  . carbamazepine (TEGRETOL) tablet 200 mg  200 mg Oral TID Norval Morton, MD   200 mg at 07/25/16 1550  . clonazePAM (KLONOPIN) tablet 0.5 mg  0.5 mg Oral QHS Belkys A Regalado, MD   0.5 mg at 07/24/16 2300  . escitalopram (LEXAPRO) tablet 10 mg  10 mg  Oral Daily Norval Morton, MD   10 mg at 07/25/16 0948  . feeding supplement (ENSURE ENLIVE) (ENSURE ENLIVE) liquid 237 mL  237 mL Oral BID BM Belkys A Regalado, MD   237 mL at 07/25/16 1400  . ferrous sulfate tablet 325 mg  325 mg Oral BID WC Belkys A Regalado, MD   325 mg at 07/25/16 0948  . hydrocortisone (CORTEF) tablet 10 mg  10 mg Oral Q breakfast Rondell A Tamala Julian, MD   10 mg at 07/25/16 1031  . hydrocortisone (CORTEF) tablet 5 mg  5 mg Oral Q supper Norval Morton, MD   5 mg at 07/25/16 1550  . lamoTRIgine (LAMICTAL) tablet 100 mg  100 mg Oral BID Norval Morton, MD   100 mg at 07/25/16 0948  . midodrine (PROAMATINE) tablet 10 mg  10 mg Oral TID WC Rondell Charmayne Sheer, MD   10 mg at 07/25/16 1550  . ondansetron (ZOFRAN) tablet 4 mg  4 mg Oral Q6H PRN Norval Morton, MD       Or  . ondansetron (ZOFRAN) injection 4 mg  4 mg Intravenous Q6H PRN Norval Morton, MD   4 mg at 07/24/16 1959  . pantoprazole (PROTONIX) EC tablet 40 mg  40 mg Oral BID Norval Morton, MD   40 mg at 07/25/16 0947  . RESOURCE THICKENUP CLEAR   Oral TID WC & HS Belkys A Regalado, MD      . risperiDONE (RISPERDAL) tablet 2 mg  2 mg Oral BID Belkys A Regalado, MD         Discharge Medications: Please see discharge summary for a list of discharge medications.  Relevant Imaging Results:  Relevant Lab Results:   Additional Information SSN [id number]  Lia Hopping, LCSW

## 2016-07-26 LAB — BPAM RBC
BLOOD PRODUCT EXPIRATION DATE: 201803282359
ISSUE DATE / TIME: 201803021637
UNIT TYPE AND RH: 5100

## 2016-07-26 LAB — BASIC METABOLIC PANEL
ANION GAP: 7 (ref 5–15)
BUN: 17 mg/dL (ref 6–20)
CO2: 24 mmol/L (ref 22–32)
CREATININE: 1.07 mg/dL (ref 0.61–1.24)
Calcium: 8.2 mg/dL — ABNORMAL LOW (ref 8.9–10.3)
Chloride: 106 mmol/L (ref 101–111)
GFR calc non Af Amer: >60 mL/min (ref >60)
Glucose, Bld: 119 mg/dL — ABNORMAL HIGH (ref 65–99)
Potassium: 3.8 mmol/L (ref 3.5–5.1)
SODIUM: 137 mmol/L (ref 135–145)

## 2016-07-26 LAB — TYPE AND SCREEN
ABO/RH(D): O POS
ANTIBODY SCREEN: NEGATIVE
UNIT DIVISION: 0

## 2016-07-26 LAB — CBC
HEMATOCRIT: 31.9 % — AB (ref 39.0–52.0)
Hemoglobin: 9.2 g/dL — ABNORMAL LOW (ref 13.0–17.0)
MCH: 22.1 pg — ABNORMAL LOW (ref 26.0–34.0)
MCHC: 28.8 g/dL — AB (ref 30.0–36.0)
MCV: 76.7 fL — ABNORMAL LOW (ref 78.0–100.0)
PLATELETS: 403 10*3/uL — AB (ref 150–400)
RBC: 4.16 MIL/uL — ABNORMAL LOW (ref 4.22–5.81)
RDW: 21.7 % — AB (ref 11.5–15.5)
WBC: 7.8 10*3/uL (ref 4.0–10.5)

## 2016-07-26 MED ORDER — HYDROCORTISONE 5 MG PO TABS: 5.0000 mg | ORAL_TABLET | Freq: Every day | ORAL | 0 refills | Status: DC

## 2016-07-26 MED ORDER — FERROUS SULFATE 325 (65 FE) MG PO TABS: 325.0000 mg | ORAL_TABLET | Freq: Two times a day (BID) | ORAL | 3 refills | Status: AC

## 2016-07-26 MED ORDER — AMOXICILLIN-POT CLAVULANATE 875-125 MG PO TABS: 1.0000 | ORAL_TABLET | Freq: Two times a day (BID) | ORAL | 0 refills | Status: DC

## 2016-07-26 MED ORDER — CYANOCOBALAMIN 100 MCG PO TABS: 100.0000 ug | ORAL_TABLET | Freq: Every day | ORAL | 0 refills | Status: DC

## 2016-07-26 MED ORDER — CLONAZEPAM 0.5 MG PO TABS: 0.5000 mg | ORAL_TABLET | Freq: Every day | ORAL | 0 refills | Status: DC

## 2016-07-26 MED ORDER — ENSURE ENLIVE PO LIQD: 237.0000 mL | Freq: Two times a day (BID) | ORAL | 12 refills | Status: DC

## 2016-07-26 NOTE — Clinical Social Work Note (Addendum)
Patient medically stable for discharge and will return to [facility] today, transported by facility staff person Bell Buckle. CSW spoke with facility staff person Joppa [phone]) regarding discharge and she requested that patient's discharge information come with him.  CSW informed by patient's nurse that Mr. Blasingame does not want to return to group home, but wants to d/c to a skilled facility. CSW re-contacted Joppa and was provided with patient's guardian and his sister's contact information: Mother Humphrey Pickler [phone]) called and number disconnected. Was able to talk with mother and sister Jackelyn Poling using [place] number - [phone].  CSW talked with mother and his sister Charlies Silvers and it is the guardian's decision for patient to return to the group home. Both mother and sister report that the group home can meet his needs and also provide the physical therapy patient needs. CSW advised by sister that her mother recently moved in with her and the phone number [phone] is disconnected. Ms. Faggart can be reached using her daughter's phone number - [phone].  Berwyn Bigley Givens, MSW, LCSW Licensed Clinical Social Worker [place] [phone]

## 2016-07-26 NOTE — Progress Notes (Signed)
Pt. Left via wheelchair with Aletta Edouard, discharge to [facility]. No respiratory distress noted

## 2016-07-26 NOTE — Discharge Summary (Addendum)
Physician Discharge Summary  [name] [id number] DOB: Nov 28, 1965 DOA: 07/21/2016  PCP: [name], MD  Admit date: 07/21/2016 Discharge date: 07/26/2016  Admitted From: Group home Disposition:  Group home   Recommendations for Outpatient Follow-up:  1. Follow up with PCP in 1-2 weeks 2. Please obtain BMP/CBC in one week     Discharge Condition: Stable.  CODE STATUS: Full code.  Diet recommendation: Dysphagia 3 with nectar thick   Brief/Interim Summary:  [name] a 51 y.o.malewith a history ofTBI, MR, spastic paraplegia, chronic anemia, and s/p gasterctomy who presentedwith abnormal lab work. He had recent blood work showing an abnormally low hemoglobin, so he was sent to the ED for evaluation. He has complained of some abdominal pain sometime last week improved with pepto-bismol, but otherwise denies any symptoms. He has chronic constipation and denies noticing any GI bleeding, recent abdominal pain, N/V/D. No dyspnea, chest pain or palpitations. He had beenhospitalized 2 months priorwith acute respiratory failure due to aspiration pneumoniarequiring intubation. Hemoglobin progressively dropped during that admission and he required 1u PRBCs. No work up was performed at that time, discharge hemoglobin was 8.8. In 2013 EGD showed erosive esophagitis and hiatal hernia, colonoscopy showed only internal hemorrhoids.   On arrival to the ED, the patient appearedafebrile, pulse 96-103, respirations 17-22, and all other vitals within normal limits. Lab work revealed hemoglobin 6, platelets 580, and all other labs within normal limits. Chest x-ray showed right suprahilar and basilar opacities suspicious for atelectasis versus recurrent pneumonia. Patient was ordered to be transfused 2 units of PRBCs with improvement in hemoglobin to 8.2. GI was consulted for acute anemia and concern for GI bleeding.    Assessment & Plan:   Principal Problem:   Acute blood loss  anemia Active Problems:   GERD with esophagitis   Spastic quadriparesis (HCC)   Adrenal insufficiency (Addison's disease) (HCC)   GI bleed   Thrombocytosis (HCC)   Ulcerative esophagitis  Acute Anemia;  Hb on admission at 6.  Advance diet,  GI is not planning further procedure.  Patient has received 3 units of PRBC>  Started  iron.  Hb slightly decreased to 7.9. Repeat labs in am.  B 12 low normal started supplement  and folic acid pending.  Hb stable at 9.   Fever; possible aspiration PNA  suspect aspiration.  Received  Unasyn for 4 days.  Speech to do swallow evaluation. Diet change to dysphagia diet  blood culture no growth  UA; negative for infection.    History of traumatic brain injury: w/ cognitive deficits and spastic quadriparesis: Chronic, stable - Continue home medications: carbamazepine, lamictal, lexapro, cogentin -per caregiver patient is on Risperdal; 2 mg BID. And klonopin at bedtime. I have order this medications.   Adrenal insufficiency: Chronic, stable - Continue hydrocortisone, midodrine   Discharge Diagnoses:  Principal Problem:   Acute blood loss anemia Active Problems:   GERD with esophagitis   Spastic quadriparesis (HCC)   Adrenal insufficiency (Addison's disease) (HCC)   GI bleed   Thrombocytosis (HCC)   Ulcerative esophagitis    Discharge Instructions  Discharge Instructions    Increase activity slowly    Complete by:  As directed      Allergies as of 07/26/2016      Reactions   Pineapple Anaphylaxis      Medication List    STOP taking these medications   docusate sodium 100 MG capsule Commonly known as:  COLACE   famotidine 20 MG tablet Commonly known  as:  PEPCID   senna-docusate 8.6-50 MG tablet Commonly known as:  Senokot-S     TAKE these medications   benztropine 0.5 MG tablet Commonly known as:  COGENTIN Take 0.5 mg by mouth every morning.   carbamazepine 200 MG tablet Commonly known as:  TEGRETOL Take 1  tablet (200 mg total) by mouth 3 (three) times daily.   clonazePAM 0.5 MG tablet Commonly known as:  KLONOPIN Take 1 tablet (0.5 mg total) by mouth at bedtime.   cyanocobalamin 100 MCG tablet Take 1 tablet (100 mcg total) by mouth daily. Start taking on:  07/27/2016   escitalopram 10 MG tablet Commonly known as:  LEXAPRO Take 10 mg by mouth every morning.   feeding supplement (ENSURE ENLIVE) Liqd Take 237 mLs by mouth 2 (two) times daily between meals.   ferrous sulfate 325 (65 FE) MG tablet Take 1 tablet (325 mg total) by mouth 2 (two) times daily with a meal.   hydrocortisone 10 MG tablet Commonly known as:  CORTEF Take 1 tablet (10 mg total) by mouth daily with breakfast. Please give at 7am every morning What changed:  Another medication with the same name was changed. Make sure you understand how and when to take each.   hydrocortisone 5 MG tablet Commonly known as:  CORTEF Take 1 tablet (5 mg total) by mouth daily at 12 noon. What changed:  how much to take  when to take this  additional instructions   lamoTRIgine 100 MG tablet Commonly known as:  LAMICTAL Take 1 tablet (100 mg total) by mouth 2 (two) times daily.   midodrine 10 MG tablet Commonly known as:  PROAMATINE Take 1 tablet (10 mg total) by mouth 3 (three) times daily with meals.   pantoprazole 40 MG tablet Commonly known as:  PROTONIX Take 40 mg by mouth every morning.   risperiDONE 2 MG tablet Commonly known as:  RISPERDAL Take 2 mg by mouth 2 (two) times daily.       Allergies  Allergen Reactions  . Pineapple Anaphylaxis    Consultations:  GI   Procedures/Studies: Dg Chest Port 1 View  Result Date: 07/22/2016 CLINICAL DATA:  Fever and tachycardia EXAM: PORTABLE CHEST 1 VIEW COMPARISON:  07/21/2016 FINDINGS: Heart is borderline enlarged but stable. No aortic aneurysm. Minimal atelectasis at the right lung base. No pulmonary consolidations. No pneumothorax or pulmonary edema. Old healed  fracture deformity of the right humeral shaft. Left seventh and eighth chronic rib fractures. IMPRESSION: No active disease. Electronically Signed   By: Ashley Royalty M.D.   On: 07/22/2016 23:11   Dg Chest Portable 1 View  Result Date: 07/21/2016 CLINICAL DATA:  Cough. EXAM: PORTABLE CHEST 1 VIEW COMPARISON:  Radiograph 05/12/2016, CT 05/09/2016 FINDINGS: Improved right lung aeration with minimal residual ill-defined suprahilar and right basilar patchy opacity. Stable cardiomegaly and mediastinal contours. Mild interstitial and bronchial thickening. No pleural fluid or pneumothorax. Unchanged osseous structures. IMPRESSION: Improved right lung aeration from exam 3 months prior with residual or recurrent right basilar and suprahilar patchy opacities, atelectasis/scarring versus recurrent infection. Electronically Signed   By: Jeb Levering M.D.   On: 07/21/2016 22:33      Subjective: He is feeling ok, had BM   Discharge Exam: Vitals:   07/25/16 2011 07/26/16 0420  BP: 106/62 127/82  Pulse: 92 97  Resp: 20 19  Temp: 100.3 F (37.9 C) 98.5 F (36.9 C)   Vitals:   07/25/16 1702 07/25/16 1854 07/25/16 2011 07/26/16 0420  BP:  110/71 102/64 106/62 127/82  Pulse: 92 89 92 97  Resp: 18 18 20 19   Temp: 99 F (37.2 C) 99.3 F (37.4 C) 100.3 F (37.9 C) 98.5 F (36.9 C)  TempSrc: Oral Oral Oral Oral  SpO2: 94% 92% 92% 92%  Weight:      Height:        General: Pt is alert, awake, not in acute distress Cardiovascular: RRR, S1/S2 +, no rubs, no gallops Respiratory: CTA bilaterally, no wheezing, no rhonchi Abdominal: Soft, NT, ND, bowel sounds + Extremities: no edema, no cyanosis    The results of significant diagnostics from this hospitalization (including imaging, microbiology, ancillary and laboratory) are listed below for reference.     Microbiology: Recent Results (from the past 240 hour(s))  MRSA PCR Screening     Status: None   Collection Time: 07/22/16 12:40 PM  Result  Value Ref Range Status   MRSA by PCR NEGATIVE NEGATIVE Final    Comment:        The GeneXpert MRSA Assay (FDA approved for NASAL specimens only), is one component of a comprehensive MRSA colonization surveillance program. It is not intended to diagnose MRSA infection nor to guide or monitor treatment for MRSA infections.   Culture, blood (routine x 2)     Status: None (Preliminary result)   Collection Time: 07/22/16 10:29 PM  Result Value Ref Range Status   Specimen Description BLOOD RIGHT ANTECUBITAL  Final   Special Requests BOTTLES DRAWN AEROBIC AND ANAEROBIC 5.5 CC EA  Final   Culture   Final    NO GROWTH 2 DAYS Performed at [facility], [place] [address], [place], [place] [address]    Report Status PENDING  Incomplete  Culture, blood (routine x 2)     Status: None (Preliminary result)   Collection Time: 07/22/16 10:36 PM  Result Value Ref Range Status   Specimen Description BLOOD LAC  Final   Special Requests BOTTLES DRAWN AEROBIC AND ANAEROBIC 5CC EA  Final   Culture   Final    NO GROWTH 2 DAYS Performed at [facility], [place] [address], [place], [place] [address]    Report Status PENDING  Incomplete     Labs: BNP (last 3 results)  Recent Labs  04/30/16 1217 05/08/16 0505  BNP 184.3* [id number]*   Basic Metabolic Panel:  Recent Labs Lab 07/22/16 0848 07/22/16 2229 07/24/16 0831 07/25/16 0655 07/26/16 0547  NA 138 136 140 139 137  K 3.6 3.9 4.0 3.8 3.8  CL 104 105 107 107 106  CO2 26 26 27 27 24   GLUCOSE 113* 86 92 97 119*  BUN 8 8 11 14 17   CREATININE 0.94 0.93 1.06 0.96 1.07  CALCIUM 7.9* 7.8* 8.0* 8.0* 8.2*   Liver Function Tests:  Recent Labs Lab 07/22/16 2229 07/24/16 0831  AST 17 20  ALT 10* 11*  ALKPHOS 98 88  BILITOT 0.4 0.6  PROT 6.5 6.4*  ALBUMIN 2.6* 2.3*   No results for input(s): LIPASE, AMYLASE in the last 168 hours. No results for input(s): AMMONIA in the last 168 hours. CBC:  Recent Labs Lab 07/22/16 2229  07/23/16 0508 07/24/16 0831 07/25/16 0655 07/26/16 0547  WBC 4.9 4.5 5.1 6.7 7.8  NEUTROABS 2.9  --   --   --   --   HGB 8.0* 8.1* 7.9* 7.7* 9.2*  HCT 27.1* 28.1* 27.8* 26.6* 31.9*  MCV 72.7* 73.4* 73.9* 75.4* 76.7*  PLT 438* 448* 394 422* 403*  Cardiac Enzymes: No results for input(s): CKTOTAL, CKMB, CKMBINDEX, TROPONINI in the last 168 hours. BNP: Invalid input(s): POCBNP CBG:  Recent Labs Lab 07/21/16 2117  GLUCAP 95   D-Dimer No results for input(s): DDIMER in the last 72 hours. Hgb A1c No results for input(s): HGBA1C in the last 72 hours. Lipid Profile No results for input(s): CHOL, HDL, LDLCALC, TRIG, CHOLHDL, LDLDIRECT in the last 72 hours. Thyroid function studies No results for input(s): TSH, T4TOTAL, T3FREE, THYROIDAB in the last 72 hours.  Invalid input(s): FREET3 Anemia work up  Recent Labs  07/25/16 0655  VITAMINB12 206   Urinalysis    Component Value Date/Time   COLORURINE STRAW (A) 07/23/2016 0650   APPEARANCEUR CLEAR 07/23/2016 0650   LABSPEC 1.004 (L) 07/23/2016 0650   PHURINE 7.0 07/23/2016 0650   GLUCOSEU NEGATIVE 07/23/2016 0650   HGBUR NEGATIVE 07/23/2016 0650   HGBUR negative 07/31/2009 [place] 07/23/2016 0650   KETONESUR NEGATIVE 07/23/2016 0650   PROTEINUR NEGATIVE 07/23/2016 0650   UROBILINOGEN 0.2 09/11/2014 1951   NITRITE NEGATIVE 07/23/2016 0650   LEUKOCYTESUR NEGATIVE 07/23/2016 0650   Sepsis Labs Invalid input(s): PROCALCITONIN,  WBC,  LACTICIDVEN Microbiology Recent Results (from the past 240 hour(s))  MRSA PCR Screening     Status: None   Collection Time: 07/22/16 12:40 PM  Result Value Ref Range Status   MRSA by PCR NEGATIVE NEGATIVE Final    Comment:        The GeneXpert MRSA Assay (FDA approved for NASAL specimens only), is one component of a comprehensive MRSA colonization surveillance program. It is not intended to diagnose MRSA infection nor to guide or monitor treatment for MRSA  infections.   Culture, blood (routine x 2)     Status: None (Preliminary result)   Collection Time: 07/22/16 10:29 PM  Result Value Ref Range Status   Specimen Description BLOOD RIGHT ANTECUBITAL  Final   Special Requests BOTTLES DRAWN AEROBIC AND ANAEROBIC 5.5 CC EA  Final   Culture   Final    NO GROWTH 2 DAYS Performed at [facility], [place] [address] [place], [place] [address]    Report Status PENDING  Incomplete  Culture, blood (routine x 2)     Status: None (Preliminary result)   Collection Time: 07/22/16 10:36 PM  Result Value Ref Range Status   Specimen Description BLOOD LAC  Final   Special Requests BOTTLES DRAWN AEROBIC AND ANAEROBIC 5CC EA  Final   Culture   Final    NO GROWTH 2 DAYS Performed at [name], [place] [address], [place], [place] [address]    Report Status PENDING  Incomplete     Time coordinating discharge: Over 30 minutes  SIGNED:   Elmarie Shiley, MD  [facility] 07/26/2016, 10:31 AM Pager [phone]  If 7PM-7AM, please contact night-coverage [email or web address] Password [email or web address]

## 2016-07-26 NOTE — Progress Notes (Signed)
Discharge teaching completed with Group Home representative [place]. Pt. discharging to [facility]. Dr. Fay Records given, answered questions. Medications due and times on discharge paperwork and reviewed with [facility].

## 2016-07-26 NOTE — Progress Notes (Signed)
Report called to Joppa at [facility]. Pt. to discharge there this afternoon.

## 2016-07-28 ENCOUNTER — Other Ambulatory Visit: Payer: Self-pay | Admitting: Adult Health

## 2016-07-28 LAB — CULTURE, BLOOD (ROUTINE X 2)
CULTURE: NO GROWTH
Culture: NO GROWTH

## 2016-07-28 LAB — FOLATE RBC
FOLATE, HEMOLYSATE: 453.3 ng/mL
Folate, RBC: 1717 ng/mL (ref >498)
HEMATOCRIT: 26.4 % — AB (ref 37.5–51.0)

## 2016-08-01 ENCOUNTER — Encounter (HOSPITAL_COMMUNITY): Payer: Self-pay | Admitting: Emergency Medicine

## 2016-08-01 ENCOUNTER — Inpatient Hospital Stay (HOSPITAL_COMMUNITY)
Admission: EM | Admit: 2016-08-01 | Discharge: 2016-08-03 | DRG: 391 | Disposition: A | Discharge status: Home / Self Care | Payer: Medicare Other | Attending: Internal Medicine | Admitting: Internal Medicine

## 2016-08-01 DIAGNOSIS — G40409 Other generalized epilepsy and epileptic syndromes, not intractable, without status epilepticus: Secondary | ICD-10-CM | POA: Diagnosis present

## 2016-08-01 DIAGNOSIS — K2100 Gastro-esophageal reflux disease with esophagitis, without bleeding: Secondary | ICD-10-CM | POA: Diagnosis present

## 2016-08-01 DIAGNOSIS — F70 Mild intellectual disabilities: Secondary | ICD-10-CM | POA: Diagnosis present

## 2016-08-01 DIAGNOSIS — Z87820 Personal history of traumatic brain injury: Secondary | ICD-10-CM

## 2016-08-01 DIAGNOSIS — K920 Hematemesis: Secondary | ICD-10-CM | POA: Diagnosis present

## 2016-08-01 DIAGNOSIS — J690 Pneumonitis due to inhalation of food and vomit: Secondary | ICD-10-CM | POA: Diagnosis present

## 2016-08-01 DIAGNOSIS — F319 Bipolar disorder, unspecified: Secondary | ICD-10-CM | POA: Diagnosis present

## 2016-08-01 DIAGNOSIS — K922 Gastrointestinal hemorrhage, unspecified: Secondary | ICD-10-CM | POA: Diagnosis present

## 2016-08-01 DIAGNOSIS — G40309 Generalized idiopathic epilepsy and epileptic syndromes, not intractable, without status epilepticus: Secondary | ICD-10-CM | POA: Diagnosis present

## 2016-08-01 DIAGNOSIS — E271 Primary adrenocortical insufficiency: Secondary | ICD-10-CM | POA: Diagnosis not present

## 2016-08-01 DIAGNOSIS — Z903 Acquired absence of stomach [part of]: Secondary | ICD-10-CM

## 2016-08-01 DIAGNOSIS — D62 Acute posthemorrhagic anemia: Secondary | ICD-10-CM | POA: Diagnosis present

## 2016-08-01 DIAGNOSIS — Z79899 Other long term (current) drug therapy: Secondary | ICD-10-CM

## 2016-08-01 DIAGNOSIS — D649 Anemia, unspecified: Secondary | ICD-10-CM | POA: Diagnosis not present

## 2016-08-01 DIAGNOSIS — Z87891 Personal history of nicotine dependence: Secondary | ICD-10-CM

## 2016-08-01 DIAGNOSIS — F419 Anxiety disorder, unspecified: Secondary | ICD-10-CM | POA: Diagnosis present

## 2016-08-01 LAB — CBC
HCT: 29.5 % — ABNORMAL LOW (ref 39.0–52.0)
HCT: 29.9 % — ABNORMAL LOW (ref 39.0–52.0)
HEMOGLOBIN: 8.2 g/dL — AB (ref 13.0–17.0)
Hemoglobin: 8.2 g/dL — ABNORMAL LOW (ref 13.0–17.0)
MCH: 22.9 pg — ABNORMAL LOW (ref 26.0–34.0)
MCH: 22.3 pg — ABNORMAL LOW (ref 26.0–34.0)
MCHC: 27.8 g/dL — ABNORMAL LOW (ref 30.0–36.0)
MCHC: 27.4 g/dL — AB (ref 30.0–36.0)
MCV: 82.4 fL (ref 78.0–100.0)
MCV: 81.3 fL (ref 78.0–100.0)
Platelets: 483 10*3/uL — ABNORMAL HIGH (ref 150–400)
Platelets: 531 10*3/uL — ABNORMAL HIGH (ref 150–400)
RBC: 3.58 MIL/uL — AB (ref 4.22–5.81)
RBC: 3.68 MIL/uL — ABNORMAL LOW (ref 4.22–5.81)
RDW: 29.6 % — ABNORMAL HIGH (ref 11.5–15.5)
RDW: 29.7 % — AB (ref 11.5–15.5)
WBC: 4.1 10*3/uL (ref 4.0–10.5)
WBC: 3.6 10*3/uL — ABNORMAL LOW (ref 4.0–10.5)

## 2016-08-01 LAB — LIPASE, BLOOD: LIPASE: 35 U/L (ref 11–51)

## 2016-08-01 LAB — URINALYSIS, ROUTINE W REFLEX MICROSCOPIC
BILIRUBIN URINE: NEGATIVE
Glucose, UA: NEGATIVE mg/dL
Hgb urine dipstick: NEGATIVE
Ketones, ur: NEGATIVE mg/dL
Leukocytes, UA: NEGATIVE
NITRITE: NEGATIVE
PROTEIN: NEGATIVE mg/dL
SPECIFIC GRAVITY, URINE: 1.011 (ref 1.005–1.030)
pH: 9 — ABNORMAL HIGH (ref 5.0–8.0)

## 2016-08-01 LAB — COMPREHENSIVE METABOLIC PANEL
ALK PHOS: 105 U/L (ref 38–126)
ALT: 27 U/L (ref 17–63)
ANION GAP: 5 (ref 5–15)
AST: 29 U/L (ref 15–41)
Albumin: 2.6 g/dL — ABNORMAL LOW (ref 3.5–5.0)
BILIRUBIN TOTAL: 0.8 mg/dL (ref 0.3–1.2)
BUN: 15 mg/dL (ref 6–20)
CALCIUM: 8.5 mg/dL — AB (ref 8.9–10.3)
CO2: 33 mmol/L — AB (ref 22–32)
Chloride: 103 mmol/L (ref 101–111)
Creatinine, Ser: 0.89 mg/dL (ref 0.61–1.24)
GFR calc Af Amer: >60 mL/min (ref >60)
Glucose, Bld: 111 mg/dL — ABNORMAL HIGH (ref 65–99)
Potassium: 3.7 mmol/L (ref 3.5–5.1)
SODIUM: 141 mmol/L (ref 135–145)
TOTAL PROTEIN: 6.8 g/dL (ref 6.5–8.1)

## 2016-08-01 LAB — POC OCCULT BLOOD, ED: Fecal Occult Bld: POSITIVE — AB

## 2016-08-01 MED ORDER — BENZTROPINE MESYLATE 0.5 MG PO TABS
0.5000 mg | ORAL_TABLET | Freq: Every morning | ORAL | Status: DC
  Administered 2016-08-02 – 2016-08-03 (×2): 0.5 mg via ORAL
  Filled 2016-08-01 (×2): qty 1

## 2016-08-01 MED ORDER — LAMOTRIGINE 100 MG PO TABS
100.0000 mg | ORAL_TABLET | Freq: Two times a day (BID) | ORAL | Status: DC
  Administered 2016-08-01 – 2016-08-02 (×3): 100 mg via ORAL
  Filled 2016-08-01 (×4): qty 1

## 2016-08-01 MED ORDER — SODIUM CHLORIDE 0.9 % IV SOLN
INTRAVENOUS | Status: AC
  Administered 2016-08-01 – 2016-08-02 (×2): via INTRAVENOUS

## 2016-08-01 MED ORDER — CARBAMAZEPINE 200 MG PO TABS
200.0000 mg | ORAL_TABLET | Freq: Three times a day (TID) | ORAL | Status: DC
  Administered 2016-08-01 – 2016-08-03 (×5): 200 mg via ORAL
  Filled 2016-08-01 (×6): qty 1

## 2016-08-01 MED ORDER — RISPERIDONE 1 MG PO TABS
2.0000 mg | ORAL_TABLET | Freq: Two times a day (BID) | ORAL | Status: DC
  Administered 2016-08-01 – 2016-08-03 (×4): 2 mg via ORAL
  Filled 2016-08-01 (×4): qty 2

## 2016-08-01 MED ORDER — ONDANSETRON HCL 4 MG/2ML IJ SOLN: 4.0000 mg | Freq: Four times a day (QID) | INTRAMUSCULAR | Status: DC | PRN

## 2016-08-01 MED ORDER — HYDROCORTISONE 5 MG PO TABS: 5.0000 mg | ORAL_TABLET | Freq: Two times a day (BID) | ORAL | Status: DC

## 2016-08-01 MED ORDER — ACETAMINOPHEN 325 MG PO TABS: 650.0000 mg | ORAL_TABLET | Freq: Four times a day (QID) | ORAL | Status: DC | PRN

## 2016-08-01 MED ORDER — AMOXICILLIN-POT CLAVULANATE 875-125 MG PO TABS
1.0000 | ORAL_TABLET | Freq: Two times a day (BID) | ORAL | Status: DC
  Administered 2016-08-01 – 2016-08-03 (×4): 1 via ORAL
  Filled 2016-08-01 (×4): qty 1

## 2016-08-01 MED ORDER — ALBUTEROL SULFATE (2.5 MG/3ML) 0.083% IN NEBU: 2.5000 mg | INHALATION_SOLUTION | RESPIRATORY_TRACT | Status: DC | PRN

## 2016-08-01 MED ORDER — ACETAMINOPHEN 650 MG RE SUPP: 650.0000 mg | Freq: Four times a day (QID) | RECTAL | Status: DC | PRN

## 2016-08-01 MED ORDER — HYDROCORTISONE 10 MG PO TABS
10.0000 mg | ORAL_TABLET | Freq: Every morning | ORAL | Status: DC
  Administered 2016-08-02 – 2016-08-03 (×2): 10 mg via ORAL
  Filled 2016-08-01 (×2): qty 1

## 2016-08-01 MED ORDER — PANTOPRAZOLE SODIUM 40 MG IV SOLR
40.0000 mg | Freq: Two times a day (BID) | INTRAVENOUS | Status: DC
  Administered 2016-08-01 – 2016-08-03 (×4): 40 mg via INTRAVENOUS
  Filled 2016-08-01 (×4): qty 40

## 2016-08-01 MED ORDER — ONDANSETRON HCL 4 MG PO TABS: 4.0000 mg | ORAL_TABLET | Freq: Four times a day (QID) | ORAL | Status: DC | PRN

## 2016-08-01 MED ORDER — ESCITALOPRAM OXALATE 10 MG PO TABS
10.0000 mg | ORAL_TABLET | Freq: Every morning | ORAL | Status: DC
  Administered 2016-08-02 – 2016-08-03 (×2): 10 mg via ORAL
  Filled 2016-08-01 (×2): qty 1

## 2016-08-01 MED ORDER — SODIUM CHLORIDE 0.9% FLUSH
3.0000 mL | Freq: Two times a day (BID) | INTRAVENOUS | Status: DC
  Administered 2016-08-02 (×2): 3 mL via INTRAVENOUS

## 2016-08-01 MED ORDER — POLYETHYLENE GLYCOL 3350 17 G PO PACK: 17.0000 g | PACK | Freq: Every day | ORAL | Status: DC | PRN

## 2016-08-01 MED ORDER — MIDODRINE HCL 5 MG PO TABS
10.0000 mg | ORAL_TABLET | Freq: Three times a day (TID) | ORAL | Status: DC
  Administered 2016-08-01 – 2016-08-03 (×3): 10 mg via ORAL
  Filled 2016-08-01 (×7): qty 2

## 2016-08-01 MED ORDER — VITAMIN B-12 100 MCG PO TABS
100.0000 ug | ORAL_TABLET | Freq: Every day | ORAL | Status: DC
  Administered 2016-08-02 – 2016-08-03 (×2): 100 ug via ORAL
  Filled 2016-08-01 (×2): qty 1

## 2016-08-01 MED ORDER — PANTOPRAZOLE SODIUM 40 MG IV SOLR
40.0000 mg | Freq: Once | INTRAVENOUS | Status: AC
Start: 2016-08-01 — End: 2016-08-01
  Administered 2016-08-01: 40 mg via INTRAVENOUS
  Filled 2016-08-01: qty 40

## 2016-08-01 MED ORDER — HYDROCORTISONE 5 MG PO TABS
5.0000 mg | ORAL_TABLET | Freq: Every evening | ORAL | Status: DC
  Administered 2016-08-01 – 2016-08-02 (×2): 5 mg via ORAL
  Filled 2016-08-01 (×3): qty 1

## 2016-08-01 NOTE — Clinical Social Work Note (Signed)
Clinical Social Work Assessment  Patient Details  Name: [name] MRN: [id number] Date of Birth: 1965-05-31  Date of referral:  08/01/16               Reason for consult:  Facility Placement                Permission sought to share information with:    Permission granted to share information::  Yes, Verbal Permission Granted  Name::        Agency::     Relationship::     Contact Information:     Housing/Transportation Living arrangements for the past 2 months:  Group Home Source of Information:   (Pt's sister Orson Slick) Patient Interpreter Needed:  None Criminal Activity/Legal Involvement Pertinent to Current Situation/Hospitalization:    Significant Relationships:  Parents, Siblings Lives with:  Facility Resident Do you feel safe going back to the place where you live?  Yes Need for family participation in patient care:  Yes (Comment)  Care giving concerns:  None listed by pt/family   Social Worker assessment / plan:  CSW met with pt and confirmed pt's plan to be discharged back to [place] with Home Health, preferably with advanced Home Care, to live at discharge.  Pt's sister stated if SNF is necessary for short-term rehab then family would prefer [facility] where pt was previously.  Per notes facility staff person at [facility] [phone]).  Per pt's sister and per notes, pt's legal guardian is pt's mother  Onie Hayashi at ph: [phone].  This is also the pt's sister's number.  Pt's sister is Milus Height.  CSW provided active listening and validated pt's sister's concerns over pt's recent multiple visits to the ED, per the pt's siste.  Pt has been living at the pt's group home "for years" prior to being admitted.    Employment status:  Disabled (Comment on whether or not currently receiving Disability) Insurance information:  Medicare, Medicaid [place] PT Recommendations:  Not assessed at this time Information / Referral  to community resources:     Patient/Family's Response to care: Marland Kitchen Patient's mother (legal guardian) and pt's sister agreeable to plan to send out FL-2's/referrals to local SNF's if necessary.  Pt's mother and sister supportive and strongly involved in pt.'s care.  Pt.'s sister pleasant and appreciated CSW intervention.     Patient/Family's Understanding of and Emotional Response to Diagnosis, Current Treatment, and Prognosis:  Still assessing   Emotional Assessment Appearance:    Attitude/Demeanor/Rapport:    Affect (typically observed):  Unable to Assess Orientation:  Oriented to Self, Oriented to Place (Per notes) Alcohol / Substance use:    Psych involvement (Current and /or in the community):     Discharge Needs  Concerns to be addressed:    Readmission within the last 30 days:  Yes Current discharge risk:  None Barriers to Discharge:  Continued Medical Work up (Pt's sister geatly concerned due to multiple visits to the hospital rcently)   Alphonse Guild [place] Mikes, [place] 08/01/2016, 11:03 PM

## 2016-08-01 NOTE — Consult Note (Signed)
[facility] Consult Note  Referring Provider: No ref. provider found Primary Care Physician:  [name], MD Primary Gastroenterologist:  Dr.  Laurel Dimmer Complaint: Vomiting blood HPI: [name] is an 51 y.o. white male  male with severe mental retardation seizure disorder multiple other medical problems requiring 24/7 assistance reported history of some sort of gastrectomy for peptic ulcer disease presents after experiencing apparent witnessed hematemesis to some degree. He had a caretaker verified this but had been with him and notes no further vomiting since this morning. He had a normal colored heme positive stool in the emergency room with a 1 g drop in hemoglobin from baseline a month ago. He was in the hospital at that time with anemia and some drop in baseline and received 1 unit of packed red blood cells. 2 months ago he was in the hospital with pneumonia requiring intubation. He currently denies any abdominal pain or nausea. He had an EGD in 2013 which showed esophagitis and a small hiatal hernia with a mention of any signs of previous surgery.  Past Medical History:  Diagnosis Date  . Anxiety   . Bipolar disorder ([place])   . BPH (benign prostatic hyperplasia)   . Closed head injury   . Constipation   . Depression   . Esophageal ulcer   . Malnutrition ([place])   . 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 Peptic ulcer disease   . Seizures ([place])   . Smoker     Past Surgical History:  Procedure Laterality Date  . ESOPHAGOGASTRODUODENOSCOPY  06/17/2011   Procedure: ESOPHAGOGASTRODUODENOSCOPY (EGD);  Surgeon: Wonda Horner, MD;  Location: [facility];  Service: Endoscopy;  Laterality: N/A;  . GASTRECTOMY      Medications Prior to Admission  Medication Sig Dispense Refill  . amoxicillin-clavulanate (AUGMENTIN) 875-125 MG tablet Take 1 tablet by mouth 2 (two) times daily. Started 03/03  for unknown amount of days    . benztropine (COGENTIN) 0.5 MG tablet Take 0.5 mg by mouth every morning.     . carbamazepine (TEGRETOL) 200 MG tablet Take 1 tablet (200 mg total) by mouth 3 (three) times daily. 30 tablet 0  . escitalopram (LEXAPRO) 10 MG tablet Take 10 mg by mouth every morning.     . ferrous sulfate 325 (65 FE) MG tablet Take 1 tablet (325 mg total) by mouth 2 (two) times daily with a meal. 60 tablet 3  . hydrocortisone (CORTEF) 5 MG tablet Take 1 tablet (5 mg total) by mouth daily at 12 noon. (Patient taking differently: Take 5-10 mg by mouth 2 (two) times daily. Take 10 mg every morning and 5 mg every evening) 30 tablet 0  . lamoTRIgine (LAMICTAL) 100 MG tablet Take 1 tablet (100 mg total) by mouth 2 (two) times daily. 30 tablet 0  . midodrine (PROAMATINE) 10 MG tablet Take 1 tablet (10 mg total) by mouth 3 (three) times daily with meals. 90 tablet 0  . pantoprazole (PROTONIX) 40 MG tablet Take 40 mg by mouth every morning.     . polyethylene glycol (MIRALAX / GLYCOLAX) packet Take 17 g by mouth daily as needed for mild constipation.    . risperiDONE (RISPERDAL) 2 MG tablet Take 2 mg by mouth 2 (two) times daily.    . vitamin B-12 100 MCG tablet Take 1 tablet (100 mcg total) by mouth daily. 20 tablet 0  . clonazePAM (KLONOPIN) 0.5 MG tablet Take  1 tablet (0.5 mg total) by mouth at bedtime. (Patient not taking: Reported on 08/01/2016) 30 tablet 0  . feeding supplement, ENSURE ENLIVE, (ENSURE ENLIVE) LIQD Take 237 mLs by mouth 2 (two) times daily between meals. (Patient not taking: Reported on 08/01/2016) 237 mL 12  . hydrocortisone (CORTEF) 10 MG tablet Take 1 tablet (10 mg total) by mouth daily with breakfast. Please give at 7am every morning (Patient not taking: Reported on 07/21/2016) 30 tablet 0    Allergies:  Allergies  Allergen Reactions  . Pineapple Anaphylaxis    Family History  Problem Relation Age of Onset  . Family history unknown: Yes    Social History:  reports  that he quit smoking about 6 years ago. He smoked 0.50 packs per day. He quit smokeless tobacco use about 15 years ago. He reports that he does not drink alcohol or use drugs.  Review of Systems: negative except As above   Blood pressure 100/71, pulse 73, temperature 97.4 F (36.3 C), temperature source Oral, resp. rate 18, height 6' 3"  (1.905 m), weight 81.6 kg (180 lb), SpO2 100 %. Head: Normocephalic, without obvious abnormality, atraumatic Neck: no adenopathy, no carotid bruit, no JVD, supple, symmetrical, trachea midline and thyroid not enlarged, symmetric, no tenderness/mass/nodules Resp: clear to auscultation bilaterally Cardio: regular rate and rhythm, S1, S2 normal, no murmur, click, rub or gallop GI: Abdomen soft slightly distended, nontender Extremities: extremities normal, atraumatic, no cyanosis or edema  Results for orders placed or performed during the hospital encounter of 08/01/16 (from the past 48 hour(s))  POC occult blood, ED     Status: Abnormal   Collection Time: 08/01/16 11:09 AM  Result Value Ref Range   Fecal Occult Bld POSITIVE (A) NEGATIVE  Comprehensive metabolic panel     Status: Abnormal   Collection Time: 08/01/16 11:32 AM  Result Value Ref Range   Sodium 141 135 - 145 mmol/L   Potassium 3.7 3.5 - 5.1 mmol/L   Chloride 103 101 - 111 mmol/L   CO2 33 (H) 22 - 32 mmol/L   Glucose, Bld 111 (H) 65 - 99 mg/dL   BUN 15 6 - 20 mg/dL   Creatinine, Ser 0.89 0.61 - 1.24 mg/dL   Calcium 8.5 (L) 8.9 - 10.3 mg/dL   Total Protein 6.8 6.5 - 8.1 g/dL   Albumin 2.6 (L) 3.5 - 5.0 g/dL   AST 29 15 - 41 U/L   ALT 27 17 - 63 U/L   Alkaline Phosphatase 105 38 - 126 U/L   Total Bilirubin 0.8 0.3 - 1.2 mg/dL   GFR calc non Af Amer >60 >60 mL/min   GFR calc Af Amer >60 >60 mL/min    Comment: (NOTE) The eGFR has been calculated using the CKD EPI equation. This calculation has not been validated in all clinical situations. eGFR's persistently <60 mL/min signify possible  Chronic Kidney Disease.    Anion gap 5 5 - 15  CBC     Status: Abnormal   Collection Time: 08/01/16 11:32 AM  Result Value Ref Range   WBC 4.1 4.0 - 10.5 K/uL   RBC 3.58 (L) 4.22 - 5.81 MIL/uL   Hemoglobin 8.2 (L) 13.0 - 17.0 g/dL   HCT 29.5 (L) 39.0 - 52.0 %   MCV 82.4 78.0 - 100.0 fL   MCH 22.9 (L) 26.0 - 34.0 pg   MCHC 27.8 (L) 30.0 - 36.0 g/dL   RDW 29.6 (H) 11.5 - 15.5 %   Platelets 483 (H) 150 -  400 K/uL  Type and screen [place]     Status: None (Preliminary result)   Collection Time: 08/01/16 11:32 AM  Result Value Ref Range   ABO/RH(D) O POS    Antibody Screen NEG    Sample Expiration 08/04/2016    Unit Number [id number]    Blood Component Type RED CELLS,LR    Unit division 00    Status of Unit ALLOCATED    Transfusion Status OK TO TRANSFUSE    Crossmatch Result Compatible   Lipase, blood     Status: None   Collection Time: 08/01/16 11:32 AM  Result Value Ref Range   Lipase 35 11 - 51 U/L  Urinalysis, Routine w reflex microscopic     Status: Abnormal   Collection Time: 08/01/16 11:32 AM  Result Value Ref Range   Color, Urine YELLOW YELLOW   APPearance CLEAR CLEAR   Specific Gravity, Urine 1.011 1.005 - 1.030   pH 9.0 (H) 5.0 - 8.0   Glucose, UA NEGATIVE NEGATIVE mg/dL   Hgb urine dipstick NEGATIVE NEGATIVE   Bilirubin Urine NEGATIVE NEGATIVE   Ketones, ur NEGATIVE NEGATIVE mg/dL   Protein, ur NEGATIVE NEGATIVE mg/dL   Nitrite NEGATIVE NEGATIVE   Leukocytes, UA NEGATIVE NEGATIVE   No results found.  Assessment: Reported hematemesis with heme-positive stools and recent moderate drops in hemoglobin in a patient with reported history of peptic ulcer disease and possible surgery. Plan:  Will perform EGD during hospitalization, probably tomorrow depending on scheduling. We'll hold nothing by mouth after midnight. Vallery Mcdade C 08/01/2016, 4:24 PM  Pager [phone] If no answer or after 5 PM call [phone]

## 2016-08-01 NOTE — ED Notes (Signed)
Hospitalist at the bedside 

## 2016-08-01 NOTE — ED Provider Notes (Signed)
[facility] DEPT Provider Note   CSN: [id number] Arrival date & time: 08/01/16  1045     History   Chief Complaint Chief Complaint  Patient presents with  . Rectal Bleeding  . Hematemesis    HPI [name] is a 51 y.o. male.  The history is provided by the patient and a caregiver. No language interpreter was used.  Rectal Bleeding    [name] is a 51 y.o. male who presents to the Emergency Department complaining of rectal bleeding, vomiting.  He presents with his caretaker for evaluation of vomiting and rectal bleeding. His overnight caretaker reported that he had 2 episodes of vomiting with 2 bloody stools. The daytime  caretaker called his PCP who recommended referral to the emergency department. He reports 2 episodes of vomiting with one large bloody stool and one stool with a little bit of blood. He denies any fevers, chest pain, shortness of breath, abdominal pain and states that he currently feels well. He does have a history of anemia and GI bleeding with recent blood transfusions for same.  Past Medical History:  Diagnosis Date  . Anxiety   . Bipolar disorder ([place])   . BPH (benign prostatic hyperplasia)   . Closed head injury   . Constipation   . Depression   . Esophageal ulcer   . Malnutrition ([place])   . 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 Peptic ulcer disease   . Seizures ([place])   . Smoker     Patient Active Problem List   Diagnosis Date Noted  . GI bleed 07/22/2016  . Thrombocytosis ([place]) 07/22/2016  . Acute blood loss anemia   . Ulcerative esophagitis   . Symptomatic anemia 07/21/2016  . Adrenal insufficiency (Addison's disease) ([place]) 06/13/2016  . Bipolar I disorder ([place]) 06/13/2016  . Allergic rhinitis 06/13/2016  . Slow transit constipation 06/13/2016  . Pneumothorax, right   . HAP (hospital-acquired pneumonia)   . Bilateral pulmonary infiltrates on chest  x-ray   . Arterial hypotension   . HCAP (healthcare-associated pneumonia)   . Acute respiratory failure with hypoxemia ([place]) 04/30/2016  . Anemia, iron deficiency 04/25/2016  . Altered mental status   . Aspiration pneumonia of right lower lobe due to gastric secretions ([place])   . Sepsis ([place]) 04/23/2016  . Pressure injury of skin 04/23/2016  . Community acquired pneumonia of right lower lobe of lung ([place]) 04/22/2016  . Absolute anemia 02/22/2015  . Closed head injury 02/22/2015  . 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 Anxiety state 02/22/2015  . 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 Depression 03/28/2007  . GERD with esophagitis 02/24/2007  . 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 ESOPHAGOGASTRODUODENOSCOPY  06/17/2011   Procedure: ESOPHAGOGASTRODUODENOSCOPY (EGD);  Surgeon: Wonda Horner, MD;  Location: [facility];  Service: Endoscopy;  Laterality: N/A;  . GASTRECTOMY         Home Medications    Prior to Admission medications   Medication Sig Start Date End Date Taking? Authorizing Provider  benztropine (COGENTIN) 0.5 MG tablet Take 0.5 mg by mouth every morning.     Historical Provider, MD  carbamazepine (TEGRETOL) 200 MG tablet Take 1 tablet (200 mg total) by mouth 3 (three) times daily. 05/17/16   Eber Jones, MD  clonazePAM (KLONOPIN) 0.5 MG tablet Take 1 tablet (0.5 mg total) by mouth at  bedtime. 07/26/16   Belkys A Regalado, MD  escitalopram (LEXAPRO) 10 MG tablet Take 10 mg by mouth every morning.     Historical Provider, MD  feeding supplement, ENSURE ENLIVE, (ENSURE ENLIVE) LIQD Take 237 mLs by mouth 2 (two) times daily between meals. 07/26/16   Belkys A Regalado, MD  ferrous sulfate 325 (65 FE) MG tablet Take 1 tablet (325 mg total) by mouth 2 (two) times daily with a meal. 07/26/16   Belkys A Regalado, MD  hydrocortisone (CORTEF) 10 MG tablet Take 1 tablet (10 mg total) by mouth daily with  breakfast. Please give at 7am every morning Patient not taking: Reported on 07/21/2016 05/18/16   Eber Jones, MD  hydrocortisone (CORTEF) 5 MG tablet Take 1 tablet (5 mg total) by mouth daily at 12 noon. Patient taking differently: Take 5-10 mg by mouth 2 (two) times daily. Take 10 mg every morning and 5 mg every evening 05/18/16   Eber Jones, MD  lamoTRIgine (LAMICTAL) 100 MG tablet Take 1 tablet (100 mg total) by mouth 2 (two) times daily. 05/17/16   Eber Jones, MD  midodrine (PROAMATINE) 10 MG tablet Take 1 tablet (10 mg total) by mouth 3 (three) times daily with meals. 05/17/16   Eber Jones, MD  pantoprazole (PROTONIX) 40 MG tablet Take 40 mg by mouth every morning.     Historical Provider, MD  risperiDONE (RISPERDAL) 2 MG tablet Take 2 mg by mouth 2 (two) times daily.    Historical Provider, MD  vitamin B-12 100 MCG tablet Take 1 tablet (100 mcg total) by mouth daily. 07/27/16   Elmarie Shiley, MD    Family History Family History  Problem Relation Age of Onset  . Family history unknown: Yes    Social History Social History  Substance Use Topics  . Smoking status: Former Smoker    Packs/day: 0.50    Quit date: 05/26/2010  . Smokeless tobacco: Former Systems developer    Quit date: 06/08/2001  . Alcohol use No     Allergies   Pineapple   Review of Systems Review of Systems  Gastrointestinal: Positive for hematochezia.  All other systems reviewed and are negative.    Physical Exam Updated Vital Signs BP 110/57 (BP Location: Left Arm)   Pulse 95   Temp 98.2 F (36.8 C) (Oral)   Resp 18   Ht 6\' 3"  (1.905 m)   Wt 180 lb (81.6 kg)   SpO2 98%   BMI 22.50 kg/m   Physical Exam  Constitutional: He appears well-developed and well-nourished.  HENT:  Head: Normocephalic and atraumatic.  Cardiovascular: Normal rate and regular rhythm.   No murmur heard. Pulmonary/Chest: Effort normal and breath sounds normal. No respiratory distress.    Abdominal: Soft. There is no tenderness. There is no rebound and no guarding.  Genitourinary:  Genitourinary Comments: Nontender brown stool  Musculoskeletal: He exhibits no tenderness.  1+ pitting edema to bilateral lower extremities  Neurological: He is alert.  Cognitive delay  Skin: Skin is warm and dry. There is pallor.  Psychiatric: He has a normal mood and affect. His behavior is normal.  Nursing note and vitals reviewed.    ED Treatments / Results  Labs (all labs ordered are listed, but only abnormal results are displayed) Labs Reviewed  COMPREHENSIVE METABOLIC PANEL - Abnormal; Notable for the following:       Result Value   CO2 33 (*)    Glucose, Bld 111 (*)    Calcium  8.5 (*)    Albumin 2.6 (*)    All other components within normal limits  CBC - Abnormal; Notable for the following:    RBC 3.58 (*)    Hemoglobin 8.2 (*)    HCT 29.5 (*)    MCH 22.9 (*)    MCHC 27.8 (*)    RDW 29.6 (*)    Platelets 483 (*)    All other components within normal limits  URINALYSIS, ROUTINE W REFLEX MICROSCOPIC - Abnormal; Notable for the following:    pH 9.0 (*)    All other components within normal limits  POC OCCULT BLOOD, ED - Abnormal; Notable for the following:    Fecal Occult Bld POSITIVE (*)    All other components within normal limits  LIPASE, BLOOD  TYPE AND SCREEN    EKG  EKG Interpretation None       Radiology No results found.  Procedures Procedures (including critical care time)  Medications Ordered in ED Medications  pantoprazole (PROTONIX) injection 40 mg (not administered)     Initial Impression / Assessment and Plan / ED Course  I have reviewed the triage vital signs and the nursing notes.  Pertinent labs & imaging results that were available during my care of the patient were reviewed by me and considered in my medical decision making (see chart for details).     Pt with hx/o anemia and GI bleed here following episodes of bloody emesis and  stools at home overnight.  History is limited given cognitive delay and there is limited description of amount of blood observed at home available. Hemoglobin is decreased compared to prior 6 days ago. Patient is hemodynamically stable in the emergency department. Discussed with Dr. Oletta Lamas with GI who will see the patient in consult. Discussed with hospitalist regarding admission given concern for possible GI bleed.  Final Clinical Impressions(s) / ED Diagnoses   Final diagnoses:  Upper GI bleed    New Prescriptions New Prescriptions   No medications on file     Quintella Reichert, MD 08/01/16 1400

## 2016-08-01 NOTE — ED Triage Notes (Signed)
Patient had blood in stool and emesis starting last night per the day program that he stays at during the day. ED provider at bedside.

## 2016-08-01 NOTE — H&P (Signed)
History and Physical    [name] IOX:[id number] DOB: 1966-04-12 DOA: 08/01/2016  PCP: [name], MD   I have briefly reviewed patients previous medical reports in [facility].  Patient coming from: Home  Chief Complaint: Vomiting blood and blood in stools.  HPI: [name] is a 51 year old male, lives at home, has 24/7 caregivers, able to ambulate but with assistance, PMH of TBI, mental retardation, seizure disorder, adrenal insufficiency, spastic quadriplegia, chronic anemia, status post gastrectomy, anxiety/depression/bipolar disorder, recently hospitalized 07/21/16-07/26/16 for worsening anemia (hemoglobin 6), possible aspiration pneumonia and was transfused 3 units of PRBCs with improvement in hemoglobin, GI was consulted but no procedures were performed and treated with antibiotics for pneumonia, presented to [facility] long ED on 08/01/16 with history of vomiting blood and blood in stools overnight. Patient is a poor historian secondary to chronically altered mental status. He does state that he vomited 3 overnight consisting of bright red blood and volunteers to clots also when asked. Denies abdominal pain. States that he had an episode of black stools (however EDP noted brown stools on rectal exam). That day caregiver at bedside indicates that the nighttime caregiver reported to the agency that patient had an episode of vomiting blood and blood in stools but no further details available. She has been with the patient since 7:00 this morning and he has had no further episodes of emesis or blood in stools. Currently patient denies any complaints and is asking for something to eat. Denies pain, dyspnea.  ED Course: Hemodynamically stable. Lab work shows hemoglobin that has dropped from 9.2 g on 07/26/16-8.2, BMP unremarkable. Rectal exam by EDP showed brown stools but FOBT positive. EDP discussed with [facility] who will formally consult. Hospitalist admission was requested.  Review of Systems:   All other systems reviewed and apart from HPI, are negative.  Past Medical History:  Diagnosis Date  . Anxiety   . Bipolar disorder ([place])   . BPH (benign prostatic hyperplasia)   . Closed head injury   . Constipation   . Depression   . Esophageal ulcer   . Malnutrition ([place])   . 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 Peptic ulcer disease   . Seizures ([place])   . Smoker     Past Surgical History:  Procedure Laterality Date  . ESOPHAGOGASTRODUODENOSCOPY  06/17/2011   Procedure: ESOPHAGOGASTRODUODENOSCOPY (EGD);  Surgeon: Wonda Horner, MD;  Location: [facility];  Service: Endoscopy;  Laterality: N/A;  . GASTRECTOMY      Social History  reports that he quit smoking about 6 years ago. He smoked 0.50 packs per day. He quit smokeless tobacco use about 15 years ago. He reports that he does not drink alcohol or use drugs.  Allergies  Allergen Reactions  . Pineapple Anaphylaxis    Family History  Problem Relation Age of Onset  . Family history unknown: Yes     Prior to Admission medications   Medication Sig Start Date End Date Taking? Authorizing Provider  amoxicillin-clavulanate (AUGMENTIN) 875-125 MG tablet Take 1 tablet by mouth 2 (two) times daily. Started 03/03 for unknown amount of days   Yes Historical Provider, MD  benztropine (COGENTIN) 0.5 MG tablet Take 0.5 mg by mouth every morning.    Yes Historical Provider, MD  carbamazepine (TEGRETOL) 200 MG tablet Take 1 tablet (200 mg total) by mouth 3 (three) times daily. 05/17/16  Yes Eber Jones, MD  escitalopram (LEXAPRO) 10 MG tablet Take 10 mg by mouth every morning.    Yes Historical Provider, MD  ferrous sulfate 325 (65 FE) MG tablet Take 1 tablet (325 mg total) by mouth 2 (two) times daily with a meal. 07/26/16  Yes Belkys A Regalado, MD  hydrocortisone (CORTEF) 5 MG tablet Take 1 tablet (5 mg total) by mouth daily at 12  noon. Patient taking differently: Take 5-10 mg by mouth 2 (two) times daily. Take 10 mg every morning and 5 mg every evening 05/18/16  Yes Eber Jones, MD  lamoTRIgine (LAMICTAL) 100 MG tablet Take 1 tablet (100 mg total) by mouth 2 (two) times daily. 05/17/16  Yes Eber Jones, MD  midodrine (PROAMATINE) 10 MG tablet Take 1 tablet (10 mg total) by mouth 3 (three) times daily with meals. 05/17/16  Yes Eber Jones, MD  pantoprazole (PROTONIX) 40 MG tablet Take 40 mg by mouth every morning.    Yes Historical Provider, MD  polyethylene glycol (MIRALAX / GLYCOLAX) packet Take 17 g by mouth daily as needed for mild constipation.   Yes Historical Provider, MD  risperiDONE (RISPERDAL) 2 MG tablet Take 2 mg by mouth 2 (two) times daily.   Yes Historical Provider, MD  vitamin B-12 100 MCG tablet Take 1 tablet (100 mcg total) by mouth daily. 07/27/16  Yes Belkys A Regalado, MD  clonazePAM (KLONOPIN) 0.5 MG tablet Take 1 tablet (0.5 mg total) by mouth at bedtime. Patient not taking: Reported on 08/01/2016 07/26/16   Belkys A Regalado, MD  feeding supplement, ENSURE ENLIVE, (ENSURE ENLIVE) LIQD Take 237 mLs by mouth 2 (two) times daily between meals. Patient not taking: Reported on 08/01/2016 07/26/16   Belkys A Regalado, MD  hydrocortisone (CORTEF) 10 MG tablet Take 1 tablet (10 mg total) by mouth daily with breakfast. Please give at 7am every morning Patient not taking: Reported on 07/21/2016 05/18/16   Eber Jones, MD    Physical Exam: Vitals:   08/01/16 1053 08/01/16 1418 08/01/16 1432 08/01/16 1519  BP:  103/72 103/72 100/71  Pulse:   67 73  Resp:  18 12 18   Temp:    97.4 F (36.3 C)  TempSrc:    Oral  SpO2:  98% 100% 100%  Weight: 81.6 kg (180 lb)     Height: 6\' 3"  (1.905 m)         Constitutional: Pleasant young male, moderately built and nourished, cognitively impaired, lying comfortably supine in bed in the ED without distress. Eyes: PERTLA, lids and conjunctivae  normal ENMT: Mucous membranes are moist. Posterior pharynx clear of any exudate or lesions. Normal dentition. No blood noted in mouth or posterior pharyngeal wall. Neck: supple, no masses, no thyromegaly Respiratory: clear to auscultation bilaterally, no wheezing, no crackles. Normal respiratory effort. No accessory muscle use.  Cardiovascular: S1 & S2 heard, regular rate and rhythm, no murmurs / rubs / gallops. Trace ankle edema. 2+ pedal pulses. No carotid bruits. Telemetry: Sinus rhythm. Abdomen: No distension, no tenderness, no masses palpated. No hepatosplenomegaly. Bowel sounds normal.  Musculoskeletal: no clubbing / cyanosis. No joint deformity upper and lower extremities. Moves all extremities symmetrically. Normal muscle tone.  Skin: no rashes, lesions, ulcers. No induration Neurologic: CN 2-12 grossly intact. Sensation intact, DTR normal. Strength 5/5 in all 4 limbs.  Psychiatric: Impaired judgment and insight. Alert and oriented x 2. Normal mood.     Labs on Admission: I have personally reviewed following labs and imaging studies  CBC:  Recent Labs Lab 07/26/16 0547 08/01/16 1132  WBC 7.8 4.1  HGB 9.2* 8.2*  HCT 31.9* 29.5*  MCV 76.7* 82.4  PLT 403* 814*   Basic Metabolic Panel:  Recent Labs Lab 07/26/16 0547 08/01/16 1132  NA 137 141  K 3.8 3.7  CL 106 103  CO2 24 33*  GLUCOSE 119* 111*  BUN 17 15  CREATININE 1.07 0.89  CALCIUM 8.2* 8.5*   Liver Function Tests:  Recent Labs Lab 08/01/16 1132  AST 29  ALT 27  ALKPHOS 105  BILITOT 0.8  PROT 6.8  ALBUMIN 2.6*   Urine analysis:    Component Value Date/Time   COLORURINE YELLOW 08/01/2016 1132   APPEARANCEUR CLEAR 08/01/2016 1132   LABSPEC 1.011 08/01/2016 1132   PHURINE 9.0 (H) 08/01/2016 1132   GLUCOSEU NEGATIVE 08/01/2016 1132   HGBUR NEGATIVE 08/01/2016 1132   HGBUR negative 07/31/2009 1526   BILIRUBINUR NEGATIVE 08/01/2016 1132   KETONESUR NEGATIVE 08/01/2016 1132   PROTEINUR NEGATIVE  08/01/2016 1132   UROBILINOGEN 0.2 09/11/2014 1951   NITRITE NEGATIVE 08/01/2016 1132   LEUKOCYTESUR NEGATIVE 08/01/2016 1132     Radiological Exams on Admission: No results found.    Assessment/Plan Principal Problem:   Acute GI bleeding Active Problems:   Generalized convulsive epilepsy ([place])   GERD with esophagitis   Absolute anemia   Mental retardation, mild (I.Q. 50-70)   Adrenal insufficiency (Addison's disease) ([place])     1. Acute GI bleed: Unfortunately inadequate history. As per rectal exam by EDP, brown stools but FOBT positive. No further episodes of vomiting blood or blood in stools since 7 AM. Hemodynamically stable.? Upper GI bleed. Clear liquids. IV PPI. Monitor for further episodes of bloody emesis or blood in stools. [facility] consulted and we'll await formal recommendations. Does have history of esophagitis in the past. 2. Chronic anemia: Mild drop in hemoglobin from recent admission from 9.2 > 8.2. Follow CBCs closely and transfuse if hemoglobin <8 g per DL. 3. TBI/mental retardation/seizure disorder: Continue home medications and seizure precautions. Unknown duration of last seizure. 4. Adrenal insufficiency: Stable vital signs. Continue steroid supplementation. 5. Anxiety/depression/bipolar disorder: Continue home medications. Stable. 6. Recent aspiration pneumonia: Complete course of Augmentin.   DVT prophylaxis: SCDs  Code Status: Full code  Family Communication: Discussed with caregiver in detail at bedside.  Disposition Plan: DC home when medically improved and stable.  Consults called: EDP has consulted [facility]/Dr. Oletta Lamas and will await formal recommendations.  Admission status: Telemetry, observation.    [facility] MD [facility] Pager [phone]  If 7PM-7AM, please contact night-coverage [email or web address] Password [facility]  08/01/2016, 3:53 PM

## 2016-08-01 NOTE — ED Notes (Signed)
Pt and care giver have been made aware of urine sample. Urinal in hand.

## 2016-08-02 ENCOUNTER — Encounter (HOSPITAL_COMMUNITY)
Admission: EM | Disposition: A | Discharge status: Home / Self Care | Payer: Self-pay | Source: Home / Self Care | Attending: Internal Medicine

## 2016-08-02 ENCOUNTER — Encounter (HOSPITAL_COMMUNITY): Payer: Self-pay

## 2016-08-02 DIAGNOSIS — F419 Anxiety disorder, unspecified: Secondary | ICD-10-CM | POA: Diagnosis present

## 2016-08-02 DIAGNOSIS — K922 Gastrointestinal hemorrhage, unspecified: Secondary | ICD-10-CM | POA: Diagnosis present

## 2016-08-02 DIAGNOSIS — E271 Primary adrenocortical insufficiency: Secondary | ICD-10-CM | POA: Diagnosis present

## 2016-08-02 DIAGNOSIS — F70 Mild intellectual disabilities: Secondary | ICD-10-CM | POA: Diagnosis present

## 2016-08-02 DIAGNOSIS — F319 Bipolar disorder, unspecified: Secondary | ICD-10-CM | POA: Diagnosis present

## 2016-08-02 DIAGNOSIS — K210 Gastro-esophageal reflux disease with esophagitis: Secondary | ICD-10-CM | POA: Diagnosis present

## 2016-08-02 DIAGNOSIS — D62 Acute posthemorrhagic anemia: Secondary | ICD-10-CM | POA: Diagnosis present

## 2016-08-02 DIAGNOSIS — Z87820 Personal history of traumatic brain injury: Secondary | ICD-10-CM | POA: Diagnosis not present

## 2016-08-02 DIAGNOSIS — K920 Hematemesis: Secondary | ICD-10-CM | POA: Diagnosis present

## 2016-08-02 DIAGNOSIS — J690 Pneumonitis due to inhalation of food and vomit: Secondary | ICD-10-CM | POA: Diagnosis present

## 2016-08-02 DIAGNOSIS — G40409 Other generalized epilepsy and epileptic syndromes, not intractable, without status epilepticus: Secondary | ICD-10-CM | POA: Diagnosis present

## 2016-08-02 DIAGNOSIS — Z79899 Other long term (current) drug therapy: Secondary | ICD-10-CM | POA: Diagnosis not present

## 2016-08-02 DIAGNOSIS — Z87891 Personal history of nicotine dependence: Secondary | ICD-10-CM | POA: Diagnosis not present

## 2016-08-02 DIAGNOSIS — Z903 Acquired absence of stomach [part of]: Secondary | ICD-10-CM | POA: Diagnosis not present

## 2016-08-02 HISTORY — PX: ESOPHAGOGASTRODUODENOSCOPY: SHX5428

## 2016-08-02 LAB — CBC
HEMATOCRIT: 26.8 % — AB (ref 39.0–52.0)
Hemoglobin: 7.6 g/dL — ABNORMAL LOW (ref 13.0–17.0)
MCH: 23.2 pg — AB (ref 26.0–34.0)
MCHC: 28.4 g/dL — ABNORMAL LOW (ref 30.0–36.0)
MCV: 81.7 fL (ref 78.0–100.0)
PLATELETS: 473 10*3/uL — AB (ref 150–400)
RBC: 3.28 MIL/uL — ABNORMAL LOW (ref 4.22–5.81)
RDW: 30.6 % — AB (ref 11.5–15.5)
WBC: 3.2 10*3/uL — ABNORMAL LOW (ref 4.0–10.5)

## 2016-08-02 LAB — GLUCOSE, CAPILLARY: Glucose-Capillary: 79 mg/dL (ref 65–99)

## 2016-08-02 LAB — PREPARE RBC (CROSSMATCH)

## 2016-08-02 SURGERY — EGD (ESOPHAGOGASTRODUODENOSCOPY)
Anesthesia: Moderate Sedation | Laterality: Left

## 2016-08-02 MED ORDER — FENTANYL CITRATE (PF) 100 MCG/2ML IJ SOLN
INTRAMUSCULAR | Status: DC | PRN
  Administered 2016-08-02: 25 ug via INTRAVENOUS

## 2016-08-02 MED ORDER — SODIUM CHLORIDE 0.9 % IV SOLN
Freq: Once | INTRAVENOUS | Status: AC
  Administered 2016-08-02: 12:00:00 via INTRAVENOUS

## 2016-08-02 MED ORDER — BUTAMBEN-TETRACAINE-BENZOCAINE 2-2-14 % EX AERO
INHALATION_SPRAY | CUTANEOUS | Status: DC | PRN
  Administered 2016-08-02: 1 via TOPICAL

## 2016-08-02 MED ORDER — SODIUM CHLORIDE 0.9 % IV SOLN
INTRAVENOUS | Status: DC
  Administered 2016-08-02: 500 mL via INTRAVENOUS

## 2016-08-02 MED ORDER — FENTANYL CITRATE (PF) 100 MCG/2ML IJ SOLN
INTRAMUSCULAR | Status: AC
  Filled 2016-08-02: qty 2

## 2016-08-02 MED ORDER — DIPHENHYDRAMINE HCL 50 MG/ML IJ SOLN
INTRAMUSCULAR | Status: AC
  Filled 2016-08-02: qty 1

## 2016-08-02 MED ORDER — MIDAZOLAM HCL 5 MG/ML IJ SOLN
INTRAMUSCULAR | Status: AC
  Filled 2016-08-02: qty 2

## 2016-08-02 MED ORDER — MIDAZOLAM HCL 10 MG/2ML IJ SOLN
INTRAMUSCULAR | Status: DC | PRN
  Administered 2016-08-02: 2 mg via INTRAVENOUS
  Administered 2016-08-02: 1 mg via INTRAVENOUS

## 2016-08-02 NOTE — H&P (View-Only) (Signed)
PROGRESS NOTE  ATTHEW Stephens HEN:[id number] DOB: 14-Oct-1965 DOA: 08/01/2016 PCP: [name], MD   LOS: 0 days   Brief Narrative: [name] is a 51 year old male, lives at home, has 24/7 caregivers, able to ambulate but with assistance, PMH of TBI, mental retardation, seizure disorder, adrenal insufficiency, spastic quadriplegia, chronic anemia, status post gastrectomy, anxiety/depression/bipolar disorder, recently hospitalized 07/21/16-07/26/16 for worsening anemia (hemoglobin 6), possible aspiration pneumonia and was transfused 3 units of PRBCs with improvement in hemoglobin, GI was consulted but no procedures were performed and treated with antibiotics for pneumonia, presented to [facility] long ED on 08/01/16 with history of vomiting blood and blood in stools overnight. Patient is a poor historian secondary to chronically altered mental status.   Assessment & Plan: Principal Problem:   Acute GI bleeding Active Problems:   Generalized convulsive epilepsy ([place])   GERD with esophagitis   Absolute anemia   Mental retardation, mild (I.Q. 50-70)   Adrenal insufficiency (Addison's disease) ([facility])   Acute GI bleed  -In medical history, he was fecal occult positive and recently had a hospitalization for anemia -GI consulted, plan for EGD today -Hemoglobin decreased further today to 7 range, will transfuse 1 unit of packed red blood cells  Chronic anemia /acute blood loss anemia -Has a degree of chronic anemia, however worse now due to GI bleed.  Transfuse 1 unit of packed red blood cells today.  TBI/mental retardation/seizure disorder -Continue home medications and seizure precautions. Unknown duration of last seizure.  Adrenal insufficiency -Stable vital signs. Continue steroid supplementation.  Anxiety/depression/bipolar disorder -Continue home medications. Stable.  Recent aspiration pneumonia -complete course of Augmentin.   DVT prophylaxis: SCDs Code Status: Full code Family  Communication: no family bedside Disposition Plan: home when ready  Consultants:   GI  Procedures:   None   Antimicrobials:  None    Subjective: -Has no complaints this morning, no abdominal pain, nausea or vomiting.  He is a poor historian.  Objective: Vitals:   08/02/16 0503 08/02/16 1131 08/02/16 1156 08/02/16 1250  BP: 110/71 100/71 106/75 113/70  Pulse: 81 79 71 89  Resp: 18 20 20 20   Temp: 98.8 F (37.1 C) 98.1 F (36.7 C) 98.4 F (36.9 C) 98.1 F (36.7 C)  TempSrc: Oral Oral Oral Oral  SpO2: 97% 96% 97% 95%  Weight:      Height:        Intake/Output Summary (Last 24 hours) at 08/02/16 1323 Last data filed at 08/02/16 1250  Gross per 24 hour  Intake           1054.5 ml  Output             3075 ml  Net          -2020.5 ml   Filed Weights   08/01/16 1053  Weight: 81.6 kg (180 lb)    Examination: Constitutional: NAD Vitals:   08/02/16 0503 08/02/16 1131 08/02/16 1156 08/02/16 1250  BP: 110/71 100/71 106/75 113/70  Pulse: 81 79 71 89  Resp: 18 20 20 20   Temp: 98.8 F (37.1 C) 98.1 F (36.7 C) 98.4 F (36.9 C) 98.1 F (36.7 C)  TempSrc: Oral Oral Oral Oral  SpO2: 97% 96% 97% 95%  Weight:      Height:       Eyes: PERRL, lids and conjunctivae normal Respiratory: clear to auscultation bilaterally, no wheezing, no crackles.  Cardiovascular: Regular rate and rhythm, no murmurs / rubs / gallops.  Abdomen: no tenderness. Bowel sounds  positive.  Musculoskeletal: no clubbing / cyanosis.   Neurologic: non focal   Data Reviewed: I have personally reviewed following labs and imaging studies  CBC:  Recent Labs Lab 08/01/16 1132 08/01/16 1650 08/02/16 0648  WBC 4.1 3.6* 3.2*  HGB 8.2* 8.2* 7.6*  HCT 29.5* 29.9* 26.8*  MCV 82.4 81.3 81.7  PLT 483* 531* 762*   Basic Metabolic Panel:  Recent Labs Lab 08/01/16 1132  NA 141  K 3.7  CL 103  CO2 33*  GLUCOSE 111*  BUN 15  CREATININE 0.89  CALCIUM 8.5*   GFR: Estimated Creatinine  Clearance: 115.9 mL/min (by C-G formula based on SCr of 0.89 mg/dL). Liver Function Tests:  Recent Labs Lab 08/01/16 1132  AST 29  ALT 27  ALKPHOS 105  BILITOT 0.8  PROT 6.8  ALBUMIN 2.6*    Recent Labs Lab 08/01/16 1132  LIPASE 35   No results for input(s): AMMONIA in the last 168 hours. Coagulation Profile: No results for input(s): INR, PROTIME in the last 168 hours. Cardiac Enzymes: No results for input(s): CKTOTAL, CKMB, CKMBINDEX, TROPONINI in the last 168 hours. BNP (last 3 results) No results for input(s): PROBNP in the last 8760 hours. HbA1C: No results for input(s): HGBA1C in the last 72 hours. CBG:  Recent Labs Lab 08/02/16 0758  GLUCAP 79   Lipid Profile: No results for input(s): CHOL, HDL, LDLCALC, TRIG, CHOLHDL, LDLDIRECT in the last 72 hours. Thyroid Function Tests: No results for input(s): TSH, T4TOTAL, FREET4, T3FREE, THYROIDAB in the last 72 hours. Anemia Panel: No results for input(s): VITAMINB12, FOLATE, FERRITIN, TIBC, IRON, RETICCTPCT in the last 72 hours. Urine analysis:    Component Value Date/Time   COLORURINE YELLOW 08/01/2016 1132   APPEARANCEUR CLEAR 08/01/2016 1132   LABSPEC 1.011 08/01/2016 1132   PHURINE 9.0 (H) 08/01/2016 1132   GLUCOSEU NEGATIVE 08/01/2016 1132   HGBUR NEGATIVE 08/01/2016 1132   HGBUR negative 07/31/2009 1526   BILIRUBINUR NEGATIVE 08/01/2016 1132   KETONESUR NEGATIVE 08/01/2016 1132   PROTEINUR NEGATIVE 08/01/2016 1132   UROBILINOGEN 0.2 09/11/2014 1951   NITRITE NEGATIVE 08/01/2016 1132   LEUKOCYTESUR NEGATIVE 08/01/2016 1132   Sepsis Labs: Invalid input(s): PROCALCITONIN, LACTICIDVEN  No results found for this or any previous visit (from the past 240 hour(s)).    Radiology Studies: No results found.   Scheduled Meds: . amoxicillin-clavulanate  1 tablet Oral BID  . benztropine  0.5 mg Oral q morning - 10a  . carbamazepine  200 mg Oral TID  . escitalopram  10 mg Oral q morning - 10a  .  hydrocortisone  10 mg Oral q morning - 10a  . hydrocortisone  5 mg Oral QPM  . lamoTRIgine  100 mg Oral BID  . midodrine  10 mg Oral TID WC  . pantoprazole (PROTONIX) IV  40 mg Intravenous Q12H  . risperiDONE  2 mg Oral BID  . sodium chloride flush  3 mL Intravenous Q12H  . cyanocobalamin  100 mcg Oral Daily   Continuous Infusions:   Marzetta Board, MD, PhD [facility] Pager [phone]  If 7PM-7AM, please contact night-coverage [email or web address] Password [email or web address] 08/02/2016, 1:23 PM

## 2016-08-02 NOTE — Interval H&P Note (Signed)
History and Physical Interval Note:  08/02/2016 2:23 PM  [name]  has presented today for surgery, with the diagnosis of Hematemesis  The various methods of treatment have been discussed with the patient and family. After consideration of risks, benefits and other options for treatment, the patient has consented to  Procedure(s): ESOPHAGOGASTRODUODENOSCOPY (EGD) (Left) as a surgical intervention .  The patient's history has been reviewed, patient examined, no change in status, stable for surgery.  I have reviewed the patient's chart and labs.  Questions were answered to the patient's satisfaction.     Y[facility] C

## 2016-08-02 NOTE — Op Note (Signed)
[facility] Patient Name: [name] Procedure Date: 08/02/2016 MRN: [id number] Attending MD: Missy Sabins , MD Date of Birth: [birth year] CSN: [id number] Age: 51 Admit Type: Inpatient Procedure:                Upper GI endoscopy Indications:              Hematemesis Providers:                Elyse Jarvis. Amedeo Plenty, MD, Cleda Daub, RN, Cherylynn Ridges, Technician Referring MD:              Medicines:                Fentanyl 25 micrograms IV, Midazolam 3 mg IV Complications:            No immediate complications. Estimated Blood Loss:     Estimated blood loss: none. Procedure:                Pre-Anesthesia Assessment:                           - Prior to the procedure, a History and Physical                            was performed, and patient medications and                            allergies were reviewed. The patient's tolerance of                            previous anesthesia was also reviewed. The risks                            and benefits of the procedure and the sedation                            options and risks were discussed with the patient.                            All questions were answered, and informed consent                            was obtained. Prior Anticoagulants: The patient has                            taken no previous anticoagulant or antiplatelet                            agents. ASA Grade Assessment: III - A patient with                            severe systemic disease. After reviewing the risks  and benefits, the patient was deemed in                            satisfactory condition to undergo the procedure.                           After obtaining informed consent, the endoscope was                            passed under direct vision. Throughout the                            procedure, the patient's blood pressure, pulse, and                            oxygen saturations  were monitored continuously. The                            Endoscope was introduced through the mouth, and                            advanced to the second part of duodenum. The upper                            GI endoscopy was accomplished without difficulty.                            The patient tolerated the procedure well. Scope In: Scope Out: Findings:      LA Grade D (one or more mucosal breaks involving at least 75% of       esophageal circumference) esophagitis with no bleeding was found 26 to       36 cm from the incisors.      The entire examined stomach was normal.      The examined duodenum was normal.      There is no endoscopic evidence of bleeding in the entire esophagus. Impression:               - LA Grade D reflux esophagitis.                           - Normal stomach.                           - Normal examined duodenum.                           - No specimens collected. Moderate Sedation:      Moderate (conscious) sedation was administered by the endoscopy nurse       and supervised by the endoscopist. The following parameters were       monitored: oxygen saturation, heart rate, blood pressure, respiratory       rate, EKG, adequacy of pulmonary ventilation, and response to care.       Total physician intraservice time was 4 minutes. Recommendation:           - Advance diet as tolerated.                           -  Use a proton pump inhibitor PO BID for the rest                            of the patient's life.                           - Observe patient's clinical course.                           - Follow an antireflux regimen for the rest of the                            patient's life.                           - Continue present medications. Procedure Code(s):        --- Professional ---                           [phone], Esophagogastroduodenoscopy, flexible,                            transoral; diagnostic, including collection of                             specimen(s) by brushing or washing, when performed                            (separate procedure) Diagnosis Code(s):        --- Professional ---                           K21.0, Gastro-esophageal reflux disease with                            esophagitis                           K92.0, Hematemesis CPT copyright 2016 [employer] Association. All rights reserved. The codes documented in this report are preliminary and upon coder review may  be revised to meet current compliance requirements. Missy Sabins, MD 08/02/2016 2:52:25 PM This report has been signed electronically. Number of Addenda: 0

## 2016-08-02 NOTE — Progress Notes (Signed)
08/02/16  1045  Patient needs consent for EGD and blood transfusion.  Called patient's mother Tarell Schollmeyer, to see if she would be coming in today in order to give consent for EGD and blood transfusion. Mrs Huot stated she would not be able to come in today. Therefore, we gain consent by phone. Consent for EGD and blood transfusion was given by Mrs Liskey to two nurses Tilda Franco, RN and Clement Sayres, RN).

## 2016-08-02 NOTE — Progress Notes (Signed)
PROGRESS NOTE  [name] INO:[id number] DOB: 08/08/1965 DOA: 08/01/2016 PCP: [name], MD   LOS: 0 days   Brief Narrative: [name] is a 51 year old male, lives at home, has 24/7 caregivers, able to ambulate but with assistance, PMH of TBI, mental retardation, seizure disorder, adrenal insufficiency, spastic quadriplegia, chronic anemia, status post gastrectomy, anxiety/depression/bipolar disorder, recently hospitalized 07/21/16-07/26/16 for worsening anemia (hemoglobin 6), possible aspiration pneumonia and was transfused 3 units of PRBCs with improvement in hemoglobin, GI was consulted but no procedures were performed and treated with antibiotics for pneumonia, presented to [facility] long ED on 08/01/16 with history of vomiting blood and blood in stools overnight. Patient is a poor historian secondary to chronically altered mental status.   Assessment & Plan: Principal Problem:   Acute GI bleeding Active Problems:   Generalized convulsive epilepsy ([place])   GERD with esophagitis   Absolute anemia   Mental retardation, mild (I.Q. 50-70)   Adrenal insufficiency (Addison's disease) (HCC)   Acute GI bleed  -In medical history, he was fecal occult positive and recently had a hospitalization for anemia -GI consulted, plan for EGD today -Hemoglobin decreased further today to 7 range, will transfuse 1 unit of packed red blood cells  Chronic anemia /acute blood loss anemia -Has a degree of chronic anemia, however worse now due to GI bleed.  Transfuse 1 unit of packed red blood cells today.  TBI/mental retardation/seizure disorder -Continue home medications and seizure precautions. Unknown duration of last seizure.  Adrenal insufficiency -Stable vital signs. Continue steroid supplementation.  Anxiety/depression/bipolar disorder -Continue home medications. Stable.  Recent aspiration pneumonia -complete course of Augmentin.   DVT prophylaxis: SCDs Code Status: Full code Family  Communication: no family bedside Disposition Plan: home when ready  Consultants:   GI  Procedures:   None   Antimicrobials:  None    Subjective: -Has no complaints this morning, no abdominal pain, nausea or vomiting.  He is a poor historian.  Objective: Vitals:   08/02/16 0503 08/02/16 1131 08/02/16 1156 08/02/16 1250  BP: 110/71 100/71 106/75 113/70  Pulse: 81 79 71 89  Resp: 18 20 20 20   Temp: 98.8 F (37.1 C) 98.1 F (36.7 C) 98.4 F (36.9 C) 98.1 F (36.7 C)  TempSrc: Oral Oral Oral Oral  SpO2: 97% 96% 97% 95%  Weight:      Height:        Intake/Output Summary (Last 24 hours) at 08/02/16 1323 Last data filed at 08/02/16 1250  Gross per 24 hour  Intake           1054.5 ml  Output             3075 ml  Net          -2020.5 ml   Filed Weights   08/01/16 1053  Weight: 81.6 kg (180 lb)    Examination: Constitutional: NAD Vitals:   08/02/16 0503 08/02/16 1131 08/02/16 1156 08/02/16 1250  BP: 110/71 100/71 106/75 113/70  Pulse: 81 79 71 89  Resp: 18 20 20 20   Temp: 98.8 F (37.1 C) 98.1 F (36.7 C) 98.4 F (36.9 C) 98.1 F (36.7 C)  TempSrc: Oral Oral Oral Oral  SpO2: 97% 96% 97% 95%  Weight:      Height:       Eyes: PERRL, lids and conjunctivae normal Respiratory: clear to auscultation bilaterally, no wheezing, no crackles.  Cardiovascular: Regular rate and rhythm, no murmurs / rubs / gallops.  Abdomen: no tenderness. Bowel sounds  positive.  Musculoskeletal: no clubbing / cyanosis.   Neurologic: non focal   Data Reviewed: I have personally reviewed following labs and imaging studies  CBC:  Recent Labs Lab 08/01/16 1132 08/01/16 1650 08/02/16 0648  WBC 4.1 3.6* 3.2*  HGB 8.2* 8.2* 7.6*  HCT 29.5* 29.9* 26.8*  MCV 82.4 81.3 81.7  PLT 483* 531* 948*   Basic Metabolic Panel:  Recent Labs Lab 08/01/16 1132  NA 141  K 3.7  CL 103  CO2 33*  GLUCOSE 111*  BUN 15  CREATININE 0.89  CALCIUM 8.5*   GFR: Estimated Creatinine  Clearance: 115.9 mL/min (by C-G formula based on SCr of 0.89 mg/dL). Liver Function Tests:  Recent Labs Lab 08/01/16 1132  AST 29  ALT 27  ALKPHOS 105  BILITOT 0.8  PROT 6.8  ALBUMIN 2.6*    Recent Labs Lab 08/01/16 1132  LIPASE 35   No results for input(s): AMMONIA in the last 168 hours. Coagulation Profile: No results for input(s): INR, PROTIME in the last 168 hours. Cardiac Enzymes: No results for input(s): CKTOTAL, CKMB, CKMBINDEX, TROPONINI in the last 168 hours. BNP (last 3 results) No results for input(s): PROBNP in the last 8760 hours. HbA1C: No results for input(s): HGBA1C in the last 72 hours. CBG:  Recent Labs Lab 08/02/16 0758  GLUCAP 79   Lipid Profile: No results for input(s): CHOL, HDL, LDLCALC, TRIG, CHOLHDL, LDLDIRECT in the last 72 hours. Thyroid Function Tests: No results for input(s): TSH, T4TOTAL, FREET4, T3FREE, THYROIDAB in the last 72 hours. Anemia Panel: No results for input(s): VITAMINB12, FOLATE, FERRITIN, TIBC, IRON, RETICCTPCT in the last 72 hours. Urine analysis:    Component Value Date/Time   COLORURINE YELLOW 08/01/2016 1132   APPEARANCEUR CLEAR 08/01/2016 1132   LABSPEC 1.011 08/01/2016 1132   PHURINE 9.0 (H) 08/01/2016 1132   GLUCOSEU NEGATIVE 08/01/2016 1132   HGBUR NEGATIVE 08/01/2016 1132   HGBUR negative 07/31/2009 1526   BILIRUBINUR NEGATIVE 08/01/2016 1132   KETONESUR NEGATIVE 08/01/2016 1132   PROTEINUR NEGATIVE 08/01/2016 1132   UROBILINOGEN 0.2 09/11/2014 1951   NITRITE NEGATIVE 08/01/2016 1132   LEUKOCYTESUR NEGATIVE 08/01/2016 1132   Sepsis Labs: Invalid input(s): PROCALCITONIN, LACTICIDVEN  No results found for this or any previous visit (from the past 240 hour(s)).    Radiology Studies: No results found.   Scheduled Meds: . amoxicillin-clavulanate  1 tablet Oral BID  . benztropine  0.5 mg Oral q morning - 10a  . carbamazepine  200 mg Oral TID  . escitalopram  10 mg Oral q morning - 10a  .  hydrocortisone  10 mg Oral q morning - 10a  . hydrocortisone  5 mg Oral QPM  . lamoTRIgine  100 mg Oral BID  . midodrine  10 mg Oral TID WC  . pantoprazole (PROTONIX) IV  40 mg Intravenous Q12H  . risperiDONE  2 mg Oral BID  . sodium chloride flush  3 mL Intravenous Q12H  . cyanocobalamin  100 mcg Oral Daily   Continuous Infusions:   Marzetta Board, MD, PhD [facility] Pager [phone]  If 7PM-7AM, please contact night-coverage [email or web address] Password [email or web address] 08/02/2016, 1:23 PM

## 2016-08-03 ENCOUNTER — Encounter (HOSPITAL_COMMUNITY): Payer: Self-pay | Admitting: Gastroenterology

## 2016-08-03 LAB — BASIC METABOLIC PANEL
ANION GAP: 5 (ref 5–15)
BUN: 15 mg/dL (ref 6–20)
CHLORIDE: 108 mmol/L (ref 101–111)
CO2: 28 mmol/L (ref 22–32)
Calcium: 8.4 mg/dL — ABNORMAL LOW (ref 8.9–10.3)
Creatinine, Ser: 0.94 mg/dL (ref 0.61–1.24)
Glucose, Bld: 85 mg/dL (ref 65–99)
POTASSIUM: 3.9 mmol/L (ref 3.5–5.1)
SODIUM: 141 mmol/L (ref 135–145)

## 2016-08-03 LAB — CBC
HEMATOCRIT: 31.1 % — AB (ref 39.0–52.0)
Hemoglobin: 9.2 g/dL — ABNORMAL LOW (ref 13.0–17.0)
MCH: 24 pg — ABNORMAL LOW (ref 26.0–34.0)
MCHC: 29.6 g/dL — ABNORMAL LOW (ref 30.0–36.0)
MCV: 81.2 fL (ref 78.0–100.0)
Platelets: 504 10*3/uL — ABNORMAL HIGH (ref 150–400)
RBC: 3.83 MIL/uL — AB (ref 4.22–5.81)
RDW: 28.2 % — ABNORMAL HIGH (ref 11.5–15.5)
WBC: 5.8 10*3/uL (ref 4.0–10.5)

## 2016-08-03 MED ORDER — PANTOPRAZOLE SODIUM 40 MG PO TBEC: 40.0000 mg | DELAYED_RELEASE_TABLET | Freq: Two times a day (BID) | ORAL | 1 refills | Status: DC

## 2016-08-03 NOTE — Discharge Summary (Signed)
Physician Discharge Summary  [name] TJQ:[id number] DOB: [birth year] DOA: 08/01/2016  PCP: [name], MD  Admit date: 08/01/2016 Discharge date: 08/03/2016  Admitted From: Group Home Disposition:  Group Home  Recommendations for Outpatient Follow-up:  1. Follow up with PCP in 1-2 weeks 2. Continue PPI twice daily indefinitely  Discharge Condition: stable CODE STATUS: Full code Diet recommendation: regular  HPI: Per Dr. Algis Liming, [name] is a 51 year old male, lives at home, has 24/7 caregivers, able to ambulate but with assistance, PMH of TBI, mental retardation, seizure disorder, adrenal insufficiency, spastic quadriplegia, chronic anemia, status post gastrectomy, anxiety/depression/bipolar disorder, recently hospitalized 07/21/16-07/26/16 for worsening anemia (hemoglobin 6), possible aspiration pneumonia and was transfused 3 units of PRBCs with improvement in hemoglobin, GI was consulted but no procedures were performed and treated with antibiotics for pneumonia, presented to [facility] long ED on 08/01/16 with history of vomiting blood and blood in stools overnight. Patient is a poor historian secondary to chronically altered mental status. He does state that he vomited 3 overnight consisting of bright red blood and volunteers to clots also when asked. Denies abdominal pain. States that he had an episode of black stools (however EDP noted brown stools on rectal exam). That day caregiver at bedside indicates that the nighttime caregiver reported to the agency that patient had an episode of vomiting blood and blood in stools but no further details available. She has been with the patient since 7:00 this morning and he has had no further episodes of emesis or blood in stools. Currently patient denies any complaints and is asking for something to eat. Denies pain, dyspnea.  Hospital Course: Discharge Diagnoses:  Principal Problem:   Acute GI bleeding Active Problems:   Generalized  convulsive epilepsy ([place])   GERD with esophagitis   Absolute anemia   Mental retardation, mild (I.Q. 50-70)   Adrenal insufficiency (Addison's disease) ([place])  Acute GI bleed - patient was admitted to the hospital with hematemesis and tested positive for fecal occult blood. Gastroenterology was consulted and patient underwent an EGD on 3/10 which showed significant esophagitis (see full report below) for which life long PPI was recommended. He was transfused 1U pRBC and his hemoglobin improved adequately and remained stable. His bleeding stopped, he is tolerating a diet and will be discharged back to his group home in stable condition.  Chronic anemia /acute blood loss anemia -Has a degree of chronic anemia, however worse now due to GI bleed.  Transfused 1 unit of packed red blood cells, Hb stable. Please repeat a CBC in 1 week TBI/mental retardation/seizure disorder -Continue home medications Adrenal insufficiency -Stable vital signs. Continue steroid supplementation. Anxiety/depression/bipolar disorder -Continue home medications. Stable. Recent aspiration pneumonia -complete course of Augmentin as originally prescribed.  Discharge Instructions   Allergies as of 08/03/2016      Reactions   Pineapple Anaphylaxis      Medication List    TAKE these medications   amoxicillin-clavulanate 875-125 MG tablet Commonly known as:  AUGMENTIN Take 1 tablet by mouth 2 (two) times daily. Started 03/03 for unknown amount of days   benztropine 0.5 MG tablet Commonly known as:  COGENTIN Take 0.5 mg by mouth every morning.   carbamazepine 200 MG tablet Commonly known as:  TEGRETOL Take 1 tablet (200 mg total) by mouth 3 (three) times daily.   clonazePAM 0.5 MG tablet Commonly known as:  KLONOPIN Take 1 tablet (0.5 mg total) by mouth at bedtime.   cyanocobalamin 100 MCG tablet Take 1  tablet (100 mcg total) by mouth daily.   escitalopram 10 MG tablet Commonly known as:  LEXAPRO Take 10 mg by  mouth every morning.   feeding supplement (ENSURE ENLIVE) Liqd Take 237 mLs by mouth 2 (two) times daily between meals.   ferrous sulfate 325 (65 FE) MG tablet Take 1 tablet (325 mg total) by mouth 2 (two) times daily with a meal.   hydrocortisone 10 MG tablet Commonly known as:  CORTEF Take 1 tablet (10 mg total) by mouth daily with breakfast. Please give at 7am every morning What changed:  Another medication with the same name was changed. Make sure you understand how and when to take each.   hydrocortisone 5 MG tablet Commonly known as:  CORTEF Take 1 tablet (5 mg total) by mouth daily at 12 noon. What changed:  how much to take  when to take this  additional instructions   lamoTRIgine 100 MG tablet Commonly known as:  LAMICTAL Take 1 tablet (100 mg total) by mouth 2 (two) times daily.   midodrine 10 MG tablet Commonly known as:  PROAMATINE Take 1 tablet (10 mg total) by mouth 3 (three) times daily with meals.   pantoprazole 40 MG tablet Commonly known as:  PROTONIX Take 1 tablet (40 mg total) by mouth 2 (two) times daily before a meal. What changed:  when to take this   polyethylene glycol packet Commonly known as:  MIRALAX / GLYCOLAX Take 17 g by mouth daily as needed for mild constipation.   risperiDONE 2 MG tablet Commonly known as:  RISPERDAL Take 2 mg by mouth 2 (two) times daily.      Follow-up Information    ANDY,CAMILLE L, MD. Schedule an appointment as soon as possible for a visit in 2 week(s).   Specialty:  Family Medicine Contact information: [address] [place] [address] [phone]          Allergies  Allergen Reactions  . Pineapple Anaphylaxis   Consultations:  GI  Procedures/Studies:  EGD 3/10 Impression:               - LA Grade D reflux esophagitis.                           - Normal stomach.                           - Normal examined duodenum.                           - No specimens  collected. Moderate Sedation:      Moderate (conscious) sedation was administered by the endoscopy nurse       and supervised by the endoscopist. The following parameters were       monitored: oxygen saturation, heart rate, blood pressure, respiratory       rate, EKG, adequacy of pulmonary ventilation, and response to care.       Total physician intraservice time was 4 minutes. Recommendation:           - Advance diet as tolerated.                           - Use a proton pump inhibitor PO BID for the rest  of the patient's life.                           - Observe patient's clinical course.                           - Follow an antireflux regimen for the rest of the                            patient's life.                           - Continue present medications.  Dg Chest Port 1 View  Result Date: 07/22/2016 CLINICAL DATA:  Fever and tachycardia EXAM: PORTABLE CHEST 1 VIEW COMPARISON:  07/21/2016 FINDINGS: Heart is borderline enlarged but stable. No aortic aneurysm. Minimal atelectasis at the right lung base. No pulmonary consolidations. No pneumothorax or pulmonary edema. Old healed fracture deformity of the right humeral shaft. Left seventh and eighth chronic rib fractures. IMPRESSION: No active disease. Electronically Signed   By: Ashley Royalty M.D.   On: 07/22/2016 23:11   Dg Chest Portable 1 View  Result Date: 07/21/2016 CLINICAL DATA:  Cough. EXAM: PORTABLE CHEST 1 VIEW COMPARISON:  Radiograph 05/12/2016, CT 05/09/2016 FINDINGS: Improved right lung aeration with minimal residual ill-defined suprahilar and right basilar patchy opacity. Stable cardiomegaly and mediastinal contours. Mild interstitial and bronchial thickening. No pleural fluid or pneumothorax. Unchanged osseous structures. IMPRESSION: Improved right lung aeration from exam 3 months prior with residual or recurrent right basilar and suprahilar patchy opacities, atelectasis/scarring versus recurrent  infection. Electronically Signed   By: Jeb Levering M.D.   On: 07/21/2016 22:33      Subjective: - no chest pain, shortness of breath, no abdominal pain, nausea or vomiting.   Discharge Exam: Vitals:   08/02/16 2000 08/03/16 0424  BP: 104/68 (!) 147/94  Pulse: 88 93  Resp: 18   Temp: 98.9 F (37.2 C) 98.8 F (37.1 C)   Vitals:   08/02/16 1455 08/02/16 1500 08/02/16 2000 08/03/16 0424  BP: (!) 102/56 114/67 104/68 (!) 147/94  Pulse: 78 66 88 93  Resp: (!) 8 18 18    Temp:  97.1 F (36.2 C) 98.9 F (37.2 C) 98.8 F (37.1 C)  TempSrc:  Oral Oral Axillary  SpO2: 98% 98% 97% 95%  Weight:    88.5 kg (195 lb 1.7 oz)  Height:        General: Pt is alert, awake, not in acute distress Cardiovascular: RRR, S1/S2 +, no rubs, no gallops Respiratory: CTA bilaterally, no wheezing, no rhonchi Abdominal: Soft, NT, ND, bowel sounds + Extremities: no edema, no cyanosis    The results of significant diagnostics from this hospitalization (including imaging, microbiology, ancillary and laboratory) are listed below for reference.     Microbiology: No results found for this or any previous visit (from the past 240 hour(s)).   Labs: BNP (last 3 results)  Recent Labs  04/30/16 1217 05/08/16 0505  BNP 184.3* 628.3*   Basic Metabolic Panel:  Recent Labs Lab 08/01/16 1132 08/03/16 0542  NA 141 141  K 3.7 3.9  CL 103 108  CO2 33* 28  GLUCOSE 111* 85  BUN 15 15  CREATININE 0.89 0.94  CALCIUM 8.5* 8.4*   Liver Function Tests:  Recent Labs Lab 08/01/16 1132  AST 29  ALT 27  ALKPHOS 105  BILITOT 0.8  PROT 6.8  ALBUMIN 2.6*    Recent Labs Lab 08/01/16 1132  LIPASE 35   No results for input(s): AMMONIA in the last 168 hours. CBC:  Recent Labs Lab 08/01/16 1132 08/01/16 1650 08/02/16 0648 08/03/16 0542  WBC 4.1 3.6* 3.2* 5.8  HGB 8.2* 8.2* 7.6* 9.2*  HCT 29.5* 29.9* 26.8* 31.1*  MCV 82.4 81.3 81.7 81.2  PLT 483* 531* 473* 504*   Cardiac  Enzymes: No results for input(s): CKTOTAL, CKMB, CKMBINDEX, TROPONINI in the last 168 hours. BNP: Invalid input(s): POCBNP CBG:  Recent Labs Lab 08/02/16 0758  GLUCAP 79   D-Dimer No results for input(s): DDIMER in the last 72 hours. Hgb A1c No results for input(s): HGBA1C in the last 72 hours. Lipid Profile No results for input(s): CHOL, HDL, LDLCALC, TRIG, CHOLHDL, LDLDIRECT in the last 72 hours. Thyroid function studies No results for input(s): TSH, T4TOTAL, T3FREE, THYROIDAB in the last 72 hours.  Invalid input(s): FREET3 Anemia work up No results for input(s): VITAMINB12, FOLATE, FERRITIN, TIBC, IRON, RETICCTPCT in the last 72 hours. Urinalysis    Component Value Date/Time   COLORURINE YELLOW 08/01/2016 1132   APPEARANCEUR CLEAR 08/01/2016 1132   LABSPEC 1.011 08/01/2016 1132   PHURINE 9.0 (H) 08/01/2016 1132   GLUCOSEU NEGATIVE 08/01/2016 1132   HGBUR NEGATIVE 08/01/2016 1132   HGBUR negative 07/31/2009 1526   BILIRUBINUR NEGATIVE 08/01/2016 1132   KETONESUR NEGATIVE 08/01/2016 1132   PROTEINUR NEGATIVE 08/01/2016 1132   UROBILINOGEN 0.2 09/11/2014 1951   NITRITE NEGATIVE 08/01/2016 1132   LEUKOCYTESUR NEGATIVE 08/01/2016 1132   Sepsis Labs Invalid input(s): PROCALCITONIN,  WBC,  LACTICIDVEN Microbiology No results found for this or any previous visit (from the past 240 hour(s)).   Time coordinating discharge: 35 minutes  SIGNED:  Marzetta Board, MD  [facility] 08/03/2016, 8:24 AM Pager [phone]  If 7PM-7AM, please contact night-coverage [email or web address] Password [email or web address]

## 2016-08-03 NOTE — NC FL2 (Signed)
[place] LEVEL OF CARE SCREENING TOOL     IDENTIFICATION  Patient Name: [name] Birthdate: 1966/03/09 Sex: male Admission Date (Current Location): 08/01/2016  [place] and [place] Number:  Kathleen Argue [id number] [place] and Address:  [facility],  [place] [address], [place]      Provider Number: [id number]  Attending Physician Name and Address:  Caren Griffins, MD  Relative Name and Phone Number:       Current Level of Care: Other (Comment) (Group Home) Recommended Level of Care: Other (Comment) (Group Home) Prior Approval Number:    Date Approved/Denied:   PASRR Number:    Discharge Plan: Other (Comment) (Group Home [facility])    Current Diagnoses: Patient Active Problem List   Diagnosis Date Noted  . Acute GI bleeding 08/01/2016  . GI bleed 07/22/2016  . Thrombocytosis ([place]) 07/22/2016  . Acute blood loss anemia   . Ulcerative esophagitis   . Symptomatic anemia 07/21/2016  . Adrenal insufficiency (Addison's disease) ([place]) 06/13/2016  . Bipolar I disorder ([place]) 06/13/2016  . Allergic rhinitis 06/13/2016  . Slow transit constipation 06/13/2016  . Pneumothorax, right   . HAP (hospital-acquired pneumonia)   . Bilateral pulmonary infiltrates on chest x-ray   . Arterial hypotension   . HCAP (healthcare-associated pneumonia)   . Acute respiratory failure with hypoxemia ([place]) 04/30/2016  . Anemia, iron deficiency 04/25/2016  . Altered mental status   . Aspiration pneumonia of right lower lobe due to gastric secretions ([place])   . Sepsis ([place]) 04/23/2016  . Pressure injury of skin 04/23/2016  . Community acquired pneumonia of right lower lobe of lung ([place]) 04/22/2016  . Absolute anemia 02/22/2015  . Closed head injury 02/22/2015  . Spastic quadriparesis ([place]) 02/22/2015  . Anxiety state 02/22/2015  . 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 Depression 03/28/2007  . GERD with esophagitis 02/24/2007  . Generalized  convulsive epilepsy ([place]) 05/26/1984    Orientation RESPIRATION BLADDER Height & Weight     Self  Normal Indwelling catheter Weight: 195 lb 1.7 oz (88.5 kg) Height:  6\' 3"  (190.5 cm)  BEHAVIORAL SYMPTOMS/MOOD NEUROLOGICAL BOWEL NUTRITION STATUS      Continent  (Soft Diet)  AMBULATORY STATUS COMMUNICATION OF NEEDS Skin   Extensive Assist Verbally Normal                       Personal Care Assistance Level of Assistance  Bathing, Feeding, Dressing Bathing Assistance: Maximum assistance Feeding assistance: Limited assistance Dressing Assistance: Maximum assistance     Functional Limitations Info  Sight, Hearing Sight Info: Adequate Hearing Info: Adequate Speech Info: Adequate    SPECIAL CARE FACTORS FREQUENCY                       Contractures Contractures Info: Not present    Additional Factors Info  Code Status, Allergies Code Status Info: Full Code Allergies Info: Pineapple           Current Medications (08/03/2016):  This is the current hospital active medication list Current Facility-Administered Medications  Medication Dose Route Frequency Provider Last Rate Last Dose  . acetaminophen (TYLENOL) tablet 650 mg  650 mg Oral Q6H PRN Modena Jansky, MD       Or  . acetaminophen (TYLENOL) suppository 650 mg  650 mg Rectal Q6H PRN Modena Jansky, MD      . albuterol (PROVENTIL) (2.5 MG/3ML) 0.083% nebulizer solution 2.5 mg  2.5 mg Nebulization Q2H PRN Modena Jansky, MD      . amoxicillin-clavulanate (AUGMENTIN) 875-125 MG per tablet 1 tablet  1 tablet Oral BID Modena Jansky, MD   1 tablet at 08/03/16 0941  . benztropine (COGENTIN) tablet 0.5 mg  0.5 mg Oral q morning - 10a Modena Jansky, MD   0.5 mg at 08/03/16 0940  . carbamazepine (TEGRETOL) tablet 200 mg  200 mg Oral TID Modena Jansky, MD   200 mg at 08/03/16 0942  . escitalopram (LEXAPRO) tablet 10 mg  10 mg Oral q morning - 10a Modena Jansky, MD   10 mg at 08/03/16 0942  .  hydrocortisone (CORTEF) tablet 10 mg  10 mg Oral q morning - 10a Modena Jansky, MD   10 mg at 08/03/16 0941  . hydrocortisone (CORTEF) tablet 5 mg  5 mg Oral QPM Modena Jansky, MD   5 mg at 08/02/16 1808  . lamoTRIgine (LAMICTAL) tablet 100 mg  100 mg Oral BID Modena Jansky, MD   100 mg at 08/02/16 2159  . midodrine (PROAMATINE) tablet 10 mg  10 mg Oral TID WC Modena Jansky, MD   10 mg at 08/03/16 0940  . ondansetron (ZOFRAN) tablet 4 mg  4 mg Oral Q6H PRN Modena Jansky, MD       Or  . ondansetron (ZOFRAN) injection 4 mg  4 mg Intravenous Q6H PRN Modena Jansky, MD      . pantoprazole (PROTONIX) injection 40 mg  40 mg Intravenous Q12H Modena Jansky, MD   40 mg at 08/03/16 0940  . polyethylene glycol (MIRALAX / GLYCOLAX) packet 17 g  17 g Oral Daily PRN Modena Jansky, MD      . risperiDONE (RISPERDAL) tablet 2 mg  2 mg Oral BID Modena Jansky, MD   2 mg at 08/03/16 0941  . sodium chloride flush (NS) 0.9 % injection 3 mL  3 mL Intravenous Q12H Modena Jansky, MD   3 mL at 08/02/16 2200  . vitamin B-12 (CYANOCOBALAMIN) tablet 100 mcg  100 mcg Oral Daily Modena Jansky, MD   100 mcg at 08/03/16 0940     Discharge Medications: Please see discharge summary for a list of discharge medications.  Relevant Imaging Results:  Relevant Lab Results:   Additional Information SSN [id number]  Anell Barr

## 2016-08-03 NOTE — Clinical Social Work Note (Addendum)
CSW spoke to patient's mother in regards to discharging back to group home today, she said to contact group home and speak to [place], phone number [phone], CSW attempted to call and left a message awaiting for call back.  CSW received phone call back from [place] at group home, and they can pick patient up around 12:00pm today.  Patient to be d/c'ed today to [place].  Patient and family agreeable to plans will transport via group home transportation.  Jones Broom. [place], MSW, [place]  08/03/2016 10:48 AM

## 2016-08-05 LAB — TYPE AND SCREEN
ABO/RH(D): O POS
Antibody Screen: NEGATIVE
Unit division: 0
Unit division: 0

## 2016-08-05 LAB — BPAM RBC
BLOOD PRODUCT EXPIRATION DATE: 201804022359
Blood Product Expiration Date: 201803292359
ISSUE DATE / TIME: 201803101119
UNIT TYPE AND RH: 5100
Unit Type and Rh: 5100

## 2016-08-12 ENCOUNTER — Encounter: Payer: Self-pay | Admitting: Internal Medicine

## 2016-08-12 ENCOUNTER — Ambulatory Visit (INDEPENDENT_AMBULATORY_CARE_PROVIDER_SITE_OTHER): Payer: Medicare Other | Admitting: Internal Medicine

## 2016-08-12 VITALS — BP 106/70 | HR 80 | Ht 75.0 in | Wt 184.0 lb

## 2016-08-12 DIAGNOSIS — J690 Pneumonitis due to inhalation of food and vomit: Secondary | ICD-10-CM | POA: Diagnosis not present

## 2016-08-12 NOTE — Patient Instructions (Signed)
There is no treatment from a pulmonary perspective for this problem other than a feeding tube when and if speech therapy feels it's necessary but then the power of attorney would have to agree and it would rob him of the ability to eat which he still enjoys a lot so a decision will have to be made balancing one against the other
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
## 2016-08-13 NOTE — Assessment & Plan Note (Signed)
ST eval recs from 06/05/2016 SLP Diet Recommendations Dysphagia 3 (Mech soft) solids;Nectar thick liquid  Liquid Administration via Straw  Medication Administration Crushed with puree  Compensations Slow rate;Small sips/bites;Chin tuck  Postural Changes Seated upright at 90 degrees;Remain semi-upright after after feeds/meals (Comment)    Recurrent asp inevitable in this setting but the one pleasure pt has per caretakers is eating and I would not take that away from him  There are no directed pulmonary interventions other than NPO that can treat recurrent aspiration and even then could aspirate if vomits or has sign LPR so rec continue ppi as planned and pulmonary f/u prn'  Would strongly however consider NCB status if recurrent asp / resp failure develop and POA refuses trach or PEG as these are the only escalations medical science would have to offer in this setting  Total time devoted to counseling  > 50 % of initial 60 min office visit:  review case including past/present images/ hosp records/ NH notes  With caretaker and with pt to the extent I thought he could understand/    discussion of options/alternatives/ personally creating written customized instructions  in presence of pt  then going over those specific  Instructions directly with the caretaker including how to use all of the meds but in particular covering each new medication in detail and the difference between the maintenance= "automatic" meds and the prns using an action plan format for the latter (If this problem/symptom => do that organization reading Left to right).  Please see AVS from this visit for a full list of these instructions which I personally wrote for this pt and  are unique to this visit.

## 2016-08-25 ENCOUNTER — Other Ambulatory Visit: Payer: Self-pay | Admitting: Adult Health

## 2017-02-13 ENCOUNTER — Ambulatory Visit: Payer: Medicare Other | Attending: Family Medicine | Admitting: Physical Therapy

## 2017-03-13 ENCOUNTER — Encounter (HOSPITAL_COMMUNITY): Payer: Self-pay | Admitting: Emergency Medicine

## 2017-03-13 DIAGNOSIS — Z87891 Personal history of nicotine dependence: Secondary | ICD-10-CM | POA: Diagnosis not present

## 2017-03-13 DIAGNOSIS — Z79899 Other long term (current) drug therapy: Secondary | ICD-10-CM | POA: Insufficient documentation

## 2017-03-13 DIAGNOSIS — R112 Nausea with vomiting, unspecified: Secondary | ICD-10-CM | POA: Insufficient documentation

## 2017-03-13 DIAGNOSIS — R1012 Left upper quadrant pain: Secondary | ICD-10-CM | POA: Insufficient documentation

## 2017-03-13 DIAGNOSIS — R109 Unspecified abdominal pain: Secondary | ICD-10-CM | POA: Diagnosis present

## 2017-03-13 LAB — COMPREHENSIVE METABOLIC PANEL
ALBUMIN: 3.5 g/dL (ref 3.5–5.0)
ALK PHOS: 114 U/L (ref 38–126)
ALT: 31 U/L (ref 17–63)
AST: 20 U/L (ref 15–41)
Anion gap: 10 (ref 5–15)
BILIRUBIN TOTAL: 0.3 mg/dL (ref 0.3–1.2)
BUN: 12 mg/dL (ref 6–20)
CALCIUM: 8.9 mg/dL (ref 8.9–10.3)
CO2: 28 mmol/L (ref 22–32)
CREATININE: 0.9 mg/dL (ref 0.61–1.24)
Chloride: 99 mmol/L — ABNORMAL LOW (ref 101–111)
GFR calc Af Amer: >60 mL/min (ref >60)
Glucose, Bld: 98 mg/dL (ref 65–99)
Potassium: 3.7 mmol/L (ref 3.5–5.1)
Sodium: 137 mmol/L (ref 135–145)
TOTAL PROTEIN: 7.3 g/dL (ref 6.5–8.1)

## 2017-03-13 LAB — CBC
HCT: 41 % (ref 39.0–52.0)
Hemoglobin: 13.2 g/dL (ref 13.0–17.0)
MCH: 31.5 pg (ref 26.0–34.0)
MCHC: 32.2 g/dL (ref 30.0–36.0)
MCV: 97.9 fL (ref 78.0–100.0)
PLATELETS: 348 10*3/uL (ref 150–400)
RBC: 4.19 MIL/uL — ABNORMAL LOW (ref 4.22–5.81)
RDW: 13 % (ref 11.5–15.5)
WBC: 6.5 10*3/uL (ref 4.0–10.5)

## 2017-03-13 LAB — TYPE AND SCREEN
ABO/RH(D): O POS
ANTIBODY SCREEN: NEGATIVE

## 2017-03-13 LAB — URINALYSIS, ROUTINE W REFLEX MICROSCOPIC
BILIRUBIN URINE: NEGATIVE
Glucose, UA: NEGATIVE mg/dL
Hgb urine dipstick: NEGATIVE
KETONES UR: NEGATIVE mg/dL
LEUKOCYTES UA: NEGATIVE
NITRITE: NEGATIVE
PROTEIN: NEGATIVE mg/dL
Specific Gravity, Urine: 1.016 (ref 1.005–1.030)
pH: 6 (ref 5.0–8.0)

## 2017-03-13 LAB — LIPASE, BLOOD: Lipase: 30 U/L (ref 11–51)

## 2017-03-13 LAB — ABO/RH: ABO/RH(D): O POS

## 2017-03-13 NOTE — ED Triage Notes (Signed)
Caretaker reports pt vomited at 3pm while in the car today.  Later this evening he reported sudden onset of abd pain and then vomited again with bright red blood.

## 2017-03-14 ENCOUNTER — Emergency Department (HOSPITAL_COMMUNITY)
Admission: EM | Admit: 2017-03-14 | Discharge: 2017-03-14 | Disposition: A | Discharge status: Home / Self Care | Payer: Medicare Other | Attending: Emergency Medicine | Admitting: Emergency Medicine

## 2017-03-14 DIAGNOSIS — R109 Unspecified abdominal pain: Secondary | ICD-10-CM

## 2017-03-14 DIAGNOSIS — R112 Nausea with vomiting, unspecified: Secondary | ICD-10-CM

## 2017-03-14 DIAGNOSIS — R1012 Left upper quadrant pain: Secondary | ICD-10-CM | POA: Diagnosis not present

## 2017-03-14 LAB — HEMOGLOBIN AND HEMATOCRIT, BLOOD
HCT: 39.5 % (ref 39.0–52.0)
Hemoglobin: 12.9 g/dL — ABNORMAL LOW (ref 13.0–17.0)

## 2017-03-14 MED ORDER — ONDANSETRON HCL 4 MG PO TABS: 4.0000 mg | ORAL_TABLET | Freq: Four times a day (QID) | ORAL | 0 refills | Status: DC | PRN

## 2017-03-14 MED ORDER — PANTOPRAZOLE SODIUM 40 MG PO TBEC: 40.0000 mg | DELAYED_RELEASE_TABLET | Freq: Every day | ORAL | 1 refills | Status: DC

## 2017-03-14 NOTE — Discharge Instructions (Signed)
Return if you are having any problems. 

## 2017-03-14 NOTE — ED Provider Notes (Signed)
[facility] EMERGENCY DEPARTMENT Provider Note   CSN: [id number] Arrival date & time: 03/13/17  2100     History   Chief Complaint Chief Complaint  Patient presents with  . Abdominal Pain  . Emesis    HPI JIBREEL FEDEWA is a 51 y.o. male.  The history is provided by the patient. The history is limited by the condition of the patient (Mental retardation).  He had vomited once during the afternoon.  He then started complaining of some abdominal pain and vomited bright red blood.  He continues to complain of mild pain in the left midabdomen.  He no longer complains of nausea.  He does have a history of GI bleeds.  Past Medical History:  Diagnosis Date  . Anxiety   . Bipolar disorder ([place])   . BPH (benign prostatic hyperplasia)   . Closed head injury   . Constipation   . Depression   . Esophageal ulcer   . Malnutrition ([place])   . 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 Peptic ulcer disease   . Seizures ([place])   . Smoker     Patient Active Problem List   Diagnosis Date Noted  . Acute GI bleeding 08/01/2016  . GI bleed 07/22/2016  . Thrombocytosis ([place]) 07/22/2016  . Acute blood loss anemia   . Ulcerative esophagitis   . Symptomatic anemia 07/21/2016  . Adrenal insufficiency (Addison's disease) ([place]) 06/13/2016  . Bipolar I disorder ([place]) 06/13/2016  . Allergic rhinitis 06/13/2016  . Slow transit constipation 06/13/2016  . Pneumothorax, right   . HAP (hospital-acquired pneumonia)   . Bilateral pulmonary infiltrates on chest x-ray   . Arterial hypotension   . HCAP (healthcare-associated pneumonia)   . Acute respiratory failure with hypoxemia ([place]) 04/30/2016  . Anemia, iron deficiency 04/25/2016  . Altered mental status   . Aspiration pneumonia of right lower lobe due to gastric secretions ([place])   . Sepsis ([place]) 04/23/2016  . Pressure injury of skin 04/23/2016  .  Community acquired pneumonia of right lower lobe of lung ([place]) 04/22/2016  . Absolute anemia 02/22/2015  . Closed head injury 02/22/2015  . Spastic quadriparesis ([place]) 02/22/2015  . Anxiety state 02/22/2015  . 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 Depression 03/28/2007  . GERD with esophagitis 02/24/2007  . Generalized convulsive epilepsy ([place]) 05/26/1984    Past Surgical History:  Procedure Laterality Date  . ESOPHAGOGASTRODUODENOSCOPY  06/17/2011   Procedure: ESOPHAGOGASTRODUODENOSCOPY (EGD);  Surgeon: Wonda Horner, MD;  Location: [facility];  Service: Endoscopy;  Laterality: N/A;  . ESOPHAGOGASTRODUODENOSCOPY Left 08/02/2016   Procedure: ESOPHAGOGASTRODUODENOSCOPY (EGD);  Surgeon: Teena Irani, MD;  Location: [facility];  Service: Endoscopy;  Laterality: Left;  [facility] GASTRECTOMY         Home Medications    Prior to Admission medications   Medication Sig Start Date End Date Taking? Authorizing Provider  acetaminophen (TYLENOL) 500 MG tablet Take 500 mg by mouth every 6 (six) hours as needed.   Yes [provider]  benztropine (COGENTIN) 0.5 MG tablet Take 0.5 mg by mouth daily.   Yes [provider]  carbamazepine (TEGRETOL) 200 MG tablet Take 1 tablet (200 mg total) by mouth 3 (three) times daily. 05/17/16  Yes Eber Jones, MD  clonazePAM (KLONOPIN) 0.5 MG tablet Take 0.5 mg by mouth at bedtime. 02/26/17  Yes [provider]  escitalopram (LEXAPRO) 10 MG tablet Take  10 mg by mouth every morning.    Yes [provider]  famotidine (PEPCID) 10 MG tablet Take 10 mg by mouth 2 (two) times daily as needed for heartburn or indigestion.   Yes [provider]  ferrous sulfate 325 (65 FE) MG tablet Take 1 tablet (325 mg total) by mouth 2 (two) times daily with a meal. 07/26/16  Yes Regalado, Belkys A, MD  hydrocortisone (CORTEF) 5 MG tablet Take 5-10 mg by mouth See admin instructions. Take 2 tablets every morning and take 1 tablet  every evening   Yes [provider]  lamoTRIgine (LAMICTAL) 200 MG tablet Take 400 mg by mouth at bedtime. 02/27/17  Yes [provider]  midodrine (PROAMATINE) 10 MG tablet Take 1 tablet (10 mg total) by mouth 3 (three) times daily with meals. 05/17/16  Yes Eber Jones, MD  Olopatadine HCl 0.2 % SOLN Place 1 drop into both eyes daily. 02/26/17  Yes [provider]  pantoprazole (PROTONIX) 40 MG tablet Take 1 tablet (40 mg total) by mouth 2 (two) times daily before a meal. 08/03/16  Yes Gherghe, Vella Redhead, MD  polyethylene glycol (MIRALAX / GLYCOLAX) packet Take 17 g by mouth daily as needed for mild constipation.   Yes [provider]  risperiDONE (RISPERDAL) 1 MG tablet Take 1 mg by mouth 2 (two) times daily. 02/26/17  Yes [provider]  traZODone (DESYREL) 50 MG tablet Take 50 mg by mouth at bedtime. 02/26/17  Yes [provider]  vitamin B-12 100 MCG tablet Take 1 tablet (100 mcg total) by mouth daily. 07/27/16  Yes Regalado, Belkys A, MD  lamoTRIgine (LAMICTAL) 100 MG tablet Take 1 tablet (100 mg total) by mouth 2 (two) times daily. Patient not taking: Reported on 03/14/2017 05/17/16   Eber Jones, MD    Family History Family History  Problem Relation Age of Onset  . Family history unknown: Yes    Social History Social History  Substance Use Topics  . Smoking status: Former Smoker    Packs/day: 0.50    Years: 27.00    Quit date: 05/26/2010  . Smokeless tobacco: Former Systems developer    Quit date: 06/08/2001  . Alcohol use No     Allergies   Pineapple   Review of Systems Review of Systems  Unable to perform ROS: Other     Physical Exam Updated Vital Signs BP 107/83   Pulse 81   Temp 98.1 F (36.7 C) (Oral)   Resp 18   SpO2 95%   Physical Exam  Nursing note and vitals reviewed.  51 year old male, resting comfortably and in no acute distress. Vital signs are normal. Oxygen saturation is 95%, which is  normal. Head is normocephalic and atraumatic. PERRLA, EOMI. Oropharynx is clear. Neck is nontender and supple without adenopathy or JVD. Back is nontender and there is no CVA tenderness. Lungs are clear without rales, wheezes, or rhonchi. Chest is nontender. Heart has regular rate and rhythm without murmur. Abdomen is soft, flat, with mild tenderness in the left mid abdomen.  There is no rebound or guarding.  There are no masses or hepatosplenomegaly and peristalsis is hypoactive. Extremities have no cyanosis or edema, full range of motion is present. Skin is warm and dry without rash. Neurologic: Mental status is normal, cranial nerves are intact, there are no motor or sensory deficits.  ED Treatments / Results  Labs (all labs ordered are listed, but only abnormal results are displayed) Labs Reviewed  COMPREHENSIVE METABOLIC PANEL - Abnormal; Notable for the following:       Result Value   Chloride 99 (*)    All other components within normal limits  CBC - Abnormal; Notable for the following:    RBC 4.19 (*)    All other components within normal limits  HEMOGLOBIN AND HEMATOCRIT, BLOOD - Abnormal; Notable for the following:    Hemoglobin 12.9 (*)    All other components within normal limits  LIPASE, BLOOD  URINALYSIS, ROUTINE W REFLEX MICROSCOPIC  TYPE AND SCREEN  ABO/RH   Procedures Procedures (including critical care time)  Medications Ordered in ED Medications - No data to display   Initial Impression / Assessment and Plan / ED Course  I have reviewed the triage vital signs and the nursing notes.  Pertinent lab results that were available during my care of the patient were reviewed by me and considered in my medical decision making (see chart for details).  Episode of possible upper GI bleeding.  Old records are reviewed, and he does have prior hospitalization for GI bleed.  Hemoglobin today is normal.  Last hemoglobin on record was from last March and was 9.2.  He has  not vomited while in the ED.  Repeat hemoglobin will be checked.  Repeat hemoglobin has had minimal drop from original value.  This was drawn 8 hours later.  Given lack of vomiting in the ED and relative stability of hemoglobin, it was felt that he is safe for discharge.  He had been on pantoprazole in the past and is given prescription for same.  Also given prescription for ondansetron.  Final Clinical Impressions(s) / ED Diagnoses   Final diagnoses:  Abdominal pain, unspecified abdominal location  Non-intractable vomiting with nausea, unspecified vomiting type    New Prescriptions New Prescriptions   ONDANSETRON (ZOFRAN) 4 MG TABLET    Take 1 tablet (4 mg total) by mouth every 6 (six) hours as needed for nausea or vomiting.     Delora Fuel, MD 83/09/40 [phone]

## 2017-03-14 NOTE — ED Notes (Signed)
Pt stable, ambulatory, states understanding of discharge instructions 

## 2017-03-26 ENCOUNTER — Emergency Department (HOSPITAL_COMMUNITY): Payer: Medicare Other

## 2017-03-26 ENCOUNTER — Encounter (HOSPITAL_COMMUNITY): Payer: Self-pay | Admitting: Emergency Medicine

## 2017-03-26 ENCOUNTER — Emergency Department (HOSPITAL_COMMUNITY)
Admission: EM | Admit: 2017-03-26 | Discharge: 2017-03-26 | Disposition: A | Discharge status: Home / Self Care | Payer: Medicare Other | Attending: Emergency Medicine | Admitting: Emergency Medicine

## 2017-03-26 DIAGNOSIS — Z79899 Other long term (current) drug therapy: Secondary | ICD-10-CM | POA: Diagnosis not present

## 2017-03-26 DIAGNOSIS — Z87891 Personal history of nicotine dependence: Secondary | ICD-10-CM | POA: Insufficient documentation

## 2017-03-26 DIAGNOSIS — R1013 Epigastric pain: Secondary | ICD-10-CM | POA: Diagnosis not present

## 2017-03-26 LAB — COMPREHENSIVE METABOLIC PANEL
ALK PHOS: 128 U/L — AB (ref 38–126)
ALT: 25 U/L (ref 17–63)
AST: 20 U/L (ref 15–41)
Albumin: 3.5 g/dL (ref 3.5–5.0)
Anion gap: 11 (ref 5–15)
BUN: 12 mg/dL (ref 6–20)
CO2: 29 mmol/L (ref 22–32)
Calcium: 8.9 mg/dL (ref 8.9–10.3)
Chloride: 100 mmol/L — ABNORMAL LOW (ref 101–111)
Creatinine, Ser: 0.81 mg/dL (ref 0.61–1.24)
Glucose, Bld: 110 mg/dL — ABNORMAL HIGH (ref 65–99)
Potassium: 3.8 mmol/L (ref 3.5–5.1)
SODIUM: 140 mmol/L (ref 135–145)
Total Bilirubin: 0.3 mg/dL (ref 0.3–1.2)
Total Protein: 7.3 g/dL (ref 6.5–8.1)

## 2017-03-26 LAB — CBC WITH DIFFERENTIAL/PLATELET
Basophils Absolute: 0 10*3/uL (ref 0.0–0.1)
Basophils Relative: 1 %
EOS ABS: 0.2 10*3/uL (ref 0.0–0.7)
EOS PCT: 3 %
HCT: 40.8 % (ref 39.0–52.0)
Hemoglobin: 13.1 g/dL (ref 13.0–17.0)
LYMPHS ABS: 1.1 10*3/uL (ref 0.7–4.0)
Lymphocytes Relative: 22 %
MCH: 32 pg (ref 26.0–34.0)
MCHC: 32.1 g/dL (ref 30.0–36.0)
MCV: 99.8 fL (ref 78.0–100.0)
MONO ABS: 0.7 10*3/uL (ref 0.1–1.0)
Monocytes Relative: 14 %
Neutro Abs: 3.2 10*3/uL (ref 1.7–7.7)
Neutrophils Relative %: 60 %
PLATELETS: 329 10*3/uL (ref 150–400)
RBC: 4.09 MIL/uL — AB (ref 4.22–5.81)
RDW: 12.8 % (ref 11.5–15.5)
WBC: 5.3 10*3/uL (ref 4.0–10.5)

## 2017-03-26 LAB — URINALYSIS, ROUTINE W REFLEX MICROSCOPIC
Bilirubin Urine: NEGATIVE
Glucose, UA: NEGATIVE mg/dL
Hgb urine dipstick: NEGATIVE
Ketones, ur: NEGATIVE mg/dL
Leukocytes, UA: NEGATIVE
Nitrite: NEGATIVE
PH: 8 (ref 5.0–8.0)
Protein, ur: NEGATIVE mg/dL
SPECIFIC GRAVITY, URINE: 1.014 (ref 1.005–1.030)
Squamous Epithelial / LPF: NONE SEEN
WBC, UA: NONE SEEN WBC/hpf (ref 0–5)

## 2017-03-26 LAB — LIPASE, BLOOD: Lipase: 27 U/L (ref 11–51)

## 2017-03-26 LAB — POC OCCULT BLOOD, ED: FECAL OCCULT BLD: NEGATIVE

## 2017-03-26 MED ORDER — SODIUM CHLORIDE 0.9 % IV BOLUS (SEPSIS)
1000.0000 mL | Freq: Once | INTRAVENOUS | Status: AC
  Administered 2017-03-26: 1000 mL via INTRAVENOUS

## 2017-03-26 MED ORDER — FLUCONAZOLE 150 MG PO TABS
150.0000 mg | ORAL_TABLET | Freq: Once | ORAL | Status: AC
  Administered 2017-03-26: 150 mg via ORAL
  Filled 2017-03-26: qty 1

## 2017-03-26 MED ORDER — IOPAMIDOL (ISOVUE-300) INJECTION 61%
INTRAVENOUS | Status: AC
  Filled 2017-03-26: qty 100

## 2017-03-26 MED ORDER — IOPAMIDOL (ISOVUE-300) INJECTION 61%
100.0000 mL | Freq: Once | INTRAVENOUS | Status: AC | PRN
  Administered 2017-03-26: 100 mL via INTRAVENOUS

## 2017-03-26 NOTE — ED Provider Notes (Signed)
[facility] DEPT Provider Note   CSN: [id number] Arrival date & time: 03/26/17  1221     History   Chief Complaint Chief Complaint  Patient presents with  . Cough  . Aspiration    HPI [name] is a 51 y.o. male.  HPI  Patient is a 24-year male with a history of TBI, intellect disability, seizures, anxiety, peptic ulcer disease, depression, anxiety, bipolar, and a history of GI bleeding presenting for hypotension that was noted at his day program yesterday.  Per caregiver that is present with patient, patient had a blood pressure of 89/60 yesterday. This has not occurred since. This resolved today, however he was brought to urgent care prior to arrival in the ED.  Additionally, patient has had a cough, nonproductive of blood or sputum.  Patient visited urgent care prior to arrival, and they were concerned for aspiration pneumonia.  Patient reports that he has had epigastric pain.  It is unclear how long he has had this pain.  Patient visited the emergency department on 03-14-2017 for bright red blood in emesis, but hemoglobin and hematocrit was stable at that time.  Patient was evaluated and was asymptomatic of any GI bleeding emergency department course.  Patient has a chronic problem with emesis after eating small amounts of food.  Per caregiver, patient has not had any fever, sweating or chills, lower extremity edema, or any other complaints.  Level 5 caveat for intellectual disability.  Past Medical History:  Diagnosis Date  . Anxiety   . Bipolar disorder ([place])   . BPH (benign prostatic hyperplasia)   . Closed head injury   . Constipation   . Depression   . Esophageal ulcer   . Malnutrition ([place])   . 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 Peptic ulcer disease   . Seizures ([place])   . Smoker     Patient Active Problem List   Diagnosis Date Noted  . Acute GI  bleeding 08/01/2016  . GI bleed 07/22/2016  . Thrombocytosis ([place]) 07/22/2016  . Acute blood loss anemia   . Ulcerative esophagitis   . Symptomatic anemia 07/21/2016  . Adrenal insufficiency (Addison's disease) ([place]) 06/13/2016  . Bipolar I disorder ([place]) 06/13/2016  . Allergic rhinitis 06/13/2016  . Slow transit constipation 06/13/2016  . Pneumothorax, right   . HAP (hospital-acquired pneumonia)   . Bilateral pulmonary infiltrates on chest x-ray   . Arterial hypotension   . HCAP (healthcare-associated pneumonia)   . Acute respiratory failure with hypoxemia ([place]) 04/30/2016  . Anemia, iron deficiency 04/25/2016  . Altered mental status   . Aspiration pneumonia of right lower lobe due to gastric secretions ([place])   . Sepsis ([place]) 04/23/2016  . Pressure injury of skin 04/23/2016  . Community acquired pneumonia of right lower lobe of lung ([place]) 04/22/2016  . Absolute anemia 02/22/2015  . Closed head injury 02/22/2015  . 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 Anxiety state 02/22/2015  . 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 Depression 03/28/2007  . GERD with esophagitis 02/24/2007  . 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 ESOPHAGOGASTRODUODENOSCOPY  06/17/2011   Procedure: ESOPHAGOGASTRODUODENOSCOPY (EGD);  Surgeon: Wonda Horner, MD;  Location: [facility];  Service: Endoscopy;  Laterality: N/A;  . ESOPHAGOGASTRODUODENOSCOPY Left 08/02/2016   Procedure: ESOPHAGOGASTRODUODENOSCOPY (EGD);  Surgeon: Teena Irani, MD;  Location: [facility] ENDOSCOPY;  Service: Endoscopy;  Laterality: Left;  Marland Kitchen GASTRECTOMY         Home Medications    Prior to Admission medications   Medication Sig Start Date End Date Taking? Authorizing Provider  benztropine (COGENTIN) 0.5 MG tablet Take 0.5 mg by mouth daily.   Yes [provider]  carbamazepine (TEGRETOL) 200 MG tablet Take 1 tablet (200 mg total) by mouth 3 (three) times  daily. 05/17/16  Yes Eber Jones, MD  clonazePAM (KLONOPIN) 0.5 MG tablet Take 0.5 mg by mouth at bedtime. 02/26/17  Yes [provider]  escitalopram (LEXAPRO) 10 MG tablet Take 10 mg by mouth every morning.    Yes [provider]  ferrous sulfate 325 (65 FE) MG tablet Take 1 tablet (325 mg total) by mouth 2 (two) times daily with a meal. 07/26/16  Yes Regalado, Belkys A, MD  hydrocortisone (CORTEF) 5 MG tablet Take 5-10 mg by mouth See admin instructions. Take 2 tablets every morning and take 1 tablet every evening   Yes [provider]  lamoTRIgine (LAMICTAL) 200 MG tablet Take 400 mg by mouth at bedtime. 02/27/17  Yes [provider]  midodrine (PROAMATINE) 10 MG tablet Take 1 tablet (10 mg total) by mouth 3 (three) times daily with meals. 05/17/16  Yes Eber Jones, MD  Olopatadine HCl 0.2 % SOLN Place 1 drop into both eyes daily. 02/26/17  Yes [provider]  pantoprazole (PROTONIX) 40 MG tablet Take 1 tablet (40 mg total) by mouth daily. 95/28/41  Yes Delora Fuel, MD  risperiDONE (RISPERDAL) 1 MG tablet Take 1 mg by mouth 2 (two) times daily. 02/26/17  Yes [provider]  traZODone (DESYREL) 50 MG tablet Take 50 mg by mouth at bedtime. 02/26/17  Yes [provider]  zolpidem (AMBIEN) 10 MG tablet Take 10 mg by mouth at bedtime.   Yes [provider]  lamoTRIgine (LAMICTAL) 100 MG tablet Take 1 tablet (100 mg total) by mouth 2 (two) times daily. Patient not taking: Reported on 03/26/2017 05/17/16   Eber Jones, MD  ondansetron (ZOFRAN) 4 MG tablet Take 1 tablet (4 mg total) by mouth every 6 (six) hours as needed for nausea or vomiting. Patient not taking: Reported on 32/08/4008 27/25/36   Delora Fuel, MD  vitamin B-12 100 MCG tablet Take 1 tablet (100 mcg total) by mouth daily. Patient not taking: Reported on 03/26/2017 07/27/16   Elmarie Shiley, MD    Family History Family History  Problem  Relation Age of Onset  . Family history unknown: Yes    Social History Social History  Substance Use Topics  . Smoking status: Former Smoker    Packs/day: 0.50    Years: 27.00    Quit date: 05/26/2010  . Smokeless tobacco: Former Systems developer    Quit date: 06/08/2001  . Alcohol use No     Allergies   Pineapple   Review of Systems Review of Systems  Constitutional: Negative for diaphoresis and fever.  HENT: Positive for rhinorrhea. Negative for congestion and sore throat.   Respiratory: Positive for cough.   Cardiovascular: Negative for leg swelling.  Gastrointestinal: Positive for abdominal pain and vomiting. Negative for blood in stool.  Genitourinary: Negative for difficulty urinating and dysuria.  Musculoskeletal: Negative for back pain and myalgias.  Skin: Negative for rash.  Neurological: Negative for headaches.   Level 5 caveat intellectual disability.   Physical Exam Updated Vital Signs BP 117/86   Pulse 71   Temp 97.8  F (36.6 C) (Oral)   Resp 18   Ht 6\' 2"  (1.88 m)   SpO2 97%   Physical Exam  Constitutional: He appears well-developed and well-nourished. No distress.  Sitting comfortably in bed.  Patient is minimally engaged.  HENT:  Head: Normocephalic and atraumatic.  Mouth/Throat: Oropharynx is clear and moist.  Eyes: Conjunctivae and EOM are normal.  Pupils myotic and minimally reactive to light.  Pupils 2 mm.  Neck: Normal range of motion. Neck supple.  Cardiovascular: Normal rate, regular rhythm, S1 normal and S2 normal.   No murmur heard. Pulmonary/Chest: Effort normal and breath sounds normal. He has no wheezes. He has no rales.  Abdominal: Soft. Bowel sounds are normal. He exhibits no distension. There is tenderness. There is no guarding.  Tenderness to palpation in the epigastrium to right upper quadrant.  Genitourinary: Penis normal.  Genitourinary Comments: Exam performed under chaperone present.  Circumcised penis.  No lesions, crusting of penile  head. Mild erythema of penis. Normal rectal tone.  No stool palpated in rectal vault.  No masses palpated.  Musculoskeletal: Normal range of motion. He exhibits no edema or deformity.  Lymphadenopathy:    He has no cervical adenopathy.  Neurological: He is alert.  Cranial nerves grossly intact. Patient moves extremities symmetrically and with good coordination. Patient is nonambulatory.   Skin: Skin is warm and dry. No rash noted. No erythema.  Psychiatric:  Patient answers questions properly.  Patient does not exhibit linear thought process, or able to describe his symptoms in detail.  Nursing note and vitals reviewed.    ED Treatments / Results  Labs (all labs ordered are listed, but only abnormal results are displayed) Labs Reviewed  COMPREHENSIVE METABOLIC PANEL - Abnormal; Notable for the following:       Result Value   Chloride 100 (*)    Glucose, Bld 110 (*)    Alkaline Phosphatase 128 (*)    All other components within normal limits  CBC WITH DIFFERENTIAL/PLATELET - Abnormal; Notable for the following:    RBC 4.09 (*)    All other components within normal limits  URINALYSIS, ROUTINE W REFLEX MICROSCOPIC - Abnormal; Notable for the following:    APPearance CLOUDY (*)    Bacteria, UA RARE (*)    All other components within normal limits  LIPASE, BLOOD  POC OCCULT BLOOD, ED    EKG  EKG Interpretation None       Radiology Dg Chest 2 View  Result Date: 03/26/2017 CLINICAL DATA:  Cough, possible aspiration pneumonia. EXAM: CHEST  2 VIEW COMPARISON:  Chest x-ray of July 22, 2016 FINDINGS: The lungs are adequately inflated. There is no focal infiltrate. There is no pleural effusion. The heart is top-normal in size but stable. The central pulmonary vascularity is mildly prominent. The mediastinum is normal in width. The observed bony thorax is unremarkable. IMPRESSION: No evidence of pneumonia. Stable mild cardiomegaly and central pulmonary vascular congestion. No  pleural effusion. Electronically Signed   By: Nicoli  Martinique M.D.   On: 03/26/2017 13:01   Ct Abdomen Pelvis W Contrast  Result Date: 03/26/2017 CLINICAL DATA:  51 y/o  M; abdominal pain and vomiting. EXAM: CT ABDOMEN AND PELVIS WITH CONTRAST TECHNIQUE: Multidetector CT imaging of the abdomen and pelvis was performed using the standard protocol following bolus administration of intravenous contrast. CONTRAST:  181mL ISOVUE-300 IOPAMIDOL (ISOVUE-300) INJECTION 61% COMPARISON:  12/10/2010 CT abdomen and pelvis. 05/09/2016 CT angiogram of the chest. FINDINGS: Lower chest: No acute abnormality. Hepatobiliary: No  focal liver abnormality is seen. No gallstones, gallbladder wall thickening, or biliary dilatation. Pancreas: Unremarkable. No pancreatic ductal dilatation or surrounding inflammatory changes. Spleen: Normal in size without focal abnormality. Adrenals/Urinary Tract: Normal adrenal glands. Subcentimeter kidney cysts bilaterally. No hydronephrosis. Mild bladder wall thickening, probably due to underdistention. Stomach/Bowel: Stomach is within normal limits. Appendix not identified, no pericecal inflammatory changes. No evidence of bowel wall thickening, distention, or inflammatory changes. Vascular/Lymphatic: Aortic atherosclerosis. No enlarged abdominal or pelvic lymph nodes. Reproductive: Prostate is unremarkable. Other: No abdominal wall hernia or abnormality. No abdominopelvic ascites. Musculoskeletal: Moderate lumbar spine levocurvature. Chronic appearing mild right anterior compression deformity of L1 vertebral body. Chronic fracture deformity of right ilium. IMPRESSION: 1. No acute process identified. 2. Chronic appearing right anterior mild L1 compression deformity. Chronic right ilium fracture deformity. 3. Aortic atherosclerosis. Electronically Signed   By: Kristine Garbe M.D.   On: 03/26/2017 16:08    Procedures Procedures (including critical care time)  Medications Ordered in  ED Medications  iopamidol (ISOVUE-300) 61 % injection (not administered)  sodium chloride 0.9 % bolus 1,000 mL (1,000 mLs Intravenous New Bag/Given 03/26/17 1437)  iopamidol (ISOVUE-300) 61 % injection 100 mL (100 mLs Intravenous Contrast Given 03/26/17 1538)     Initial Impression / Assessment and Plan / ED Course  I have reviewed the triage vital signs and the nursing notes.  Pertinent labs & imaging results that were available during my care of the patient were reviewed by me and considered in my medical decision making (see chart for details).     Final Clinical Impressions(s) / ED Diagnoses   Final diagnoses:  Epigastric pain   Patient is afebrile, nontoxic-appearing, and normotensive at this time.  Differential diagnosis includes adrenal insufficiency crisis, pancreatitis, cholecystitis, gastritis.  We will proceed with CMP, CBC,  CT of chest and CT abdomen pelvis.  In discussion with additional caregiver that attended patient during ED visit, it was  revealed that patient continues to have some bright red blood in his emesis, which has been chronic for several months. Hemoglobin and hematocrit are stable today.  Hemoglobin 12.9 on 03-14-2017 and hemoglobin 13.1 today.  Will obtain occult blood to assess.  Patient has seen a gastroneurologist for this concern previously, and patient instructed that he and his caregivers need to follow-up with his gastroenterologist.  Patient reevaluated multiple times and is asymptomatic of concerns.  There is no evidence of hypotension today, and lungs are clear.  Chest x-ray demonstrates no infiltrate.  No leukocytosis. Patient was asymptomatic of dysuria, but yeast picked up on routine UA.  Penis showed no evidence of balanitis, treated patient with Diflucan empirically.  Will send urine for culture.  Patient is stable for discharge with gastroenterology follow-up with patient's gastroenterologist and close PCP follow-up.  Patient and caregiver are in  understanding and agree with the plan of care.  This is a shared visit with Dr. Jola Schmidt. Patient was independently evaluated by this attending physician. Attending physician consulted in evaluation and discharge management.  New Prescriptions New Prescriptions   No medications on file     Tamala Julian 03/26/17 Hoffman, Kevin, MD 03/27/17 [phone]

## 2017-03-26 NOTE — ED Triage Notes (Signed)
Patient arrives with caretaker, reports she takes his vital signs every day and today his BP was low. States his BP usually runs low. Taken to urgent care and believes that he has aspiration pneumonia. Pt already on a thickened diet. Caretaker states pt always has a cough, but worse over the past week. Patient points to right upper quadrant and states minor pain onset of this am.

## 2017-03-26 NOTE — Discharge Instructions (Signed)
Please see the information and instructions below regarding your visit.  Your diagnoses today include:  1. Epigastric pain    Your testing is reassuring today.  There is no evidence of pneumonia on your chest x-ray.  Your blood pressures have been normal today.  Tests performed today include: See side panel of your discharge paperwork for testing performed today. Vital signs are listed at the bottom of these instructions.   -CT scan abdomen and pelvis.  Medications prescribed:    Take any prescribed medications only as prescribed, and any over the counter medications only as directed on the packaging.  Home care instructions:  Please follow any educational materials contained in this packet.   Follow-up instructions: Please follow-up with your primary care provider in next week for further evaluation of your symptoms and the yeast infection we identified. This is likely on the tip of the penis, but I would like this followed up.  Please follow up with your gastroenterologist as soon as possible.  Return instructions:  Please return to the Emergency Department if you experience worsening symptoms.  Please return if you have any other emergent concerns.  Additional Information:   Your vital signs today were: BP 117/86    Pulse 71    Temp 97.8 F (36.6 C) (Oral)    Resp 18    Ht 6\' 2"  (1.88 m)    SpO2 97%  If your blood pressure (BP) was elevated on multiple readings during this visit above 130 for the top number or above 80 for the bottom number, please have this repeated by your primary care provider within one month. --------------  Thank you for allowing [place] to participate in your care today.

## 2017-03-28 LAB — URINE CULTURE: CULTURE: NO GROWTH

## 2017-04-28 ENCOUNTER — Encounter (HOSPITAL_COMMUNITY): Payer: Self-pay | Admitting: Emergency Medicine

## 2017-04-28 ENCOUNTER — Emergency Department (HOSPITAL_COMMUNITY): Payer: Medicare Other

## 2017-04-28 DIAGNOSIS — F79 Unspecified intellectual disabilities: Secondary | ICD-10-CM | POA: Insufficient documentation

## 2017-04-28 DIAGNOSIS — Z79899 Other long term (current) drug therapy: Secondary | ICD-10-CM | POA: Insufficient documentation

## 2017-04-28 DIAGNOSIS — R1013 Epigastric pain: Secondary | ICD-10-CM | POA: Diagnosis not present

## 2017-04-28 DIAGNOSIS — R112 Nausea with vomiting, unspecified: Secondary | ICD-10-CM | POA: Insufficient documentation

## 2017-04-28 DIAGNOSIS — Z87891 Personal history of nicotine dependence: Secondary | ICD-10-CM | POA: Insufficient documentation

## 2017-04-28 DIAGNOSIS — R0789 Other chest pain: Secondary | ICD-10-CM | POA: Diagnosis present

## 2017-04-28 LAB — CBC
HEMATOCRIT: 39.5 % (ref 39.0–52.0)
HEMOGLOBIN: 12.8 g/dL — AB (ref 13.0–17.0)
MCH: 32.2 pg (ref 26.0–34.0)
MCHC: 32.4 g/dL (ref 30.0–36.0)
MCV: 99.5 fL (ref 78.0–100.0)
Platelets: 344 10*3/uL (ref 150–400)
RBC: 3.97 MIL/uL — ABNORMAL LOW (ref 4.22–5.81)
RDW: 13 % (ref 11.5–15.5)
WBC: 6.9 10*3/uL (ref 4.0–10.5)

## 2017-04-28 LAB — BASIC METABOLIC PANEL
ANION GAP: 8 (ref 5–15)
BUN: 15 mg/dL (ref 6–20)
CALCIUM: 9.2 mg/dL (ref 8.9–10.3)
CO2: 30 mmol/L (ref 22–32)
Chloride: 103 mmol/L (ref 101–111)
Creatinine, Ser: 0.85 mg/dL (ref 0.61–1.24)
GFR calc Af Amer: >60 mL/min (ref >60)
GFR calc non Af Amer: >60 mL/min (ref >60)
GLUCOSE: 96 mg/dL (ref 65–99)
Potassium: 4.2 mmol/L (ref 3.5–5.1)
Sodium: 141 mmol/L (ref 135–145)

## 2017-04-28 LAB — I-STAT TROPONIN, ED: TROPONIN I, POC: 0 ng/mL (ref 0.00–0.08)

## 2017-04-28 NOTE — ED Triage Notes (Signed)
Pt comes with complaints of intermittent vomiting over the past week.  Pt endorses centralized chest pain that comes and goes.  Pt with group home staff.  Also states there has been blood tinge in his emesis. Pt has been eating per normal but often throws it up per caregiver.  Pt able to answer questions appropriately.  Hx of MR.

## 2017-04-29 ENCOUNTER — Other Ambulatory Visit: Payer: Self-pay

## 2017-04-29 ENCOUNTER — Emergency Department (HOSPITAL_COMMUNITY)
Admission: EM | Admit: 2017-04-29 | Discharge: 2017-04-29 | Disposition: A | Discharge status: Home / Self Care | Payer: Medicare Other | Attending: Emergency Medicine | Admitting: Emergency Medicine

## 2017-04-29 DIAGNOSIS — R1013 Epigastric pain: Secondary | ICD-10-CM

## 2017-04-29 DIAGNOSIS — R112 Nausea with vomiting, unspecified: Secondary | ICD-10-CM

## 2017-04-29 LAB — URINALYSIS, ROUTINE W REFLEX MICROSCOPIC
BILIRUBIN URINE: NEGATIVE
Glucose, UA: NEGATIVE mg/dL
Hgb urine dipstick: NEGATIVE
KETONES UR: NEGATIVE mg/dL
Leukocytes, UA: NEGATIVE
NITRITE: NEGATIVE
Protein, ur: NEGATIVE mg/dL
SPECIFIC GRAVITY, URINE: 1.02 (ref 1.005–1.030)
pH: 6 (ref 5.0–8.0)

## 2017-04-29 LAB — POC OCCULT BLOOD, ED
FECAL OCCULT BLD: POSITIVE — AB
Fecal Occult Bld: POSITIVE — AB

## 2017-04-29 LAB — I-STAT TROPONIN, ED: TROPONIN I, POC: 0 ng/mL (ref 0.00–0.08)

## 2017-04-29 MED ORDER — PANTOPRAZOLE SODIUM 40 MG IV SOLR
40.0000 mg | Freq: Once | INTRAVENOUS | Status: AC
  Administered 2017-04-29: 40 mg via INTRAVENOUS
  Filled 2017-04-29: qty 40

## 2017-04-29 MED ORDER — FAMOTIDINE 20 MG PO TABS: 20.0000 mg | ORAL_TABLET | Freq: Two times a day (BID) | ORAL | 0 refills | Status: AC

## 2017-04-29 MED ORDER — PANTOPRAZOLE SODIUM 40 MG PO TBEC: 40.0000 mg | DELAYED_RELEASE_TABLET | Freq: Every day | ORAL | 1 refills | Status: AC

## 2017-04-29 MED ORDER — ONDANSETRON HCL 4 MG/2ML IJ SOLN
4.0000 mg | Freq: Once | INTRAMUSCULAR | Status: AC
  Administered 2017-04-29: 4 mg via INTRAVENOUS
  Filled 2017-04-29: qty 2

## 2017-04-29 MED ORDER — SUCRALFATE 1 GM/10ML PO SUSP: 1.0000 g | Freq: Three times a day (TID) | ORAL | 0 refills | Status: AC

## 2017-04-29 MED ORDER — SODIUM CHLORIDE 0.9 % IV BOLUS (SEPSIS)
1000.0000 mL | Freq: Once | INTRAVENOUS | Status: AC
  Administered 2017-04-29: 1000 mL via INTRAVENOUS

## 2017-04-29 MED ORDER — FAMOTIDINE IN NACL 20-0.9 MG/50ML-% IV SOLN
20.0000 mg | Freq: Once | INTRAVENOUS | Status: AC
  Administered 2017-04-29: 20 mg via INTRAVENOUS
  Filled 2017-04-29: qty 50

## 2017-04-29 NOTE — ED Provider Notes (Signed)
[facility] DEPT Provider Note   CSN: [id number] Arrival date & time: 04/28/17  1904     History   Chief Complaint Chief Complaint  Patient presents with  . Chest Pain  . Emesis    HPI [name] is a 51 y.o. male with history of intellectual disability, peptic ulcer disease, history of UGIB, chronic anemia, adrenal insufficiency presents to the ED for evaluation chronic, intermittent epigastric abdominal pain that radiates to the central part of his chest for the last 3 days. Pain is worse with eating and after eating, followed by nausea and vomiting. Nursing home staff states he has vomited a small amount of emesis with bloody streaks with most meals in the last 3 days. States this is not new and has been gone on for a long time, has been evaluated by GI in the past and used to take Carafate but was told to stop 3-4 months ago. Has been taken Pepto-Bismol and Tums as needed at home without relief. Patient otherwise feels at baseline, denies fevers, chills, exertional chest pain or shortness of breath, dysuria. Nursing home staff and patient are unsure about diarrhea, melena or hematochezia.  Chart review reveals pt admitted for GI bleed on March 2018. EGD showed significant esophagitis. He got 1 U pRBC. Discharged with life long PPI.  HPI  Past Medical History:  Diagnosis Date  . Anxiety   . Bipolar disorder ([place])   . BPH (benign prostatic hyperplasia)   . Closed head injury   . Constipation   . Depression   . Esophageal ulcer   . Malnutrition ([place])   . 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 Peptic ulcer disease   . Seizures ([place])   . Smoker     Patient Active Problem List   Diagnosis Date Noted  . Acute GI bleeding 08/01/2016  . GI bleed 07/22/2016  . Thrombocytosis ([place]) 07/22/2016  . Acute blood loss anemia   . Ulcerative esophagitis   . Symptomatic  anemia 07/21/2016  . Adrenal insufficiency (Addison's disease) ([place]) 06/13/2016  . Bipolar I disorder ([place]) 06/13/2016  . Allergic rhinitis 06/13/2016  . Slow transit constipation 06/13/2016  . Pneumothorax, right   . HAP (hospital-acquired pneumonia)   . Bilateral pulmonary infiltrates on chest x-ray   . Arterial hypotension   . HCAP (healthcare-associated pneumonia)   . Acute respiratory failure with hypoxemia ([place]) 04/30/2016  . Anemia, iron deficiency 04/25/2016  . Altered mental status   . Aspiration pneumonia of right lower lobe due to gastric secretions ([place])   . Sepsis ([place]) 04/23/2016  . Pressure injury of skin 04/23/2016  . Community acquired pneumonia of right lower lobe of lung ([place]) 04/22/2016  . Absolute anemia 02/22/2015  . Closed head injury 02/22/2015  . 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 Anxiety state 02/22/2015  . 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 Depression 03/28/2007  . GERD with esophagitis 02/24/2007  . 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 ESOPHAGOGASTRODUODENOSCOPY  06/17/2011   Procedure: ESOPHAGOGASTRODUODENOSCOPY (EGD);  Surgeon: Wonda Horner, MD;  Location: [facility];  Service: Endoscopy;  Laterality: N/A;  . ESOPHAGOGASTRODUODENOSCOPY Left 08/02/2016   Procedure: ESOPHAGOGASTRODUODENOSCOPY (EGD);  Surgeon: Teena Irani, MD;  Location: [facility];  Service: Endoscopy;  Laterality: Left;  Marland Kitchen GASTRECTOMY         Home Medications  Prior to Admission medications   Medication Sig Start Date End Date Taking? Authorizing Provider  benztropine (COGENTIN) 0.5 MG tablet Take 0.5 mg by mouth daily.   Yes [provider]  carbamazepine (TEGRETOL) 200 MG tablet Take 1 tablet (200 mg total) by mouth 3 (three) times daily. 05/17/16  Yes Eber Jones, MD  clonazePAM (KLONOPIN) 0.5 MG tablet Take 0.5 mg by mouth at bedtime. 02/26/17  Yes [provider]  escitalopram (LEXAPRO) 10 MG tablet Take 10 mg by mouth every morning.    Yes [provider]  ferrous sulfate 325 (65 FE) MG tablet Take 1 tablet (325 mg total) by mouth 2 (two) times daily with a meal. 07/26/16  Yes Regalado, Belkys A, MD  hydrocortisone (CORTEF) 5 MG tablet Take 5-10 mg by mouth See admin instructions. Take 2 tablets every morning and take 1 tablet every evening   Yes [provider]  lamoTRIgine (LAMICTAL) 200 MG tablet Take 400 mg by mouth at bedtime. 02/27/17  Yes [provider]  midodrine (PROAMATINE) 10 MG tablet Take 1 tablet (10 mg total) by mouth 3 (three) times daily with meals. 05/17/16  Yes Eber Jones, MD  Olopatadine HCl 0.2 % SOLN Place 1 drop into both eyes daily. 02/26/17  Yes [provider]  risperiDONE (RISPERDAL) 1 MG tablet Take 1 mg by mouth 2 (two) times daily. 02/26/17  Yes [provider]  traZODone (DESYREL) 50 MG tablet Take 50 mg by mouth at bedtime. 02/26/17  Yes [provider]  zolpidem (AMBIEN) 10 MG tablet Take 10 mg by mouth at bedtime.   Yes [provider]  famotidine (PEPCID) 20 MG tablet Take 1 tablet (20 mg total) by mouth 2 (two) times daily. 04/29/17   Kinnie Feil, PA-C  pantoprazole (PROTONIX) 40 MG tablet Take 1 tablet (40 mg total) by mouth daily. 04/29/17   Kinnie Feil, PA-C  sucralfate (CARAFATE) 1 GM/10ML suspension Take 10 mLs (1 g total) by mouth 4 (four) times daily -  with meals and at bedtime. 04/29/17   Kinnie Feil, PA-C    Family History Family History  Family history unknown: Yes    Social History Social History   Tobacco Use  . Smoking status: Former Smoker    Packs/day: 0.50    Years: 27.00    Pack years: 13.50    Last attempt to quit: 05/26/2010    Years since quitting: 6.9  . Smokeless tobacco: Former Systems developer    Quit date: 06/08/2001  Substance Use Topics  . Alcohol use: No  . Drug use: No     Allergies    Pineapple   Review of Systems Review of Systems  Cardiovascular: Positive for chest pain.  Gastrointestinal: Positive for abdominal pain, nausea and vomiting.  All other systems reviewed and are negative.    Physical Exam Updated Vital Signs BP 108/81 (BP Location: Left Arm)   Pulse 72   Temp 97.6 F (36.4 C) (Oral)   Resp 13   Wt 81.6 kg (180 lb)   SpO2 96%   BMI 23.11 kg/m   Physical Exam  Constitutional: He is oriented to person, place, and time. He appears well-developed and well-nourished. No distress.  NAD. Chronically ill-appearing male, appears older than stated age.  HENT:  Head: Normocephalic and atraumatic.  Right Ear: External ear normal.  Left Ear: External ear normal.  Nose: Nose normal.  Moist mucous membranes.  Eyes: Conjunctivae and EOM are normal. No  scleral icterus.  Neck: Normal range of motion. Neck supple.  Cardiovascular: Normal rate, regular rhythm, normal heart sounds and intact distal pulses.  No murmur heard. 2+ DP and radial pulses bilaterally. No lower extremity edema. No calf tenderness.   Pulmonary/Chest: Effort normal and breath sounds normal. He has no wheezes.  Abdominal: Soft.  Abdomen is soft, non tender without distention, rigidity, guarding or rebound.  No signs of peritonitis. No suprapubic tenderness. No CVAT.  Negative Murphy's. Negative McBurney's.  Non palpable kidneys. No hepatosplenomegaly.  No pulsating masses.  Active bowel sounds throughout.   Musculoskeletal: Normal range of motion. He exhibits no deformity.  Neurological: He is alert and oriented to person, place, and time.  Speech is slow , but no obvious dysarthria. No nystagmus. Strength 5/5 with hand grip and ankle flexion/extension.   Sensation to light touch intact in hands and feet.  Skin: Skin is warm and dry. Capillary refill takes less than 2 seconds.  Psychiatric: He has a normal mood and affect. His behavior is normal. Judgment and thought content  normal.  Nursing note and vitals reviewed.    ED Treatments / Results  Labs (all labs ordered are listed, but only abnormal results are displayed) Labs Reviewed  CBC - Abnormal; Notable for the following components:      Result Value   RBC 3.97 (*)    Hemoglobin 12.8 (*)    All other components within normal limits  POC OCCULT BLOOD, ED - Abnormal; Notable for the following components:   Fecal Occult Bld POSITIVE (*)    All other components within normal limits  POC OCCULT BLOOD, ED - Abnormal; Notable for the following components:   Fecal Occult Bld POSITIVE (*)    All other components within normal limits  BASIC METABOLIC PANEL  URINALYSIS, ROUTINE W REFLEX MICROSCOPIC  I-STAT TROPONIN, ED  I-STAT TROPONIN, ED    EKG  EKG Interpretation  Date/Time:  Tuesday April 28 2017 19:11:23 EST Ventricular Rate:  76 PR Interval:    QRS Duration: 103 QT Interval:  383 QTC Calculation: 431 R Axis:   68 Text Interpretation:  Sinus rhythm Low voltage, precordial leads Baseline wander in lead(s) V5 Interpretation limited secondary to artifact needs repeat Confirmed by Ripley Fraise [phone]) on 04/29/2017 12:41:00 AM       Radiology Dg Chest 2 View  Result Date: 04/28/2017 CLINICAL DATA:  Intermittent vomiting for past week, some chest pain that comes and goes, blood tinged emesis EXAM: CHEST  2 VIEW COMPARISON:  03/26/2017 FINDINGS: Markedly kyphotic positioning with rotation to the RIGHT, positioning limited by patient condition. Enlargement of cardiac silhouette likely accentuated by positioning. Mediastinal contours and pulmonary vascularity grossly normal. Mild atelectasis at RIGHT base. Lungs otherwise clear. No infiltrate, pleural effusion or pneumothorax. Bones demineralized. IMPRESSION: Mild RIGHT basilar atelectasis. Electronically Signed   By: Lavonia Dana M.D.   On: 04/28/2017 19:57    Procedures Procedures (including critical care time)  Medications Ordered in  ED Medications  famotidine (PEPCID) IVPB 20 mg premix (not administered)  pantoprazole (PROTONIX) injection 40 mg (not administered)  sodium chloride 0.9 % bolus 1,000 mL (not administered)     Initial Impression / Assessment and Plan / ED Course  I have reviewed the triage vital signs and the nursing notes.  Pertinent labs & imaging results that were available during my care of the patient were reviewed by me and considered in my medical decision making (see chart for details).  Clinical Course as of  Apr 29 442  Wed Apr 29, 2017  0300 Fecal Occult Blood, POC: (!) POSITIVE [CG]  0402 Hemoglobin: (!) 12.8 [CG]    Clinical Course User Index [CG] Kinnie Feil, PA-C    51 year old male with history significant for peptic ulcer disease, history of upper GI bleed, chronic anemia, intellectual disability presents for intermittent, radiating epigastric abdominal pain that radiates to the central chest 3 days. Worse with eating. Non exertional Associated with nausea and vomiting with streaks of blood.  Not associated with SOB. Chart review reveals patient had EGD in March 2018 which showed significant esophagitis. During that admission he required blood transfusion.  On my exam, patient is asymptomatic. Abdomen is soft, nontender and nondistended. Vital signs are within normal limits. No hypotension or tachycardia. Rectal exam shows no gross melena or hematochezia, stool is brown. Multiple repeat abdominal exams unchanged from initial.   Abdominal pain and CP work up initiated. CBC, BMP, urinalysis, delta troponin, EKG and chest x-ray unremarkable. Barely decreased hemoglobin at 12.8. Positive Hemoccult, but no gross blood. Patient has remained asymptomatic in the emergency department. No episodes of nausea, vomiting or diarrhea. Vital signs have remained stable. Given chronicity of symptoms and known peptic ulcer disease and esophagitis, benign workup and exam today I don't think patient  requires further emergent lab work or imaging. Nursing staff at bedside states patient has not been taken protonix, Carafate or other acid reflux medicines for a few months.  Final Clinical Impressions(s) / ED Diagnoses   Patient given protonic, Pepcid, IV fluids. We will attempt fluid challenge. Anticipate discharge with close GI follow-up. We'll start on Protonix, Zantac and Carafate. Discussed return precautions with nursing aide at bedside who verbalizes understanding and agreeable. Final diagnoses:  Epigastric abdominal pain  Nausea and vomiting in adult    ED Discharge Orders        Ordered    pantoprazole (PROTONIX) 40 MG tablet  Daily     04/29/17 0442    sucralfate (CARAFATE) 1 GM/10ML suspension  3 times daily with meals & bedtime     04/29/17 0442    famotidine (PEPCID) 20 MG tablet  2 times daily     04/29/17 0442       Kinnie Feil, PA-C 04/29/17 0444    Ripley Fraise, MD 04/29/17 [phone]

## 2017-04-29 NOTE — Discharge Instructions (Signed)
You should restart Carafate, Protonix and Pepcid. See above diet to help with symptoms. Use Zofran as needed for nausea. Small, bland meals throughout the day are better than heavier, larger meals. Follow-up with gastroenterology as soon as possible for reevaluation. Return to the ED for develop fevers, chills, exertional chest pain, uncontrollable nausea and vomiting, inability to tolerate fluids, bloody diarrhea.

## 2017-07-03 IMAGING — CT CT ANGIO CHEST
2 of 6 series · 18 of 46 positions shown · IV contrast (APPLIED)
Comparison: None.

CLINICAL DATA: Shortness of Breath

EXAM:
CT ANGIOGRAPHY CHEST WITH CONTRAST
TECHNIQUE: Multidetector CT imaging of the chest was performed using the
standard protocol during bolus administration of intravenous
contrast. Multiplanar CT image reconstructions and MIPs were
obtained to evaluate the vascular anatomy.
CONTRAST:  100 mL Omnipaque 300

[Series 5: thins · axial · 0.68mm/px · z∈[-224,+10]mm · 16 of 258 slices shown]
[im 12/258  lung]
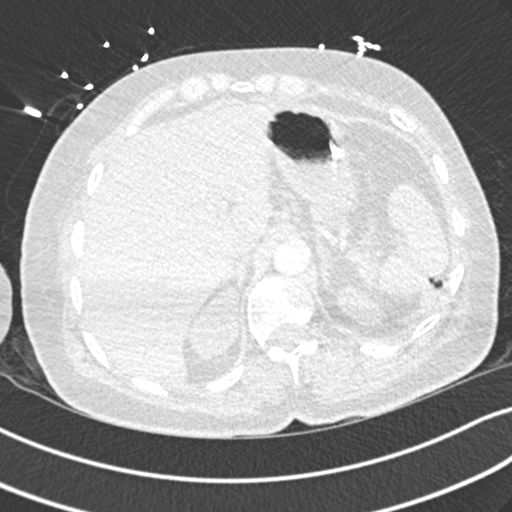
[im 34/258  soft-tissue]
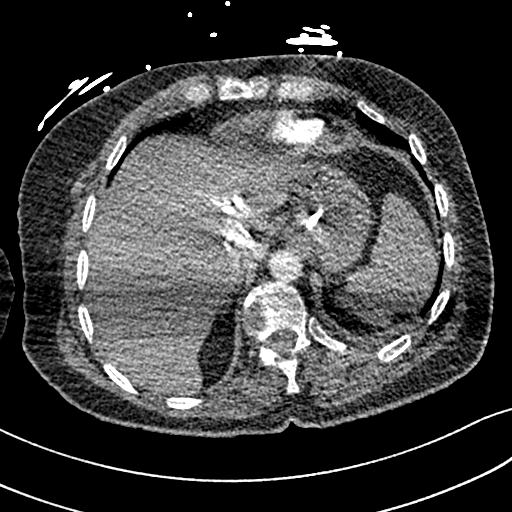
[im 45/258  lung]
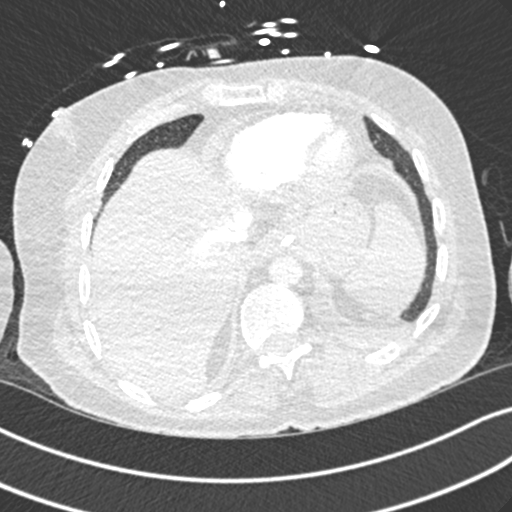
[im 56/258  soft-tissue]
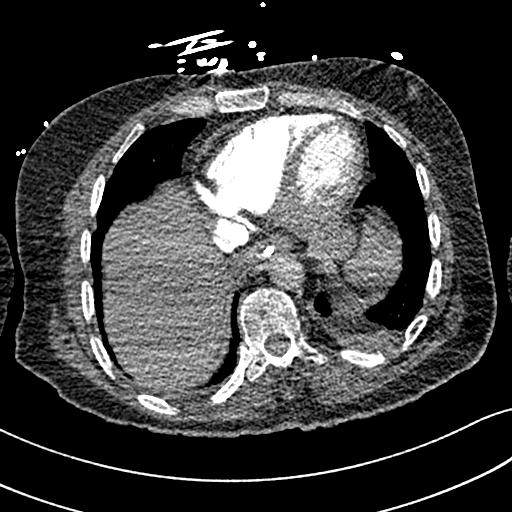
[im 79/258  lung]
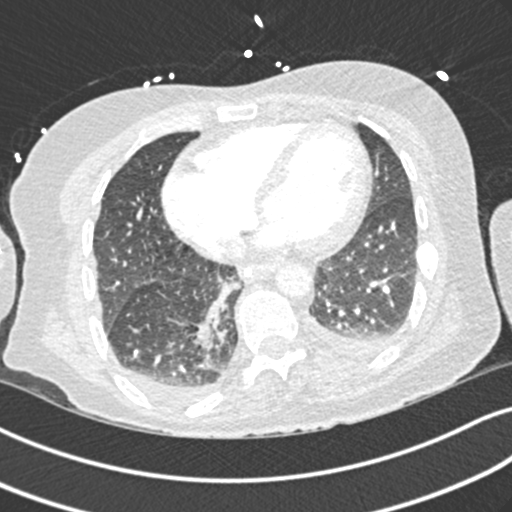
[im 90/258  soft-tissue]
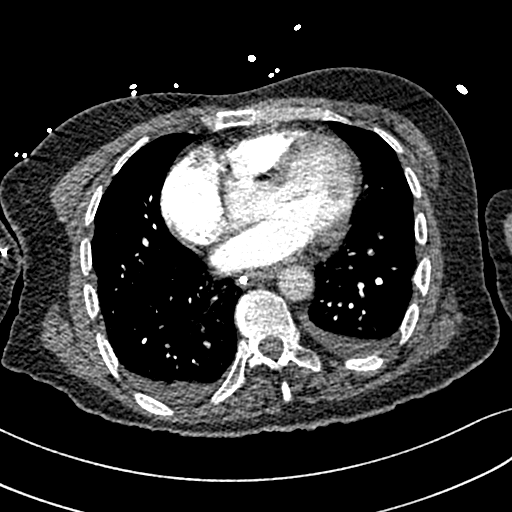
[im 101/258  lung]
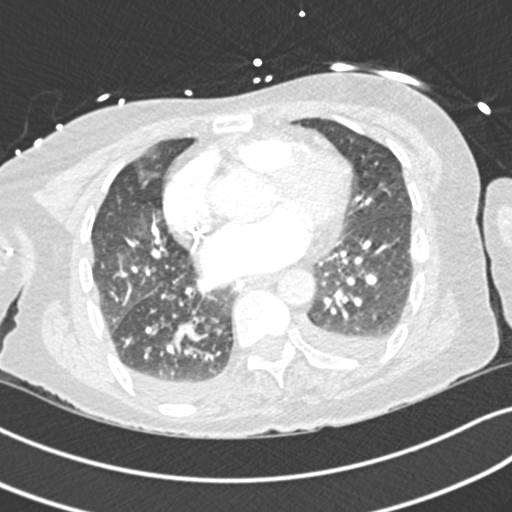
[im 123/258  soft-tissue]
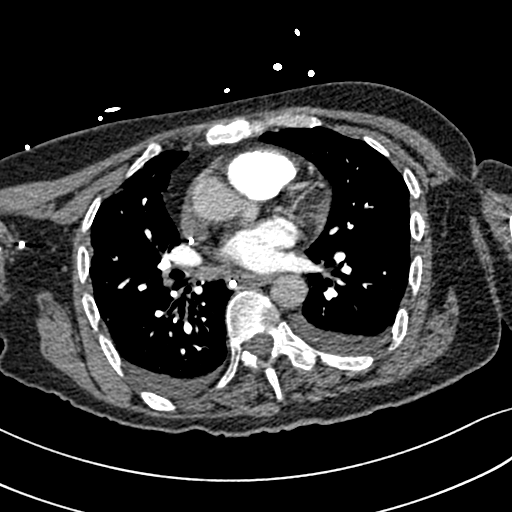
[im 135/258  lung]
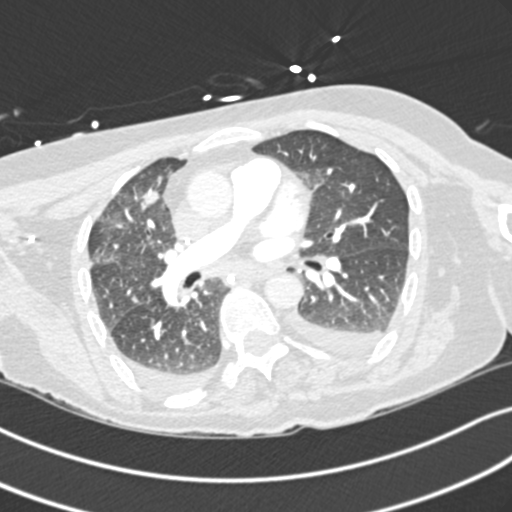
[im 157/258  soft-tissue]
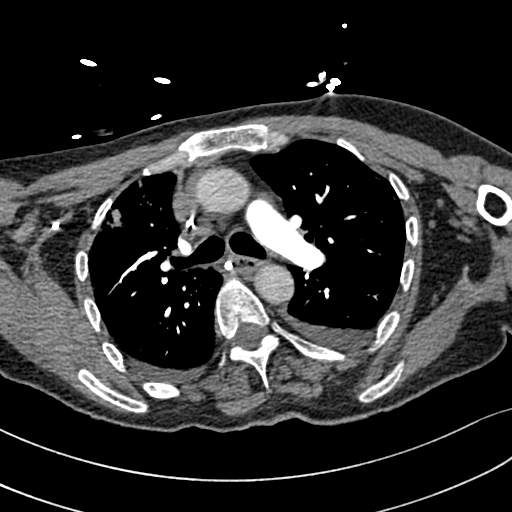
[im 168/258  lung]
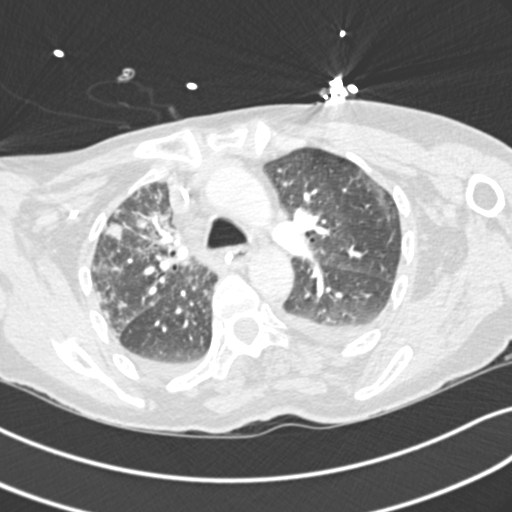
[im 179/258  soft-tissue]
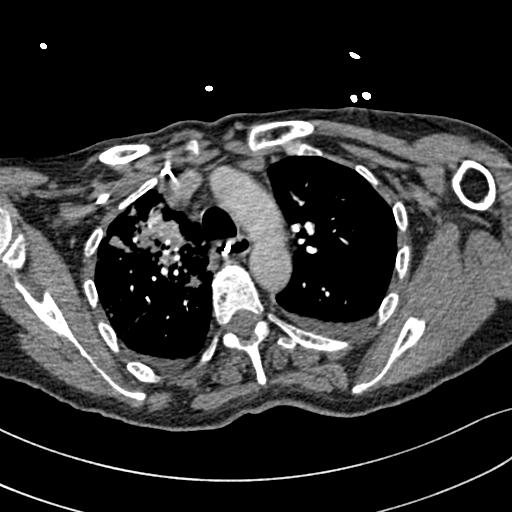
[im 202/258  lung]
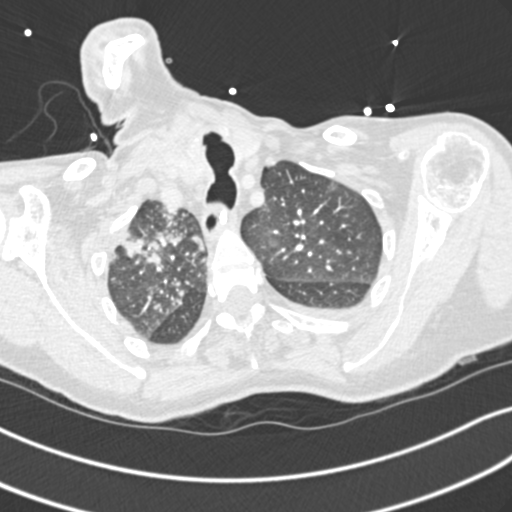
[im 213/258  soft-tissue]
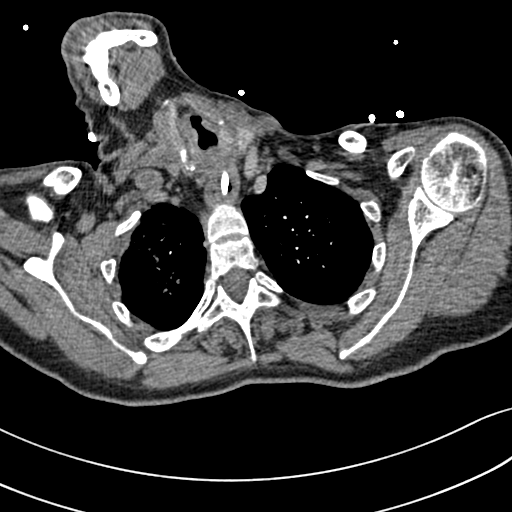
[im 224/258  lung]
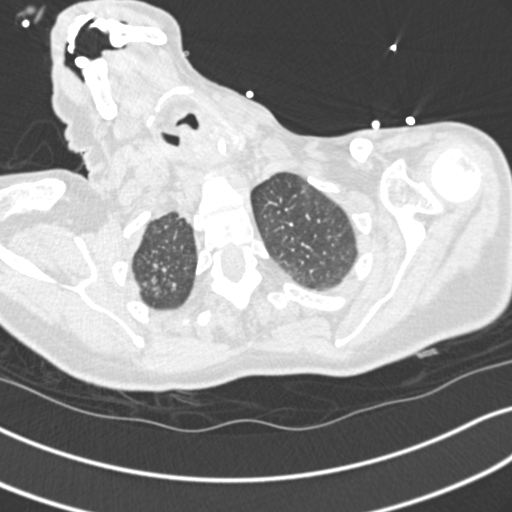
[im 246/258  soft-tissue]
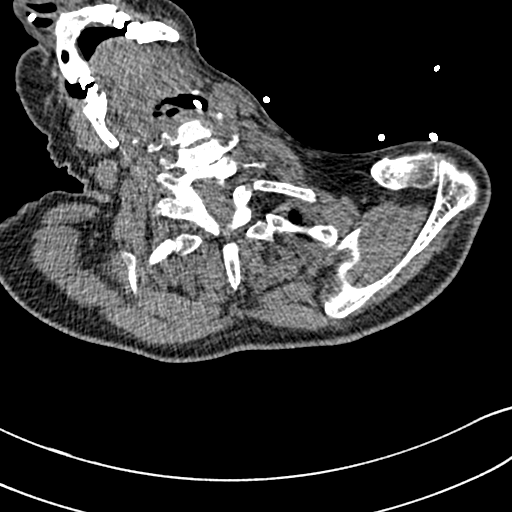

[Series 6: coronal mpr · coronal · 0.56mm/px · 2 of 91 slices shown]
[im 31/91  soft-tissue]
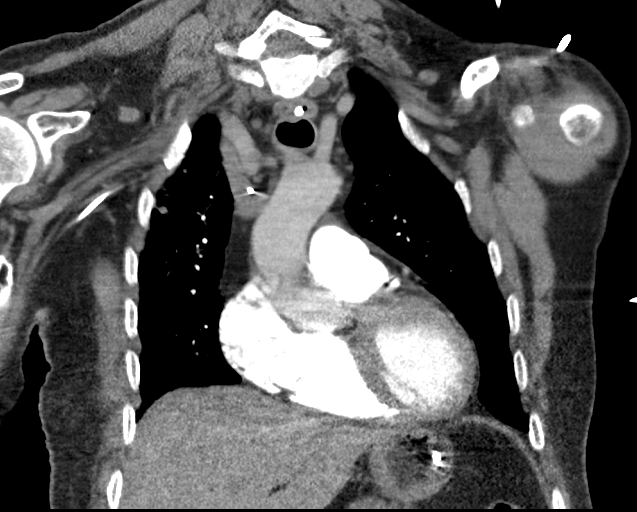
[im 61/91  soft-tissue]
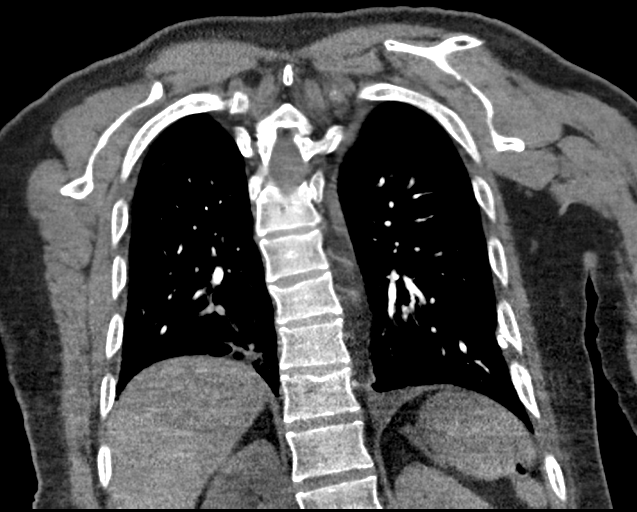

[18 of 46 positions shown; findings below may reference images not displayed]

FINDINGS: Cardiovascular: Thoracic aorta demonstrates mild calcification
without aneurysmal dilatation. Pulmonary artery is well visualized
and demonstrates a normal branching pattern. No filling defects to
suggest pulmonary embolism are seen. A small air bubble is noted
within the main pulmonary artery just above the pulmonic valve
likely related to IV administration. Cardiac structures are mildly
prominent.

Mediastinum/Nodes: Thoracic inlet is within normal limits. No hilar
or mediastinal lymphadenopathy is identified. Right PICC line is
noted in satisfactory position.

Lungs/Pleura: Small left pleural effusion is identified. No focal
infiltrate is noted on the left. Small right pleural effusion is
noted as well. Diffuse infiltrate is noted in the right upper lobe.
Some slight nodularity is identified.

Upper Abdomen: Feeding catheter is noted extending into the stomach.
No acute abnormality in the upper abdomen is seen.

Musculoskeletal: Within normal limits.

Review of the MIP images confirms the above findings.
IMPRESSION: No evidence of pulmonary embolism.

Diffuse right upper lobe infiltrate with a somewhat nodular
appearance. Short-term followup with noncontrast CT following
appropriate therapy is recommended.

Bilateral small pleural effusions.

## 2017-07-03 IMAGING — RF DG SWALLOWING FUNCTION - NRPT [facility]
10 series · 23 of 24 positions shown · non-contrast
Comparison: none

[Series 1: cp_standard · 0.52mm/px · 3 of 126 frames shown (1 of 10)]
[frame 19/126]
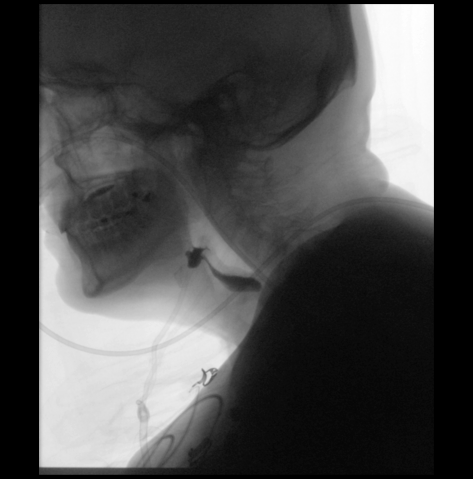
[frame 97/126]
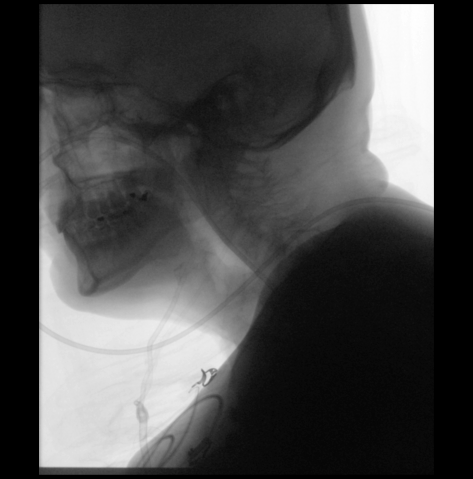
[frame 108/126]
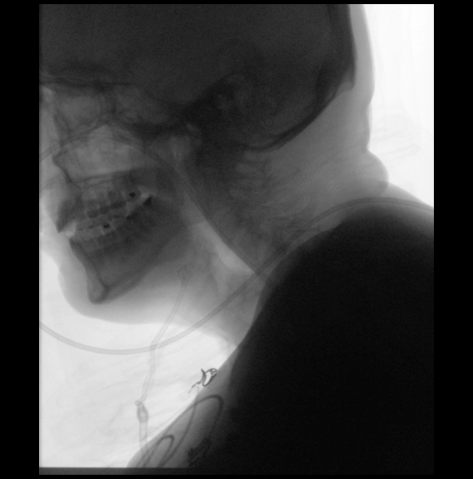

[Series 2: cp_standard · 0.52mm/px · 2 of 108 frames shown (2 of 10)]
[frame 17/108]
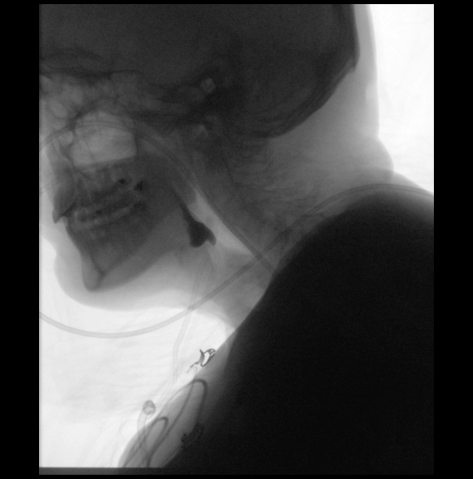
[frame 92/108]
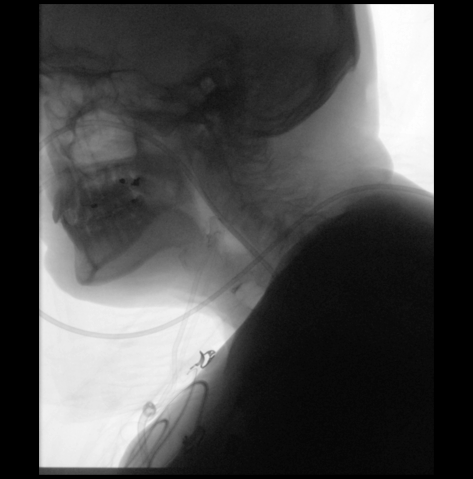

[Series 3: cp_standard · 0.52mm/px · 2 of 190 frames shown (3 of 10)]
[frame 29/190]
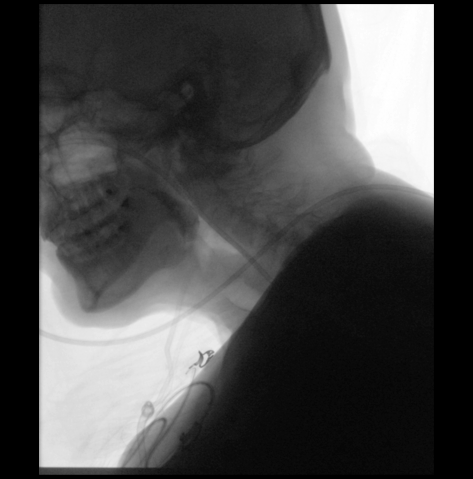
[frame 96/190]
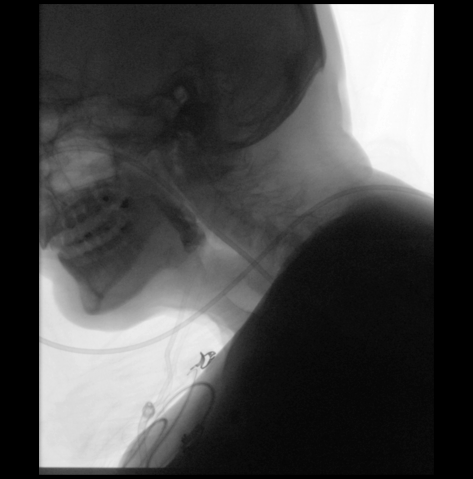

[Series 4: cp_standard · 0.52mm/px · 3 of 110 frames shown (4 of 10)]
[frame 1/110]
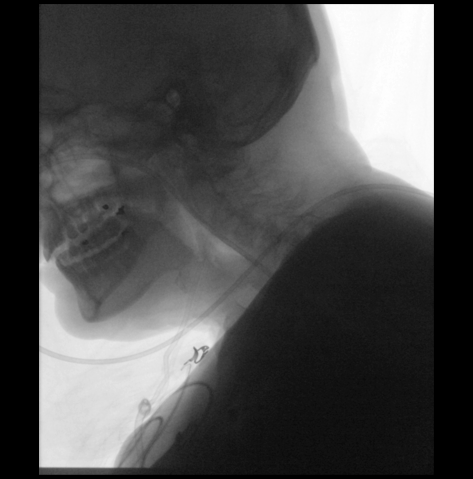
[frame 56/110]
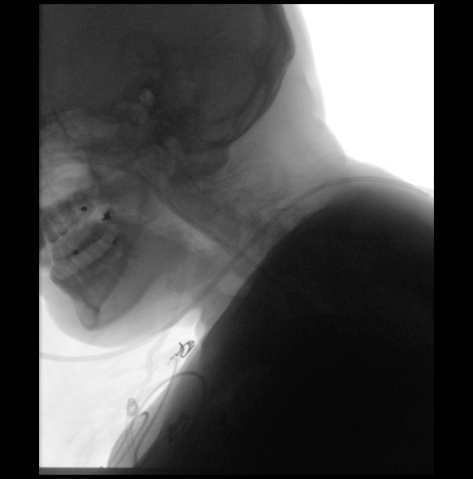
[frame 94/110]
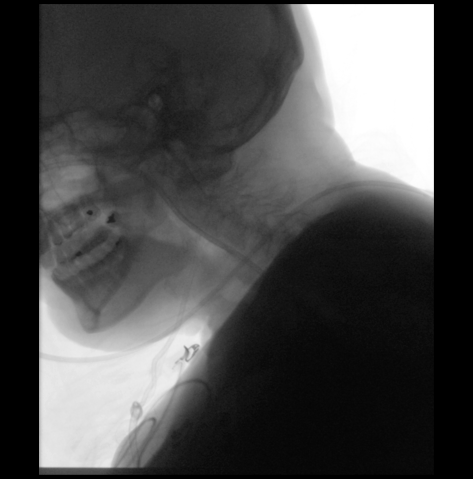

[Series 5: cp_standard · 0.52mm/px · 1 of 370 frames shown (5 of 10)]
[frame 56/370]
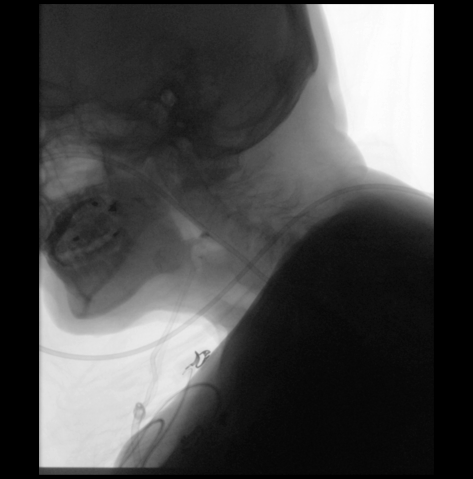

[Series 6: cp_standard · 0.52mm/px · 2 of 158 frames shown (6 of 10)]
[frame 24/158]
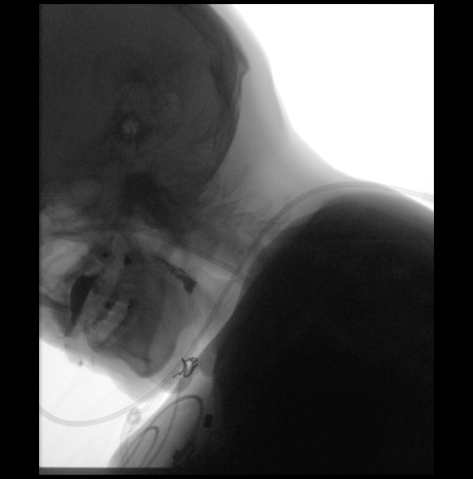
[frame 135/158]
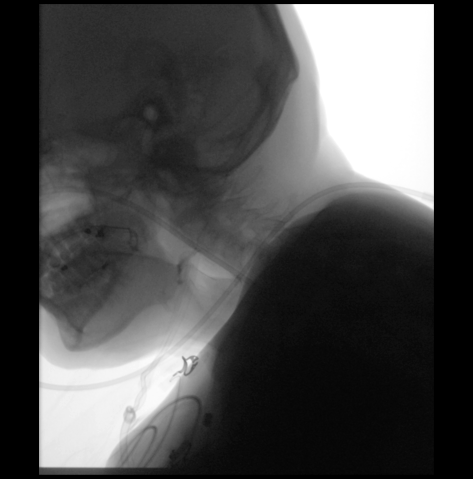

[Series 7: cp_standard · 0.52mm/px · 3 of 264 frames shown (7 of 10)]
[frame 40/264]
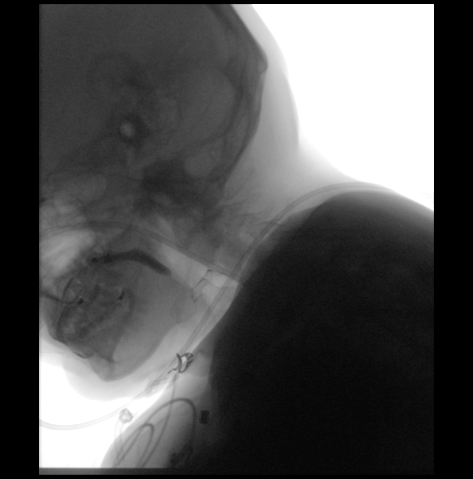
[frame 63/264]
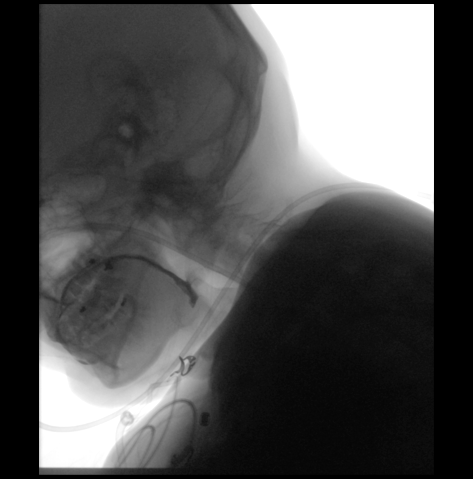
[frame 225/264]
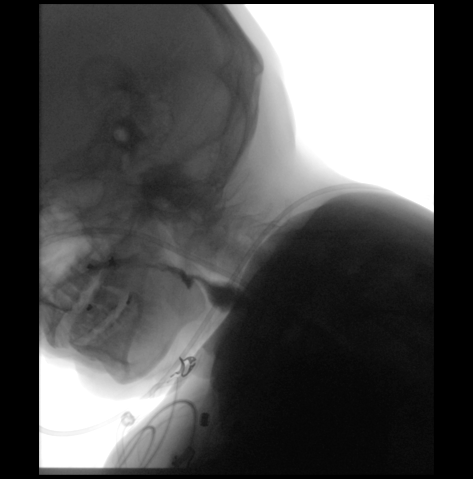

[Series 8: cp_standard · 0.52mm/px · 2 of 69 frames shown (8 of 10)]
[frame 35/69]
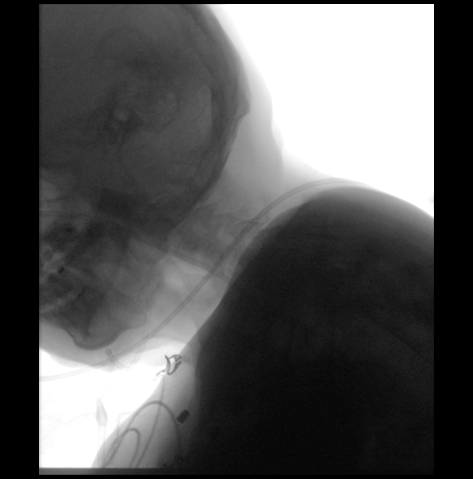
[frame 65/69]
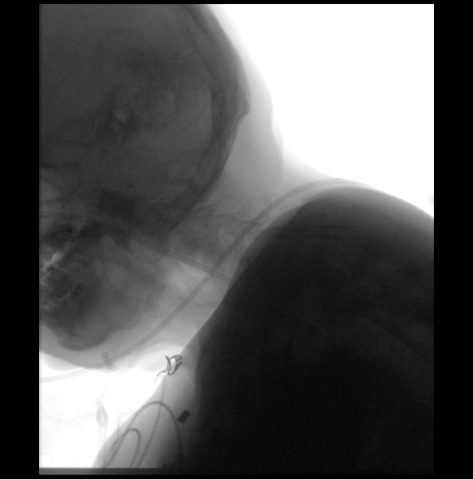

[Series 9: cp_standard · 0.52mm/px · 2 of 83 frames shown (9 of 10)]
[frame 1/83]
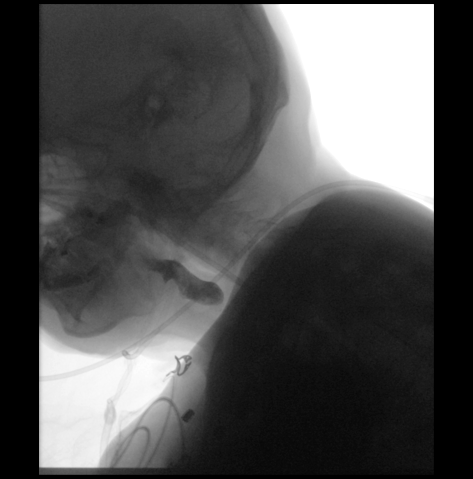
[frame 42/83]
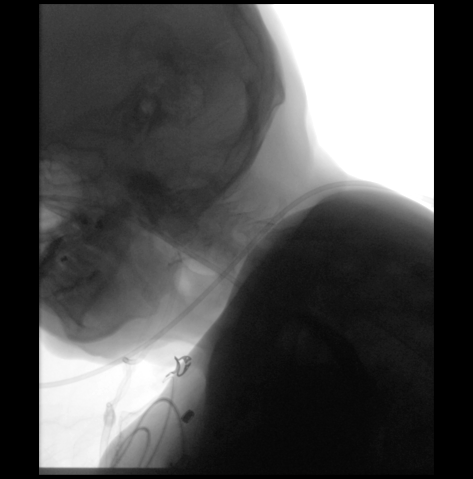

[Series 10: cp_standard · 0.52mm/px · 3 of 58 frames shown (10 of 10)]
[frame 9/58]
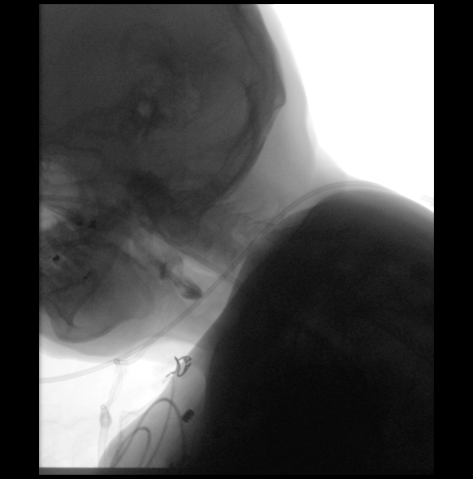
[frame 30/58]
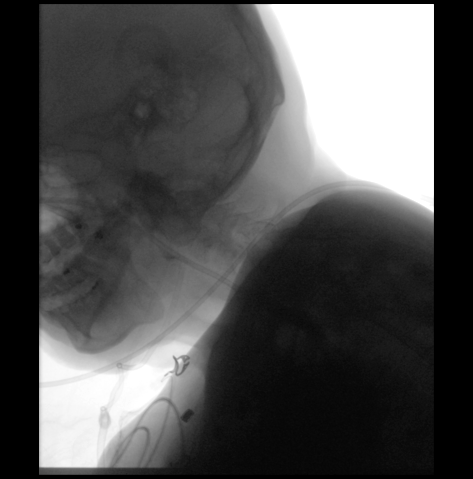
[frame 58/58]
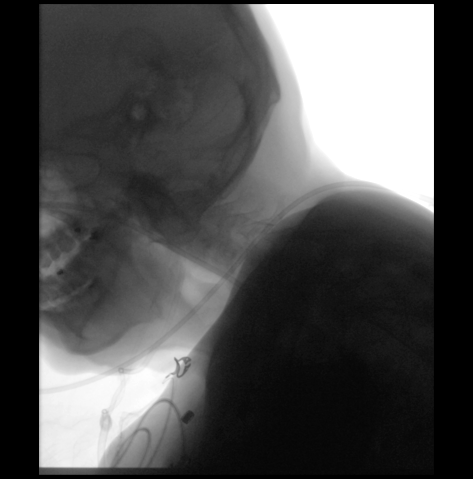

[23 of 24 positions shown; findings below may reference images not displayed]

FLUOROSCOPY FOR SWALLOWING FUNCTION STUDY:
Fluoroscopy was provided for swallowing function study, which was administered by a speech pathologist.  Final results and recommendations from this study are contained within the speech pathology report.

## 2017-07-04 IMAGING — DX DG CHEST 1V PORT
1 series · 1 of 1 positions shown · non-contrast
Comparison: 05/09/2016 chest radiograph and chest CT.

CLINICAL DATA: 50 y/o  M; acute on chronic respiratory failure.

EXAM:
PORTABLE CHEST 1 VIEW

[chest ap]
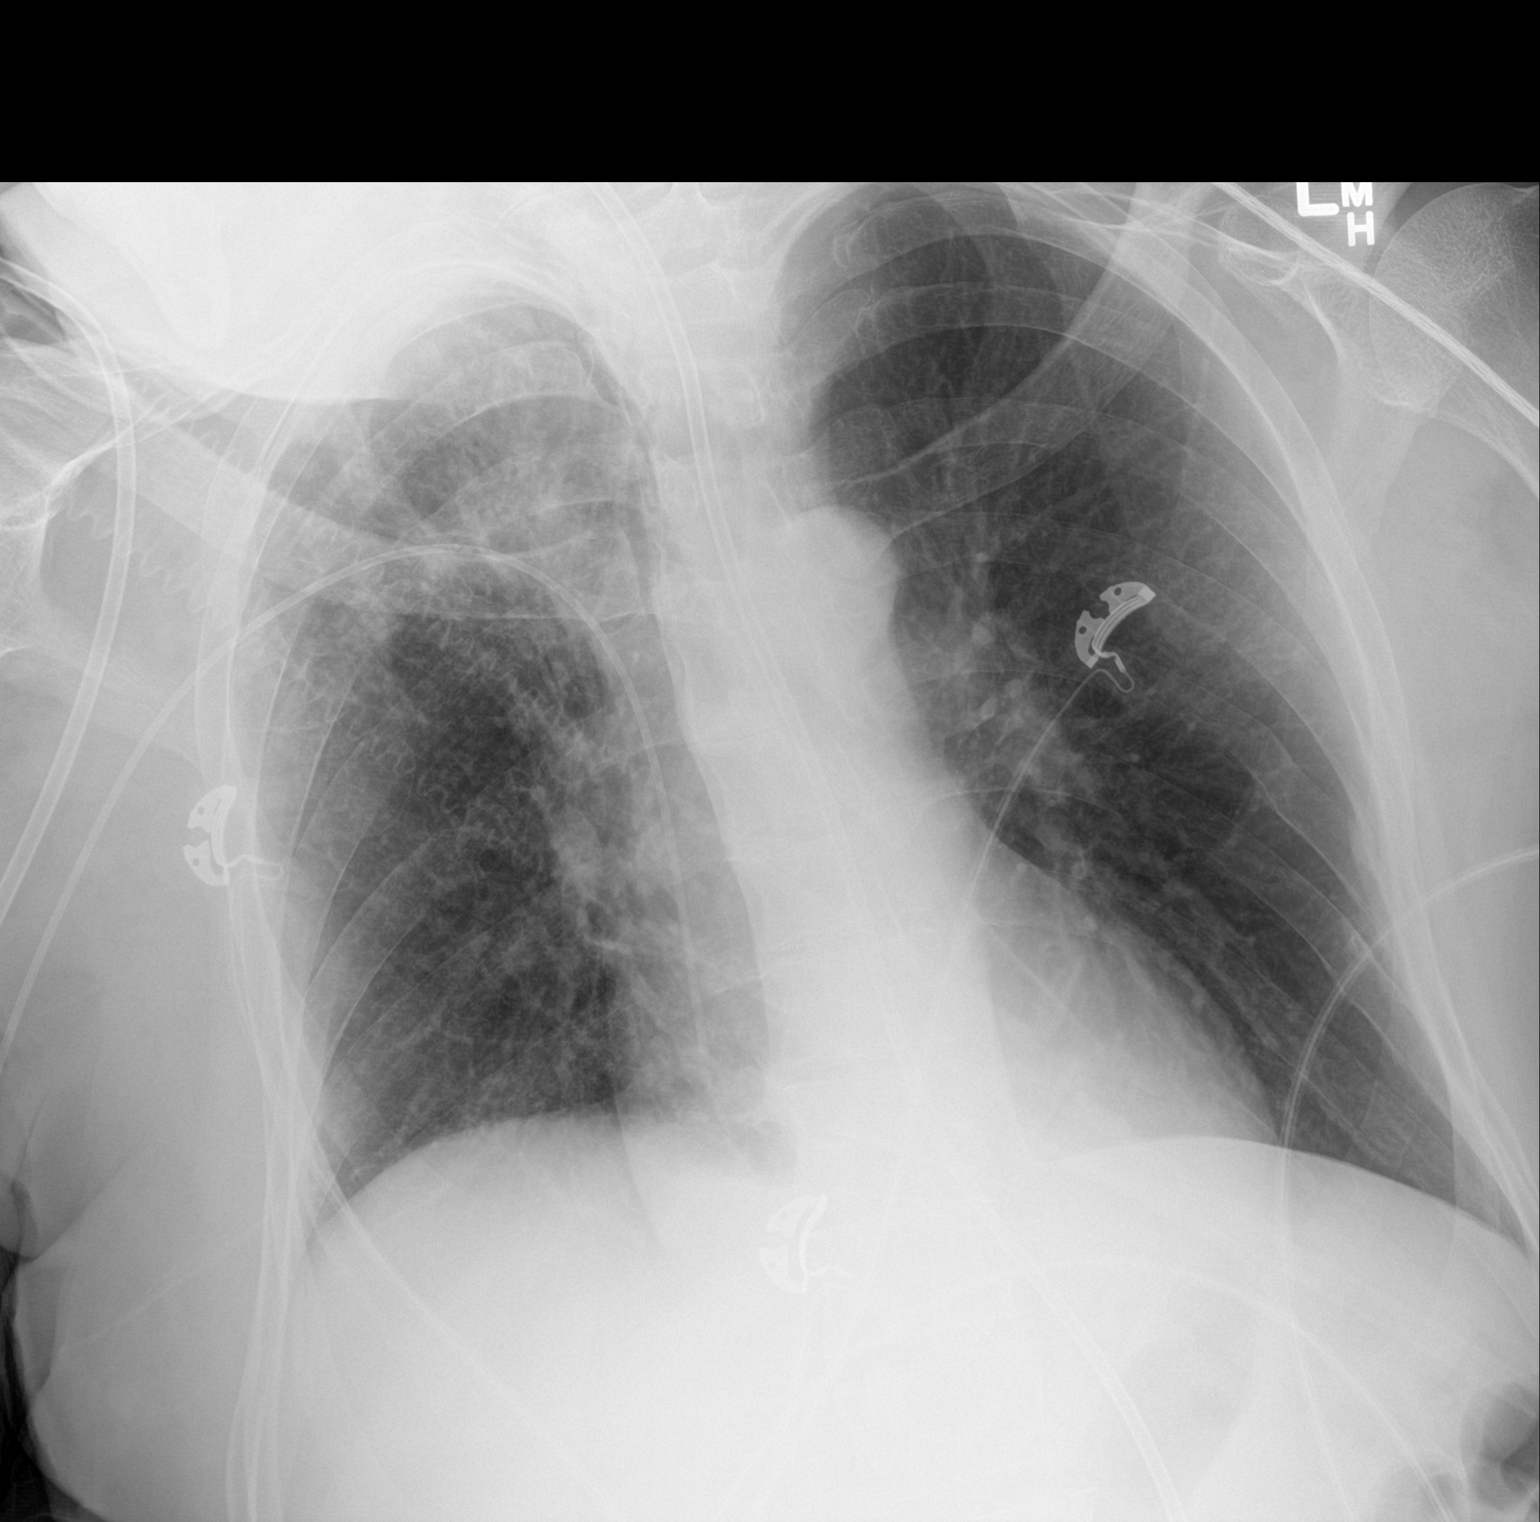

[1 of 1 positions shown; findings below may reference images not displayed]

FINDINGS: Ill-defined opacity in the right lung apex is better characterized
on prior CT. Stable cardiac silhouette. Enteric tube tip below the
field of view in the abdomen. Right PICC line tip projects over the
cavoatrial junction. No new focal airspace opacity. Bones are
unremarkable.
IMPRESSION: Stable ill-defined consolidation in the right upper lobe. No new
focal airspace opacity.

By: Fhuemaus Labocha M.D.

## 2017-07-06 IMAGING — DX DG CHEST 1V PORT
1 series · 1 of 1 positions shown · non-contrast
Comparison: 05/11/2016.

CLINICAL DATA: Respiratory failure.

EXAM:
PORTABLE CHEST 1 VIEW

[chest ap]
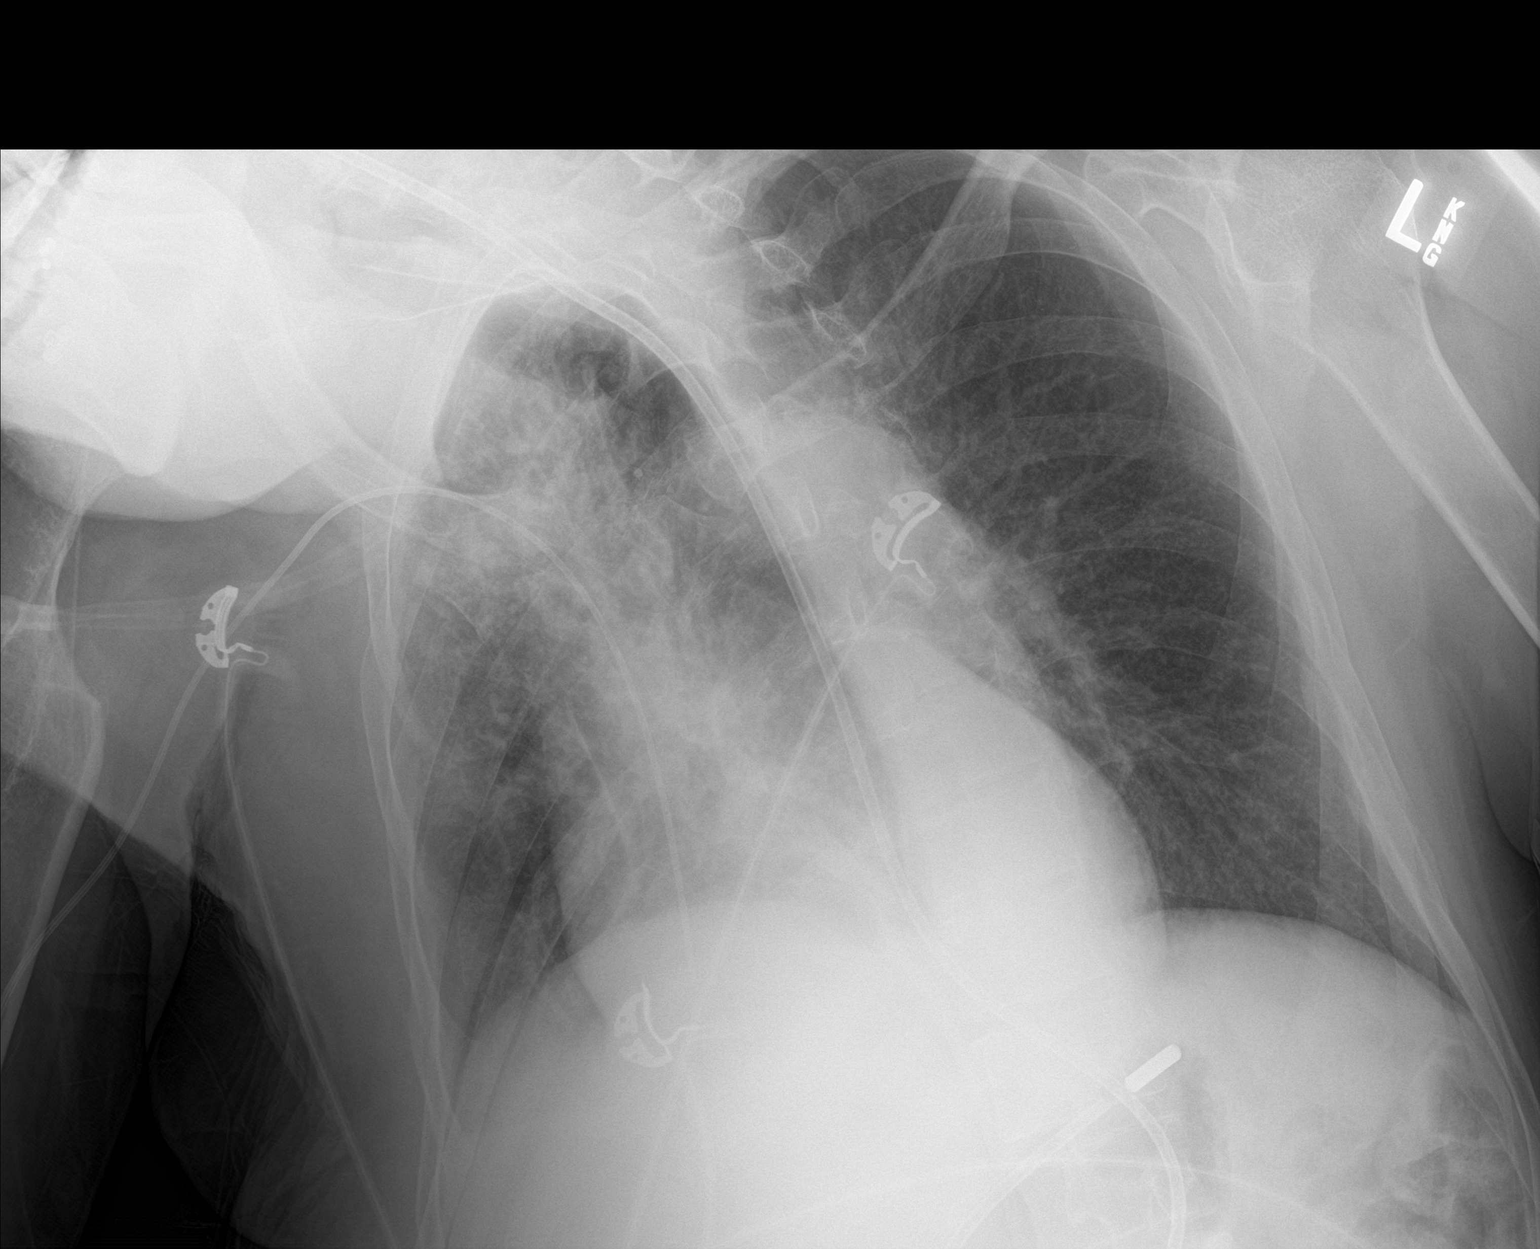

[1 of 1 positions shown; findings below may reference images not displayed]

FINDINGS: Patient rotated to the right. Right PICC line and the feeding tube
in stable position. Cardiomegaly. Progressive right upper lobe
infiltrate. Right base atelectasis. Small right pleural effusion. No
pneumothorax.
IMPRESSION: 1.  Right PICC line and feeding tube in stable position.

2. Progressive right upper lobe infiltrate.  Right base atelectasis.

## 2017-10-16 ENCOUNTER — Encounter: Payer: Self-pay | Admitting: Nurse Practitioner

## 2017-11-11 ENCOUNTER — Other Ambulatory Visit: Payer: Self-pay | Admitting: Gastroenterology

## 2017-12-24 ENCOUNTER — Other Ambulatory Visit: Payer: Self-pay | Admitting: Gastroenterology

## 2017-12-30 NOTE — Anesthesia Preprocedure Evaluation (Addendum)
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52    GI/Hepatic Neg liver ROS, PUD, UGIB, hematemesis   Endo/Other  Addison's  Renal/GU negative Renal ROS     Musculoskeletal negative musculoskeletal ROS (+)   Abdominal   Peds  Hematology  (+) anemia ,   Anesthesia Other Findings   Reproductive/Obstetrics                             Anesthesia Physical Anesthesia Plan  ASA: III  Anesthesia Plan: MAC   Post-op Pain Management:    Induction: Intravenous  PONV Risk Score and Plan: 1 and Treatment may vary due to age or medical condition and Propofol infusion  Airway Management Planned: Simple Face Mask  Additional Equipment:   Intra-op Plan:   Post-operative Plan:   Informed Consent:   Plan Discussed with: CRNA  Anesthesia Plan Comments: (Case cancelled for NPO status. )       Anesthesia Quick Evaluation

## 2017-12-31 ENCOUNTER — Encounter (HOSPITAL_COMMUNITY): Payer: Self-pay | Admitting: Anesthesiology

## 2017-12-31 ENCOUNTER — Encounter (HOSPITAL_COMMUNITY)
Admission: RE | Disposition: A | Discharge status: Home / Self Care | Payer: Self-pay | Source: Ambulatory Visit | Attending: Gastroenterology

## 2017-12-31 ENCOUNTER — Ambulatory Visit (HOSPITAL_COMMUNITY)
Admission: RE | Admit: 2017-12-31 | Discharge: 2017-12-31 | Disposition: A | Discharge status: Home / Self Care | Payer: Medicare Other | Source: Ambulatory Visit | Attending: Gastroenterology | Admitting: Gastroenterology

## 2017-12-31 SURGERY — CANCELLED PROCEDURE
Anesthesia: Monitor Anesthesia Care

## 2017-12-31 MED ORDER — PROPOFOL 10 MG/ML IV BOLUS
INTRAVENOUS | Status: AC
  Filled 2017-12-31: qty 40

## 2017-12-31 SURGICAL SUPPLY — 24 items

## 2017-12-31 NOTE — Progress Notes (Signed)
Patient arrived from group home.  Patient stated that he ate oatmeal and eggs for breakfast.  Group home called, they stated patient had drank an ensure at 0800.  Dr. Michail Sermon notified and procedure cancelled.  Patient aware and discharged back to group home with transportation.   Vista Lawman, RN

## 2018-07-23 ENCOUNTER — Ambulatory Visit (INDEPENDENT_AMBULATORY_CARE_PROVIDER_SITE_OTHER): Payer: Medicare Other | Admitting: Neurology

## 2018-07-23 ENCOUNTER — Encounter

## 2018-07-23 ENCOUNTER — Encounter: Payer: Self-pay | Admitting: Neurology

## 2018-07-23 VITALS — BP 106/78 | HR 69

## 2018-07-23 DIAGNOSIS — G40309 Generalized idiopathic epilepsy and epileptic syndromes, not intractable, without status epilepticus: Secondary | ICD-10-CM

## 2018-07-23 DIAGNOSIS — Z5181 Encounter for therapeutic drug level monitoring: Secondary | ICD-10-CM

## 2018-07-23 DIAGNOSIS — S0990XS Unspecified injury of head, sequela: Secondary | ICD-10-CM

## 2018-07-23 DIAGNOSIS — G8250 Quadriplegia, unspecified: Secondary | ICD-10-CM | POA: Diagnosis not present

## 2018-07-23 NOTE — Progress Notes (Signed)
Reason for visit: History of closed head injury, seizures  [name] is an 53 y.o. male  History of present illness:  [name] is a 65 year old right-handed white male with a history of a prior head injury and a history of seizures.  The patient is on Lamictal and carbamazepine.  He remains on Risperdal as well.  He has significant gait instability problems, he can walk if someone is holding them up.  Even sitting, he has a tendency to lean to the right.  The patient has not fallen out of the chair.  He has not had any seizures since last seen in 2017.  He comes back in for routine evaluation.  It is not clear when the last time he had blood work.  He returns for an evaluation.  The patient does have occasional headaches, this is not a significant issue for him.  Overall, he is doing well.  Past Medical History:  Diagnosis Date  . Anxiety   . Bipolar disorder ([place])   . BPH (benign prostatic hyperplasia)   . Closed head injury   . Constipation   . Depression   . Esophageal ulcer   . Malnutrition ([place])   . 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 Peptic ulcer disease   . Seizures ([place])   . Smoker     Past Surgical History:  Procedure Laterality Date  . ESOPHAGOGASTRODUODENOSCOPY  06/17/2011   Procedure: ESOPHAGOGASTRODUODENOSCOPY (EGD);  Surgeon: Wonda Horner, MD;  Location: [facility];  Service: Endoscopy;  Laterality: N/A;  . ESOPHAGOGASTRODUODENOSCOPY Left 08/02/2016   Procedure: ESOPHAGOGASTRODUODENOSCOPY (EGD);  Surgeon: Teena Irani, MD;  Location: [facility];  Service: Endoscopy;  Laterality: Left;  Marland Kitchen GASTRECTOMY      Family History  Family history unknown: Yes    Social history:  reports that he quit smoking about 8 years ago. He has a 13.50 pack-year smoking history. He quit smokeless tobacco use about 17 years ago. He reports that he does not drink alcohol or use drugs.      Allergies  Allergen Reactions  . Pineapple Anaphylaxis    Medications:  Prior to Admission medications   Medication Sig Start Date End Date Taking? Authorizing Provider  acetaminophen (TYLENOL) 500 MG tablet Take 500 mg by mouth every 6 (six) hours as needed.   Yes [provider]  benztropine (COGENTIN) 0.5 MG tablet Take 0.5 mg by mouth daily.   Yes [provider]  clonazePAM (KLONOPIN) 0.5 MG tablet Take 0.5 mg by mouth at bedtime. 02/26/17  Yes [provider]  escitalopram (LEXAPRO) 10 MG tablet Take 20 mg by mouth daily.    Yes [provider]  famotidine (PEPCID) 20 MG tablet Take 1 tablet (20 mg total) by mouth 2 (two) times daily. 04/29/17  Yes Carmon Sails J, PA-C  feeding supplement, ENSURE ENLIVE, (ENSURE ENLIVE) LIQD Take 237 mLs by mouth 2 (two) times daily between meals.   Yes [provider]  ferrous sulfate 325 (65 FE) MG tablet Take 1 tablet (325 mg total) by mouth 2 (two) times daily with a meal. 07/26/16  Yes Regalado, Belkys A, MD  hydrocortisone (CORTEF) 5 MG tablet Take 5-10 mg by mouth See admin instructions. Take 10mg  by mouth every morning and 5mg  every evening   Yes [provider]  ibuprofen (ADVIL,MOTRIN) 200 MG tablet Take 200 mg by mouth every 6 (six) hours as needed.  Yes [provider]  lamoTRIgine (LAMICTAL) 200 MG tablet Take 400 mg by mouth every evening.  02/27/17  Yes [provider]  midodrine (PROAMATINE) 10 MG tablet Take 1 tablet (10 mg total) by mouth 3 (three) times daily with meals. 05/17/16  Yes Eber Jones, MD  Olopatadine HCl 0.2 % SOLN Place 1 drop into both eyes daily. 02/26/17  Yes [provider]  pantoprazole (PROTONIX) 40 MG tablet Take 1 tablet (40 mg total) by mouth daily. Patient taking differently: Take 40 mg by mouth 2 (two) times daily.  04/29/17  Yes Carmon Sails J, PA-C  risperiDONE (RISPERDAL) 1 MG tablet Take 1 mg by mouth 2 (two) times  daily. 02/26/17  Yes [provider]  sucralfate (CARAFATE) 1 GM/10ML suspension Take 10 mLs (1 g total) by mouth 4 (four) times daily -  with meals and at bedtime. 04/29/17  Yes Kinnie Feil, PA-C  traZODone (DESYREL) 50 MG tablet Take 50 mg by mouth at bedtime. 02/26/17  Yes [provider]  zolpidem (AMBIEN) 10 MG tablet Take 10 mg by mouth at bedtime.   Yes [provider]  carbamazepine (TEGRETOL) 200 MG tablet Take 1 tablet (200 mg total) by mouth 3 (three) times daily. Patient not taking: Reported on 07/23/2018 05/17/16   Eber Jones, MD    ROS:  Out of a complete 14 system review of symptoms, the patient complains only of the following symptoms, and all other reviewed systems are negative.  Eye itching, eye redness, light sensitivity, eye pain, blurred vision Choking  Blood pressure 106/78, pulse 69, SpO2 96 %.  Physical Exam  General: The patient is alert and cooperative at the time of the examination.  Skin: No significant peripheral edema is noted.   Neurologic Exam  Mental status: The patient is alert and oriented x 3 at the time of the examination, the patient will cooperate with the examiner.   Cranial nerves: Facial symmetry is present. Speech is normal, no aphasia or dysarthria is noted. Extraocular movements are full, with exception that the right eye is temporarily deviated on primary gaze, more prominent with superior gaze. Visual fields are full.  Motor: The patient has good strength in the upper extremities.  With the lower extremities, there is some increased in motor tone, the patient is able to generate near normal strength with hip flexion and knee extension.  Sensory examination: Soft touch sensation is symmetric on the face, arms, and legs.  Coordination: The patient has dysmetria with finger-nose-finger bilaterally, he has difficulty performing heel shin on either side.  Gait and station: The patient can stand  with assistance, with assistance, he can walk with a slightly wide-based stance, he is quite unsteady on his feet.  He cannot stand independently.  Reflexes: Deep tendon reflexes are symmetric.   Assessment/Plan:  1.  History of traumatic brain injury  2.  History of seizures, well controlled  3.  Gait disturbance  The patient will be continued on his carbamazepine and Lamictal, we will check blood work today.  He will follow-up in 1 year, he seems to be well controlled on his current seizure medications.  Jill Alexanders MD 07/23/2018 11:27 AM  [facility] [address] [place], [place] [address]  Phone [phone] Fax [phone]   Greater than 50% of the visit was spent in counseling and coordination of care.  Face-to-face time with the patient was 20 minutes.

## 2018-07-26 LAB — CBC WITH DIFFERENTIAL/PLATELET
BASOS ABS: 0 10*3/uL (ref 0.0–0.2)
BASOS: 1 %
EOS (ABSOLUTE): 0.1 10*3/uL (ref 0.0–0.4)
Eos: 3 %
Hematocrit: 39.4 % (ref 37.5–51.0)
Hemoglobin: 13 g/dL (ref 13.0–17.7)
Immature Grans (Abs): 0 10*3/uL (ref 0.0–0.1)
Immature Granulocytes: 0 %
Lymphocytes Absolute: 1.1 10*3/uL (ref 0.7–3.1)
Lymphs: 23 %
MCH: 30.4 pg (ref 26.6–33.0)
MCHC: 33 g/dL (ref 31.5–35.7)
MCV: 92 fL (ref 79–97)
MONOS ABS: 0.5 10*3/uL (ref 0.1–0.9)
Monocytes: 12 %
NEUTROS ABS: 2.9 10*3/uL (ref 1.4–7.0)
NEUTROS PCT: 61 %
Platelets: 290 10*3/uL (ref 150–450)
RBC: 4.27 x10E6/uL (ref 4.14–5.80)
RDW: 12 % (ref 11.6–15.4)
WBC: 4.7 10*3/uL (ref 3.4–10.8)

## 2018-07-26 LAB — COMPREHENSIVE METABOLIC PANEL
ALBUMIN: 3.9 g/dL (ref 3.8–4.9)
ALT: 31 IU/L (ref 0–44)
AST: 18 IU/L (ref 0–40)
Albumin/Globulin Ratio: 1.3 (ref 1.2–2.2)
Alkaline Phosphatase: 128 IU/L — ABNORMAL HIGH (ref 39–117)
BUN/Creatinine Ratio: 13 (ref 9–20)
BUN: 12 mg/dL (ref 6–24)
Bilirubin Total: <0.2 mg/dL (ref 0.0–1.2)
CALCIUM: 9.2 mg/dL (ref 8.7–10.2)
CHLORIDE: 101 mmol/L (ref 96–106)
CO2: 27 mmol/L (ref 20–29)
Creatinine, Ser: 0.95 mg/dL (ref 0.76–1.27)
GFR calc non Af Amer: 92 mL/min/{1.73_m2} (ref >59)
GFR, EST AFRICAN AMERICAN: 107 mL/min/{1.73_m2} (ref >59)
GLOBULIN, TOTAL: 2.9 g/dL (ref 1.5–4.5)
Glucose: 75 mg/dL (ref 65–99)
POTASSIUM: 4.4 mmol/L (ref 3.5–5.2)
SODIUM: 142 mmol/L (ref 134–144)
TOTAL PROTEIN: 6.8 g/dL (ref 6.0–8.5)

## 2018-07-26 LAB — CARBAMAZEPINE LEVEL, TOTAL: Carbamazepine (Tegretol), S: 7.1 ug/mL (ref 4.0–12.0)

## 2018-07-26 LAB — LAMOTRIGINE LEVEL: Lamotrigine Lvl: 7.1 ug/mL (ref 2.0–20.0)

## 2018-07-28 ENCOUNTER — Telehealth: Payer: Self-pay

## 2018-07-28 NOTE — Telephone Encounter (Signed)
-----  Message from Larey Seat, MD sent at 07/28/2018  8:30 AM EST ----- All normal levels, therapeutic drug levels. Anemia has resolved. Alk phosphatase is not relevant in these 2 antiepileptic medications.

## 2018-07-28 NOTE — Telephone Encounter (Signed)
I contacted Shasaty at [facility]( pt's facility) and advised on lab results. She verbalized understanding and requested the report to be faxed to [phone]. Report formulated and faxed, confirmation received.

## 2018-10-04 ENCOUNTER — Other Ambulatory Visit: Payer: Self-pay | Admitting: Adult Health

## 2018-10-04 ENCOUNTER — Other Ambulatory Visit: Payer: Self-pay | Admitting: Neurology

## 2019-01-14 ENCOUNTER — Encounter (HOSPITAL_COMMUNITY): Payer: Self-pay

## 2019-01-14 ENCOUNTER — Other Ambulatory Visit: Payer: Self-pay

## 2019-01-14 ENCOUNTER — Emergency Department (HOSPITAL_COMMUNITY): Payer: Medicare Other

## 2019-01-14 ENCOUNTER — Emergency Department (HOSPITAL_COMMUNITY)
Admission: EM | Admit: 2019-01-14 | Discharge: 2019-01-25 | Disposition: E | Payer: Medicare Other | Attending: Emergency Medicine | Admitting: Emergency Medicine

## 2019-01-14 DIAGNOSIS — Z20828 Contact with and (suspected) exposure to other viral communicable diseases: Secondary | ICD-10-CM | POA: Diagnosis not present

## 2019-01-14 DIAGNOSIS — I469 Cardiac arrest, cause unspecified: Secondary | ICD-10-CM | POA: Diagnosis not present

## 2019-01-14 DIAGNOSIS — R109 Unspecified abdominal pain: Secondary | ICD-10-CM | POA: Diagnosis not present

## 2019-01-14 DIAGNOSIS — Z87891 Personal history of nicotine dependence: Secondary | ICD-10-CM | POA: Diagnosis not present

## 2019-01-14 DIAGNOSIS — R6520 Severe sepsis without septic shock: Secondary | ICD-10-CM | POA: Diagnosis not present

## 2019-01-14 DIAGNOSIS — A419 Sepsis, unspecified organism: Secondary | ICD-10-CM | POA: Insufficient documentation

## 2019-01-14 DIAGNOSIS — F79 Unspecified intellectual disabilities: Secondary | ICD-10-CM | POA: Insufficient documentation

## 2019-01-14 DIAGNOSIS — Z79899 Other long term (current) drug therapy: Secondary | ICD-10-CM | POA: Insufficient documentation

## 2019-01-14 DIAGNOSIS — R0600 Dyspnea, unspecified: Secondary | ICD-10-CM | POA: Diagnosis present

## 2019-01-14 HISTORY — DX: Gastric ulcer, unspecified as acute or chronic, without hemorrhage or perforation: K25.9

## 2019-01-14 LAB — CBC WITH DIFFERENTIAL/PLATELET
Abs Immature Granulocytes: 0.05 10*3/uL (ref 0.00–0.07)
Basophils Absolute: 0 10*3/uL (ref 0.0–0.1)
Basophils Relative: 0 %
Eosinophils Absolute: 0 10*3/uL (ref 0.0–0.5)
Eosinophils Relative: 0 %
HCT: 45.2 % (ref 39.0–52.0)
Hemoglobin: 13.5 g/dL (ref 13.0–17.0)
Immature Granulocytes: 1 %
Lymphocytes Relative: 10 %
Lymphs Abs: 1.1 10*3/uL (ref 0.7–4.0)
MCH: 31.1 pg (ref 26.0–34.0)
MCHC: 29.9 g/dL — ABNORMAL LOW (ref 30.0–36.0)
MCV: 104.1 fL — ABNORMAL HIGH (ref 80.0–100.0)
Monocytes Absolute: 0.7 10*3/uL (ref 0.1–1.0)
Monocytes Relative: 6 %
Neutro Abs: 8.9 10*3/uL — ABNORMAL HIGH (ref 1.7–7.7)
Neutrophils Relative %: 83 %
Platelets: 290 10*3/uL (ref 150–400)
RBC: 4.34 MIL/uL (ref 4.22–5.81)
RDW: 13.2 % (ref 11.5–15.5)
WBC: 10.8 10*3/uL — ABNORMAL HIGH (ref 4.0–10.5)
nRBC: 0 % (ref 0.0–0.2)

## 2019-01-14 LAB — URINALYSIS, ROUTINE W REFLEX MICROSCOPIC
Bilirubin Urine: NEGATIVE
Glucose, UA: NEGATIVE mg/dL
Hgb urine dipstick: NEGATIVE
Ketones, ur: NEGATIVE mg/dL
Leukocytes,Ua: NEGATIVE
Nitrite: NEGATIVE
Protein, ur: 30 mg/dL — AB
Specific Gravity, Urine: 1.027 (ref 1.005–1.030)
pH: 5 (ref 5.0–8.0)

## 2019-01-14 LAB — LIPASE, BLOOD: Lipase: 1484 U/L — ABNORMAL HIGH (ref 11–51)

## 2019-01-14 LAB — COMPREHENSIVE METABOLIC PANEL
ALT: 54 U/L — ABNORMAL HIGH (ref 0–44)
AST: 45 U/L — ABNORMAL HIGH (ref 15–41)
Albumin: 3.6 g/dL (ref 3.5–5.0)
Alkaline Phosphatase: 139 U/L — ABNORMAL HIGH (ref 38–126)
Anion gap: 15 (ref 5–15)
BUN: 14 mg/dL (ref 6–20)
CO2: 23 mmol/L (ref 22–32)
Calcium: 9.2 mg/dL (ref 8.9–10.3)
Chloride: 105 mmol/L (ref 98–111)
Creatinine, Ser: 1.19 mg/dL (ref 0.61–1.24)
GFR calc Af Amer: >60 mL/min (ref >60)
GFR calc non Af Amer: >60 mL/min (ref >60)
Glucose, Bld: 237 mg/dL — ABNORMAL HIGH (ref 70–99)
Potassium: 3.4 mmol/L — ABNORMAL LOW (ref 3.5–5.1)
Sodium: 143 mmol/L (ref 135–145)
Total Bilirubin: 0.3 mg/dL (ref 0.3–1.2)
Total Protein: 6.9 g/dL (ref 6.5–8.1)

## 2019-01-14 LAB — PROTIME-INR
INR: 1.1 (ref 0.8–1.2)
Prothrombin Time: 13.7 seconds (ref 11.4–15.2)

## 2019-01-14 LAB — SARS CORONAVIRUS 2 BY RT PCR (HOSPITAL ORDER, PERFORMED IN ~~LOC~~ HOSPITAL LAB): SARS Coronavirus 2: NEGATIVE

## 2019-01-14 LAB — LACTIC ACID, PLASMA: Lactic Acid, Venous: 4.3 mmol/L (ref 0.5–1.9)

## 2019-01-14 LAB — APTT: aPTT: 35 seconds (ref 24–36)

## 2019-01-14 MED ORDER — PIPERACILLIN-TAZOBACTAM 3.375 G IVPB 30 MIN
3.3750 g | Freq: Once | INTRAVENOUS | Status: DC
  Administered 2019-01-14: 3.375 g via INTRAVENOUS
  Filled 2019-01-14: qty 50

## 2019-01-14 MED ORDER — METRONIDAZOLE IN NACL 5-0.79 MG/ML-% IV SOLN: 500.0000 mg | Freq: Once | INTRAVENOUS | Status: DC

## 2019-01-14 MED ORDER — VANCOMYCIN HCL IN DEXTROSE 1-5 GM/200ML-% IV SOLN: 1000.0000 mg | Freq: Two times a day (BID) | INTRAVENOUS | Status: DC

## 2019-01-14 MED ORDER — SODIUM CHLORIDE 0.9 % IV BOLUS
1000.0000 mL | Freq: Once | INTRAVENOUS | Status: AC
  Administered 2019-01-14: 1000 mL via INTRAVENOUS

## 2019-01-14 MED ORDER — SODIUM CHLORIDE 0.9 % IV SOLN: 2.0000 g | Freq: Three times a day (TID) | INTRAVENOUS | Status: DC

## 2019-01-14 MED ORDER — VANCOMYCIN HCL IN DEXTROSE 1-5 GM/200ML-% IV SOLN: 1000.0000 mg | Freq: Once | INTRAVENOUS | Status: DC

## 2019-01-14 MED ORDER — SODIUM CHLORIDE 0.9 % IV BOLUS: 30.0000 mL/kg | Freq: Once | INTRAVENOUS | Status: DC

## 2019-01-14 MED ORDER — SODIUM CHLORIDE 0.9 % IV SOLN
2.0000 g | Freq: Once | INTRAVENOUS | Status: AC
  Administered 2019-01-14: 2 g via INTRAVENOUS
  Filled 2019-01-14: qty 2

## 2019-01-14 MED ORDER — VANCOMYCIN HCL 10 G IV SOLR
1500.0000 mg | Freq: Once | INTRAVENOUS | Status: DC
  Filled 2019-01-14: qty 1500

## 2019-01-14 MED ORDER — SODIUM CHLORIDE 0.9 % IV SOLN: 2.0000 g | Freq: Once | INTRAVENOUS | Status: DC

## 2019-01-14 MED ORDER — HYDROCORTISONE NA SUCCINATE PF 100 MG IJ SOLR
100.0000 mg | Freq: Once | INTRAMUSCULAR | Status: AC
  Administered 2019-01-14: 100 mg via INTRAVENOUS
  Filled 2019-01-14: qty 2

## 2019-01-15 LAB — URINE CULTURE: Culture: NO GROWTH

## 2019-01-15 MED FILL — Medication: Qty: 1 | Status: AC

## 2019-01-19 LAB — CULTURE, BLOOD (ROUTINE X 2)
Culture: NO GROWTH
Culture: NO GROWTH
Special Requests: ADEQUATE

## 2019-01-25 NOTE — ED Notes (Signed)
CPR paused, pulse check, pt asystole

## 2019-01-25 NOTE — ED Notes (Signed)
Pulse check, pt asystole, pulseless

## 2019-01-25 NOTE — ED Notes (Signed)
Epi x1 given IV

## 2019-01-25 NOTE — ED Triage Notes (Signed)
Pt brought by [facility] from home. Pt has MH/MR and has a caregiver. Caregiver told EMS that pt was gasping for air so he called EMS. Per EMS when they asked pt if he was having any pain, he pointed to his abdomen. Pt A&Ox2 to person and year. Pt is sinus tach on monitor

## 2019-01-25 NOTE — ED Notes (Signed)
Pulse check, pt agonal, pulsesless, asystole on zoll

## 2019-01-25 NOTE — ED Notes (Signed)
IO placed EDP- right shin

## 2019-01-25 NOTE — ED Notes (Signed)
Epi x1, asystole

## 2019-01-25 NOTE — ED Provider Notes (Signed)
[facility] DEPART[facility] Provider Note   CSN: [id number] Arrival date & time: 01-31-2019  1118     History   Chief Complaint Chief Complaint  Patient presents with  . Shortness of Breath    HPI [name] is a 53 y.o. male.     Patient with history of MR, traumatic brain injury, seizure disorder, GI bleeding, adrenal insufficiency-- presents with shortness of breath and abdominal pain.  Level V caveat due to MR.  Patient's caregiver at bedside states that patient was doing okay this morning.  At one point a couple of hours ago he developed gasping for air and appeared short of breath.  He was breathing rapidly.  EMS was called.  Patient was asked if he was in any pain and he pointed to his abdomen.  No vomiting today.  No urinary symptoms.  No recent diarrhea.  No known sick contacts.     Past Medical History:  Diagnosis Date  . Anxiety   . Bipolar disorder ([place])   . BPH (benign prostatic hyperplasia)   . Closed head injury   . Constipation   . Depression   . Esophageal ulcer   . Gastric ulcer   . Malnutrition ([place])   . 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 Peptic ulcer disease   . Seizures ([place])   . Smoker     Patient Active Problem List   Diagnosis Date Noted  . Acute GI bleeding 08/01/2016  . GI bleed 07/22/2016  . Thrombocytosis ([place]) 07/22/2016  . Acute blood loss anemia   . Ulcerative esophagitis   . Symptomatic anemia 07/21/2016  . Adrenal insufficiency (Addison's disease) ([place]) 06/13/2016  . Bipolar I disorder ([place]) 06/13/2016  . Allergic rhinitis 06/13/2016  . Slow transit constipation 06/13/2016  . Pneumothorax, right   . HAP (hospital-acquired pneumonia)   . Bilateral pulmonary infiltrates on chest x-ray   . Arterial hypotension   . HCAP (healthcare-associated pneumonia)   . Acute respiratory failure with hypoxemia ([place]) 04/30/2016  .  Anemia, iron deficiency 04/25/2016  . Altered mental status   . Aspiration pneumonia of right lower lobe due to gastric secretions ([place])   . Sepsis ([place]) 04/23/2016  . Pressure injury of skin 04/23/2016  . Community acquired pneumonia of right lower lobe of lung ([place]) 04/22/2016  . Absolute anemia 02/22/2015  . Closed head injury 02/22/2015  . 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 Anxiety state 02/22/2015  . 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 Depression 03/28/2007  . GERD with esophagitis 02/24/2007  . 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 ESOPHAGOGASTRODUODENOSCOPY  06/17/2011   Procedure: ESOPHAGOGASTRODUODENOSCOPY (EGD);  Surgeon: Wonda Horner, MD;  Location: [facility];  Service: Endoscopy;  Laterality: N/A;  . ESOPHAGOGASTRODUODENOSCOPY Left 08/02/2016   Procedure: ESOPHAGOGASTRODUODENOSCOPY (EGD);  Surgeon: Teena Irani, MD;  Location: [facility];  Service: Endoscopy;  Laterality: Left;  Marland Kitchen GASTRECTOMY          Home Medications    Prior to Admission medications   Medication Sig Start Date End Date Taking? Authorizing Provider  acetaminophen (TYLENOL) 500 MG tablet Take 500 mg by mouth every 6 (six) hours as needed.   Yes [provider]  benztropine (COGENTIN) 0.5 MG tablet Take 0.5 mg by mouth daily.   Yes [provider]  bismuth subsalicylate (PEPTO BISMOL) 262 MG/15ML  suspension Take 15 mLs by mouth every 6 (six) hours as needed for indigestion or diarrhea or loose stools.   Yes [provider]  carbamazepine (CARBATROL) 300 MG 12 hr capsule TAKE 1 CAPSULE BY MOUTH 2 TIMES DAILY Patient taking differently: Take 200 mg by mouth 3 (three) times daily.  10/05/18  Yes Kathrynn Ducking, MD  clonazePAM (KLONOPIN) 0.5 MG tablet TAKE ONE TABLET BY MOUTH TWICE DAILY AS NEEDED Patient taking differently: Take 0.5 mg by mouth at bedtime. Also as needed 10/05/18  Yes Kathrynn Ducking, MD  dimenhyDRINATE (DRAMAMINE) 50 MG tablet Take 50 mg by mouth every 8 (eight) hours as needed for nausea or dizziness.   Yes [provider]  diphenhydrAMINE (BENADRYL) 25 mg capsule Take 25 mg by mouth every 6 (six) hours as needed for allergies.   Yes [provider]  escitalopram (LEXAPRO) 20 MG tablet Take 20 mg by mouth daily.    Yes [provider]  famotidine (PEPCID) 20 MG tablet Take 1 tablet (20 mg total) by mouth 2 (two) times daily. Patient taking differently: Take 20 mg by mouth 2 (two) times daily as needed for heartburn.  04/29/17  Yes Carmon Sails J, PA-C  feeding supplement, ENSURE ENLIVE, (ENSURE ENLIVE) LIQD Take 237 mLs by mouth 2 (two) times daily between meals.   Yes [provider]  ferrous sulfate 325 (65 FE) MG tablet Take 1 tablet (325 mg total) by mouth 2 (two) times daily with a meal. 07/26/16  Yes Regalado, Belkys A, MD  guaiFENesin-dextromethorphan (ROBITUSSIN DM) 100-10 MG/5ML syrup Take 5 mLs by mouth every 4 (four) hours as needed for cough.   Yes [provider]  ibuprofen (ADVIL,MOTRIN) 200 MG tablet Take 200 mg by mouth every 6 (six) hours as needed.   Yes [provider]  LamoTRIgine 200 MG TB24 24 hour tablet TAKE 2 TABLETS BY MOUTH AT BEDTIME Patient taking differently: Take 400 mg by mouth at bedtime.  10/05/18  Yes Kathrynn Ducking, MD  loperamide (IMODIUM) 2 MG capsule Take 2 mg by mouth as needed for diarrhea or loose stools.   Yes [provider]  magnesium hydroxide (MILK OF MAGNESIA) 400 MG/5ML suspension Take 30 mLs by mouth daily as needed for mild constipation.   Yes [provider]  midodrine (PROAMATINE) 10 MG tablet Take 1 tablet (10 mg total) by mouth 3 (three) times daily with meals. 05/17/16  Yes Eber Jones, MD  Olopatadine HCl 0.2 % SOLN Place 1 drop into both eyes daily. 02/26/17  Yes [provider]  pantoprazole (PROTONIX) 40 MG tablet  Take 1 tablet (40 mg total) by mouth daily. Patient taking differently: Take 40 mg by mouth 2 (two) times daily.  04/29/17  Yes Carmon Sails J, PA-C  risperiDONE (RISPERDAL) 1 MG tablet Take 1 mg by mouth 2 (two) times daily. 02/26/17  Yes [provider]  sucralfate (CARAFATE) 1 GM/10ML suspension Take 10 mLs (1 g total) by mouth 4 (four) times daily -  with meals and at bedtime. 04/29/17  Yes Kinnie Feil, PA-C  traZODone (DESYREL) 50 MG tablet Take 50 mg by mouth at bedtime. 02/26/17  Yes [provider]  zolpidem (AMBIEN) 10 MG tablet Take 10 mg by mouth at bedtime.   Yes [provider]    Family History Family History  Family history unknown: Yes    Social History Social History   Tobacco Use  . Smoking status: Former Smoker  Packs/day: 0.50    Years: 27.00    Pack years: 13.50    Quit date: 05/26/2010    Years since quitting: 8.6  . Smokeless tobacco: Former Systems developer    Quit date: 06/08/2001  Substance Use Topics  . Alcohol use: No  . Drug use: No     Allergies   Pineapple   Review of Systems Review of Systems  Unable to perform ROS: Mental status change     Physical Exam Updated Vital Signs BP 108/81   Pulse (!) 121   Temp (!) 94.8 F (34.9 C) (Rectal)   Resp (!) 24   Ht 6' (1.829 m)   Wt 74.8 kg   SpO2 94%   BMI 22.38 kg/m   Physical Exam Vitals signs and nursing note reviewed.  Constitutional:      Appearance: He is well-developed.  HENT:     Head: Normocephalic and atraumatic.     Mouth/Throat:     Pharynx: Oropharynx is clear.  Eyes:     General:        Right eye: No discharge.        Left eye: No discharge.     Conjunctiva/sclera: Conjunctivae normal.  Neck:     Musculoskeletal: Normal range of motion and neck supple.  Cardiovascular:     Rate and Rhythm: Regular rhythm. Tachycardia present.     Heart sounds: Normal heart sounds.  Pulmonary:     Effort: Pulmonary effort is normal. Tachypnea present. No  respiratory distress.     Breath sounds: Rhonchi (scattered) present. No wheezing or rales.     Comments: Occasional cough/throat clearing during exam but protecting airway.  Abdominal:     Palpations: Abdomen is soft.     Tenderness: There is abdominal tenderness.     Comments: Generalized but difficult to localize given patient's baseline mental status  Skin:    General: Skin is dry.     Coloration: Skin is not cyanotic.     Findings: No erythema.  Neurological:     Mental Status: He is alert.     Comments: Soft, garbled speech, hard to understand.       ED Treatments / Results  Labs (all labs ordered are listed, but only abnormal results are displayed) Labs Reviewed  LACTIC ACID, PLASMA - Abnormal; Notable for the following components:      Result Value   Lactic Acid, Venous 4.3 (*)    All other components within normal limits  COMPREHENSIVE METABOLIC PANEL - Abnormal; Notable for the following components:   Potassium 3.4 (*)    Glucose, Bld 237 (*)    AST 45 (*)    ALT 54 (*)    Alkaline Phosphatase 139 (*)    All other components within normal limits  CBC WITH DIFFERENTIAL/PLATELET - Abnormal; Notable for the following components:   WBC 10.8 (*)    MCV 104.1 (*)    MCHC 29.9 (*)    Neutro Abs 8.9 (*)    All other components within normal limits  URINALYSIS, ROUTINE W REFLEX MICROSCOPIC - Abnormal; Notable for the following components:   Protein, ur 30 (*)    Bacteria, UA RARE (*)    All other components within normal limits  LIPASE, BLOOD - Abnormal; Notable for the following components:   Lipase 1,484 (*)    All other components within normal limits  CULTURE, BLOOD (ROUTINE X 2)  CULTURE, BLOOD (ROUTINE X 2)  URINE CULTURE  SARS CORONAVIRUS 2 (HOSPITAL ORDER,  [facility])  APTT  PROTIME-INR  LACTIC ACID, PLASMA  I-STAT ARTERIAL BLOOD GAS, ED    EKG None  Radiology No results found.  Procedures Procedures (including  critical care time)  Medications Ordered in ED Medications  sodium chloride 0.9 % bolus 2,244 mL (has no administration in time range)  vancomycin (VANCOCIN) 1,500 mg in sodium chloride 0.9 % 500 mL IVPB (has no administration in time range)  metroNIDAZOLE (FLAGYL) IVPB 500 mg (has no administration in time range)  vancomycin (VANCOCIN) IVPB 1000 mg/200 mL premix (has no administration in time range)  ceFEPIme (MAXIPIME) 2 g in sodium chloride 0.9 % 100 mL IVPB (has no administration in time range)  hydrocortisone sodium succinate (SOLU-CORTEF) 100 MG injection 100 mg (100 mg Intravenous Given February 07, 2019 1203)  sodium chloride 0.9 % bolus 1,000 mL (1,000 mLs Intravenous New Bag/Given 07-Feb-2019 1221)  sodium chloride 0.9 % bolus 1,000 mL (1,000 mLs Intravenous New Bag/Given 02-07-2019 1221)  ceFEPIme (MAXIPIME) 2 g in sodium chloride 0.9 % 100 mL IVPB (2 g Intravenous New Bag/Given February 07, 2019 1241)     Initial Impression / Assessment and Plan / ED Course  I have reviewed the triage vital signs and the nursing notes.  Pertinent labs & imaging results that were available during my care of the patient were reviewed by me and considered in my medical decision making (see chart for details).        Patient seen and examined.  Tachycardic/hypothermic concern for sepsis.  Blood pressure 108/71.  Awaiting initial labs and chest x-ray.  Hydrocortisone given h/o adrenal insufficiency. Pt d/w Dr. Sedonia Small who will see.   Vital signs reviewed and are as follows: BP 108/81   Pulse (!) 121   Temp (!) 94.8 F (34.9 C) (Rectal)   Resp (!) 24   Ht 6' (1.829 m)   Wt 74.8 kg   SpO2 94%   BMI 22.38 kg/m   12:25 PM Lactate > 4. Code Sepsis ordered.  Broad-spectrum antibiotics ordered.  Cover for abdominal process, PNA/aspiration while we continue to determine cause. 30cc/kg fluid bolus ordered. Dr. Sedonia Small has seen, agrees with plan.   1:22 PM Lipase noted. Continue aggressive fluid hydration. Pending CT  abd/pelvis. Creatinine okay.   1:34 PM Patient lost pulses and code blue initiated. Dr. Sedonia Small at bedside intubating patient. CPR in progress. See resuscitation note.   Final Clinical Impressions(s) / ED Diagnoses   Final diagnoses:  Severe sepsis ([place])   Pt expired in ED.   ED Discharge Orders    None       Carlisle Cater, Hershal Coria Feb 07, 2019 1415    Maudie Flakes, MD 01/17/19 1101

## 2019-01-25 NOTE — ED Notes (Signed)
Pt intubated

## 2019-01-25 NOTE — ED Notes (Signed)
Eye care complete.

## 2019-01-25 NOTE — ED Notes (Signed)
EDP called time of death- 1349

## 2019-01-25 NOTE — ED Notes (Addendum)
Epi x1, CPR continued, aystole

## 2019-01-25 NOTE — ED Notes (Signed)
Placed pt on Bair hugger

## 2019-01-25 NOTE — ED Provider Notes (Signed)
Patient was tachycardic and hypothermic on arrival, there is initial concern for sepsis, question of aspiration pneumonia versus intra-abdominal source.  Was given empiric antibiotic coverage he had poor response to IV fluid resuscitation.  Patient became mildly hypotensive with systolics in the 14N.  Norepinephrine drip was requested.  Soon after that I was called to bedside for cardiac arrest.  CPR in progress.  Patient was quickly given code dose epi, 1 mg.  There is clinical concern for severe aspiration event given the amount of vomitus in his airway that was making bag-valve-mask ventilation difficult.  Definitive airway was obtained as described below.  Patient's oxygenation was improved from 40s to 100s.  Prior to this event, patient had normal oxygen saturations and seemed to be protecting his airway.  CPR was performed for a total of 21 minutes, patient was in asystole during every pulse and rhythm check.  He received a total of 4 mg of epinephrine.  After 2 or 3 rounds of CPR with 100% oxygen saturation, bedside ultrasound was performed to evaluate for heart activity, and there was complete cardiac standstill.  Given this finding and the prolonged asystole, time of death was called at 1:49 PM.  Procedure Name: Intubation Date/Time: 09-Feb-2019 2:12 PM Performed by: Maudie Flakes, MD Pre-anesthesia Checklist: Suction available Oxygen Delivery Method: Ambu bag Preoxygenation: Pre-oxygenation with 100% oxygen Ventilation: Mask ventilation with difficulty Laryngoscope Size: Glidescope and 4 Grade View: Grade II Tube size: 7.5 mm Number of attempts: 2 Airway Equipment and Method: Rigid stylet Placement Confirmation: ETT inserted through vocal cords under direct vision and Positive ETCO2 Secured at: 22 cm Difficulty Due To: Difficulty was anticipated Comments: Difficult airway during CPR and in the setting o copious gastric contents spilling into the airway.  Initial attempt was with direct  laryngoscopy with a size 3 MAC blade.  The ET tube was thought to have been placed between the cords, however the ET tube then began to feel of gastric contents.  There was concern for esophageal intubation and so this ET tube was removed.  Second attempt with glide scope and was successful but again difficult due to vomitus in the airway.  This ET tube again filled with gastric contents.  In hindsight it appears that he had significant aspiration.  Suction was performed and the ET tube cleared.    IO LINE INSERTION  Date/Time: 2019/02/09 2:17 PM Performed by: Maudie Flakes, MD Authorized by: Maudie Flakes, MD   Consent:    Consent obtained:  Emergent situation Anesthesia (see MAR for exact dosages):    Anesthesia method:  None Procedure details:    Insertion site:  R proximal tibia   Insertion device:  Drill device   Insertion: Needle was inserted through the bony cortex     Number of attempts:  1   Insertion confirmation:  Easy infusion of fluids Post-procedure details:    Patient tolerance of procedure:  Tolerated well, no immediate complications  Procedure: CPR CPR was performed for a total of 21 minutes.  I personally coordinated CPR and coached effectiveness.  Critical Care Documentation Critical care time provided by me (excluding procedures): 33 minutes  Condition necessitating critical care: Sepsis, cardiac arrest  Components of critical care management: reviewing of prior records, laboratory and imaging interpretation, frequent re-examination and reassessment of vital signs, administration of IV fluids, IV antibiotics.      Maudie Flakes, MD 02-09-19 [id number]

## 2019-01-25 NOTE — Progress Notes (Signed)
Pharmacy Antibiotic Note  [name] is a 53 y.o. male admitted on January 28, 2019 with sepsis. Pharmacy has been consulted for vancomycin and cefepime dosing. Pt is hypothermic and WBC is slightly elevated. Pt SCr is above his baseline at 1.19. His lactic acid is also elevated.   Plan: Vancomycin 1500mg  IV x 1 then 1gm IV Q12H Cefepime 2gm IV Q8H F/u renal fxn, C&S, clinical status and peak/trough at SS  Height: 6' (182.9 cm) Weight: 165 lb (74.8 kg) IBW/kg (Calculated) : 77.6  Temp (24hrs), Avg:94.8 F (34.9 C), Min:94.8 F (34.9 C), Max:94.8 F (34.9 C)  Recent Labs  Lab 01-28-19 1141  WBC 10.8*  CREATININE 1.19  LATICACIDVEN 4.3*    Estimated Creatinine Clearance: 77.7 mL/min (by C-G formula based on SCr of 1.19 mg/dL).    Allergies  Allergen Reactions  . Pineapple Anaphylaxis    Antimicrobials this admission: Vanc 8/21>> Cefepime 8/21>> Flagyl x 1 8/21  Dose adjustments this admission: N/A  Microbiology results: Pending  Thank you for allowing pharmacy to be a part of this patient's care.  Rogers Ditter, Rande Lawman 01-28-2019 12:52 PM

## 2019-01-25 NOTE — ED Notes (Signed)
Pt taken to morgue. 

## 2019-01-25 NOTE — ED Notes (Signed)
[name] placed on pt

## 2019-01-25 NOTE — ED Notes (Signed)
Pt suctioned, CPR continued

## 2019-01-25 NOTE — Progress Notes (Signed)
Responded to ED page to support staff and patient.  Upon my arrival patient had coded and is now deceased. Patient has an legal guardiun per nurse who is at nurses station and is currently contacting patient family.  No direct intervention other than spoke with nurses regarding patient.  Chaplain available as needed if family arrives.  Jaclynn Major, [place], [facility], Pager [phone]

## 2019-01-25 NOTE — ED Notes (Signed)
EDP using [place] to detect cardiac activity, CPR continued

## 2019-01-25 NOTE — ED Notes (Signed)
Epi x1

## 2019-01-25 NOTE — ED Notes (Addendum)
At the request of EDP, this RN and Museum/gallery curator entered pt room. This RN found pt with agonal respirations, gurgling on insipiration, suction performed. Pt was found not on the monitor, pt placed on O2 sat which read 11%. Respiratory at bedside, pt placed on non-rebreather. Pt then ceased respirations, pulse check performed, no pulse, CPR initiated. Code Blue button pulled.

## 2019-01-25 DEATH — deceased

## 2019-07-28 ENCOUNTER — Ambulatory Visit: Payer: Medicare Other | Admitting: Neurology
